# Patient Record
Sex: Male | Born: 1945 | Race: White | Hispanic: No | Marital: Married | Smoking: Never smoker
Health system: Southern US, Community
[De-identification: ages and names within clinical notes are randomized; demographics above are authoritative.]

## PROBLEM LIST (undated history)

## (undated) DIAGNOSIS — G4733 Obstructive sleep apnea (adult) (pediatric): Secondary | ICD-10-CM

## (undated) DIAGNOSIS — I2510 Atherosclerotic heart disease of native coronary artery without angina pectoris: Secondary | ICD-10-CM

## (undated) DIAGNOSIS — Z87442 Personal history of urinary calculi: Secondary | ICD-10-CM

## (undated) DIAGNOSIS — R350 Frequency of micturition: Secondary | ICD-10-CM

## (undated) DIAGNOSIS — R7303 Prediabetes: Secondary | ICD-10-CM

## (undated) DIAGNOSIS — H9190 Unspecified hearing loss, unspecified ear: Secondary | ICD-10-CM

## (undated) DIAGNOSIS — IMO0001 Reserved for inherently not codable concepts without codable children: Secondary | ICD-10-CM

## (undated) DIAGNOSIS — M1990 Unspecified osteoarthritis, unspecified site: Secondary | ICD-10-CM

## (undated) DIAGNOSIS — Z860100 Personal history of colon polyps, unspecified: Secondary | ICD-10-CM

## (undated) DIAGNOSIS — R2681 Unsteadiness on feet: Secondary | ICD-10-CM

## (undated) DIAGNOSIS — I499 Cardiac arrhythmia, unspecified: Secondary | ICD-10-CM

## (undated) DIAGNOSIS — M51369 Other intervertebral disc degeneration, lumbar region without mention of lumbar back pain or lower extremity pain: Secondary | ICD-10-CM

## (undated) DIAGNOSIS — I10 Essential (primary) hypertension: Secondary | ICD-10-CM

## (undated) DIAGNOSIS — H269 Unspecified cataract: Secondary | ICD-10-CM

## (undated) DIAGNOSIS — R351 Nocturia: Secondary | ICD-10-CM

## (undated) DIAGNOSIS — R011 Cardiac murmur, unspecified: Secondary | ICD-10-CM

## (undated) DIAGNOSIS — K8050 Calculus of bile duct without cholangitis or cholecystitis without obstruction: Secondary | ICD-10-CM

## (undated) DIAGNOSIS — K219 Gastro-esophageal reflux disease without esophagitis: Secondary | ICD-10-CM

## (undated) DIAGNOSIS — K8020 Calculus of gallbladder without cholecystitis without obstruction: Secondary | ICD-10-CM

## (undated) DIAGNOSIS — I519 Heart disease, unspecified: Secondary | ICD-10-CM

## (undated) DIAGNOSIS — C801 Malignant (primary) neoplasm, unspecified: Secondary | ICD-10-CM

## (undated) DIAGNOSIS — M5136 Other intervertebral disc degeneration, lumbar region: Secondary | ICD-10-CM

## (undated) DIAGNOSIS — K831 Obstruction of bile duct: Secondary | ICD-10-CM

## (undated) DIAGNOSIS — Z9989 Dependence on other enabling machines and devices: Secondary | ICD-10-CM

## (undated) HISTORY — DX: Obstruction of bile duct: K83.1

## (undated) HISTORY — PX: JOINT REPLACEMENT: SHX530

## (undated) HISTORY — DX: Calculus of bile duct without cholangitis or cholecystitis without obstruction: K80.50

## (undated) HISTORY — DX: Personal history of colon polyps, unspecified: Z86.0100

## (undated) HISTORY — DX: Unsteadiness on feet: R26.81

## (undated) HISTORY — PX: OTHER SURGICAL HISTORY: SHX169

## (undated) HISTORY — PX: CARDIOVASCULAR STRESS TEST: SHX262

## (undated) HISTORY — DX: Personal history of colonic polyps: Z86.010

---
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
## 1970-02-02 HISTORY — PX: APPENDECTOMY: SHX54

## 1990-02-02 HISTORY — PX: OTHER SURGICAL HISTORY: SHX169

## 2001-02-02 HISTORY — PX: INGUINAL HERNIA REPAIR: SUR1180

## 2002-05-19 ENCOUNTER — Ambulatory Visit (HOSPITAL_COMMUNITY): Admission: RE | Admit: 2002-05-19 | Discharge: 2002-05-19 | Payer: Self-pay | Admitting: Gastroenterology

## 2003-02-03 HISTORY — PX: CYSTOSCOPY: SUR368

## 2004-02-03 HISTORY — PX: CERVICAL FUSION: SHX112

## 2007-01-13 ENCOUNTER — Ambulatory Visit (HOSPITAL_BASED_OUTPATIENT_CLINIC_OR_DEPARTMENT_OTHER): Admission: RE | Admit: 2007-01-13 | Discharge: 2007-01-13 | Payer: Self-pay | Admitting: Urology

## 2007-01-13 HISTORY — PX: OTHER SURGICAL HISTORY: SHX169

## 2007-01-17 ENCOUNTER — Ambulatory Visit (HOSPITAL_COMMUNITY): Admission: RE | Admit: 2007-01-17 | Discharge: 2007-01-17 | Payer: Self-pay | Admitting: Urology

## 2007-01-17 HISTORY — PX: EXTRACORPOREAL SHOCK WAVE LITHOTRIPSY: SHX1557

## 2007-02-14 ENCOUNTER — Ambulatory Visit (HOSPITAL_COMMUNITY): Admission: RE | Admit: 2007-02-14 | Discharge: 2007-02-14 | Payer: Self-pay | Admitting: Urology

## 2010-02-02 HISTORY — PX: NASAL SINUS SURGERY: SHX719

## 2010-06-17 NOTE — Op Note (Signed)
Spencer Cochran, Spencer Cochran              ACCOUNT NO.:  [id number]   MEDICAL RECORD NO.:  [id number]          PATIENT TYPE:  AMB   LOCATION:  DAY                          FACILITY:  [facility]   PHYSICIAN:  Sigmund I. Patsi Sears, M.D.DATE OF BIRTH:  08/14/1945   DATE OF PROCEDURE:  01/13/2007  DATE OF DISCHARGE:                               OPERATIVE REPORT   PREOPERATIVE DIAGNOSES:  1. Gross hematuria.  2. Dense urethral stricture.  3. Bladder neck elevation.   SURGEON:  Sigmund I. Patsi Sears, M.D.   ANESTHESIA:  General LMA.   OPERATION:  Cystourethroscopy, urethral dilation, transurethral incision  of the prostate.   PREPARATION:  After appropriate pre-anesthesia, the patient was brought  to the operating room and placed on the operating table in dorsal supine  position where general LMA anesthesia was introduced.  He was then  replaced in dorsal lithotomy position where the pubis was prepped with  Betadine solution and draped in the usual fashion.   REVIEW OF HISTORY:  This 65 year old male has a history of gross,  painless hematuria with clot formation.  Cystourethroscopy was  impossible because of the patient's dense urethral stricture.  He is  status post hypospadias repair as a child and has split meatus, with  submeatal stricture as well.  He is now for urethral dilation,  cystoscopy and incision of stricture.   DESCRIPTION OF PROCEDURE:  Inspection of the penis reveals that the  patient has split meatus as noted above.  There is definite stricture,  and I am unable to get a cystoscope into the strictured area.  Therefore, Sissy Hoff sounds are used to dilate the submeatal stricture  to a size 24 [place].  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  I am unable to traverse the bladder  neck into the bladder.  Therefore, a guidewire is passed through the  scope and I am  able to manipulate the wire into the bladder.  The scope  is removed and the Haymann dilators were then used to dilate the urethra  to a size 26 [place].  The 24 resectoscope was then placed in the bladder  and cystoscopy reveals no evidence of bladder stone or tumor, however,  there is markedly elevated prostate bladder neck.  This is in  association with bilobar BPH.  It was felt that the patient will  probably need to have TURP, but I will try bladder neck incision today,  and this is accomplished from the trigone to the veru.  Bleeding is  electrocoagulated and bladder is irrigated.  A  guidewire is removed.  A 22 catheter with 33 mL balloon is then placed  in the bladder with mild traction.  The patient was awakened and taken  to the recovery room in good condition.  I will discuss possible  complete TURP with the patient.      Sigmund I. Patsi Sears, M.D.  Electronically Signed     SIT/MEDQ  D:  01/13/2007  T:  01/13/2007  Job:  [id number]   cc:   Raynelle Jan, M.D.  Fax: [phone]

## 2010-06-20 NOTE — Op Note (Signed)
NAME:  Spencer Cochran, Spencer Cochran                          ACCOUNT NO.:  [id number]   MEDICAL RECORD NO.:  [id number]                   PATIENT TYPE:  AMB   LOCATION:  ENDO                                 FACILITY:  [facility]   PHYSICIAN:  Anselmo Rod, M.D.               DATE OF BIRTH:  03-16-1945   DATE OF PROCEDURE:  05/19/2002  DATE OF DISCHARGE:                                 OPERATIVE REPORT   PROCEDURE:  Colonoscopy with snare polypectomy x1.   ENDOSCOPIST:  Anselmo Rod, M.D.   INSTRUMENT USED:  Olympus video colonoscope.   INDICATION FOR PROCEDURE:  A 65 year old white male with a family history of  colon cancer in a maternal aunt and a personal history of adenomatous polyps  removed in the past, undergoing screening colonoscopy to rule out recurrent  polyps.   PREPROCEDURE PREPARATION:  Informed consent was procured from the patient.  The patient was fasted for eight hours prior to the procedure and prepped  with a bottle of magnesium citrate and a gallon of GoLYTELY the night prior  to the procedure.   PREPROCEDURE PHYSICAL:  VITAL SIGNS:  The patient had stable vital signs.  NECK:  Supple.  CHEST:  Clear to auscultation.  S1, S2 regular.  ABDOMEN:  Soft with normal bowel sounds.   DESCRIPTION OF PROCEDURE:  The patient was placed in the left lateral  decubitus position and sedated with 100 mg of Demerol and 10 mg of Versed  intravenously.  Once the patient was adequately sedate and maintained on low-  flow oxygen and continuous cardiac monitoring, the Olympus video colonoscope  was advanced from the rectum to the cecum with slight difficulty.  There was  some residual stool in the colon.  Multiple washes were done.  There was  scattered diverticulosis throughout the colon.  A small sessile polyp was  snared from 40 cm.  Small internal hemorrhoids were seen on retroflexion,  and no large masses were appreciated.  The appendiceal orifice and the  ileocecal valve were  clearly visualized and photographed.   IMPRESSION:  1. Scattered diverticulosis.  2. A small sessile polyp snared from 40 cm (polyp lost in stool).  3. Small, nonbleeding internal hemorrhoids.   RECOMMENDATIONS:  1. A high-fiber diet with liberal fluid intake has been advocated, 20-25 g     of fiber have been recommended in the diet along with liberal fluid     intake.  2. Repeat colorectal cancer screening is recommended in the next three years     unless the patient develops any     abnormal symptoms in the interim.  3. Avoid all nonsteroidals, including aspirin, for the next three to four     weeks.  4. Outpatient follow-up on an outpatient basis.  Anselmo Rod, M.D.    JNM/MEDQ  D:  05/19/2002  T:  05/19/2002  Job:  [id number]   cc:   Charlott Rakes, MD, [place], [place]

## 2010-10-23 LAB — BASIC METABOLIC PANEL
BUN: 13
Calcium: 9.2
Chloride: 107
Creatinine, Ser: 0.85
Potassium: 3.7
Sodium: 142

## 2010-11-10 LAB — POCT I-STAT 4, (NA,K, GLUC, HGB,HCT)
HCT: 45
Hemoglobin: 15.3
Operator id: 268271
Potassium: 3.7

## 2011-02-04 DIAGNOSIS — J320 Chronic maxillary sinusitis: Secondary | ICD-10-CM | POA: Diagnosis not present

## 2011-02-11 DIAGNOSIS — L821 Other seborrheic keratosis: Secondary | ICD-10-CM | POA: Diagnosis not present

## 2011-02-18 DIAGNOSIS — J320 Chronic maxillary sinusitis: Secondary | ICD-10-CM | POA: Diagnosis not present

## 2011-03-09 DIAGNOSIS — I10 Essential (primary) hypertension: Secondary | ICD-10-CM | POA: Diagnosis not present

## 2011-03-09 DIAGNOSIS — R0602 Shortness of breath: Secondary | ICD-10-CM | POA: Diagnosis not present

## 2011-03-09 DIAGNOSIS — E785 Hyperlipidemia, unspecified: Secondary | ICD-10-CM | POA: Diagnosis not present

## 2011-03-09 DIAGNOSIS — I2510 Atherosclerotic heart disease of native coronary artery without angina pectoris: Secondary | ICD-10-CM | POA: Diagnosis not present

## 2011-03-23 DIAGNOSIS — R21 Rash and other nonspecific skin eruption: Secondary | ICD-10-CM | POA: Diagnosis not present

## 2011-03-31 DIAGNOSIS — I2510 Atherosclerotic heart disease of native coronary artery without angina pectoris: Secondary | ICD-10-CM | POA: Diagnosis not present

## 2011-03-31 DIAGNOSIS — R42 Dizziness and giddiness: Secondary | ICD-10-CM | POA: Diagnosis not present

## 2011-03-31 HISTORY — PX: TRANSTHORACIC ECHOCARDIOGRAM: SHX275

## 2011-04-01 DIAGNOSIS — G4730 Sleep apnea, unspecified: Secondary | ICD-10-CM | POA: Diagnosis not present

## 2011-04-03 DIAGNOSIS — N529 Male erectile dysfunction, unspecified: Secondary | ICD-10-CM | POA: Diagnosis not present

## 2011-04-03 DIAGNOSIS — R972 Elevated prostate specific antigen [PSA]: Secondary | ICD-10-CM | POA: Diagnosis not present

## 2011-04-03 DIAGNOSIS — E291 Testicular hypofunction: Secondary | ICD-10-CM | POA: Diagnosis not present

## 2011-04-03 DIAGNOSIS — R3129 Other microscopic hematuria: Secondary | ICD-10-CM | POA: Diagnosis not present

## 2011-04-07 DIAGNOSIS — K8020 Calculus of gallbladder without cholecystitis without obstruction: Secondary | ICD-10-CM | POA: Diagnosis not present

## 2011-04-07 DIAGNOSIS — R3129 Other microscopic hematuria: Secondary | ICD-10-CM | POA: Diagnosis not present

## 2011-04-07 DIAGNOSIS — N200 Calculus of kidney: Secondary | ICD-10-CM | POA: Diagnosis not present

## 2011-04-29 DIAGNOSIS — I2510 Atherosclerotic heart disease of native coronary artery without angina pectoris: Secondary | ICD-10-CM | POA: Diagnosis not present

## 2011-04-29 DIAGNOSIS — E785 Hyperlipidemia, unspecified: Secondary | ICD-10-CM | POA: Diagnosis not present

## 2011-04-29 DIAGNOSIS — R0602 Shortness of breath: Secondary | ICD-10-CM | POA: Diagnosis not present

## 2011-04-29 DIAGNOSIS — I10 Essential (primary) hypertension: Secondary | ICD-10-CM | POA: Diagnosis not present

## 2011-05-12 DIAGNOSIS — H35039 Hypertensive retinopathy, unspecified eye: Secondary | ICD-10-CM | POA: Diagnosis not present

## 2011-05-12 DIAGNOSIS — H43819 Vitreous degeneration, unspecified eye: Secondary | ICD-10-CM | POA: Diagnosis not present

## 2011-05-12 DIAGNOSIS — H2510 Age-related nuclear cataract, unspecified eye: Secondary | ICD-10-CM | POA: Diagnosis not present

## 2011-05-12 DIAGNOSIS — H4011X0 Primary open-angle glaucoma, stage unspecified: Secondary | ICD-10-CM | POA: Diagnosis not present

## 2011-05-18 DIAGNOSIS — Z0000 Encounter for general adult medical examination without abnormal findings: Secondary | ICD-10-CM | POA: Diagnosis not present

## 2011-05-29 DIAGNOSIS — R3129 Other microscopic hematuria: Secondary | ICD-10-CM | POA: Diagnosis not present

## 2011-05-29 DIAGNOSIS — N35919 Unspecified urethral stricture, male, unspecified site: Secondary | ICD-10-CM | POA: Diagnosis not present

## 2011-05-29 DIAGNOSIS — N200 Calculus of kidney: Secondary | ICD-10-CM | POA: Diagnosis not present

## 2011-05-29 DIAGNOSIS — M549 Dorsalgia, unspecified: Secondary | ICD-10-CM | POA: Diagnosis not present

## 2011-06-01 DIAGNOSIS — G4733 Obstructive sleep apnea (adult) (pediatric): Secondary | ICD-10-CM | POA: Diagnosis not present

## 2011-06-01 DIAGNOSIS — M159 Polyosteoarthritis, unspecified: Secondary | ICD-10-CM | POA: Diagnosis not present

## 2011-06-02 ENCOUNTER — Other Ambulatory Visit: Payer: Self-pay | Admitting: Urology

## 2011-06-02 DIAGNOSIS — E291 Testicular hypofunction: Secondary | ICD-10-CM | POA: Diagnosis not present

## 2011-06-09 ENCOUNTER — Encounter (HOSPITAL_BASED_OUTPATIENT_CLINIC_OR_DEPARTMENT_OTHER): Payer: Self-pay | Admitting: *Deleted

## 2011-06-10 ENCOUNTER — Encounter (HOSPITAL_BASED_OUTPATIENT_CLINIC_OR_DEPARTMENT_OTHER): Payer: Self-pay | Admitting: *Deleted

## 2011-06-10 NOTE — Progress Notes (Signed)
NPO AFTER MN. ARRIVES AT 0845. NEEDS ISTAT. CURRENT EKG, LAST NOTE , ECHO, AND STRESS TEST TO BE FAXED FROM DR Bing Matter [phone]). WILL TAKE LIPITOR, PRILOSEC, AND COREG AM OF SURG. W/ SIP OF WATER.

## 2011-06-12 ENCOUNTER — Encounter (HOSPITAL_BASED_OUTPATIENT_CLINIC_OR_DEPARTMENT_OTHER): Payer: Self-pay | Admitting: *Deleted

## 2011-06-12 ENCOUNTER — Encounter (HOSPITAL_BASED_OUTPATIENT_CLINIC_OR_DEPARTMENT_OTHER)
Admission: RE | Disposition: A | Discharge status: Home / Self Care | Payer: Self-pay | Source: Ambulatory Visit | Attending: Urology

## 2011-06-12 ENCOUNTER — Encounter (HOSPITAL_BASED_OUTPATIENT_CLINIC_OR_DEPARTMENT_OTHER): Payer: Self-pay | Admitting: Anesthesiology

## 2011-06-12 ENCOUNTER — Ambulatory Visit (HOSPITAL_BASED_OUTPATIENT_CLINIC_OR_DEPARTMENT_OTHER)
Admission: RE | Admit: 2011-06-12 | Discharge: 2011-06-12 | Disposition: A | Discharge status: Home / Self Care | Payer: Medicare Other | Source: Ambulatory Visit | Attending: Urology | Admitting: Urology

## 2011-06-12 ENCOUNTER — Ambulatory Visit (HOSPITAL_BASED_OUTPATIENT_CLINIC_OR_DEPARTMENT_OTHER): Payer: Medicare Other | Admitting: Anesthesiology

## 2011-06-12 DIAGNOSIS — Z7982 Long term (current) use of aspirin: Secondary | ICD-10-CM | POA: Insufficient documentation

## 2011-06-12 DIAGNOSIS — E7800 Pure hypercholesterolemia, unspecified: Secondary | ICD-10-CM | POA: Diagnosis not present

## 2011-06-12 DIAGNOSIS — I10 Essential (primary) hypertension: Secondary | ICD-10-CM | POA: Diagnosis not present

## 2011-06-12 DIAGNOSIS — N35919 Unspecified urethral stricture, male, unspecified site: Secondary | ICD-10-CM | POA: Diagnosis not present

## 2011-06-12 DIAGNOSIS — Z79899 Other long term (current) drug therapy: Secondary | ICD-10-CM | POA: Diagnosis not present

## 2011-06-12 DIAGNOSIS — G4730 Sleep apnea, unspecified: Secondary | ICD-10-CM | POA: Diagnosis not present

## 2011-06-12 DIAGNOSIS — R3129 Other microscopic hematuria: Secondary | ICD-10-CM | POA: Insufficient documentation

## 2011-06-12 DIAGNOSIS — E669 Obesity, unspecified: Secondary | ICD-10-CM | POA: Diagnosis not present

## 2011-06-12 DIAGNOSIS — Z87442 Personal history of urinary calculi: Secondary | ICD-10-CM | POA: Insufficient documentation

## 2011-06-12 HISTORY — DX: Frequency of micturition: R35.0

## 2011-06-12 HISTORY — DX: Unspecified cataract: H26.9

## 2011-06-12 HISTORY — DX: Nocturia: R35.1

## 2011-06-12 HISTORY — DX: Dependence on other enabling machines and devices: Z99.89

## 2011-06-12 HISTORY — DX: Reserved for inherently not codable concepts without codable children: IMO0001

## 2011-06-12 HISTORY — DX: Gastro-esophageal reflux disease without esophagitis: K21.9

## 2011-06-12 HISTORY — PX: CYSTOSCOPY W/ RETROGRADES: SHX1426

## 2011-06-12 HISTORY — DX: Essential (primary) hypertension: I10

## 2011-06-12 HISTORY — DX: Unspecified osteoarthritis, unspecified site: M19.90

## 2011-06-12 HISTORY — DX: Other intervertebral disc degeneration, lumbar region: M51.36

## 2011-06-12 HISTORY — DX: Atherosclerotic heart disease of native coronary artery without angina pectoris: I25.10

## 2011-06-12 HISTORY — DX: Calculus of gallbladder without cholecystitis without obstruction: K80.20

## 2011-06-12 HISTORY — DX: Obstructive sleep apnea (adult) (pediatric): G47.33

## 2011-06-12 HISTORY — DX: Heart disease, unspecified: I51.9

## 2011-06-12 HISTORY — DX: Other intervertebral disc degeneration, lumbar region without mention of lumbar back pain or lower extremity pain: M51.369

## 2011-06-12 HISTORY — DX: Cardiac murmur, unspecified: R01.1

## 2011-06-12 HISTORY — DX: Unspecified hearing loss, unspecified ear: H91.90

## 2011-06-12 LAB — POCT I-STAT 4, (NA,K, GLUC, HGB,HCT)
Hemoglobin: 13.6 g/dL (ref 13.0–17.0)
Potassium: 3.8 mEq/L (ref 3.5–5.1)
Sodium: 148 mEq/L — ABNORMAL HIGH (ref 135–145)

## 2011-06-12 SURGERY — CYSTOSCOPY, WITH RETROGRADE PYELOGRAM
Anesthesia: General | Site: Urethra | Wound class: Clean Contaminated

## 2011-06-12 MED ORDER — SODIUM CHLORIDE 0.9 % IR SOLN
Status: DC | PRN
  Administered 2011-06-12: 3000 mL

## 2011-06-12 MED ORDER — TRAMADOL-ACETAMINOPHEN 37.5-325 MG PO TABS: 1.0000 | ORAL_TABLET | Freq: Four times a day (QID) | ORAL | Status: AC | PRN

## 2011-06-12 MED ORDER — ACETAMINOPHEN 10 MG/ML IV SOLN
INTRAVENOUS | Status: DC | PRN
  Administered 2011-06-12: 1000 mg via INTRAVENOUS

## 2011-06-12 MED ORDER — ONDANSETRON HCL 4 MG/2ML IJ SOLN
INTRAMUSCULAR | Status: DC | PRN
  Administered 2011-06-12: 4 mg via INTRAVENOUS

## 2011-06-12 MED ORDER — MEPERIDINE HCL 25 MG/ML IJ SOLN: 6.2500 mg | INTRAMUSCULAR | Status: DC | PRN

## 2011-06-12 MED ORDER — LIDOCAINE HCL (CARDIAC) 20 MG/ML IV SOLN
INTRAVENOUS | Status: DC | PRN
  Administered 2011-06-12: 80 mg via INTRAVENOUS

## 2011-06-12 MED ORDER — PHENAZOPYRIDINE HCL 200 MG PO TABS: 200.0000 mg | ORAL_TABLET | Freq: Three times a day (TID) | ORAL | Status: AC | PRN

## 2011-06-12 MED ORDER — LACTATED RINGERS IV SOLN: INTRAVENOUS | Status: DC

## 2011-06-12 MED ORDER — FENTANYL CITRATE 0.05 MG/ML IJ SOLN: 25.0000 ug | INTRAMUSCULAR | Status: DC | PRN

## 2011-06-12 MED ORDER — PROMETHAZINE HCL 25 MG/ML IJ SOLN: 6.2500 mg | INTRAMUSCULAR | Status: DC | PRN

## 2011-06-12 MED ORDER — IOHEXOL 350 MG/ML SOLN
INTRAVENOUS | Status: DC | PRN
  Administered 2011-06-12: 50 mL

## 2011-06-12 MED ORDER — PHENAZOPYRIDINE HCL 200 MG PO TABS
200.0000 mg | ORAL_TABLET | Freq: Three times a day (TID) | ORAL | Status: DC
  Administered 2011-06-12: 200 mg via ORAL

## 2011-06-12 MED ORDER — CEFAZOLIN SODIUM-DEXTROSE 2-3 GM-% IV SOLR
2.0000 g | INTRAVENOUS | Status: AC
  Administered 2011-06-12: 2 g via INTRAVENOUS

## 2011-06-12 MED ORDER — FENTANYL CITRATE 0.05 MG/ML IJ SOLN
INTRAMUSCULAR | Status: DC | PRN
  Administered 2011-06-12 (×2): 25 ug via INTRAVENOUS
  Administered 2011-06-12: 50 ug via INTRAVENOUS

## 2011-06-12 MED ORDER — PROPOFOL 10 MG/ML IV EMUL
INTRAVENOUS | Status: DC | PRN
  Administered 2011-06-12: 300 mg via INTRAVENOUS

## 2011-06-12 MED ORDER — LACTATED RINGERS IV SOLN
INTRAVENOUS | Status: DC
  Administered 2011-06-12 (×3): via INTRAVENOUS

## 2011-06-12 MED ORDER — LIDOCAINE HCL 2 % EX GEL
CUTANEOUS | Status: DC | PRN
  Administered 2011-06-12: 1 via URETHRAL

## 2011-06-12 MED ORDER — CEFAZOLIN SODIUM 1-5 GM-% IV SOLN: 1.0000 g | INTRAVENOUS | Status: DC

## 2011-06-12 SURGICAL SUPPLY — 20 items
ADAPTER CATH URET PLST 4-6FR (CATHETERS) ×2 IMPLANT
BAG DRAIN URO-CYSTO SKYTR STRL (DRAIN) ×2 IMPLANT
BOOTIES KNEE HIGH SLOAN (MISCELLANEOUS) ×2 IMPLANT
CANISTER SUCT LVC 12 LTR MEDI- (MISCELLANEOUS) ×2 IMPLANT
CATH INTERMIT  6FR 70CM (CATHETERS) ×2 IMPLANT
CLOTH BEACON ORANGE TIMEOUT ST (SAFETY) ×2 IMPLANT
DRAPE CAMERA CLOSED 9X96 (DRAPES) ×2 IMPLANT
GLOVE BIO SURGEON STRL SZ7.5 (GLOVE) ×2 IMPLANT
GLOVE BIOGEL PI IND STRL 7.0 (GLOVE) ×1 IMPLANT
GLOVE BIOGEL PI INDICATOR 7.0 (GLOVE) ×1
GLOVE ECLIPSE 7.0 STRL STRAW (GLOVE) ×2 IMPLANT
GOWN STRL REIN XL XLG (GOWN DISPOSABLE) ×2 IMPLANT
GOWN SURGICAL LARGE (GOWNS) ×2 IMPLANT
GOWN XL W/COTTON TOWEL STD (GOWNS) ×2 IMPLANT
GUIDEWIRE 0.038 PTFE COATED (WIRE) IMPLANT
GUIDEWIRE ANG ZIPWIRE 038X150 (WIRE) IMPLANT
GUIDEWIRE STR DUAL SENSOR (WIRE) ×2 IMPLANT
NS IRRIG 500ML POUR BTL (IV SOLUTION) IMPLANT
PACK CYSTOSCOPY (CUSTOM PROCEDURE TRAY) ×2 IMPLANT
WATER STERILE IRR 3000ML UROMA (IV SOLUTION) ×2 IMPLANT

## 2011-06-12 NOTE — Discharge Instructions (Addendum)
  Post Anesthesia Home Care Instructions  Activity: Get plenty of rest for the remainder of the day. A responsible adult should stay with you for 24 hours following the procedure.  For the next 24 hours, DO NOT: -Drive a car -Advertising copywriter -Drink alcoholic beverages -Take any medication unless instructed by your physician -Make any legal decisions or sign important papers.  Meals: Start with liquid foods such as gelatin or soup. Progress to regular foods as tolerated. Avoid greasy, spicy, heavy foods. If nausea and/or vomiting occur, drink only clear liquids until the nausea and/or vomiting subsides. Call your physician if vomiting continues.  Special Instructions/Symptoms: Your throat may feel dry or sore from the anesthesia or the breathing tube placed in your throat during surgery. If this causes discomfort, gargle with warm salt water. The discomfort should disappear within 24 hours.  Meatal Stenosis Meatal stenosis is a condition where the opening (meatus) on the end of the penis is too small (stenosis). Even if your child has no present symptoms, it is necessary to correct this problem. Over a period of time, the kidneys can be injured from back pressure if this is not corrected. The surgery to correct this is a simple procedure. It is often done in the caregiver's office. The surgery consists of making a tiny incision (cut by the surgeon) on the bottom of the tip of the penis. This is associated with a very little momentary discomfort, but little post-operative pain.  HOME CARE INSTRUCTIONS  Following the procedure, put petroleum jelly on the incision four times per day. The incision should be gently pulled apart. This is absolutely necessary so the incision can not grow back together again. This may cause a very minimal discomfort, so do not be alarmed if your baby cries. Only give your child over-the-counter or prescription medicines for pain, discomfort, or fever as directed by their  caregiver.  SEEK IMMEDIATE MEDICAL CARE IF:   The penis becomes red, swollen, or has a purulent (pus like) drainage.   There is continued bleeding that cannot be stopped with gentle pressure.   Your baby runs an unexplained temperature.   Your baby does not wet his or her diapers the same as always.  Document Released: 01/17/2000 Document Revised: 01/08/2011 Document Reviewed: 12/02/2007 [facility] Patient Information 2012 [place], [place].

## 2011-06-12 NOTE — Progress Notes (Signed)
Dr Patsi Sears paged regarding continued bleeding from meatus-voids well (250-200 cc) each time.States expected due to dilation-OK to d/c home.

## 2011-06-12 NOTE — Anesthesia Procedure Notes (Signed)
Procedure Name: LMA Insertion Date/Time: 06/12/2011 10:53 AM Performed by: Fran Lowes Pre-anesthesia Checklist: Patient identified, Emergency Drugs available, Suction available and Patient being monitored Patient Re-evaluated:Patient Re-evaluated prior to inductionOxygen Delivery Method: Circle System Utilized Preoxygenation: Pre-oxygenation with 100% oxygen Intubation Type: IV induction Ventilation: Mask ventilation without difficulty LMA: LMA inserted LMA Size: 5.0 Number of attempts: 3 Airway Equipment and Method: bite block Placement Confirmation: positive ETCO2 Tube secured with: Tape Dental Injury: Teeth and Oropharynx as per pre-operative assessment  Comments: 1st LMA Supreme #4, 2nd reg #4 LMA, 3rd. #5 reg LMA

## 2011-06-12 NOTE — H&P (Signed)
Active Problems Problems  1. Microscopic Hematuria 599.72 2. Multiple Renal Cysts 593.2 3. Nephrolithiasis 592.0  History of Present Illness        Mr. Spencer Cochran, 66 WM returns today for cystoscopy for hx of microhematuria with a hx of kidney stones, ED, hypogonadism, and elevated PSA. Has spontanously passed numerous stones over past. Denies fever/chills or nausea/vomiting. Patient is post-op cystoscopy, uretheral dilatation and TUIP 01/12/07. He is having ED, with partial erections. He is on Testim 1% with better energy. He had side effect with Viagra ( blue vision and headaches, and Levitra-severe headaches).  04/03/11  CT hematuria protocol - bilateral kidney stones  04/03/11 labs:  PSA - 3.97, Testosterone - 368.19, Creatinine - 0.89 and GFR - 89.  12/18/09 labs:  PSA - 2.90 and Testosterone - 277.39     11/15/08 labs:  PSA 3.20 and Vit D  49.5   Past Medical History Problems  1. History of  Acute Bacterial Prostatitis 601.0 2. History of  Acute Cystitis 595.0 3. History of  Adult Sleep Apnea 780.57 4. History of  Arthritis V13.4 5. History of  Dysuria 788.1 6. 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 History of  Hypercholesterolemia 272.0 8. History of  Hypertension 401.9 9. History of  Murmurs 785.2  Surgical History Problems  1. History of  Appendectomy 2. History of  Cystoscopy (Diagnostic) 3. History of  Inguinal Hernia Repair 4. History of  Lithotripsy Left 5. History of  Primary Repair Of Ruptured Achilles Tendon Left 6. History of  Transurethral Incision Of Prostate 7. History of  Urethra Surgery  Current Meds 1. Aspirin 81 MG Oral Tablet; Therapy: (Recorded:06May2008) to 2. Carvedilol 25 MG Oral Tablet; Therapy: (Recorded:01Mar2013) to 3. Fish Oil CAPS; Therapy: (Recorded:14Oct2010) to 4. Furosemide 40 MG Oral Tablet; Therapy: (Recorded:16Nov2011) to 5. Levitra 20 MG Oral Tablet; Take as directed; Therapy: 26Oct2009 to (Evaluate:16Feb2009); Last  Rx:26Oct2009 6.  Lipitor 40 MG Oral Tablet; Therapy: (Recorded:01Mar2013) to 7. Lotrel 5-40 MG Oral Capsule; Therapy: (Recorded:01Mar2013) to 8. Multi-Vitamin Oral Tablet; Therapy: (Recorded:06May2008) to 9. Omeprazole 20 MG Oral Capsule Delayed Release; Therapy: (Recorded:18Dec2007) to 10. Vitamin C 500 MG Oral Tablet; Therapy: (Recorded:01Mar2013) to 11. Vitamin D 11914 UNIT CAPS; TAKE 1 CAPSULE WEEKLY; Therapy: 18Aug2009 to (Last   Rx:18Aug2009)  Allergies Medication  1. No Known Drug Allergies  Family History Problems  1. Paternal history of  Aneurysm Of The Abdominal Aorta 2. Paternal history of  Bladder Cancer V16.52 3. Family history of  Family Health Status Number Of Children 1 son, 1 daughter 4. Paternal history of  Renal Failure 5. Paternal history of  Urologic Disorder V18.7 Blood in urine  Social History Problems  1. Family history of  Death In The Family Father Age 56 2. Family history of  Death In The Family Mother Liver cancer 3. Marital History - Currently Married 4. Never A Smoker 5. Occupation: Public relations account executive Denied  6. Alcohol Use 7. Caffeine Use 8. Tobacco Use V15.82  Review of Systems Genitourinary, constitutional, skin, eye, otolaryngeal, hematologic/lymphatic, cardiovascular, pulmonary, endocrine, musculoskeletal, gastrointestinal, neurological and psychiatric system(s) were reviewed and pertinent findings if present are noted.  Genitourinary: urinary frequency, feelings of urinary urgency, nocturia, weak urinary stream, incomplete emptying of bladder and hematuria.  ENT: sinus problems.    Vitals Vital Signs [Data Includes: Last 1 Day]  26Apr2013 03:58PM  Blood Pressure: 124 / 73 Temperature: 98.1 F Heart Rate: 73  Results/Data Urine [Data Includes: Last 1 Day]   26Apr2013  COLOR YELLOW  APPEARANCE CLEAR   SPECIFIC GRAVITY 1.020   pH 6.0   GLUCOSE NEG mg/dL  BILIRUBIN NEG   KETONE NEG mg/dL  BLOOD TRACE   PROTEIN NEG mg/dL  UROBILINOGEN 0.2  mg/dL  NITRITE NEG   LEUKOCYTE ESTERASE NEG   SQUAMOUS EPITHELIAL/HPF RARE   WBC NONE SEEN WBC/hpf  RBC 0-3 RBC/hpf  BACTERIA NONE SEEN   CRYSTALS NONE SEEN   CASTS NONE SEEN   Selected Results  AU CT-HEMATURIA PROTOCOL 05Mar2013 12:00AM Spencer Cochran   Test Name Result Flag Reference  ** RADIOLOGY REPORT BY [facility] ** ORIGINAL APPROVED BY: Consuello Bossier, M.D. ON: 04/07/2011 14:14:04   *RADIOLOGY REPORT*  Clinical Data: Microscopic hematuria. No abdominal complaints. History renal stones, prior lithotripsy.  CT ABDOMEN AND PELVIS WITHOUT AND WITH CONTRAST  Technique: Multidetector CT imaging of the abdomen and pelvis was performed without contrast material in one or both body regions, followed by contrast material(s) and further sections in one or both body regions.  Contrast: 125 ml Isovue 300  Comparison: Stone study 12/25/2008.  Findings: Unenhanced images demonstrate bilateral renal collecting system calculi. Largest is in the lower pole left kidney and measures 4 mm. Similar stone burden to the 12/25/2008 study.  No hydroureter or ureteric calculi.  Post contrast images demonstrate clear lung bases. Borderline cardiomegaly, with multivessel coronary artery atherosclerosis. Tiny hiatal hernia.  Hepatic dome cyst. Scattered too small to characterize hepatic lesions which are most likely tiny cysts. Remote granulomatous disease in the spleen.  Normal stomach. Fatty replaced pancreas. Duodenal diverticulum. Cholelithiasis without cholecystitis or biliary ductal dilatation. Normal adrenal glands. Bilateral renal cysts. No suspicious renal lesion on portal venous phase imaging.  Delayed images demonstrate moderate bilateral renal collecting system opacification. The right ureter is incompletely opacified, despite 2 attempts. No filling defects are identified.  No retroperitoneal or retrocrural adenopathy.  Scattered colonic diverticula.  Normal terminal ileum. Stable ileocolic mesenteric node on image 42, likely reactive. Small bowel otherwise normal. No ascites. Fat containing left inguinal hernia. No pelvic adenopathy. Normal urinary bladder. Mild prostatomegaly. No significant free fluid. No bladder filling defect on well opacified delayed images. No acute osseous abnormality.  IMPRESSION:  1. Bilateral nonobstructive renal calculi. 2. Otherwise, no explanation for hematuria. 3. Mildly suboptimal opacification of the right ureter on delayed images.  4. Cholelithiasis. 5. Prostatomegaly.   CREATININE with eGFR 01Mar2013 02:07PM Spencer Cochran  SPECIMEN TYPE: BLOOD   Test Name Result Flag Reference  CREATININE 0.89 mg/dL  1.61-0.96  Est GFR, African American >89 mL/min    Est GFR, NonAfrican American 89 mL/min    The estimated GFR is a calculation valid for adults (87 to 67 years old) that uses the CKD-EPI algorithm to adjust for age and sex. It is not to be used for children, pregnant women, hospitalized patients, patients on dialysis, or with rapidly changing kidney function. According to the NKDEP, eGFR >89 is normal, 60-89 shows mild impairment, 30-59 shows moderate impairment, 15-29 shows severe impairment and <15 is ESRD.   PSA REFLEX TO FREE 01Mar2013 02:05PM Spencer Cochran  SPECIMEN TYPE: BLOOD   Test Name Result Flag Reference  PSA 3.97 ng/mL  <=4.00  Test Methodology: ECLIA PSA (Electrochemiluminescence Immunoassay)   Procedure  Procedure: Cystoscopy  Indication: Hematuria. Lower Urinary Tract Symptoms.  Informed Consent: Risks, benefits, and potential adverse events were discussed and informed consent was obtained from the patient.  Prep: The patient was prepped with betadine.  Anesthesia:. Local anesthesia was administered intraurethrally with 2%  lidocaine jelly.  Antibiotic prophylaxis: Ciprofloxacin.  Procedure Note:  Urethral meatus:. A pinpoint stricture was present at the  urethral meatus but was not manipulated.  Unable to complete bladder visulization  Bladder:    Assessment Assessed  1. Backache 724.5 2. Urethral Stricture 598.9 3. Microscopic Hematuria 599.72   Microhematuria, wit epispadius and hx of multiple failed attempts at urethral reconstruction at [facility] [phone]). Had foreskin for graft. Ct shows bilateral nephrolithiasis. Cannot see R ureter. Will need retrograde pyelograms.   Plan Health Maintenance (V70.0)  1. UA With REFLEX  Done: 26Apr2013 03:29PM   cysto and dilationof stricture and Right retrograde pyelogram.   Amendment  RTC post op for follow-up. T  Amended By: Spencer Cochran; 06/03/2011 6:02 PMEST   Signatures Electronically signed by : Spencer Cochran, M.D.; Jun 03 2011  6:04PM

## 2011-06-12 NOTE — Anesthesia Postprocedure Evaluation (Signed)
  Anesthesia Post-op Note  Patient: Spencer Cochran  Procedure(s) Performed: Procedure(s) (LRB): CYSTOSCOPY WITH RETROGRADE PYELOGRAM (N/A)  Patient Location: PACU  Anesthesia Type: General  Level of Consciousness: awake and alert   Airway and Oxygen Therapy: Patient Spontanous Breathing  Post-op Pain: mild  Post-op Assessment: Post-op Vital signs reviewed, Patient's Cardiovascular Status Stable, Respiratory Function Stable, Patent Airway and No signs of Nausea or vomiting  Post-op Vital Signs: stable  Complications: No apparent anesthesia complications

## 2011-06-12 NOTE — Anesthesia Preprocedure Evaluation (Addendum)
Anesthesia Evaluation  Patient identified by MRN, date of birth, ID band Patient awake    Reviewed: Allergy & Precautions, H&P , NPO status , Patient's Chart, lab work & pertinent test results  Airway Mallampati: III TM Distance: >3 FB Neck ROM: Full    Dental No notable dental hx.    Pulmonary neg pulmonary ROS, sleep apnea and Continuous Positive Airway Pressure Ventilation ,  breath sounds clear to auscultation  Pulmonary exam normal       Cardiovascular hypertension, Pt. on medications + CAD (stable) negative cardio ROS  Rhythm:Regular Rate:Normal     Neuro/Psych negative neurological ROS  negative psych ROS   GI/Hepatic negative GI ROS, Neg liver ROS, GERD-  Medicated and Controlled,  Endo/Other  negative endocrine ROS  Renal/GU negative Renal ROS  negative genitourinary   Musculoskeletal negative musculoskeletal ROS (+)   Abdominal   Peds negative pediatric ROS (+)  Hematology negative hematology ROS (+)   Anesthesia Other Findings   Reproductive/Obstetrics negative OB ROS                          Anesthesia Physical Anesthesia Plan  ASA: III  Anesthesia Plan: General   Post-op Pain Management:    Induction: Intravenous  Airway Management Planned: LMA  Additional Equipment:   Intra-op Plan:   Post-operative Plan: Extubation in OR  Informed Consent: I have reviewed the patients History and Physical, chart, labs and discussed the procedure including the risks, benefits and alternatives for the proposed anesthesia with the patient or authorized representative who has indicated his/her understanding and acceptance.   Dental advisory given  Plan Discussed with: CRNA  Anesthesia Plan Comments:         Anesthesia Quick Evaluation

## 2011-06-12 NOTE — Transfer of Care (Signed)
Immediate Anesthesia Transfer of Care Note  Patient: Spencer Cochran  Procedure(s) Performed: Procedure(s) (LRB): CYSTOSCOPY WITH RETROGRADE PYELOGRAM (N/A)  Patient Location: Patient transported to PACU with oxygen via face mask at 4 Liters / Min  Anesthesia Type: General  Level of Consciousness: awake and alert   Airway & Oxygen Therapy: Patient Spontanous Breathing and Patient connected to face mask oxygen  Post-op Assessment: Report given to PACU RN and Post -op Vital signs reviewed and stable  Post vital signs: Reviewed and stable  Dentition: Teeth and oropharynx remain in pre-op condition  Complications: No apparent anesthesia complications

## 2011-06-12 NOTE — Op Note (Signed)
Pre-operative diagnosis :Urethral stricture and microhematuria  Postoperative diagnosis: Same  Operation: Urethral dilation, cystourethroscopy, right retrograde pyelogram with interpretation.  Surgeon:  Kathie Rhodes. Patsi Sears, MD  First assistant: None  Anesthesia:  general  Preparation: After appropriate preanesthesia, the patient was brought to the operating room, and placed upon the operating table in the dorsal supine position where general LMA anesthesia was introduced. He was then replaced in the dorsal lithotomy position with pubis was prepped with Betadine solution, and draped in usual fashion. Armband was rechecked.  Review history: The patient is a 66 year old male with a history of microhematuria. CT was unable to visualize the right ureter. Cystoscopy could not be performed in the office because of meatal urethral stricture. The patient is now for urethral dilation, as well as right retrograde pyelogram.  Statement of  Likelihood of Success: Excellent. TIME-OUT observed.:  Procedure: Examination of the penis under anesthesia shows that the patient has extensive distal urethral disease, with both dorsal and ventral urethral meatal epispadias and hypospadias. However, I was able to place dilators in the urethral meatus, and systematically dilated the urethra from a size 16 to a size 22 [place] with the male sounds. A size 22 French cystoscope lobe was then passed into the bladder, and the patient had some additional scarring at the level of the prostate, which appeared to be by lobar, with some elevation of the bladder neck. The bladder base appeared normal, with clear reflux in the trigone. There was trabeculation noted but no cellules. There was no bladder diverticulum noted. There was no bladder stone or tumor noted. Right retrograde Pyelogram was performed, which showed a normal caliber ureter. There was no evidence of stone or tumor within the ureter. The renal pelvis filled normally. The  bladder was then drained of fluid, and Xylocaine gel was placed within the urethra. The patient had been given IV Tylenol preoperatively. He was awakened, and taken to recovery room in good condition. He will void spontaneously prior to discharge. He desired to have no Foley catheter upon discharge.

## 2011-06-12 NOTE — Interval H&P Note (Signed)
History and Physical Interval Note:  06/12/2011 10:35 AM  Spencer Cochran  has presented today for surgery, with the diagnosis of urethral stricture  The various methods of treatment have been discussed with the patient and family. After consideration of risks, benefits and other options for treatment, the patient has consented to  Procedure(s) (LRB): CYSTOSCOPY WITH RETROGRADE PYELOGRAM (N/A) as a surgical intervention .  The patients' history has been reviewed, patient examined, no change in status, stable for surgery.  I have reviewed the patients' chart and labs.  Questions were answered to the patient's satisfaction.     Jethro Bolus I

## 2011-06-15 ENCOUNTER — Encounter (HOSPITAL_BASED_OUTPATIENT_CLINIC_OR_DEPARTMENT_OTHER): Payer: Self-pay | Admitting: Urology

## 2011-06-18 DIAGNOSIS — N529 Male erectile dysfunction, unspecified: Secondary | ICD-10-CM | POA: Diagnosis not present

## 2011-06-18 DIAGNOSIS — R3129 Other microscopic hematuria: Secondary | ICD-10-CM | POA: Diagnosis not present

## 2011-06-18 DIAGNOSIS — N35919 Unspecified urethral stricture, male, unspecified site: Secondary | ICD-10-CM | POA: Diagnosis not present

## 2011-08-11 DIAGNOSIS — S336XXA Sprain of sacroiliac joint, initial encounter: Secondary | ICD-10-CM | POA: Diagnosis not present

## 2011-09-02 DIAGNOSIS — N529 Male erectile dysfunction, unspecified: Secondary | ICD-10-CM | POA: Diagnosis not present

## 2011-10-13 DIAGNOSIS — R0602 Shortness of breath: Secondary | ICD-10-CM | POA: Diagnosis not present

## 2011-10-13 DIAGNOSIS — I2510 Atherosclerotic heart disease of native coronary artery without angina pectoris: Secondary | ICD-10-CM | POA: Diagnosis not present

## 2011-10-13 DIAGNOSIS — E785 Hyperlipidemia, unspecified: Secondary | ICD-10-CM | POA: Diagnosis not present

## 2011-10-13 DIAGNOSIS — I10 Essential (primary) hypertension: Secondary | ICD-10-CM | POA: Diagnosis not present

## 2011-10-14 DIAGNOSIS — E291 Testicular hypofunction: Secondary | ICD-10-CM | POA: Diagnosis not present

## 2011-10-21 DIAGNOSIS — N529 Male erectile dysfunction, unspecified: Secondary | ICD-10-CM | POA: Diagnosis not present

## 2011-10-21 DIAGNOSIS — N35919 Unspecified urethral stricture, male, unspecified site: Secondary | ICD-10-CM | POA: Diagnosis not present

## 2011-10-21 DIAGNOSIS — E291 Testicular hypofunction: Secondary | ICD-10-CM | POA: Diagnosis not present

## 2011-10-27 DIAGNOSIS — I10 Essential (primary) hypertension: Secondary | ICD-10-CM | POA: Diagnosis not present

## 2011-10-27 DIAGNOSIS — Z79899 Other long term (current) drug therapy: Secondary | ICD-10-CM | POA: Diagnosis not present

## 2011-10-27 DIAGNOSIS — E785 Hyperlipidemia, unspecified: Secondary | ICD-10-CM | POA: Diagnosis not present

## 2011-10-27 DIAGNOSIS — R0602 Shortness of breath: Secondary | ICD-10-CM | POA: Diagnosis not present

## 2011-10-27 DIAGNOSIS — I2510 Atherosclerotic heart disease of native coronary artery without angina pectoris: Secondary | ICD-10-CM | POA: Diagnosis not present

## 2011-12-03 DIAGNOSIS — L732 Hidradenitis suppurativa: Secondary | ICD-10-CM | POA: Diagnosis not present

## 2011-12-03 DIAGNOSIS — H1030 Unspecified acute conjunctivitis, unspecified eye: Secondary | ICD-10-CM | POA: Diagnosis not present

## 2011-12-16 DIAGNOSIS — J3089 Other allergic rhinitis: Secondary | ICD-10-CM | POA: Diagnosis not present

## 2011-12-16 DIAGNOSIS — R05 Cough: Secondary | ICD-10-CM | POA: Diagnosis not present

## 2011-12-16 DIAGNOSIS — R059 Cough, unspecified: Secondary | ICD-10-CM | POA: Diagnosis not present

## 2011-12-28 DIAGNOSIS — L732 Hidradenitis suppurativa: Secondary | ICD-10-CM | POA: Diagnosis not present

## 2012-01-08 DIAGNOSIS — R062 Wheezing: Secondary | ICD-10-CM | POA: Diagnosis not present

## 2012-01-08 DIAGNOSIS — J209 Acute bronchitis, unspecified: Secondary | ICD-10-CM | POA: Diagnosis not present

## 2012-01-12 DIAGNOSIS — Z1211 Encounter for screening for malignant neoplasm of colon: Secondary | ICD-10-CM | POA: Diagnosis not present

## 2012-01-12 DIAGNOSIS — Z86010 Personal history of colonic polyps: Secondary | ICD-10-CM | POA: Diagnosis not present

## 2012-01-15 DIAGNOSIS — R059 Cough, unspecified: Secondary | ICD-10-CM | POA: Diagnosis not present

## 2012-01-15 DIAGNOSIS — R918 Other nonspecific abnormal finding of lung field: Secondary | ICD-10-CM | POA: Diagnosis not present

## 2012-01-15 DIAGNOSIS — R05 Cough: Secondary | ICD-10-CM | POA: Diagnosis not present

## 2012-01-20 DIAGNOSIS — N401 Enlarged prostate with lower urinary tract symptoms: Secondary | ICD-10-CM | POA: Diagnosis not present

## 2012-03-23 DIAGNOSIS — G4733 Obstructive sleep apnea (adult) (pediatric): Secondary | ICD-10-CM | POA: Diagnosis not present

## 2012-03-23 DIAGNOSIS — K644 Residual hemorrhoidal skin tags: Secondary | ICD-10-CM | POA: Diagnosis not present

## 2012-03-23 DIAGNOSIS — K5730 Diverticulosis of large intestine without perforation or abscess without bleeding: Secondary | ICD-10-CM | POA: Diagnosis not present

## 2012-03-23 DIAGNOSIS — Z7982 Long term (current) use of aspirin: Secondary | ICD-10-CM | POA: Diagnosis not present

## 2012-03-23 DIAGNOSIS — Z86010 Personal history of colonic polyps: Secondary | ICD-10-CM | POA: Diagnosis not present

## 2012-03-23 DIAGNOSIS — Z1211 Encounter for screening for malignant neoplasm of colon: Secondary | ICD-10-CM | POA: Diagnosis not present

## 2012-03-23 DIAGNOSIS — I10 Essential (primary) hypertension: Secondary | ICD-10-CM | POA: Diagnosis not present

## 2012-03-23 DIAGNOSIS — E669 Obesity, unspecified: Secondary | ICD-10-CM | POA: Diagnosis not present

## 2012-03-23 DIAGNOSIS — K648 Other hemorrhoids: Secondary | ICD-10-CM | POA: Diagnosis not present

## 2012-03-23 DIAGNOSIS — D126 Benign neoplasm of colon, unspecified: Secondary | ICD-10-CM | POA: Diagnosis not present

## 2012-03-23 DIAGNOSIS — E7800 Pure hypercholesterolemia, unspecified: Secondary | ICD-10-CM | POA: Diagnosis not present

## 2012-03-23 DIAGNOSIS — Z9981 Dependence on supplemental oxygen: Secondary | ICD-10-CM | POA: Diagnosis not present

## 2012-03-23 HISTORY — PX: COLONOSCOPY: SHX174

## 2012-06-06 DIAGNOSIS — Z79899 Other long term (current) drug therapy: Secondary | ICD-10-CM | POA: Diagnosis not present

## 2012-06-06 DIAGNOSIS — I2510 Atherosclerotic heart disease of native coronary artery without angina pectoris: Secondary | ICD-10-CM | POA: Diagnosis not present

## 2012-06-06 DIAGNOSIS — E785 Hyperlipidemia, unspecified: Secondary | ICD-10-CM | POA: Diagnosis not present

## 2012-06-06 DIAGNOSIS — I10 Essential (primary) hypertension: Secondary | ICD-10-CM | POA: Diagnosis not present

## 2012-06-06 DIAGNOSIS — R0602 Shortness of breath: Secondary | ICD-10-CM | POA: Diagnosis not present

## 2012-06-13 DIAGNOSIS — E785 Hyperlipidemia, unspecified: Secondary | ICD-10-CM | POA: Diagnosis not present

## 2012-06-13 DIAGNOSIS — R0602 Shortness of breath: Secondary | ICD-10-CM | POA: Diagnosis not present

## 2012-06-22 DIAGNOSIS — H4010X0 Unspecified open-angle glaucoma, stage unspecified: Secondary | ICD-10-CM | POA: Diagnosis not present

## 2012-06-22 DIAGNOSIS — H5210 Myopia, unspecified eye: Secondary | ICD-10-CM | POA: Diagnosis not present

## 2012-06-22 DIAGNOSIS — H353190 Nonexudative age-related macular degeneration, unspecified eye, stage unspecified: Secondary | ICD-10-CM | POA: Diagnosis not present

## 2012-06-22 DIAGNOSIS — H4011X0 Primary open-angle glaucoma, stage unspecified: Secondary | ICD-10-CM | POA: Diagnosis not present

## 2012-07-22 DIAGNOSIS — J339 Nasal polyp, unspecified: Secondary | ICD-10-CM | POA: Diagnosis not present

## 2012-08-01 DIAGNOSIS — I2510 Atherosclerotic heart disease of native coronary artery without angina pectoris: Secondary | ICD-10-CM | POA: Diagnosis not present

## 2012-08-01 DIAGNOSIS — R0602 Shortness of breath: Secondary | ICD-10-CM | POA: Diagnosis not present

## 2012-08-11 DIAGNOSIS — E785 Hyperlipidemia, unspecified: Secondary | ICD-10-CM | POA: Diagnosis not present

## 2012-08-11 DIAGNOSIS — R0602 Shortness of breath: Secondary | ICD-10-CM | POA: Diagnosis not present

## 2012-08-11 DIAGNOSIS — I2510 Atherosclerotic heart disease of native coronary artery without angina pectoris: Secondary | ICD-10-CM | POA: Diagnosis not present

## 2012-08-11 DIAGNOSIS — R0789 Other chest pain: Secondary | ICD-10-CM | POA: Diagnosis not present

## 2012-08-11 DIAGNOSIS — I10 Essential (primary) hypertension: Secondary | ICD-10-CM | POA: Diagnosis not present

## 2012-08-17 DIAGNOSIS — I209 Angina pectoris, unspecified: Secondary | ICD-10-CM | POA: Diagnosis not present

## 2012-08-17 DIAGNOSIS — I2510 Atherosclerotic heart disease of native coronary artery without angina pectoris: Secondary | ICD-10-CM | POA: Diagnosis not present

## 2012-08-17 DIAGNOSIS — Z7982 Long term (current) use of aspirin: Secondary | ICD-10-CM | POA: Diagnosis not present

## 2012-08-17 DIAGNOSIS — R9439 Abnormal result of other cardiovascular function study: Secondary | ICD-10-CM | POA: Diagnosis not present

## 2012-08-17 DIAGNOSIS — R0602 Shortness of breath: Secondary | ICD-10-CM | POA: Diagnosis not present

## 2012-08-17 DIAGNOSIS — I208 Other forms of angina pectoris: Secondary | ICD-10-CM | POA: Diagnosis not present

## 2012-08-17 DIAGNOSIS — E785 Hyperlipidemia, unspecified: Secondary | ICD-10-CM | POA: Diagnosis not present

## 2012-08-17 DIAGNOSIS — I10 Essential (primary) hypertension: Secondary | ICD-10-CM | POA: Diagnosis not present

## 2012-08-17 DIAGNOSIS — Z79899 Other long term (current) drug therapy: Secondary | ICD-10-CM | POA: Diagnosis not present

## 2012-08-24 DIAGNOSIS — I10 Essential (primary) hypertension: Secondary | ICD-10-CM | POA: Diagnosis not present

## 2012-08-24 DIAGNOSIS — I2510 Atherosclerotic heart disease of native coronary artery without angina pectoris: Secondary | ICD-10-CM | POA: Diagnosis not present

## 2012-08-24 DIAGNOSIS — R0602 Shortness of breath: Secondary | ICD-10-CM | POA: Diagnosis not present

## 2012-08-24 DIAGNOSIS — E785 Hyperlipidemia, unspecified: Secondary | ICD-10-CM | POA: Diagnosis not present

## 2012-08-29 DIAGNOSIS — J329 Chronic sinusitis, unspecified: Secondary | ICD-10-CM | POA: Diagnosis not present

## 2012-09-26 DIAGNOSIS — J329 Chronic sinusitis, unspecified: Secondary | ICD-10-CM | POA: Diagnosis not present

## 2012-09-26 DIAGNOSIS — S43499A Other sprain of unspecified shoulder joint, initial encounter: Secondary | ICD-10-CM | POA: Diagnosis not present

## 2012-10-21 DIAGNOSIS — M5380 Other specified dorsopathies, site unspecified: Secondary | ICD-10-CM | POA: Diagnosis not present

## 2012-12-22 DIAGNOSIS — B372 Candidiasis of skin and nail: Secondary | ICD-10-CM | POA: Diagnosis not present

## 2012-12-22 DIAGNOSIS — Z1389 Encounter for screening for other disorder: Secondary | ICD-10-CM | POA: Diagnosis not present

## 2012-12-22 DIAGNOSIS — Z6836 Body mass index (BMI) 36.0-36.9, adult: Secondary | ICD-10-CM | POA: Diagnosis not present

## 2013-02-10 DIAGNOSIS — N35919 Unspecified urethral stricture, male, unspecified site: Secondary | ICD-10-CM | POA: Diagnosis not present

## 2013-02-10 DIAGNOSIS — N401 Enlarged prostate with lower urinary tract symptoms: Secondary | ICD-10-CM | POA: Diagnosis not present

## 2013-02-10 DIAGNOSIS — E291 Testicular hypofunction: Secondary | ICD-10-CM | POA: Diagnosis not present

## 2013-02-10 DIAGNOSIS — N139 Obstructive and reflux uropathy, unspecified: Secondary | ICD-10-CM | POA: Diagnosis not present

## 2013-02-20 DIAGNOSIS — Z79899 Other long term (current) drug therapy: Secondary | ICD-10-CM | POA: Diagnosis not present

## 2013-02-20 DIAGNOSIS — E785 Hyperlipidemia, unspecified: Secondary | ICD-10-CM | POA: Diagnosis not present

## 2013-02-20 DIAGNOSIS — R0602 Shortness of breath: Secondary | ICD-10-CM | POA: Diagnosis not present

## 2013-02-20 DIAGNOSIS — I10 Essential (primary) hypertension: Secondary | ICD-10-CM | POA: Diagnosis not present

## 2013-02-20 DIAGNOSIS — I2510 Atherosclerotic heart disease of native coronary artery without angina pectoris: Secondary | ICD-10-CM | POA: Diagnosis not present

## 2013-03-01 ENCOUNTER — Other Ambulatory Visit: Payer: Self-pay | Admitting: Urology

## 2013-04-11 ENCOUNTER — Encounter (HOSPITAL_COMMUNITY): Payer: Self-pay | Admitting: Pharmacy Technician

## 2013-04-14 ENCOUNTER — Ambulatory Visit (HOSPITAL_COMMUNITY)
Admission: RE | Admit: 2013-04-14 | Discharge: 2013-04-14 | Disposition: A | Discharge status: Home / Self Care | Payer: Medicare Other | Source: Ambulatory Visit | Attending: Urology | Admitting: Urology

## 2013-04-14 ENCOUNTER — Encounter (HOSPITAL_COMMUNITY): Payer: Self-pay

## 2013-04-14 ENCOUNTER — Encounter (HOSPITAL_COMMUNITY)
Admission: RE | Admit: 2013-04-14 | Discharge: 2013-04-14 | Disposition: A | Discharge status: Home / Self Care | Payer: Medicare Other | Source: Ambulatory Visit | Attending: Urology | Admitting: Urology

## 2013-04-14 DIAGNOSIS — Z01818 Encounter for other preprocedural examination: Secondary | ICD-10-CM | POA: Insufficient documentation

## 2013-04-14 DIAGNOSIS — Z01812 Encounter for preprocedural laboratory examination: Secondary | ICD-10-CM | POA: Insufficient documentation

## 2013-04-14 DIAGNOSIS — J984 Other disorders of lung: Secondary | ICD-10-CM | POA: Diagnosis not present

## 2013-04-14 DIAGNOSIS — N35919 Unspecified urethral stricture, male, unspecified site: Secondary | ICD-10-CM | POA: Diagnosis not present

## 2013-04-14 LAB — BASIC METABOLIC PANEL
BUN: 25 mg/dL — ABNORMAL HIGH (ref 6–23)
CALCIUM: 9.6 mg/dL (ref 8.4–10.5)
CHLORIDE: 104 meq/L (ref 96–112)
CO2: 29 mEq/L (ref 19–32)
CREATININE: 0.85 mg/dL (ref 0.50–1.35)
GFR calc non Af Amer: 88 mL/min — ABNORMAL LOW (ref >90)
Glucose, Bld: 108 mg/dL — ABNORMAL HIGH (ref 70–99)
Potassium: 4 mEq/L (ref 3.7–5.3)
Sodium: 143 mEq/L (ref 137–147)

## 2013-04-14 LAB — CBC
HCT: 42.5 % (ref 39.0–52.0)
Hemoglobin: 14.5 g/dL (ref 13.0–17.0)
MCH: 32.4 pg (ref 26.0–34.0)
MCHC: 34.1 g/dL (ref 30.0–36.0)
MCV: 94.9 fL (ref 78.0–100.0)
PLATELETS: 216 10*3/uL (ref 150–400)
RBC: 4.48 MIL/uL (ref 4.22–5.81)
RDW: 13.2 % (ref 11.5–15.5)
WBC: 6.7 10*3/uL (ref 4.0–10.5)

## 2013-04-14 NOTE — Patient Instructions (Addendum)
Spencer Cochran  04/14/2013   Your procedure is scheduled on 3-19   -2015  Report to [facility] at     [facility]   AM .  Call this number if you have problems the morning of surgery: [phone]  Or Presurgical Testing [phone](Spencer Cochran) For Living Will and/or Health Care Power Attorney Forms: please provide copy for your medical record,may bring AM of surgery(Forms should be already notarized -we do not provide this service).(04-14-13 Bring copy of Living will ).  . For Cpap use: Bring mask and tubing only.   Do not eat food:After Midnight.    Take these medicines the morning of surgery with A SIP OF WATER: Carvedilol. Omeprazole. Reminder Short Stay will give Amlodipine 5 mg orally on arrival AM of.(Donot take Lotrel).   Do not wear jewelry, make-up or nail polish.  Do not wear lotions, powders, or perfumes. You may wear deodorant.  Do not shave 48 hours(2 days) prior to first CHG shower(legs and under arms).(Shaving face and neck okay.)  Do not bring valuables to the hospital.(Hospital is not responsible for lost valuables).  Contacts, dentures or removable bridgework, body piercing, hair pins may not be worn into surgery.  Leave suitcase in the car. After surgery it may be brought to your room.  For patients admitted to the hospital, checkout time is 11:00 AM the day of discharge.(Restricted visitors-Any Persons displaying flu-like symptoms or illness).    Patients discharged the day of surgery will not be allowed to drive home. Must have responsible person with you x 24 hours once discharged.  Name and phone number of your driver: [name], spouse [phone] cell  Special Instructions: CHG(Chlorhedine 4%-"Hibiclens","Betasept","Aplicare") Shower Use Special Wash: see special instructions.(avoid face and genitals)    Failure to follow these instructions may result in Cancellation of your surgery.   Patient  signature_______________________________________________________

## 2013-04-14 NOTE — Pre-Procedure Instructions (Addendum)
04-14-13 EKG 10'14 , Stress 6'14, Echo 5'05 -reports with chart. CXR done today.

## 2013-04-17 NOTE — Progress Notes (Signed)
04-17-13 1020 Labs viewable in Epic-please note.

## 2013-04-20 ENCOUNTER — Observation Stay (HOSPITAL_COMMUNITY)
Admission: RE | Admit: 2013-04-20 | Discharge: 2013-04-23 | Disposition: A | Discharge status: Home / Self Care | Payer: Medicare Other | Source: Ambulatory Visit | Attending: Urology | Admitting: Urology

## 2013-04-20 ENCOUNTER — Encounter (HOSPITAL_COMMUNITY)
Admission: RE | Disposition: A | Discharge status: Home / Self Care | Payer: Self-pay | Source: Ambulatory Visit | Attending: Urology

## 2013-04-20 ENCOUNTER — Encounter (HOSPITAL_COMMUNITY): Payer: Medicare Other | Admitting: Registered Nurse

## 2013-04-20 ENCOUNTER — Encounter (HOSPITAL_COMMUNITY): Payer: Self-pay | Admitting: *Deleted

## 2013-04-20 ENCOUNTER — Ambulatory Visit (HOSPITAL_COMMUNITY): Payer: Medicare Other | Admitting: Registered Nurse

## 2013-04-20 DIAGNOSIS — I10 Essential (primary) hypertension: Secondary | ICD-10-CM | POA: Diagnosis not present

## 2013-04-20 DIAGNOSIS — Z87442 Personal history of urinary calculi: Secondary | ICD-10-CM | POA: Insufficient documentation

## 2013-04-20 DIAGNOSIS — Z9089 Acquired absence of other organs: Secondary | ICD-10-CM | POA: Insufficient documentation

## 2013-04-20 DIAGNOSIS — G4730 Sleep apnea, unspecified: Secondary | ICD-10-CM | POA: Insufficient documentation

## 2013-04-20 DIAGNOSIS — R972 Elevated prostate specific antigen [PSA]: Secondary | ICD-10-CM | POA: Diagnosis not present

## 2013-04-20 DIAGNOSIS — R011 Cardiac murmur, unspecified: Secondary | ICD-10-CM | POA: Insufficient documentation

## 2013-04-20 DIAGNOSIS — N401 Enlarged prostate with lower urinary tract symptoms: Secondary | ICD-10-CM

## 2013-04-20 DIAGNOSIS — N138 Other obstructive and reflux uropathy: Secondary | ICD-10-CM | POA: Diagnosis present

## 2013-04-20 DIAGNOSIS — Q549 Hypospadias, unspecified: Secondary | ICD-10-CM | POA: Insufficient documentation

## 2013-04-20 DIAGNOSIS — N400 Enlarged prostate without lower urinary tract symptoms: Secondary | ICD-10-CM | POA: Diagnosis not present

## 2013-04-20 DIAGNOSIS — E7800 Pure hypercholesterolemia, unspecified: Secondary | ICD-10-CM | POA: Diagnosis not present

## 2013-04-20 DIAGNOSIS — E559 Vitamin D deficiency, unspecified: Secondary | ICD-10-CM | POA: Insufficient documentation

## 2013-04-20 DIAGNOSIS — R3914 Feeling of incomplete bladder emptying: Secondary | ICD-10-CM

## 2013-04-20 DIAGNOSIS — Z79899 Other long term (current) drug therapy: Secondary | ICD-10-CM | POA: Diagnosis not present

## 2013-04-20 DIAGNOSIS — Z7982 Long term (current) use of aspirin: Secondary | ICD-10-CM | POA: Diagnosis not present

## 2013-04-20 DIAGNOSIS — I2510 Atherosclerotic heart disease of native coronary artery without angina pectoris: Secondary | ICD-10-CM | POA: Diagnosis not present

## 2013-04-20 DIAGNOSIS — N529 Male erectile dysfunction, unspecified: Secondary | ICD-10-CM | POA: Diagnosis not present

## 2013-04-20 DIAGNOSIS — M129 Arthropathy, unspecified: Secondary | ICD-10-CM | POA: Diagnosis not present

## 2013-04-20 DIAGNOSIS — E669 Obesity, unspecified: Secondary | ICD-10-CM | POA: Diagnosis not present

## 2013-04-20 DIAGNOSIS — IMO0002 Reserved for concepts with insufficient information to code with codable children: Secondary | ICD-10-CM | POA: Diagnosis not present

## 2013-04-20 DIAGNOSIS — K219 Gastro-esophageal reflux disease without esophagitis: Secondary | ICD-10-CM | POA: Insufficient documentation

## 2013-04-20 DIAGNOSIS — D291 Benign neoplasm of prostate: Secondary | ICD-10-CM | POA: Diagnosis not present

## 2013-04-20 DIAGNOSIS — N35919 Unspecified urethral stricture, male, unspecified site: Secondary | ICD-10-CM | POA: Diagnosis not present

## 2013-04-20 DIAGNOSIS — E291 Testicular hypofunction: Secondary | ICD-10-CM | POA: Diagnosis not present

## 2013-04-20 DIAGNOSIS — I739 Peripheral vascular disease, unspecified: Secondary | ICD-10-CM | POA: Insufficient documentation

## 2013-04-20 DIAGNOSIS — N139 Obstructive and reflux uropathy, unspecified: Secondary | ICD-10-CM | POA: Insufficient documentation

## 2013-04-20 HISTORY — PX: TRANSURETHRAL RESECTION OF PROSTATE: SHX73

## 2013-04-20 HISTORY — PX: CYSTOSCOPY WITH URETHRAL DILATATION: SHX5125

## 2013-04-20 HISTORY — DX: Benign prostatic hyperplasia with lower urinary tract symptoms: N40.1

## 2013-04-20 SURGERY — CYSTOSCOPY, WITH URETHRAL DILATION
Anesthesia: General

## 2013-04-20 MED ORDER — ACETAMINOPHEN 10 MG/ML IV SOLN
1000.0000 mg | Freq: Once | INTRAVENOUS | Status: AC
  Administered 2013-04-20: 1000 mg via INTRAVENOUS
  Filled 2013-04-20: qty 100

## 2013-04-20 MED ORDER — FENTANYL CITRATE 0.05 MG/ML IJ SOLN
INTRAMUSCULAR | Status: AC
  Filled 2013-04-20: qty 2

## 2013-04-20 MED ORDER — VITAMIN C 500 MG PO TABS
500.0000 mg | ORAL_TABLET | Freq: Every day | ORAL | Status: DC
  Administered 2013-04-20 – 2013-04-23 (×4): 500 mg via ORAL
  Filled 2013-04-20 (×4): qty 1

## 2013-04-20 MED ORDER — PANTOPRAZOLE SODIUM 40 MG PO TBEC
40.0000 mg | DELAYED_RELEASE_TABLET | Freq: Every day | ORAL | Status: DC
  Administered 2013-04-21 – 2013-04-23 (×3): 40 mg via ORAL
  Filled 2013-04-20 (×4): qty 1

## 2013-04-20 MED ORDER — BELLADONNA ALKALOIDS-OPIUM 16.2-60 MG RE SUPP
RECTAL | Status: DC | PRN
  Administered 2013-04-20: 1 via RECTAL

## 2013-04-20 MED ORDER — FENTANYL CITRATE 0.05 MG/ML IJ SOLN
25.0000 ug | INTRAMUSCULAR | Status: DC | PRN
  Administered 2013-04-20 (×3): 50 ug via INTRAVENOUS

## 2013-04-20 MED ORDER — PROPOFOL 10 MG/ML IV BOLUS
INTRAVENOUS | Status: AC
  Filled 2013-04-20: qty 20

## 2013-04-20 MED ORDER — DEXAMETHASONE SODIUM PHOSPHATE 10 MG/ML IJ SOLN
INTRAMUSCULAR | Status: AC
  Filled 2013-04-20: qty 1

## 2013-04-20 MED ORDER — KETOROLAC TROMETHAMINE 30 MG/ML IJ SOLN
INTRAMUSCULAR | Status: AC
  Filled 2013-04-20: qty 1

## 2013-04-20 MED ORDER — STERILE WATER FOR IRRIGATION IR SOLN
Status: DC | PRN
  Administered 2013-04-20: 30 mL

## 2013-04-20 MED ORDER — FUROSEMIDE 40 MG PO TABS
40.0000 mg | ORAL_TABLET | ORAL | Status: DC
  Administered 2013-04-21 – 2013-04-23 (×2): 40 mg via ORAL
  Filled 2013-04-20 (×2): qty 1

## 2013-04-20 MED ORDER — CEFAZOLIN SODIUM-DEXTROSE 2-3 GM-% IV SOLR
2.0000 g | INTRAVENOUS | Status: AC
  Administered 2013-04-20: 2 g via INTRAVENOUS

## 2013-04-20 MED ORDER — MIDAZOLAM HCL 2 MG/2ML IJ SOLN
INTRAMUSCULAR | Status: AC
  Filled 2013-04-20: qty 2

## 2013-04-20 MED ORDER — PHENYLEPHRINE 40 MCG/ML (10ML) SYRINGE FOR IV PUSH (FOR BLOOD PRESSURE SUPPORT)
PREFILLED_SYRINGE | INTRAVENOUS | Status: AC
  Filled 2013-04-20: qty 10

## 2013-04-20 MED ORDER — AMLODIPINE BESYLATE 5 MG PO TABS
5.0000 mg | ORAL_TABLET | Freq: Every day | ORAL | Status: DC
  Administered 2013-04-20: 5 mg via ORAL
  Filled 2013-04-20: qty 1

## 2013-04-20 MED ORDER — BELLADONNA ALKALOIDS-OPIUM 16.2-60 MG RE SUPP
RECTAL | Status: AC
  Filled 2013-04-20: qty 1

## 2013-04-20 MED ORDER — TIMOLOL MALEATE (ONCE-DAILY) 0.5 % OP SOLN: 1.0000 [drp] | Freq: Two times a day (BID) | OPHTHALMIC | Status: DC

## 2013-04-20 MED ORDER — BISACODYL 10 MG RE SUPP
10.0000 mg | Freq: Every day | RECTAL | Status: DC | PRN
  Administered 2013-04-21: 10 mg via RECTAL
  Filled 2013-04-20: qty 1

## 2013-04-20 MED ORDER — LIDOCAINE HCL (CARDIAC) 20 MG/ML IV SOLN
INTRAVENOUS | Status: DC | PRN
  Administered 2013-04-20: 100 mg via INTRAVENOUS

## 2013-04-20 MED ORDER — KETOROLAC TROMETHAMINE 15 MG/ML IJ SOLN
INTRAMUSCULAR | Status: DC | PRN
  Administered 2013-04-20: 15 mg via INTRAVENOUS

## 2013-04-20 MED ORDER — FENTANYL CITRATE 0.05 MG/ML IJ SOLN
INTRAMUSCULAR | Status: DC | PRN
  Administered 2013-04-20 (×2): 50 ug via INTRAVENOUS

## 2013-04-20 MED ORDER — ACETAMINOPHEN 325 MG PO TABS
650.0000 mg | ORAL_TABLET | ORAL | Status: DC | PRN
  Administered 2013-04-22: 650 mg via ORAL
  Filled 2013-04-20: qty 2

## 2013-04-20 MED ORDER — SUCCINYLCHOLINE CHLORIDE 20 MG/ML IJ SOLN
INTRAMUSCULAR | Status: DC | PRN
  Administered 2013-04-20: 100 mg via INTRAVENOUS

## 2013-04-20 MED ORDER — PROPOFOL 10 MG/ML IV BOLUS
INTRAVENOUS | Status: DC | PRN
  Administered 2013-04-20: 50 mg via INTRAVENOUS
  Administered 2013-04-20: 250 mg via INTRAVENOUS

## 2013-04-20 MED ORDER — BENAZEPRIL HCL 40 MG PO TABS
40.0000 mg | ORAL_TABLET | Freq: Every day | ORAL | Status: DC
  Administered 2013-04-20 – 2013-04-23 (×4): 40 mg via ORAL
  Filled 2013-04-20 (×4): qty 1

## 2013-04-20 MED ORDER — ONDANSETRON HCL 4 MG/2ML IJ SOLN: 4.0000 mg | INTRAMUSCULAR | Status: DC | PRN

## 2013-04-20 MED ORDER — DEXAMETHASONE SODIUM PHOSPHATE 10 MG/ML IJ SOLN
INTRAMUSCULAR | Status: DC | PRN
  Administered 2013-04-20: 10 mg via INTRAVENOUS

## 2013-04-20 MED ORDER — ONDANSETRON HCL 4 MG/2ML IJ SOLN
INTRAMUSCULAR | Status: AC
  Filled 2013-04-20: qty 2

## 2013-04-20 MED ORDER — AMLODIPINE BESYLATE 5 MG PO TABS
5.0000 mg | ORAL_TABLET | Freq: Every day | ORAL | Status: DC
  Administered 2013-04-20 – 2013-04-23 (×4): 5 mg via ORAL
  Filled 2013-04-20 (×4): qty 1

## 2013-04-20 MED ORDER — PROMETHAZINE HCL 25 MG/ML IJ SOLN: 6.2500 mg | INTRAMUSCULAR | Status: DC | PRN

## 2013-04-20 MED ORDER — PHENYLEPHRINE HCL 10 MG/ML IJ SOLN
INTRAMUSCULAR | Status: DC | PRN
  Administered 2013-04-20 (×2): 80 ug via INTRAVENOUS
  Administered 2013-04-20: 40 ug via INTRAVENOUS

## 2013-04-20 MED ORDER — FENTANYL CITRATE 0.05 MG/ML IJ SOLN
INTRAMUSCULAR | Status: AC
Start: 2013-04-20 — End: 2013-04-20
  Filled 2013-04-20: qty 2

## 2013-04-20 MED ORDER — SENNOSIDES-DOCUSATE SODIUM 8.6-50 MG PO TABS
2.0000 | ORAL_TABLET | Freq: Every day | ORAL | Status: DC
  Administered 2013-04-20 – 2013-04-22 (×3): 2 via ORAL
  Filled 2013-04-20 (×4): qty 2

## 2013-04-20 MED ORDER — EPHEDRINE SULFATE 50 MG/ML IJ SOLN
INTRAMUSCULAR | Status: DC | PRN
  Administered 2013-04-20 (×5): 5 mg via INTRAVENOUS

## 2013-04-20 MED ORDER — LIDOCAINE HCL 2 % EX GEL
CUTANEOUS | Status: AC
  Filled 2013-04-20: qty 10

## 2013-04-20 MED ORDER — SODIUM CHLORIDE 0.9 % IR SOLN
Status: DC | PRN
  Administered 2013-04-20: 24000 mL

## 2013-04-20 MED ORDER — NAPROXEN SODIUM 220 MG PO TABS: 220.0000 mg | ORAL_TABLET | Freq: Two times a day (BID) | ORAL | Status: DC

## 2013-04-20 MED ORDER — CEFAZOLIN SODIUM-DEXTROSE 2-3 GM-% IV SOLR
INTRAVENOUS | Status: AC
  Filled 2013-04-20: qty 50

## 2013-04-20 MED ORDER — SODIUM CHLORIDE 0.45 % IV SOLN
INTRAVENOUS | Status: DC
  Administered 2013-04-20: 12:00:00 via INTRAVENOUS

## 2013-04-20 MED ORDER — OXYBUTYNIN CHLORIDE 5 MG PO TABS
5.0000 mg | ORAL_TABLET | Freq: Three times a day (TID) | ORAL | Status: DC | PRN
  Administered 2013-04-20 – 2013-04-21 (×2): 5 mg via ORAL
  Filled 2013-04-20 (×2): qty 1

## 2013-04-20 MED ORDER — BACITRACIN-NEOMYCIN-POLYMYXIN 400-5-5000 EX OINT: 1.0000 "application " | TOPICAL_OINTMENT | Freq: Three times a day (TID) | CUTANEOUS | Status: DC | PRN

## 2013-04-20 MED ORDER — ZOLPIDEM TARTRATE 5 MG PO TABS
5.0000 mg | ORAL_TABLET | Freq: Every evening | ORAL | Status: DC | PRN
  Administered 2013-04-20: 5 mg via ORAL
  Filled 2013-04-20: qty 1

## 2013-04-20 MED ORDER — LIDOCAINE HCL (CARDIAC) 20 MG/ML IV SOLN
INTRAVENOUS | Status: AC
  Filled 2013-04-20: qty 5

## 2013-04-20 MED ORDER — ATORVASTATIN CALCIUM 40 MG PO TABS
40.0000 mg | ORAL_TABLET | Freq: Every day | ORAL | Status: DC
  Administered 2013-04-20 – 2013-04-22 (×3): 40 mg via ORAL
  Filled 2013-04-20 (×4): qty 1

## 2013-04-20 MED ORDER — ONDANSETRON HCL 4 MG/2ML IJ SOLN
INTRAMUSCULAR | Status: DC | PRN
  Administered 2013-04-20: 4 mg via INTRAVENOUS

## 2013-04-20 MED ORDER — 0.9 % SODIUM CHLORIDE (POUR BTL) OPTIME
TOPICAL | Status: DC | PRN
  Administered 2013-04-20: 1000 mL

## 2013-04-20 MED ORDER — FLUTICASONE PROPIONATE 50 MCG/ACT NA SUSP
1.0000 | Freq: Every day | NASAL | Status: DC | PRN
  Filled 2013-04-20: qty 16

## 2013-04-20 MED ORDER — CIPROFLOXACIN HCL 500 MG PO TABS
500.0000 mg | ORAL_TABLET | Freq: Two times a day (BID) | ORAL | Status: DC
  Administered 2013-04-20 – 2013-04-23 (×6): 500 mg via ORAL
  Filled 2013-04-20 (×8): qty 1

## 2013-04-20 MED ORDER — LACTATED RINGERS IV SOLN
INTRAVENOUS | Status: DC | PRN
  Administered 2013-04-20: 09:00:00 via INTRAVENOUS

## 2013-04-20 MED ORDER — NAPROXEN 250 MG PO TABS
250.0000 mg | ORAL_TABLET | Freq: Two times a day (BID) | ORAL | Status: DC
  Administered 2013-04-21 – 2013-04-23 (×5): 250 mg via ORAL
  Filled 2013-04-20 (×7): qty 1

## 2013-04-20 MED ORDER — HYDROMORPHONE HCL PF 1 MG/ML IJ SOLN
0.5000 mg | INTRAMUSCULAR | Status: DC | PRN
  Administered 2013-04-20: 1 mg via INTRAVENOUS
  Filled 2013-04-20: qty 1

## 2013-04-20 MED ORDER — TIMOLOL MALEATE 0.5 % OP SOLN
1.0000 [drp] | Freq: Two times a day (BID) | OPHTHALMIC | Status: DC
  Administered 2013-04-20 – 2013-04-23 (×6): 1 [drp] via OPHTHALMIC
  Filled 2013-04-20 (×2): qty 5

## 2013-04-20 MED ORDER — CARVEDILOL 25 MG PO TABS
25.0000 mg | ORAL_TABLET | Freq: Two times a day (BID) | ORAL | Status: DC
  Administered 2013-04-20 – 2013-04-23 (×6): 25 mg via ORAL
  Filled 2013-04-20 (×8): qty 1

## 2013-04-20 MED ORDER — AMLODIPINE BESY-BENAZEPRIL HCL 5-40 MG PO CAPS: 1.0000 | ORAL_CAPSULE | Freq: Every morning | ORAL | Status: DC

## 2013-04-20 MED ORDER — MIDAZOLAM HCL 5 MG/5ML IJ SOLN
INTRAMUSCULAR | Status: DC | PRN
  Administered 2013-04-20: 2 mg via INTRAVENOUS

## 2013-04-20 SURGICAL SUPPLY — 21 items
BAG URINE DRAINAGE (UROLOGICAL SUPPLIES) ×2 IMPLANT
BAG URO CATCHER STRL LF (DRAPE) ×2 IMPLANT
CATH HEMA 3WAY 30CC 22FR COUDE (CATHETERS) ×2 IMPLANT
DRAPE CAMERA CLOSED 9X96 (DRAPES) ×2 IMPLANT
ELECT BUTTON HF 24-28F 2 30DE (ELECTRODE) IMPLANT
ELECT HF RESECT BIPO 24F 45 ND (CUTTING LOOP) ×2 IMPLANT
ELECT LOOP MED HF 24F 12D CBL (CLIP) IMPLANT
ELECT RESECT VAPORIZE 12D CBL (ELECTRODE) IMPLANT
GLOVE BIOGEL M STRL SZ7.5 (GLOVE) ×2 IMPLANT
GOWN STRL REUS W/ TWL LRG LVL3 (GOWN DISPOSABLE) ×1 IMPLANT
GOWN STRL REUS W/TWL LRG LVL3 (GOWN DISPOSABLE) ×1
GOWN STRL REUS W/TWL XL LVL3 (GOWN DISPOSABLE) ×2 IMPLANT
HOLDER FOLEY CATH W/STRAP (MISCELLANEOUS) IMPLANT
IV NS IRRIG 3000ML ARTHROMATIC (IV SOLUTION) IMPLANT
KIT ASPIRATION TUBING (SET/KITS/TRAYS/PACK) ×2 IMPLANT
MANIFOLD NEPTUNE II (INSTRUMENTS) ×2 IMPLANT
NS IRRIG 1000ML POUR BTL (IV SOLUTION) IMPLANT
PACK CYSTO (CUSTOM PROCEDURE TRAY) ×2 IMPLANT
SYR 30ML LL (SYRINGE) ×2 IMPLANT
SYRINGE IRR TOOMEY STRL 70CC (SYRINGE) ×2 IMPLANT
TUBING CONNECTING 10 (TUBING) ×2 IMPLANT

## 2013-04-20 NOTE — Op Note (Signed)
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 Gaynelle Arabian, MD  First assistant:  None  Anesthesia:  General endotracheal  Preparation:  After appropriate preanesthesia, the patient was brought to the operating room, placed on the operating table in the dorsal supine position where general endotracheal anesthesia was introduced. LMA anesthesia was attempted, but patient's trachea through to difficult for laryngeal airway, and therefore he was converted to endotracheal  Anesthesia. He was replaced in the dorsal lithotomy position with pubis was prepped with Betadine solution and draped in usual fashion. The arm band was double checked. The history was double checked.  Review history:  Problems  1. Benign localized hyperplasia of prostate with urinary obstruction (867.67,209.47)   Assessed By: Carolan Clines (Urology); Last Assessed: 10 Feb 2013  2. Bilateral kidney stones (592.0)  3. Elevated prostate specific antigen (PSA) (790.93)   Assessed By: Carolan Clines (Urology); Last Assessed: 03 Apr 2011  4. 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 Vitamin D deficiency (268.9)  History of Present Illness  68 YO male patient returns today for a yearly f/u with a hx of ED, hypogonadism, kidney stones, elevated PSA, urethral stricture & Vit D deficiency. Patient states that Dr. Linna Darner placed him on Cialis 2.5mg . Taking that along with using [place], he is doing well. He states that his flow is slow if he is sitting around. He is s/p cystourethroscopy, urethral dilatation, R RPG, for urethral stricture and microscopic hematuria 06/12/11. In December 2013, he  was recommended to have a Green light procedure.      Statement of  Likelihood of Success: Excellent. 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 The patient is status post hypospadias repair in the [employer]. He has known distal urethral and meatal stricture, and this is dilated from a 71 [place] to a size 30 [place] with the [employer]. No bleeding was noted.  Following this, the size 28 continuous flow gyrus resectoscope sheath is placed, and photodocumentation was accomplished of the patient's bilobar BPH, elevated median lobe, normal trigone, trabeculated bladder without cellule formation, stone formation, or tumor formation.  Resection was accomplished and the 7:00 to the 5:00 positions, and then from the 11:00 to the 7:00 position, and from the 1:00 to the 5:00 positions. A very large volume of prostatic chips were resected, and sent to laboratory for examination. The tissue timeout was accomplished for tissue transfer to the surgical tech.  Electrocoagulation of the prostatic fossa was accomplished, and a size 22 three-way hematuria catheter was passed, with irrigation to serosanguineous color. Traction was placed on the prostate, and continuous flow irrigation was accomplished.  The patient received IV Tylenol, and 15 mg of IV Toradol. He also received a B. and O. suppository at the beginning of the procedure. He was awakened, and taken to recovery room in good condition.

## 2013-04-20 NOTE — Preoperative (Signed)
Beta Blockers   Reason not to administer Beta Blockers:Coreg taken at 0530 04-20-13

## 2013-04-20 NOTE — Anesthesia Postprocedure Evaluation (Signed)
  Anesthesia Post-op Note  Patient: Spencer Cochran  Procedure(s) Performed: Procedure(s) (LRB): CYSTOSCOPY WITH URETHRAL DILATATION (N/A) TRANSURETHRAL RESECTION OF THE PROSTATE WITH GYRUS INSTRUMENTS (N/A)  Patient Location: PACU  Anesthesia Type: General  Level of Consciousness: awake and alert   Airway and Oxygen Therapy: Patient Spontanous Breathing  Post-op Pain: mild  Post-op Assessment: Post-op Vital signs reviewed, Patient's Cardiovascular Status Stable, Respiratory Function Stable, Patent Airway and No signs of Nausea or vomiting  Last Vitals:  Filed Vitals:   04/20/13 1145  BP: 119/60  Pulse: 57  Temp: 36.5 C  Resp: 13    Post-op Vital Signs: stable   Complications: No apparent anesthesia complications. No sign of OSA nor sedation

## 2013-04-20 NOTE — Anesthesia Preprocedure Evaluation (Addendum)
Anesthesia Evaluation  Patient identified by MRN, date of birth, ID band Patient awake    Reviewed: Allergy & Precautions, H&P , NPO status , Patient's Chart, lab work & pertinent test results  Airway Mallampati: II TM Distance: >3 FB Neck ROM: Full    Dental no notable dental hx.    Pulmonary sleep apnea and Continuous Positive Airway Pressure Ventilation ,  breath sounds clear to auscultation  Pulmonary exam normal       Cardiovascular hypertension, Pt. on medications and Pt. on home beta blockers + CAD Rhythm:Regular Rate:Normal     Neuro/Psych negative neurological ROS  negative psych ROS   GI/Hepatic Neg liver ROS, GERD-  Medicated,  Endo/Other  negative endocrine ROS  Renal/GU Renal disease  negative genitourinary   Musculoskeletal negative musculoskeletal ROS (+)   Abdominal (+) + obese,   Peds negative pediatric ROS (+)  Hematology negative hematology ROS (+)   Anesthesia Other Findings   Reproductive/Obstetrics negative OB ROS                          Anesthesia Physical Anesthesia Plan  ASA: III  Anesthesia Plan: General   Post-op Pain Management:    Induction: Intravenous  Airway Management Planned: LMA  Additional Equipment:   Intra-op Plan:   Post-operative Plan: Extubation in OR  Informed Consent: I have reviewed the patients History and Physical, chart, labs and discussed the procedure including the risks, benefits and alternatives for the proposed anesthesia with the patient or authorized representative who has indicated his/her understanding and acceptance.   Dental advisory given  Plan Discussed with: CRNA  Anesthesia Plan Comments:         Anesthesia Quick Evaluation

## 2013-04-20 NOTE — Progress Notes (Signed)
Pt arrived to unit from PACU, urine dark red and does not appear to be draining well. Hand irrigation performed with large amounts of clots and sediment returned.  CBI running open at this time. Will continue to monitor. Blimie Vaness A

## 2013-04-20 NOTE — Transfer of Care (Signed)
Immediate Anesthesia Transfer of Care Note  Patient: Spencer Cochran  Procedure(s) Performed: Procedure(s): CYSTOSCOPY WITH URETHRAL DILATATION (N/A) TRANSURETHRAL RESECTION OF THE PROSTATE WITH GYRUS INSTRUMENTS (N/A)  Patient Location: PACU  Anesthesia Type:General  Level of Consciousness: awake, alert , oriented and patient cooperative  Airway & Oxygen Therapy: Patient Spontanous Breathing and Patient connected to face mask oxygen  Post-op Assessment: Report given to PACU RN, Post -op Vital signs reviewed and stable and Patient moving all extremities  Post vital signs: Reviewed and stable  Complications: No apparent anesthesia complications

## 2013-04-20 NOTE — Interval H&P Note (Signed)
History and Physical Interval Note:  04/20/2013 9:12 AM  Spencer Cochran  has presented today for surgery, with the diagnosis of urethral stricture  BPH  The various methods of treatment have been discussed with the patient and family. After consideration of risks, benefits and other options for treatment, the patient has consented to  Procedure(s): CYSTOSCOPY WITH URETHRAL DILATATION (N/A) TRANSURETHRAL RESECTION OF THE PROSTATE WITH GYRUS INSTRUMENTS (N/A) as a surgical intervention .  The patient's history has been reviewed, patient examined, no change in status, stable for surgery.  I have reviewed the patient's chart and labs.  Questions were answered to the patient's satisfaction.     Carolan Clines I

## 2013-04-20 NOTE — H&P (Signed)
Reason For Visit Yearly f/u   Active Problems Problems  1. Benign localized hyperplasia of prostate with urinary obstruction (073.71,062.69)   Assessed By: Carolan Clines (Urology); Last Assessed: 10 Feb 2013 2. Bilateral kidney stones (592.0) 3. Elevated prostate specific antigen (PSA) (790.93)   Assessed By: Carolan Clines (Urology); Last Assessed: 03 Apr 2011 4. 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 Vitamin D deficiency (268.9)  History of Present Illness     68 YO male patient returns today for a yearly f/u with a hx of ED, hypogonadism, kidney stones, elevated PSA, urethral stricture & Vit D deficiency. Patient states that Dr. Linna Darner placed him on Cialis 2.5mg . Taking that along with using [place], he is doing well. He states that his flow is slow if he is sitting around. He is s/p cystourethroscopy, urethral dilatation, R RPG, for urethral stricture and microscopic hematuria 06/12/11. In December 2013, he was recommended to have a Green light procedure.    10/14/11 Testosterone - 474.93   Past Medical History Problems  1. History of Acute cystitis without hematuria (595.0) 2. History of Arthritis (V13.4) 3. History of Dysuria (788.1) 4. 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 History of acute prostatitis (V13.89) 6. History of hypercholesterolemia (V12.29) 7. History of hypertension (V12.59) 8. History of sleep apnea (V13.89) 9. History of Murmur (785.2)  Surgical History Problems  1. History of Appendectomy 2. History of Cystoscopy (Diagnostic) 3. History of Cystoscopy For Urethral Stricture 4. History of Inguinal Hernia Repair 5. History of Lithotripsy 6. History of Primary Repair Of Ruptured Achilles Tendon 7. History of Transurethral Incision Of Prostate 8. History of Urethra  Surgery  Current Meds 1. Aspirin 81 MG Oral Tablet;  Therapy: (Recorded:06May2008) to Recorded 2. Carvedilol 25 MG Oral Tablet;  Therapy: (Recorded:01Mar2013) to Recorded 3. Fish Oil CAPS;  Therapy: (Recorded:14Oct2010) to Recorded 4. Furosemide 40 MG Oral Tablet;  Therapy: (Recorded:16Nov2011) to Recorded 5. Lipitor 40 MG Oral Tablet;  Therapy: (Recorded:01Mar2013) to Recorded 6. Lotrel 5-40 MG Oral Capsule;  Therapy: (Recorded:01Mar2013) to Recorded 7. Multi-Vitamin Oral Tablet;  Therapy: (Recorded:06May2008) to Recorded 8. Omeprazole 20 MG Oral Capsule Delayed Release;  Therapy: (Recorded:18Dec2007) to Recorded 9. Vitamin C 500 MG Oral Tablet;  Therapy: (Recorded:01Mar2013) to Recorded 10. Vitamin D 50000 UNIT CAPS; TAKE 1 CAPSULE WEEKLY;   Therapy: 18Aug2009 to (Last Rx:18Aug2009) Ordered  Allergies Medication  1. Hydrocodone-Acetaminophen CAPS  Family History Problems  1. Family history of Aneurysm Of The Abdominal Aorta : Father 2. Family history of Bladder Cancer (S85.46) : Father 3. Family history of Family Health Status Number Of Children   1 son, 1 daughter 4. Family history of Renal Failure : Father 5. Family history of Urologic Disorder (V18.7) : Father   Blood in urine  Social History Problems  1. Denied: Alcohol Use 2. Denied: Caffeine Use 3. Family history of Death In The Family Father   Age 7 4. Family history of Death In The Family Mother   Liver cancer 5. Marital History - Currently Married 6. Never A Smoker 7. Occupation:   Designer, industrial/product 8. Denied: Tobacco Use (V15.82)  Review of Systems Genitourinary, constitutional, skin, eye, otolaryngeal, hematologic/lymphatic, cardiovascular, pulmonary, endocrine, musculoskeletal, gastrointestinal, neurological and psychiatric system(s) were reviewed and pertinent findings if present are noted.  Genitourinary: urinary frequency, nocturia, weak urinary stream and urinary stream starts and  stops.  Constitutional: feeling tired (fatigue).  Vitals Vital Signs [Data Includes: Last 1 Day]  Recorded: [id number] 01:09PM  Height: 5 ft 9 in Weight: 252 lb  BMI Calculated: 37.21 BSA Calculated: 2.28 Blood Pressure: 129 / 76 Temperature: 98.3 F Heart Rate: 71  Physical Exam Constitutional: Well nourished and well developed . No acute distress.  ENT:. The ears and nose are normal in appearance.  Neck: The appearance of the neck is normal and no neck mass is present.  Pulmonary: No respiratory distress and normal respiratory rhythm and effort.  Cardiovascular: Heart rate and rhythm are normal . No peripheral edema.  Abdomen: The abdomen is soft and nontender. No masses are palpated. No CVA tenderness. No hernias are palpable. No hepatosplenomegaly noted.  Rectal: Rectal exam demonstrates normal sphincter tone, no tenderness, no masses and no residual hemorrhoidal skin tags seen. Estimated prostate size is 3+. Normal rectal tone, no rectal masses, prostate is smooth, symmetric and non-tender. The prostate has no nodularity and is not tender. The left seminal vesicle is nonpalpable. The right seminal vesicle is nonpalpable. The perineum is normal on inspection.  Genitourinary: Examination of the penis demonstrates no discharge, no masses, no lesions and a normal meatus. The scrotum is without lesions. The right epididymis is palpably normal and non-tender. The left epididymis is palpably normal and non-tender. The right testis is non-tender and without masses. The left testis is non-tender and without masses.  Lymphatics: The femoral and inguinal nodes are not enlarged or tender.  Skin: Normal skin turgor, no visible rash and no visible skin lesions.  Neuro/Psych:. Mood and affect are appropriate.    Results/Data Urine [Data Includes: Last 1 Day]   [id number]  COLOR YELLOW   APPEARANCE CLEAR   SPECIFIC GRAVITY 1.020   pH 5.5   GLUCOSE NEG mg/dL  BILIRUBIN NEG   KETONE NEG mg/dL   BLOOD TRACE   PROTEIN NEG mg/dL  UROBILINOGEN 0.2 mg/dL  NITRITE NEG   LEUKOCYTE ESTERASE TRACE   SQUAMOUS EPITHELIAL/HPF NONE SEEN   WBC 0-2 WBC/hpf  RBC 3-6 RBC/hpf  BACTERIA NONE SEEN   CRYSTALS NONE SEEN   CASTS NONE SEEN    Assessment Assessed  1. 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 Benign localized hyperplasia of prostate with urinary obstruction (600.21,599.69) 3. 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 He may need dilation, and may need TURP co-incidentally.   Hypogonadism: He feels much better on [place]. Needs T level, however. currently low energy, and low libido.   Plan Benign localized hyperplasia of prostate with urinary obstruction  1. PSA REFLEX TO FREE; Status:Hold For - Specimen/Data Collection,Appointment;  Requested [id number];  Health Maintenance  2. UA With REFLEX; [Do Not Release]; Status:Resulted - Requires Verification;   Done:  [id number] 12:52PM Hypogonadism, testicular  3. Start: AndroGel Pump 20.25 MG/ACT (1.62%) Transdermal Gel; 3 pumps to the upper  arms and shoulders/day 4. HEMOGLOBIN & HEMATOCRIT; Status:Hold For - Specimen/Data Collection,Appointment;  Requested [id number];  5. TESTOSTERONE; Status:Hold For - Specimen/Data Collection,Appointment; Requested  [id number];   1. PSA, T today.   2. Schedule cysto, urethral dilation , and TURP.   Signatures Electronically signed by : Carolan Clines, M.D.; Feb 10 2013  1:40PM EST

## 2013-04-21 DIAGNOSIS — Q549 Hypospadias, unspecified: Secondary | ICD-10-CM | POA: Diagnosis not present

## 2013-04-21 DIAGNOSIS — R972 Elevated prostate specific antigen [PSA]: Secondary | ICD-10-CM | POA: Diagnosis not present

## 2013-04-21 DIAGNOSIS — N401 Enlarged prostate with lower urinary tract symptoms: Secondary | ICD-10-CM | POA: Diagnosis not present

## 2013-04-21 DIAGNOSIS — IMO0002 Reserved for concepts with insufficient information to code with codable children: Secondary | ICD-10-CM | POA: Diagnosis not present

## 2013-04-21 DIAGNOSIS — N138 Other obstructive and reflux uropathy: Secondary | ICD-10-CM | POA: Diagnosis not present

## 2013-04-21 DIAGNOSIS — N139 Obstructive and reflux uropathy, unspecified: Secondary | ICD-10-CM | POA: Diagnosis not present

## 2013-04-21 DIAGNOSIS — N529 Male erectile dysfunction, unspecified: Secondary | ICD-10-CM | POA: Diagnosis not present

## 2013-04-21 MED ORDER — TRAMADOL-ACETAMINOPHEN 37.5-325 MG PO TABS: 1.0000 | ORAL_TABLET | Freq: Four times a day (QID) | ORAL | Status: DC | PRN

## 2013-04-21 MED ORDER — UROGESIC-BLUE 81.6 MG PO TABS: 1.0000 | ORAL_TABLET | Freq: Four times a day (QID) | ORAL | Status: DC | PRN

## 2013-04-21 MED ORDER — CIPROFLOXACIN HCL 500 MG PO TABS: 500.0000 mg | ORAL_TABLET | Freq: Two times a day (BID) | ORAL | Status: DC

## 2013-04-21 MED ORDER — DIPHENHYDRAMINE HCL 25 MG PO CAPS
25.0000 mg | ORAL_CAPSULE | Freq: Once | ORAL | Status: AC
  Administered 2013-04-21: 25 mg via ORAL
  Filled 2013-04-21: qty 1

## 2013-04-21 NOTE — Progress Notes (Signed)
Patient ambulated around unit 2.5 times without complaint.  SCD administered upon placement in bed.   Sallye Ober SN [facility]

## 2013-04-21 NOTE — Progress Notes (Signed)
Patient is lying in bed with eyes open talking on the phone.  Assessment went as follows: Patient has normal cephalic with no lesions or cuts, appropriate for his ethnicity.  Ears were flexible with no lesions or use of assistive devices.  Eyes were free of any defects, were reactive to light and accommodated to distance.  Patient was wearing vision corrective lens.  Pupils were reactive and equal in size.  Sclera was white and conjunctiva was pink and moist.  Nose was midline, with no septal deviation.  Mucous membranes were pink and moist.  Teeth were intact and gums and tongue were pink with no lesions.  Skin was dry and intact and appropriate for race.  Heart rate and rhythm was within normal limits.  Respiratory exam revealed slightly diminished lower lungs sounds bilaterally.  Pulses were 2+ on all extremities, lower extremity was checked without SCD usage.  Musculoskeletal exam was performed with hand grip strength test that showed equal bilaterally.  During assessment patient was able to stand and walk around room with minimal assistance.  Patient also changed gowns without assistance.  Foley bag was emptied with 550 mL of fluid drained.  Urine was red in color and clear.  Patient did not complain of pain and did not need for anything at time.   Educated patient about two of his medications: carvedilol and amlodipine.  Gave patient drug reference information to take home upon discharge.  Patient was interested in when he could have his IV access removed.  Consulted with RN and RN would speak to patient about IV access.    Filed Vitals:   04/21/13 1500  BP: 101/42  Pulse: 65  Temp: 98.6 F (37 C)  Resp: 16    Bracken Moffa Hilliard-Ziemba SN [facility]

## 2013-04-21 NOTE — Progress Notes (Signed)
Pt received education about Foley placement and leakage issues related to his catheter. Pt verbalized understanding of education.

## 2013-04-21 NOTE — Progress Notes (Signed)
Student nurse and pt discovered small clot at the base of the Foley. Flushed with 20 ml and clot dispersed. Foley draining red, bloody urine similar prior to clot being noticed. Clot was present in slightly dependent position of catheter. Catheter was straightened to reduce dependent zones. Pt denies bladder spasms or feelings of bladder distention. Will continue to monitor.

## 2013-04-21 NOTE — Progress Notes (Signed)
Patient ambulated around unit three times without complaint.  SCD administered upon placement in bed.

## 2013-04-21 NOTE — Discharge Instructions (Signed)
Benign Prostatic Hyperplasia An enlarged prostate (benign prostatic hyperplasia) is common in older men. You may experience the following:  Weak urine stream.  Dribbling.  Feeling like the bladder has not emptied completely.  Difficulty starting urination.  Getting up frequently at night to urinate.  Urinating more frequently during the day. HOME CARE INSTRUCTIONS  Monitor your prostatic hyperplasia for any changes. The following actions may help to alleviate any discomfort you are experiencing:  Give yourself time when you urinate.  Stay away from alcohol.  Avoid beverages containing caffeine, such as coffee, tea, and colas, because they can make the problem worse.  Avoid decongestants, antihistamines, and some prescription medicines that can make the problem worse.  Follow up with your health care provider for further treatment as recommended. SEEK MEDICAL CARE IF:  You are experiencing progressive difficulty voiding.  Your urine stream is progressively getting narrower.  You are awaking from sleep with the urge to void more frequently.  You are constantly feeling the need to void.  You experience loss of urine, especially in small amounts. SEEK IMMEDIATE MEDICAL CARE IF:   You develop increased pain with urination or are unable to urinate.  You develop severe abdominal pain, vomiting, a high fever, or fainting.  You develop back pain or blood in your urine. MAKE SURE YOU:   Understand these instructions.  Will watch your condition.  Will get help right away if you are not doing well or get worse. Document Released: 01/19/2005 Document Revised: 09/21/2012 Document Reviewed: 06/21/2012 [facility] Patient Information 2014 [place]. Post transurethral resection of the prostate (TURP) instructions  Your recent prostate surgery requires very special post hospital care. Despite the fact that no skin incisions were used the area around the prostate incision is  quite raw and is covered with a scab to promote healing and prevent bleeding. Certain cautions are needed to assure that the scab is not disturbed of the next 2-3 weeks while the healing proceeds.  Because the raw surface in your prostate and the irritating effects of urine you may expect frequency of urination and/or urgency (a stronger desire to urinate) and perhaps even getting up at night more often. This will usually resolve or improve slowly over the healing period. You may see some blood in your urine over the first 6 weeks. Do not be alarmed, even if the urine was clear for a while. Get off your feet and drink lots of fluids until clearing occurs. If you start to pass clots or don't improve call us.  Diet:  You may return to your normal diet immediately. Because of the raw surface of your bladder, alcohol, spicy foods, foods high in acid and drinks with caffeine may cause irritation or frequency and should be used in moderation. To keep your urine flowing freely and avoid constipation, drink plenty of fluids during the day (8-10 glasses). Tip: Avoid cranberry juice because it is very acidic.  Activity:  Your physical activity doesn't need to be restricted. However, if you are very active, you may see some blood in the urine. We suggest that you reduce your activity under the circumstances until the bleeding has stopped.  Bowels:  It is important to keep your bowels regular during the postoperative period. Straining with bowel movements can cause bleeding. A bowel movement every other day is reasonable. Use a mild laxative if needed, such as milk of magnesia 2-3 tablespoons, or 2 Dulcolax tablets. Call if you continue to have problems. If you had been  taking narcotics for pain, before, during or after your surgery, you may be constipated. Take a laxative if necessary.  Medication:  You should resume your pre-surgery medications unless told not to. In addition you may be given an antibiotic  to prevent or treat infection. Antibiotics are not always necessary. All medication should be taken as prescribed until the bottles are finished unless you are having an unusual reaction to one of the drugs.     Problems you should report to [place]:  a. Fever greater than 101F. b. Heavy bleeding, or clots (see notes above about blood in urine). c. Inability to urinate. d. Drug reactions (hives, rash, nausea, vomiting, diarrhea). e. Severe burning or pain with urination that is not improving.

## 2013-04-21 NOTE — Progress Notes (Signed)
Urology Progress Note  1 Day Post-Op   Subjective: Excellent night. Urine with occasional clot. Somewhat bloody-clears with CBI pulse.     No acute urologic events overnight. Ambulation:   positive Flatus:    positive Bowel movement  negative  Pain: some relief. C/o spasms  Objective:  Blood pressure 100/63, pulse 73, temperature 97.2 F (36.2 C), temperature source Oral, resp. rate 18, height 5\' 9"  (1.753 m), weight 112.209 kg (247 lb 6 oz), SpO2 97.00%.  Physical Exam:  General:  No acute distress, awake Extremities: extremities normal, atraumatic, no cyanosis or edema Genitourinary:   Foley in place. Irrigated.  Foley: irrigated.     I/O last 3 completed shifts: In: 26230 [P.O.:480; I.V.:3400; VXYIA:16553; IV Piggyback:100] Out: [id number] [Urine:28125; Blood:75]  No results found for this basename: HGB, WBC, PLT,  in the last 72 hours  No results found for this basename: NA, K, CL, CO2, BUN, CREATININE, CALCIUM, MAGNESIUM, GFRNONAA, GFRAA,  in the last 72 hours   No results found for this basename: PT, INR, APTT,  in the last 72 hours   No components found with this basename: ABG,   Assessment/Plan:  Catheter not removed. Continue any current medications. D/c cbi, ambulate D/c plan for AM.

## 2013-04-21 NOTE — Progress Notes (Signed)
I have reviewed and read all documentation from student nurse.

## 2013-04-21 NOTE — Progress Notes (Signed)
Patient is in lying in bed with CPAP, with lights out.  Patient stated earlier he did not rest well last night.  Given time to rest, patient states that he is comfortable with no pain.  Bed in lowest position, call light and bed side table is within reach.  Patient has agreed to call if he needs assistance.   Sallye Ober SN [facility]

## 2013-04-22 DIAGNOSIS — N139 Obstructive and reflux uropathy, unspecified: Secondary | ICD-10-CM | POA: Diagnosis not present

## 2013-04-22 DIAGNOSIS — N401 Enlarged prostate with lower urinary tract symptoms: Secondary | ICD-10-CM | POA: Diagnosis not present

## 2013-04-22 DIAGNOSIS — R972 Elevated prostate specific antigen [PSA]: Secondary | ICD-10-CM | POA: Diagnosis not present

## 2013-04-22 DIAGNOSIS — Q549 Hypospadias, unspecified: Secondary | ICD-10-CM | POA: Diagnosis not present

## 2013-04-22 DIAGNOSIS — N138 Other obstructive and reflux uropathy: Secondary | ICD-10-CM | POA: Diagnosis not present

## 2013-04-22 DIAGNOSIS — N529 Male erectile dysfunction, unspecified: Secondary | ICD-10-CM | POA: Diagnosis not present

## 2013-04-22 DIAGNOSIS — IMO0002 Reserved for concepts with insufficient information to code with codable children: Secondary | ICD-10-CM | POA: Diagnosis not present

## 2013-04-22 MED ORDER — DIPHENHYDRAMINE HCL 25 MG PO CAPS
25.0000 mg | ORAL_CAPSULE | Freq: Four times a day (QID) | ORAL | Status: DC | PRN
  Administered 2013-04-22: 25 mg via ORAL
  Filled 2013-04-22: qty 1

## 2013-04-22 NOTE — Progress Notes (Signed)
Ready to go home Trial of voiding this am Dark blood small amounts Vitals OK PVR and call me and send home

## 2013-04-22 NOTE — Progress Notes (Signed)
79ml voided with 150ml residual on bladder scanner, still with large clots.  Pt is very concerned about going home like this, MD aware, see new orders.  Will keep and reassess in AM

## 2013-04-22 NOTE — Progress Notes (Signed)
Pt voided another 121ml but had large clots in it, 24ml PVR.  Will let him void again before calling for DC

## 2013-04-22 NOTE — Progress Notes (Signed)
Pt voided 100 ml bloody urine, 0 post void residual noted, Md aware. Will check again with next void.

## 2013-04-23 DIAGNOSIS — N139 Obstructive and reflux uropathy, unspecified: Secondary | ICD-10-CM | POA: Diagnosis not present

## 2013-04-23 DIAGNOSIS — N401 Enlarged prostate with lower urinary tract symptoms: Secondary | ICD-10-CM | POA: Diagnosis not present

## 2013-04-23 DIAGNOSIS — R972 Elevated prostate specific antigen [PSA]: Secondary | ICD-10-CM | POA: Diagnosis not present

## 2013-04-23 DIAGNOSIS — Q549 Hypospadias, unspecified: Secondary | ICD-10-CM | POA: Diagnosis not present

## 2013-04-23 DIAGNOSIS — N138 Other obstructive and reflux uropathy: Secondary | ICD-10-CM | POA: Diagnosis not present

## 2013-04-23 DIAGNOSIS — N529 Male erectile dysfunction, unspecified: Secondary | ICD-10-CM | POA: Diagnosis not present

## 2013-04-23 DIAGNOSIS — IMO0002 Reserved for concepts with insufficient information to code with codable children: Secondary | ICD-10-CM | POA: Diagnosis not present

## 2013-04-23 NOTE — Progress Notes (Signed)
Patient ID: Spencer Cochran, male   DOB: 1945/09/19, 68 y.o.   MRN: [id number]  Patient without complaints.  Urine clear on a slow CBI.Current CBI off and urine stays clear.  Discussed DC home with Foley or void trial.  Patient elects for a trial.  Physical exam: Abdomen is soft and nontender I increased his CBI and Deflated the balloon.  I washed out an old clot and there was no continued bleeding. I filled his bladder with CBI irrigation until he had the urge to void.  Foley was removed.  He voided with a good flow about 200 cc with a couple of old clots.  Impression/plan-status post TURP postop day 3: Void trial Hopefully home later without Foley Rack urine

## 2013-04-24 ENCOUNTER — Encounter (HOSPITAL_COMMUNITY): Payer: Self-pay | Admitting: Urology

## 2013-04-27 DIAGNOSIS — R82998 Other abnormal findings in urine: Secondary | ICD-10-CM | POA: Diagnosis not present

## 2013-04-27 NOTE — Discharge Summary (Signed)
Physician Discharge Summary  Patient ID: Spencer Cochran MRN: [id number] DOB/AGE: 09-27-1945 68 y.o.  Admit date: 04/20/2013 Discharge date: 04/27/2013  Admission Diagnoses: urethral stricture  BPH Review history: Problems  1. Benign localized hyperplasia of prostate with urinary obstruction (782.95,621.30)   Assessed By: Carolan Clines (Urology); Last Assessed: 10 Feb 2013  2. Bilateral kidney stones (592.0)  3. Elevated prostate specific antigen (PSA) (790.93)   Assessed By: Carolan Clines (Urology); Last Assessed: 03 Apr 2011  4. 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 Vitamin D deficiency (268.9)  History of Present Illness  68 YO male patient returns today for a yearly f/u with a hx of ED, hypogonadism, kidney stones, elevated PSA, urethral stricture & Vit D deficiency. Patient states that Dr. Linna Darner placed him on Cialis 2.5mg . Taking that along with using [place], he is doing well. He states that his flow is slow if he is sitting around. He is s/p cystourethroscopy, urethral dilatation, R RPG, for urethral stricture and microscopic hematuria 06/12/11. In December 2013, he was recommended to have a [facility] TURP.    Discharge Diagnoses:  Active Problems:   Benign prostatic hypertrophy with incomplete bladder emptying Post hypospadius urethral stricture.   Discharged Condition: good  Hospital Course:   Urethral dilation and TURP  Consults: None  Significant Diagnostic Studies: No results found.  Treatments: IV hydration  Discharge Exam: Blood pressure 123/57, pulse 68, temperature 97.7 F (36.5 C), temperature source Oral, resp. rate 20, height 5\' 9"  (1.753 m), weight 112.209 kg (247 lb 6 oz), SpO2 95.00%. General appearance: alert and cooperative  Disposition: 01-Home or Self Care  Discharge  Orders   Future Orders Complete By Expires   Discharge patient  As directed    Comments:     In AM per on call Urologist   Discontinue IV  As directed    Scheduling Instructions:     In AM       Medication List    STOP taking these medications       aspirin EC 81 MG tablet     EXCEDRIN EXTRA STRENGTH 250-250-65 MG per tablet  Generic drug:  aspirin-acetaminophen-caffeine      TAKE these medications       amLODipine-benazepril 5-40 MG per capsule  Commonly known as:  LOTREL  Take 1 capsule by mouth every morning.     atorvastatin 40 MG tablet  Commonly known as:  LIPITOR  Take 40 mg by mouth at bedtime.     carvedilol 25 MG tablet  Commonly known as:  COREG  Take 25 mg by mouth 2 (two) times daily with a meal.     ciprofloxacin 500 MG tablet  Commonly known as:  CIPRO  Take 1 tablet (500 mg total) by mouth 2 (two) times daily.     [employer] VIT D 5000 HIGH-POTENCY PO  Take 10,000 Units by mouth daily.     ferrous sulfate 325 (65 FE) MG EC tablet  Take 325 mg by mouth daily with breakfast.     fish oil-omega-3 fatty acids 1000 MG capsule  Take 1 g by mouth 2 (two) times daily.     fluticasone 50 MCG/ACT nasal spray  Commonly known as:  FLONASE  Place 1 spray into both nostrils daily as needed for allergies or rhinitis.     furosemide 40 MG tablet  Commonly known  as:  LASIX  Take 40 mg by mouth every other day.     multivitamin with minerals tablet  Take 1 tablet by mouth daily.     naproxen sodium 220 MG tablet  Commonly known as:  ANAPROX  Take 660 mg by mouth 2 (two) times daily as needed (pain).     omeprazole 20 MG capsule  Commonly known as:  PRILOSEC  Take 20 mg by mouth every morning.     testosterone 50 MG/5GM (1%) Gel  Commonly known as:  ANDROGEL  Place 5 g onto the skin daily.     Timolol Maleate 0.5 % (DAILY) Soln  Place 1 drop into both eyes 2 (two) times daily.     traMADol-acetaminophen 37.5-325 MG per tablet  Commonly known as:   ULTRACET  Take 1 tablet by mouth every 6 (six) hours as needed for moderate pain.     UROGESIC-BLUE 81.6 MG Tabs  Take 1 tablet (81.6 mg total) by mouth 4 (four) times daily as needed.     vitamin C 500 MG tablet  Commonly known as:  ASCORBIC ACID  Take 500 mg by mouth daily.           Follow-up Information   Follow up with Carolan Clines I, MD. (per appointment)    Specialty:  Urology   Contact information:   [facility]  [place] [address] [phone]       Follow up with Ailene Rud, MD.   Specialty:  Urology   Contact information:   [facility]  [place] [address] [phone]       Signed: Carolan Clines I 04/27/2013, 10:30 AM

## 2013-06-27 DIAGNOSIS — R059 Cough, unspecified: Secondary | ICD-10-CM | POA: Diagnosis not present

## 2013-06-27 DIAGNOSIS — Z139 Encounter for screening, unspecified: Secondary | ICD-10-CM | POA: Diagnosis not present

## 2013-06-27 DIAGNOSIS — R05 Cough: Secondary | ICD-10-CM | POA: Diagnosis not present

## 2013-06-27 DIAGNOSIS — Z1331 Encounter for screening for depression: Secondary | ICD-10-CM | POA: Diagnosis not present

## 2013-06-27 DIAGNOSIS — Z6837 Body mass index (BMI) 37.0-37.9, adult: Secondary | ICD-10-CM | POA: Diagnosis not present

## 2013-07-01 DIAGNOSIS — J0100 Acute maxillary sinusitis, unspecified: Secondary | ICD-10-CM | POA: Diagnosis not present

## 2013-07-19 DIAGNOSIS — N529 Male erectile dysfunction, unspecified: Secondary | ICD-10-CM | POA: Diagnosis not present

## 2013-07-19 DIAGNOSIS — N139 Obstructive and reflux uropathy, unspecified: Secondary | ICD-10-CM | POA: Diagnosis not present

## 2013-07-19 DIAGNOSIS — E291 Testicular hypofunction: Secondary | ICD-10-CM | POA: Diagnosis not present

## 2013-07-19 DIAGNOSIS — N401 Enlarged prostate with lower urinary tract symptoms: Secondary | ICD-10-CM | POA: Diagnosis not present

## 2013-08-29 DIAGNOSIS — H4011X0 Primary open-angle glaucoma, stage unspecified: Secondary | ICD-10-CM | POA: Diagnosis not present

## 2013-08-29 DIAGNOSIS — H353190 Nonexudative age-related macular degeneration, unspecified eye, stage unspecified: Secondary | ICD-10-CM | POA: Diagnosis not present

## 2013-09-18 DIAGNOSIS — I2510 Atherosclerotic heart disease of native coronary artery without angina pectoris: Secondary | ICD-10-CM | POA: Diagnosis not present

## 2013-09-18 DIAGNOSIS — I10 Essential (primary) hypertension: Secondary | ICD-10-CM | POA: Diagnosis not present

## 2013-09-18 DIAGNOSIS — E785 Hyperlipidemia, unspecified: Secondary | ICD-10-CM | POA: Diagnosis not present

## 2013-09-18 DIAGNOSIS — R0602 Shortness of breath: Secondary | ICD-10-CM | POA: Diagnosis not present

## 2013-09-22 DIAGNOSIS — M4120 Other idiopathic scoliosis, site unspecified: Secondary | ICD-10-CM | POA: Diagnosis not present

## 2013-09-22 DIAGNOSIS — S239XXA Sprain of unspecified parts of thorax, initial encounter: Secondary | ICD-10-CM | POA: Diagnosis not present

## 2013-09-22 DIAGNOSIS — M47814 Spondylosis without myelopathy or radiculopathy, thoracic region: Secondary | ICD-10-CM | POA: Diagnosis not present

## 2013-11-14 DIAGNOSIS — I2510 Atherosclerotic heart disease of native coronary artery without angina pectoris: Secondary | ICD-10-CM | POA: Diagnosis not present

## 2013-11-14 DIAGNOSIS — R0602 Shortness of breath: Secondary | ICD-10-CM | POA: Diagnosis not present

## 2013-11-26 DIAGNOSIS — J209 Acute bronchitis, unspecified: Secondary | ICD-10-CM | POA: Diagnosis not present

## 2014-01-15 DIAGNOSIS — I10 Essential (primary) hypertension: Secondary | ICD-10-CM | POA: Diagnosis not present

## 2014-01-15 DIAGNOSIS — E785 Hyperlipidemia, unspecified: Secondary | ICD-10-CM | POA: Diagnosis not present

## 2014-01-15 DIAGNOSIS — I480 Paroxysmal atrial fibrillation: Secondary | ICD-10-CM | POA: Diagnosis not present

## 2014-01-15 DIAGNOSIS — I2510 Atherosclerotic heart disease of native coronary artery without angina pectoris: Secondary | ICD-10-CM | POA: Diagnosis not present

## 2014-01-15 DIAGNOSIS — Z79899 Other long term (current) drug therapy: Secondary | ICD-10-CM | POA: Diagnosis not present

## 2014-01-15 DIAGNOSIS — R0602 Shortness of breath: Secondary | ICD-10-CM | POA: Diagnosis not present

## 2014-01-22 DIAGNOSIS — Z0000 Encounter for general adult medical examination without abnormal findings: Secondary | ICD-10-CM | POA: Diagnosis not present

## 2014-02-12 DIAGNOSIS — I480 Paroxysmal atrial fibrillation: Secondary | ICD-10-CM | POA: Diagnosis not present

## 2014-02-12 DIAGNOSIS — K868 Other specified diseases of pancreas: Secondary | ICD-10-CM | POA: Diagnosis not present

## 2014-02-12 DIAGNOSIS — I2510 Atherosclerotic heart disease of native coronary artery without angina pectoris: Secondary | ICD-10-CM | POA: Diagnosis not present

## 2014-02-12 DIAGNOSIS — I10 Essential (primary) hypertension: Secondary | ICD-10-CM | POA: Diagnosis not present

## 2014-02-12 DIAGNOSIS — R310 Gross hematuria: Secondary | ICD-10-CM | POA: Diagnosis not present

## 2014-02-12 DIAGNOSIS — R0602 Shortness of breath: Secondary | ICD-10-CM | POA: Diagnosis not present

## 2014-02-12 DIAGNOSIS — K8020 Calculus of gallbladder without cholecystitis without obstruction: Secondary | ICD-10-CM | POA: Diagnosis not present

## 2014-02-12 DIAGNOSIS — Z79899 Other long term (current) drug therapy: Secondary | ICD-10-CM | POA: Diagnosis not present

## 2014-02-12 DIAGNOSIS — N200 Calculus of kidney: Secondary | ICD-10-CM | POA: Diagnosis not present

## 2014-02-12 DIAGNOSIS — N2889 Other specified disorders of kidney and ureter: Secondary | ICD-10-CM | POA: Diagnosis not present

## 2014-02-12 DIAGNOSIS — E785 Hyperlipidemia, unspecified: Secondary | ICD-10-CM | POA: Diagnosis not present

## 2014-02-20 DIAGNOSIS — N200 Calculus of kidney: Secondary | ICD-10-CM | POA: Diagnosis not present

## 2014-02-20 DIAGNOSIS — R310 Gross hematuria: Secondary | ICD-10-CM | POA: Diagnosis not present

## 2014-02-20 DIAGNOSIS — N3020 Other chronic cystitis without hematuria: Secondary | ICD-10-CM | POA: Diagnosis not present

## 2014-02-20 DIAGNOSIS — N390 Urinary tract infection, site not specified: Secondary | ICD-10-CM | POA: Diagnosis not present

## 2014-02-26 ENCOUNTER — Other Ambulatory Visit: Payer: Self-pay | Admitting: Urology

## 2014-02-27 NOTE — Progress Notes (Signed)
Called Dr. Arlyn Leak office requested orders be released to sign and held surgery 03-08-14 pre op 03-02-14 Thanks

## 2014-03-02 ENCOUNTER — Encounter (HOSPITAL_COMMUNITY): Payer: Self-pay

## 2014-03-02 ENCOUNTER — Encounter (HOSPITAL_COMMUNITY)
Admission: RE | Admit: 2014-03-02 | Discharge: 2014-03-02 | Disposition: A | Discharge status: Home / Self Care | Payer: Medicare Other | Source: Ambulatory Visit | Attending: Urology | Admitting: Urology

## 2014-03-02 DIAGNOSIS — Z01818 Encounter for other preprocedural examination: Secondary | ICD-10-CM | POA: Diagnosis not present

## 2014-03-02 DIAGNOSIS — N200 Calculus of kidney: Secondary | ICD-10-CM | POA: Insufficient documentation

## 2014-03-02 HISTORY — DX: Cardiac arrhythmia, unspecified: I49.9

## 2014-03-02 LAB — BASIC METABOLIC PANEL
ANION GAP: 6 (ref 5–15)
BUN: 19 mg/dL (ref 6–23)
CALCIUM: 9.3 mg/dL (ref 8.4–10.5)
CHLORIDE: 106 mmol/L (ref 96–112)
CO2: 32 mmol/L (ref 19–32)
Creatinine, Ser: 0.93 mg/dL (ref 0.50–1.35)
GFR calc Af Amer: >90 mL/min (ref >90)
GFR calc non Af Amer: 84 mL/min — ABNORMAL LOW (ref >90)
GLUCOSE: 105 mg/dL — AB (ref 70–99)
Potassium: 4.2 mmol/L (ref 3.5–5.1)
SODIUM: 144 mmol/L (ref 135–145)

## 2014-03-02 LAB — CBC
HCT: 47.2 % (ref 39.0–52.0)
Hemoglobin: 15.4 g/dL (ref 13.0–17.0)
MCH: 31.9 pg (ref 26.0–34.0)
MCHC: 32.6 g/dL (ref 30.0–36.0)
MCV: 97.7 fL (ref 78.0–100.0)
Platelets: 212 10*3/uL (ref 150–400)
RBC: 4.83 MIL/uL (ref 4.22–5.81)
RDW: 12.4 % (ref 11.5–15.5)
WBC: 7.5 10*3/uL (ref 4.0–10.5)

## 2014-03-02 NOTE — Patient Instructions (Addendum)
[place]  03/02/2014   Your procedure is scheduled on:      Thursday, March 08, 2014   Report to [facility] Main Entrance and follow signs to  [place] arrive at 0700 AM.  Call this number if you have problems the morning of surgery [phone] or Presurgical Testing [phone].   Remember:  Do not eat food or drink liquids :After Midnight.  For Living Will and/or Health Care Power Attorney Forms: please provide copy for your medical record, may bring AM of surgery (forms should be already notarized-we do not provide this service).  For Cpap use: bring mask and tubing only.     Take these medicines the morning of surgery with A SIP OF WATER: Carvedilol (Coreg);Omeprazole (Prilosec);Flonase if needed bring with you day of surgery;Timolol eye drops - bring with you day of surgery                               You may not have any metal on your body including hair pins and piercings  Do not wear jewelry,lotions, powders, colognes or deodorant.  Do not shave body hair  48 hours(2 days) of CHG soap use. Men may shave face and neck.               Do not bring valuables to the hospital. [place].  Contacts, dentures or bridgework may not be worn into surgery.  Leave suitcase in the car. After surgery it may be brought to your room.  For patients admitted to the hospital, checkout time is 11:00 AM the day of discharge.    ________________________________________________________________________  [facility] - Preparing for Surgery Before surgery, you can play an important role.  Because skin is not sterile, your skin needs to be as free of germs as possible.  You can reduce the number of germs on your skin by washing with CHG (chlorahexidine gluconate) soap before surgery.  CHG is an antiseptic cleaner which kills germs and bonds with the skin to continue killing germs even after washing. Please DO NOT use if you have an  allergy to CHG or antibacterial soaps.  If your skin becomes reddened/irritated stop using the CHG and inform your nurse when you arrive at Short Stay. Do not shave (including legs and underarms) for at least 48 hours prior to the first CHG shower.  You may shave your face/neck. Please follow these instructions carefully:  1.  Shower with CHG Soap the night before surgery and the  morning of Surgery.  2.  If you choose to wash your hair, wash your hair first as usual with your  normal  shampoo.  3.  After you shampoo, rinse your hair and body thoroughly to remove the  shampoo.                           4.  Use CHG as you would any other liquid soap.  You can apply chg directly  to the skin and wash                       Gently with a scrungie or clean washcloth.  5.  Apply the CHG Soap to your body ONLY FROM THE NECK DOWN.   Do not use on face/ open  Wound or open sores. Avoid contact with eyes, ears mouth and genitals (private parts).                       Wash face,  Genitals (private parts) with your normal soap.             6.  Wash thoroughly, paying special attention to the area where your surgery  will be performed.  7.  Thoroughly rinse your body with warm water from the neck down.  8.  DO NOT shower/wash with your normal soap after using and rinsing off  the CHG Soap.                9.  Pat yourself dry with a clean towel.            10.  Wear clean pajamas.            11.  Place clean sheets on your bed the night of your first shower and do not  sleep with pets. Day of Surgery : Do not apply any lotions/deodorants the morning of surgery.  Please wear clean clothes to the hospital/surgery center.  FAILURE TO FOLLOW THESE INSTRUCTIONS MAY RESULT IN THE CANCELLATION OF YOUR SURGERY PATIENT SIGNATURE_________________________________  NURSE  SIGNATURE__________________________________  ________________________________________________________________________   Adam Phenix  An incentive spirometer is a tool that can help keep your lungs clear and active. This tool measures how well you are filling your lungs with each breath. Taking long deep breaths may help reverse or decrease the chance of developing breathing (pulmonary) problems (especially infection) following:  A long period of time when you are unable to move or be active. BEFORE THE PROCEDURE   If the spirometer includes an indicator to show your best effort, your nurse or respiratory therapist will set it to a desired goal.  If possible, sit up straight or lean slightly forward. Try not to slouch.  Hold the incentive spirometer in an upright position. INSTRUCTIONS FOR USE  1. Sit on the edge of your bed if possible, or sit up as far as you can in bed or on a chair. 2. Hold the incentive spirometer in an upright position. 3. Breathe out normally. 4. Place the mouthpiece in your mouth and seal your lips tightly around it. 5. Breathe in slowly and as deeply as possible, raising the piston or the ball toward the top of the column. 6. Hold your breath for 3-5 seconds or for as long as possible. Allow the piston or ball to fall to the bottom of the column. 7. Remove the mouthpiece from your mouth and breathe out normally. 8. Rest for a few seconds and repeat Steps 1 through 7 at least 10 times every 1-2 hours when you are awake. Take your time and take a few normal breaths between deep breaths. 9. The spirometer may include an indicator to show your best effort. Use the indicator as a goal to work toward during each repetition. 10. After each set of 10 deep breaths, practice coughing to be sure your lungs are clear. If you have an incision (the cut made at the time of surgery), support your incision when coughing by placing a pillow or rolled up towels firmly  against it. Once you are able to get out of bed, walk around indoors and cough well. You may stop using the incentive spirometer when instructed by your caregiver.  RISKS AND COMPLICATIONS  Take your time so you do not  get dizzy or light-headed.  If you are in pain, you may need to take or ask for pain medication before doing incentive spirometry. It is harder to take a deep breath if you are having pain. AFTER USE  Rest and breathe slowly and easily.  It can be helpful to keep track of a log of your progress. Your caregiver can provide you with a simple table to help with this. If you are using the spirometer at home, follow these instructions: [place] IF:   You are having difficultly using the spirometer.  You have trouble using the spirometer as often as instructed.  Your pain medication is not giving enough relief while using the spirometer.  You develop fever of 100.5 F (38.1 C) or higher. SEEK IMMEDIATE MEDICAL CARE IF:   You cough up bloody sputum that had not been present before.  You develop fever of 102 F (38.9 C) or greater.  You develop worsening pain at or near the incision site. MAKE SURE YOU:   Understand these instructions.  Will watch your condition.  Will get help right away if you are not doing well or get worse. Document Released: 06/01/2006 Document Revised: 04/13/2011 Document Reviewed: 08/02/2006 [facility] Patient Information 2014 [place], [place].   ________________________________________________________________________

## 2014-03-02 NOTE — Progress Notes (Addendum)
EKG per chart 01/15/2014  CXR per epic 04/14/2013  LOV Dr Agustin Cree 02/18/2014 per chart  Spoke with Dr Landry Dyke with anesthesia in regards to pts hx of left ventricular dysfunction reported EKG results and CXR results and current medications no further orders noted

## 2014-03-08 ENCOUNTER — Ambulatory Visit (HOSPITAL_COMMUNITY): Payer: Medicare Other | Admitting: Certified Registered Nurse Anesthetist

## 2014-03-08 ENCOUNTER — Encounter (HOSPITAL_COMMUNITY)
Admission: RE | Disposition: A | Discharge status: Home / Self Care | Payer: Self-pay | Source: Ambulatory Visit | Attending: Urology

## 2014-03-08 ENCOUNTER — Encounter (HOSPITAL_COMMUNITY): Payer: Self-pay | Admitting: *Deleted

## 2014-03-08 ENCOUNTER — Observation Stay (HOSPITAL_COMMUNITY)
Admission: RE | Admit: 2014-03-08 | Discharge: 2014-03-09 | Disposition: A | Discharge status: Home / Self Care | Payer: Medicare Other | Source: Ambulatory Visit | Attending: Urology | Admitting: Urology

## 2014-03-08 DIAGNOSIS — E780 Pure hypercholesterolemia: Secondary | ICD-10-CM | POA: Insufficient documentation

## 2014-03-08 DIAGNOSIS — N139 Obstructive and reflux uropathy, unspecified: Secondary | ICD-10-CM | POA: Diagnosis not present

## 2014-03-08 DIAGNOSIS — E669 Obesity, unspecified: Secondary | ICD-10-CM | POA: Diagnosis not present

## 2014-03-08 DIAGNOSIS — E559 Vitamin D deficiency, unspecified: Secondary | ICD-10-CM | POA: Insufficient documentation

## 2014-03-08 DIAGNOSIS — Z79899 Other long term (current) drug therapy: Secondary | ICD-10-CM | POA: Diagnosis not present

## 2014-03-08 DIAGNOSIS — N401 Enlarged prostate with lower urinary tract symptoms: Secondary | ICD-10-CM | POA: Insufficient documentation

## 2014-03-08 DIAGNOSIS — E291 Testicular hypofunction: Secondary | ICD-10-CM | POA: Insufficient documentation

## 2014-03-08 DIAGNOSIS — I10 Essential (primary) hypertension: Secondary | ICD-10-CM | POA: Diagnosis not present

## 2014-03-08 DIAGNOSIS — R310 Gross hematuria: Secondary | ICD-10-CM | POA: Insufficient documentation

## 2014-03-08 DIAGNOSIS — I4891 Unspecified atrial fibrillation: Secondary | ICD-10-CM

## 2014-03-08 DIAGNOSIS — N529 Male erectile dysfunction, unspecified: Secondary | ICD-10-CM | POA: Diagnosis not present

## 2014-03-08 DIAGNOSIS — Z7982 Long term (current) use of aspirin: Secondary | ICD-10-CM | POA: Insufficient documentation

## 2014-03-08 DIAGNOSIS — I2510 Atherosclerotic heart disease of native coronary artery without angina pectoris: Secondary | ICD-10-CM | POA: Diagnosis not present

## 2014-03-08 DIAGNOSIS — I482 Chronic atrial fibrillation: Secondary | ICD-10-CM | POA: Diagnosis not present

## 2014-03-08 DIAGNOSIS — N200 Calculus of kidney: Secondary | ICD-10-CM | POA: Diagnosis not present

## 2014-03-08 DIAGNOSIS — G4730 Sleep apnea, unspecified: Secondary | ICD-10-CM | POA: Diagnosis not present

## 2014-03-08 DIAGNOSIS — K219 Gastro-esophageal reflux disease without esophagitis: Secondary | ICD-10-CM | POA: Diagnosis not present

## 2014-03-08 HISTORY — PX: CYSTOSCOPY WITH RETROGRADE PYELOGRAM, URETEROSCOPY AND STENT PLACEMENT: SHX5789

## 2014-03-08 HISTORY — DX: Unspecified atrial fibrillation: I48.91

## 2014-03-08 HISTORY — PX: HOLMIUM LASER APPLICATION: SHX5852

## 2014-03-08 LAB — CBC
HCT: 43.9 % (ref 39.0–52.0)
Hemoglobin: 14.4 g/dL (ref 13.0–17.0)
MCH: 32.1 pg (ref 26.0–34.0)
MCHC: 32.8 g/dL (ref 30.0–36.0)
MCV: 97.8 fL (ref 78.0–100.0)
PLATELETS: 213 10*3/uL (ref 150–400)
RBC: 4.49 MIL/uL (ref 4.22–5.81)
RDW: 12.4 % (ref 11.5–15.5)
WBC: 6.1 10*3/uL (ref 4.0–10.5)

## 2014-03-08 LAB — BASIC METABOLIC PANEL
Anion gap: 5 (ref 5–15)
BUN: 20 mg/dL (ref 6–23)
CO2: 31 mmol/L (ref 19–32)
Calcium: 8.7 mg/dL (ref 8.4–10.5)
Chloride: 110 mmol/L (ref 96–112)
Creatinine, Ser: 0.9 mg/dL (ref 0.50–1.35)
GFR calc Af Amer: >90 mL/min (ref >90)
GFR calc non Af Amer: 85 mL/min — ABNORMAL LOW (ref >90)
GLUCOSE: 108 mg/dL — AB (ref 70–99)
Potassium: 3.9 mmol/L (ref 3.5–5.1)
SODIUM: 146 mmol/L — AB (ref 135–145)

## 2014-03-08 SURGERY — CYSTOURETEROSCOPY, WITH RETROGRADE PYELOGRAM AND STENT INSERTION
Anesthesia: General | Laterality: Left

## 2014-03-08 MED ORDER — CEFAZOLIN SODIUM-DEXTROSE 2-3 GM-% IV SOLR
2.0000 g | INTRAVENOUS | Status: AC
  Administered 2014-03-08: 2 g via INTRAVENOUS

## 2014-03-08 MED ORDER — EPHEDRINE SULFATE 50 MG/ML IJ SOLN
INTRAMUSCULAR | Status: DC | PRN
  Administered 2014-03-08 (×2): 5 mg via INTRAVENOUS

## 2014-03-08 MED ORDER — IOHEXOL 300 MG/ML  SOLN
INTRAMUSCULAR | Status: DC | PRN
  Administered 2014-03-08: 5 mL

## 2014-03-08 MED ORDER — BENAZEPRIL HCL 40 MG PO TABS
40.0000 mg | ORAL_TABLET | Freq: Every day | ORAL | Status: DC
  Administered 2014-03-09: 40 mg via ORAL
  Filled 2014-03-08 (×2): qty 1

## 2014-03-08 MED ORDER — DIPHENHYDRAMINE HCL 12.5 MG/5ML PO ELIX
12.5000 mg | ORAL_SOLUTION | Freq: Four times a day (QID) | ORAL | Status: DC | PRN
Start: 2014-03-08 — End: 2014-03-09

## 2014-03-08 MED ORDER — PROMETHAZINE HCL 25 MG/ML IJ SOLN: 6.2500 mg | INTRAMUSCULAR | Status: DC | PRN

## 2014-03-08 MED ORDER — SENNA 8.6 MG PO TABS
1.0000 | ORAL_TABLET | Freq: Two times a day (BID) | ORAL | Status: DC
Start: 2014-03-08 — End: 2014-03-09
  Administered 2014-03-08 – 2014-03-09 (×2): 8.6 mg via ORAL
  Filled 2014-03-08 (×2): qty 1

## 2014-03-08 MED ORDER — AMLODIPINE BESYLATE 5 MG PO TABS
5.0000 mg | ORAL_TABLET | Freq: Every day | ORAL | Status: DC
  Administered 2014-03-09: 5 mg via ORAL
  Filled 2014-03-08 (×2): qty 1

## 2014-03-08 MED ORDER — SODIUM CHLORIDE 0.9 % IJ SOLN
INTRAMUSCULAR | Status: AC
  Filled 2014-03-08: qty 10

## 2014-03-08 MED ORDER — SODIUM CHLORIDE 0.9 % IV SOLN
10.0000 mg | INTRAVENOUS | Status: DC | PRN
  Administered 2014-03-08: 80 ug/min via INTRAVENOUS

## 2014-03-08 MED ORDER — CIPROFLOXACIN HCL 500 MG PO TABS
500.0000 mg | ORAL_TABLET | Freq: Two times a day (BID) | ORAL | Status: DC
  Administered 2014-03-08 – 2014-03-09 (×2): 500 mg via ORAL
  Filled 2014-03-08 (×2): qty 1

## 2014-03-08 MED ORDER — TIMOLOL MALEATE 0.5 % OP SOLN
1.0000 [drp] | Freq: Two times a day (BID) | OPHTHALMIC | Status: DC
  Administered 2014-03-08 – 2014-03-09 (×2): 1 [drp] via OPHTHALMIC
  Filled 2014-03-08: qty 5

## 2014-03-08 MED ORDER — ONDANSETRON HCL 4 MG/2ML IJ SOLN
INTRAMUSCULAR | Status: AC
  Filled 2014-03-08: qty 2

## 2014-03-08 MED ORDER — SODIUM CHLORIDE 0.9 % IR SOLN
Status: DC | PRN
  Administered 2014-03-08: 3000 mL via INTRAVESICAL
  Administered 2014-03-08: 2000 mL via INTRAVESICAL

## 2014-03-08 MED ORDER — FLUTICASONE PROPIONATE 50 MCG/ACT NA SUSP
1.0000 | Freq: Every day | NASAL | Status: DC | PRN
  Filled 2014-03-08: qty 16

## 2014-03-08 MED ORDER — ONDANSETRON HCL 4 MG/2ML IJ SOLN
INTRAMUSCULAR | Status: DC | PRN
  Administered 2014-03-08 (×2): 2 mg via INTRAVENOUS

## 2014-03-08 MED ORDER — HYDROMORPHONE HCL 1 MG/ML IJ SOLN: 0.5000 mg | INTRAMUSCULAR | Status: DC | PRN

## 2014-03-08 MED ORDER — BELLADONNA ALKALOIDS-OPIUM 16.2-60 MG RE SUPP
RECTAL | Status: AC
  Filled 2014-03-08: qty 1

## 2014-03-08 MED ORDER — KETOROLAC TROMETHAMINE 30 MG/ML IJ SOLN
INTRAMUSCULAR | Status: DC | PRN
  Administered 2014-03-08: 30 mg via INTRAVENOUS

## 2014-03-08 MED ORDER — LACTATED RINGERS IV SOLN
INTRAVENOUS | Status: DC
  Administered 2014-03-08: 10:00:00 via INTRAVENOUS
  Administered 2014-03-08: 1000 mL via INTRAVENOUS

## 2014-03-08 MED ORDER — ACETAMINOPHEN 10 MG/ML IV SOLN
1000.0000 mg | Freq: Once | INTRAVENOUS | Status: AC
Start: 2014-03-08 — End: 2014-03-08
  Administered 2014-03-08: 1000 mg via INTRAVENOUS
  Filled 2014-03-08: qty 100

## 2014-03-08 MED ORDER — MIDAZOLAM HCL 5 MG/5ML IJ SOLN
INTRAMUSCULAR | Status: DC | PRN
  Administered 2014-03-08: 1 mg via INTRAVENOUS

## 2014-03-08 MED ORDER — ACETAMINOPHEN 325 MG PO TABS: 650.0000 mg | ORAL_TABLET | ORAL | Status: DC | PRN

## 2014-03-08 MED ORDER — FENTANYL CITRATE 0.05 MG/ML IJ SOLN
INTRAMUSCULAR | Status: AC
  Filled 2014-03-08: qty 5

## 2014-03-08 MED ORDER — NABUMETONE 500 MG PO TABS
500.0000 mg | ORAL_TABLET | Freq: Two times a day (BID) | ORAL | Status: DC | PRN
  Filled 2014-03-08: qty 1

## 2014-03-08 MED ORDER — LIDOCAINE HCL (CARDIAC) 20 MG/ML IV SOLN
INTRAVENOUS | Status: AC
  Filled 2014-03-08: qty 5

## 2014-03-08 MED ORDER — FENTANYL CITRATE 0.05 MG/ML IJ SOLN: 25.0000 ug | INTRAMUSCULAR | Status: DC | PRN

## 2014-03-08 MED ORDER — OXYCODONE-ACETAMINOPHEN 5-325 MG PO TABS
1.0000 | ORAL_TABLET | ORAL | Status: DC | PRN
  Administered 2014-03-08: 1 via ORAL
  Administered 2014-03-09 (×2): 2 via ORAL
  Filled 2014-03-08 (×3): qty 2

## 2014-03-08 MED ORDER — HYOSCYAMINE SULFATE 0.125 MG SL SUBL
0.1250 mg | SUBLINGUAL_TABLET | SUBLINGUAL | Status: DC | PRN
  Filled 2014-03-08: qty 1

## 2014-03-08 MED ORDER — ENOXAPARIN SODIUM 40 MG/0.4ML ~~LOC~~ SOLN
40.0000 mg | SUBCUTANEOUS | Status: DC
  Administered 2014-03-08: 40 mg via SUBCUTANEOUS
  Filled 2014-03-08 (×2): qty 0.4

## 2014-03-08 MED ORDER — CEFAZOLIN SODIUM-DEXTROSE 2-3 GM-% IV SOLR
INTRAVENOUS | Status: AC
  Filled 2014-03-08: qty 50

## 2014-03-08 MED ORDER — OMEGA-3-ACID ETHYL ESTERS 1 G PO CAPS
2.0000 g | ORAL_CAPSULE | Freq: Two times a day (BID) | ORAL | Status: DC
  Administered 2014-03-08 – 2014-03-09 (×2): 2 g via ORAL
  Filled 2014-03-08 (×2): qty 2

## 2014-03-08 MED ORDER — LIDOCAINE HCL (CARDIAC) 20 MG/ML IV SOLN
INTRAVENOUS | Status: DC | PRN
  Administered 2014-03-08: 75 mg via INTRAVENOUS

## 2014-03-08 MED ORDER — DIPHENHYDRAMINE HCL 50 MG/ML IJ SOLN: 12.5000 mg | Freq: Four times a day (QID) | INTRAMUSCULAR | Status: DC | PRN

## 2014-03-08 MED ORDER — PANTOPRAZOLE SODIUM 40 MG PO TBEC
40.0000 mg | DELAYED_RELEASE_TABLET | Freq: Every day | ORAL | Status: DC
  Administered 2014-03-09: 40 mg via ORAL
  Filled 2014-03-08: qty 1

## 2014-03-08 MED ORDER — MIDAZOLAM HCL 2 MG/2ML IJ SOLN
INTRAMUSCULAR | Status: AC
  Filled 2014-03-08: qty 2

## 2014-03-08 MED ORDER — ATORVASTATIN CALCIUM 40 MG PO TABS
40.0000 mg | ORAL_TABLET | Freq: Every day | ORAL | Status: DC
  Administered 2014-03-08: 40 mg via ORAL
  Filled 2014-03-08: qty 1

## 2014-03-08 MED ORDER — CARVEDILOL 25 MG PO TABS
25.0000 mg | ORAL_TABLET | Freq: Two times a day (BID) | ORAL | Status: DC
  Administered 2014-03-09: 25 mg via ORAL
  Filled 2014-03-08: qty 1

## 2014-03-08 MED ORDER — METOCLOPRAMIDE HCL 5 MG/ML IJ SOLN
INTRAMUSCULAR | Status: DC | PRN
  Administered 2014-03-08: 5 mg via INTRAVENOUS

## 2014-03-08 MED ORDER — PROPOFOL 10 MG/ML IV BOLUS
INTRAVENOUS | Status: AC
  Filled 2014-03-08: qty 20

## 2014-03-08 MED ORDER — SODIUM CHLORIDE 0.45 % IV SOLN
INTRAVENOUS | Status: DC
  Administered 2014-03-08 – 2014-03-09 (×2): via INTRAVENOUS

## 2014-03-08 MED ORDER — BELLADONNA ALKALOIDS-OPIUM 16.2-60 MG RE SUPP: RECTAL | Status: DC | PRN

## 2014-03-08 MED ORDER — NAPROXEN SODIUM 275 MG PO TABS
275.0000 mg | ORAL_TABLET | Freq: Two times a day (BID) | ORAL | Status: DC
Start: 2014-03-08 — End: 2014-03-09
  Administered 2014-03-08 – 2014-03-09 (×2): 275 mg via ORAL
  Filled 2014-03-08 (×3): qty 1

## 2014-03-08 MED ORDER — AMLODIPINE BESY-BENAZEPRIL HCL 5-40 MG PO CAPS: 1.0000 | ORAL_CAPSULE | Freq: Every morning | ORAL | Status: DC

## 2014-03-08 MED ORDER — PHENYLEPHRINE HCL 10 MG/ML IJ SOLN
INTRAMUSCULAR | Status: DC | PRN
  Administered 2014-03-08 (×3): 40 ug via INTRAVENOUS
  Administered 2014-03-08: 80 ug via INTRAVENOUS

## 2014-03-08 MED ORDER — PROPOFOL 10 MG/ML IV BOLUS
INTRAVENOUS | Status: DC | PRN
  Administered 2014-03-08: 190 mg via INTRAVENOUS

## 2014-03-08 MED ORDER — FENTANYL CITRATE 0.05 MG/ML IJ SOLN
INTRAMUSCULAR | Status: DC | PRN
  Administered 2014-03-08: 50 ug via INTRAVENOUS

## 2014-03-08 MED ORDER — FUROSEMIDE 40 MG PO TABS
40.0000 mg | ORAL_TABLET | ORAL | Status: DC
  Administered 2014-03-08: 40 mg via ORAL
  Filled 2014-03-08: qty 1

## 2014-03-08 MED ORDER — AMLODIPINE BESYLATE 5 MG PO TABS
5.0000 mg | ORAL_TABLET | Freq: Every day | ORAL | Status: DC
  Administered 2014-03-08: 5 mg via ORAL
  Filled 2014-03-08: qty 1

## 2014-03-08 MED ORDER — DEXAMETHASONE SODIUM PHOSPHATE 10 MG/ML IJ SOLN
INTRAMUSCULAR | Status: AC
  Filled 2014-03-08: qty 1

## 2014-03-08 SURGICAL SUPPLY — 22 items
BAG URO CATCHER STRL LF (DRAPE) ×2 IMPLANT
BASKET ZERO TIP NITINOL 2.4FR (BASKET) ×4 IMPLANT
CATH INTERMIT  6FR 70CM (CATHETERS) ×2 IMPLANT
CLOTH BEACON ORANGE TIMEOUT ST (SAFETY) ×2 IMPLANT
FIBER LASER FLEXIVA 1000 (UROLOGICAL SUPPLIES) IMPLANT
FIBER LASER FLEXIVA 200 (UROLOGICAL SUPPLIES) ×2 IMPLANT
FIBER LASER FLEXIVA 365 (UROLOGICAL SUPPLIES) IMPLANT
FIBER LASER FLEXIVA 550 (UROLOGICAL SUPPLIES) IMPLANT
FIBER LASER TRAC TIP (UROLOGICAL SUPPLIES) ×2 IMPLANT
GLOVE BIOGEL M STRL SZ7.5 (GLOVE) ×2 IMPLANT
GOWN STRL REUS W/TWL XL LVL3 (GOWN DISPOSABLE) ×2 IMPLANT
GUIDEWIRE ANG ZIPWIRE 038X150 (WIRE) ×2 IMPLANT
GUIDEWIRE STR DUAL SENSOR (WIRE) ×2 IMPLANT
MANIFOLD NEPTUNE II (INSTRUMENTS) ×2 IMPLANT
NS IRRIG 1000ML POUR BTL (IV SOLUTION) ×2 IMPLANT
PACK CYSTO (CUSTOM PROCEDURE TRAY) ×2 IMPLANT
SCRUB PCMX 4 OZ (MISCELLANEOUS) ×2 IMPLANT
SHEATH ACCESS URETERAL 38CM (SHEATH) ×2 IMPLANT
SHIELD EYE BINOCULAR (MISCELLANEOUS) IMPLANT
STENT URET 6FRX24 CONTOUR (STENTS) ×2 IMPLANT
SYRINGE IRR TOOMEY STRL 70CC (SYRINGE) ×2 IMPLANT
TUBING CONNECTING 10 (TUBING) ×2 IMPLANT

## 2014-03-08 NOTE — H&P (Signed)
Reason For Visit F/u after CT   Active Problems Problems  1. Benign localized hyperplasia of prostate with urinary obstruction  (N40.1,N13.8)   Assessed By: Carolan Clines (Urology); Last Assessed: 19 Jul 2013 2. Bilateral kidney stones (N20.0) 3. Chronic cystitis (N30.20)   Assessed By: Carolan Clines (Urology); Last Assessed: 20 Feb 2014 4. 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 Gross hematuria (R31.0)   Assessed By: Carolan Clines (Urology); Last Assessed: 20 Feb 2014 6. Hypogonadism, testicular (E29.1)   Assessed By: Carolan Clines (Urology); Last Assessed: 19 Jul 2013 7. Microscopic hematuria (R31.2) 8. Nephrolithiasis (N20.0) 9. Prostate cancer screening (Z12.5) 10. Prostatitis (N41.9) 11. Pyuria (N39.0)   Assessed By: Carolan Clines (Urology); Last Assessed: 20 Feb 2014  History of Present Illness    [name] returns today to review CT results & discuss ESWL. He was started on Xarelto on 01/23/14 for Atrial Fib. and the next day started having gross hematuria. He was also taking Aleve with Xarelto. Cardiologist advised to stop the Xarelto.     Hx of ED & hypogonadism. He is s/p cysto, urethral dilatation, and TURP 04/20/13. He states that he is able to urinate standing up now & procedure has been a great success.  Pathology: bengin prostatic tissue.      Hx of ED, hypogonadism, kidney stones, elevated PSA, urethral stricture & Vit D deficiency. Patient states that Dr. Linna Darner placed him on Cialis 2.5mg .    He was taking that along with [place], and was doing well. He states that his flow was slow until his TURP. He is s/p cystourethroscopy, urethral dilatation, R RPG, for urethral stricture and microscopic hematuria 06/12/11. In December 2013. .    10/14/11 Testosterone - 474.93   Past Medical  History Problems  1. History of Acute cystitis without hematuria (N30.00) 2. History of Arthritis 3. History of Dysuria (R30.0) 4. History of Exogenous Obesity 5. History of acute prostatitis (Z87.438) 6. History of hypercholesterolemia (Z86.39) 7. History of hypertension (Z86.79) 8. History of sleep apnea (Z87.09) 9. History of Murmur (R01.1)  Surgical History Problems  1. History of Appendectomy 2. History of Cystoscopy (Diagnostic) 3. History of Cystoscopy For Urethral Stricture 4. History of Dilat Male Caryn Section Strict By Arlyce Dice Dilat Initial 5. History of Inguinal Hernia Repair 6. History of Lithotripsy 7. History of Primary Repair Of Ruptured Achilles Tendon 8. History of Transurethral Incision Of Prostate 9. History of Transurethral Resection Of Prostate (TURP) 10. History of Urethra Surgery  Current Meds 1. Aspirin 81 MG Oral Tablet;  Therapy: (Recorded:06May2008) to Recorded 2. Carvedilol 25 MG Oral Tablet;  Therapy: (Recorded:01Mar2013) to Recorded 3. Fish Oil CAPS;  Therapy: (Recorded:14Oct2010) to Recorded 4. Fortesta 10 MG/ACT (2%) Transdermal Gel; Apply 4 pumps daily (2 pumps  to each thigh, front and inner part);  Therapy: [id number] to (Last Rx:18Jun2015) Ordered 5. Furosemide 40 MG Oral Tablet;  Therapy: (Recorded:16Nov2011) to Recorded 6. Lipitor 40 MG Oral Tablet;  Therapy: (Recorded:01Mar2013) to Recorded 7. Lotrel 5-40 MG CAPS;  Therapy: (Recorded:01Mar2013) to Recorded 8. Multi-Vitamin Oral Tablet;  Therapy: (Recorded:06May2008) to Recorded 9. Omeprazole 20 MG Oral Capsule Delayed Release;  Therapy: (Recorded:18Dec2007) to Recorded 10. Viagra 100 MG Oral Tablet; TAKE 1 TABLET As Directed;   Therapy: [id number] to (Last Rx:18Jun2015)  Requested for: [phone]  Ordered 11. Vitamin C 500 MG Oral Tablet;   Therapy: (Recorded:01Mar2013) to Recorded 12. Vitamin D 50000 UNIT CAPS; TAKE 1 CAPSULE WEEKLY;   Therapy: 18Aug2009 to (Last  Rx:18Aug2009) Ordered 13. Xarelto TABS;   Therapy: (Recorded:19Jan2016) to Recorded  Allergies Medication  1. Hydrocodone-Acetaminophen CAPS  Family History Problems  1. Family history of Aneurysm Of The Abdominal Aorta : Father 2. Family history of Bladder Cancer : Father 3. Family history of Family Health Status Number Of Children   1 son, 1 daughter 4. Family history of Renal Failure : Father 5. Family history of Urologic Disorder : Father   Blood in urine  Social History Problems  1. Denied: Alcohol Use 2. Denied: Caffeine Use 3. Family history of Death In The Family Father   Age 26 4. Family history of Death In The Family Mother   Liver cancer 5. Marital History - Currently Married 6. Never A Smoker 7. Occupation:   Designer, industrial/product 8. Denied: Tobacco Use  Review of Systems Genitourinary, constitutional, skin, eye, otolaryngeal, hematologic/lymphatic, cardiovascular, pulmonary, endocrine, musculoskeletal, gastrointestinal, neurological and psychiatric system(s) were reviewed and pertinent findings if present are noted and are otherwise negative.  Genitourinary: urinary frequency, feelings of urinary urgency, nocturia and urinary stream starts and stops.  Constitutional: feeling tired (fatigue).    Vitals Vital Signs [Data Includes: Last 1 Day]  Recorded: [id number] 02:53PM  Blood Pressure: 121 / 83 Temperature: 98.4 F Heart Rate: 97  Physical Exam Constitutional: Well nourished and well developed . No acute distress.  ENT:. The ears and nose are normal in appearance.  Pulmonary: No respiratory distress and normal respiratory rhythm and effort.  Cardiovascular: Heart rate and rhythm are normal . No peripheral edema.  Abdomen: The abdomen is soft and nontender. No masses are palpated. No CVA tenderness. No hernias are palpable. No hepatosplenomegaly noted.  Genitourinary: Examination of the penis demonstrates no discharge, no masses, no lesions and a  normal meatus. The scrotum is without lesions. The right epididymis is palpably normal and non-tender. The left epididymis is palpably normal and non-tender. The right testis is non-tender and without masses. The left testis is non-tender and without masses.  Lymphatics: The femoral and inguinal nodes are not enlarged or tender.  Skin: Normal skin turgor, no visible rash and no visible skin lesions.  Neuro/Psych:. Mood and affect are appropriate.    Results/Data Urine [Data Includes: Last 1 Day]   [id number]  COLOR YELLOW   APPEARANCE CLOUDY   SPECIFIC GRAVITY 1.020   pH 6.5   GLUCOSE NEG mg/dL  BILIRUBIN NEG   KETONE NEG mg/dL  BLOOD LARGE   PROTEIN TRACE mg/dL  UROBILINOGEN 0.2 mg/dL  NITRITE NEG   LEUKOCYTE ESTERASE TRACE   SQUAMOUS EPITHELIAL/HPF RARE   WBC 21-50 WBC/hpf  RBC TNTC RBC/hpf  BACTERIA FEW   CRYSTALS NONE SEEN   CASTS NONE SEEN   Selected Results  AU CT-STONE PROTOCOL [id number] 12:00AM Carolan Clines   Test Name Result Flag Reference  CT-STONE PROTOCOL (Report)    ** RADIOLOGY REPORT BY [facility] **   CLINICAL DATA: Eighth Initial encounter for gross hematuria for 2 days duration about 3 weeks ago.  EXAM: CT ABDOMEN AND PELVIS WITHOUT CONTRAST (URINARY CALCULUS PROTOCOL)  TECHNIQUE: Multidetector CT imaging was performed through the abdomen and pelvis without intravenous contrast to include the urinary tract.  COMPARISON: 04/07/2011  FINDINGS: Lower chest: Scattered areas of subsegmental atelectasis seen in the lung bases bilaterally.  Hepatobiliary: No focal abnormality in the  liver on this study without intravenous contrast. No evidence for hepatomegaly. Small calcified stones noted in the lumen of the gallbladder without gallbladder wall thickening or pericholecystic fluid. No intrahepatic or extrahepatic biliary dilation.  Pancreas: Diffuse fatty change noted in the pancreas. No dilatation of the main pancreatic duct. No  focal pancreatic mass lesion is evident.  Spleen: No splenomegaly. No focal mass lesion.  Adrenals/Urinary Tract: No adrenal nodule or mass.  Six stones are seen in the right kidney ranging in size from 1-2 mm up to the largest stone which is in the interpolar region measuring 3 x 5 mm. This represents an interval increase in right renal stone burden. 6.4 cm cystic lesion in the medial right kidney has increased in size from 6.0 cm previously. 5.5 cm exophytic lesion from the lower pole the right kidney measures water density and has increased from 4.7 cm previously.  Two stones are seen in the left kidney. A 3 mm nonobstructing stone is seen in the upper pole and a 6 x 12 mm nonobstructing stone is seen in the lower pole. Both stones were present previously although the lower pole stone has increased in size in the interval. 6.4 cm water density lesion in the interpolar left kidney has increased in size from 5.7 cm previously. At  No evidence for ureteral stones on either side. No hydroureter. Bladder is decompressed without evidence for intraluminal stones.  Stomach/Bowel: Stomach is nondistended. No gastric wall thickening. No evidence of outlet obstruction. Duodenal diverticuli are evident. No small bowel wall thickening. No small bowel dilatation. Terminal ileum is normal. The appendix is not visualized, but there is no edema or inflammation in the region of the cecum. Diverticular changes are noted in the left colon without evidence of diverticulitis.  Vascular/Lymphatic: Atherosclerotic calcification is noted in the wall of the abdominal aorta without aneurysm. No lymphadenopathy in the abdomen. No pelvic sidewall lymphadenopathy.  Reproductive: Dystrophic calcification is seen in the central prostate gland. No substantial prostatomegaly by CT.  Other: No intraperitoneal free fluid.  Musculoskeletal: Small left inguinal hernia contains only fat. Bone windows reveal  no worrisome lytic or sclerotic osseous lesions.  IMPRESSION: Bilateral nonobstructing renal stones. No evidence for ureteral or bladder stones. No secondary changes in either kidney or ureter.  Bilateral low-density exophytic renal lesions compatible with cyst. These have increased slightly in size in the interval since the prior study.  Cholelithiasis.   Electronically Signed  By: Misty Stanley M.D.  On: 02/12/2014 16:35   Assessment Assessed  1. Gross hematuria (R31.0) 2. Chronic cystitis (N30.20) 3. Pyuria (N39.0) 4. Bilateral kidney stones (N20.0)  69 yo male with gross hematuria following induction of Xaralto for A. Fib, with associated L flank discomfort. U/a is abnormal, and will culture t see if there is a UTI.    We have reviewed his CT and note that he has bilateral renal calculi, with gross hematuria, and A. Fib on Xaralto. He has a large L renal cortical mid-pole cyst. I am concerned that lithotripsy would endanger rupturing the renal cyst. In addition, note that the BMI is > 30 ( 37), as pt weight 250 lbs. His stone is 58mm ( limit is 61mm); even though he has low HU of 353 ( limit is 900). I cannot measure his infundibulum width).    We have discussed the CT and I believe he would be best served with ureteroscopy and laser of renal pelvic stone. He will have c/s today and then placed on antibiotics.  Plan Gross hematuria  1. URINE CULTURE; Status:In Progress - Specimen/Data Collected;   Done:  [id number] Health Maintenance  2. UA With REFLEX; [Do Not Release]; Status:Resulted - Requires  Verification;   Done: [id number] 02:27PM  1. Urine culture : hold antibiotics until c/s returns  2. Schedule ureteroscopy and laser of L renal pelvic stone. Will probably need JJ stent post op. If all goes well, will plan for repeat on the Right side 4 months later. Will hold overnight because of his A. Fib on Xaralto. Pt to bring his C-Pap machine.   Discussion/Summary cc: Dr.  Maryella Shivers     Signatures Electronically signed by : Carolan Clines, M.D.; Feb 20 2014  3:46PM EST

## 2014-03-08 NOTE — Progress Notes (Signed)
Pt arrived from PACU on stretcher, slid self to bed. Oriented to callbell and environment. POC discussed. Pain 3/10 and tolerable. Voided red, bloody urine w/ small clot w/o difficulty. VSS. No c/o at present. Callbell in reach.

## 2014-03-08 NOTE — Progress Notes (Signed)
CBC and B Met drawn by lab.

## 2014-03-08 NOTE — Anesthesia Procedure Notes (Signed)
Procedure Name: LMA Insertion Date/Time: 03/08/2014 9:32 AM Performed by: Ofilia Neas Pre-anesthesia Checklist: Patient identified, Emergency Drugs available, Suction available, Patient being monitored and Timeout performed Patient Re-evaluated:Patient Re-evaluated prior to inductionOxygen Delivery Method: Circle system utilized Preoxygenation: Pre-oxygenation with 100% oxygen Intubation Type: IV induction LMA: LMA inserted LMA Size: 4.0 Number of attempts: 2 Placement Confirmation: positive ETCO2 Tube secured with: Tape Dental Injury: Teeth and Oropharynx as per pre-operative assessment  Comments: Read last airway note.  Tried no. 4 regular LMA this time.  Two attempts to seat.  Required shallow placement and more air to seal, seated better the more LMA was pulled back.

## 2014-03-08 NOTE — Progress Notes (Signed)
Warden Fillers, R.N.- 4 EAST- to look for CBC and B Met results

## 2014-03-08 NOTE — Op Note (Signed)
Pre-operative diagnosis :   Left lower pole stone with left flank pain  Postoperative diagnosis:  Same  Operation:  Cystourethroscopy, left retrograde pyelogram with interpretation, left ureteral dilation, left flexible digital ureteroscopy, with identification of left lower pole calyceal stone, with laser fragmentation of stone and basket extraction of stone; left double-J stent (6 [place] by 24 cm in (.  Surgeon:  S. Gaynelle Arabian, MD  First assistant:  None  Anesthesia:  General LMA  Preparation:  After appropriate preanesthesia, the patient was brought the operative room, placed on the operating table in the dorsal supine position where general LMA anesthesia was introduced. He was then replaced in the dorsal lithotomy position with the pubis was prepped with Betadine solution and draped in usual fashion. The arm and was double checked. The history was double checked. The left hand was previously marked with a marking pen.  Review history:    [name] returns today to review CT results & discuss ESWL. He was started on Xarelto on 01/23/14 for Atrial Fib. and the next day started having gross hematuria. He was also taking Aleve with Xarelto. Cardiologist advised to stop the Xarelto.    Hx of ED & hypogonadism. He is s/p cysto, urethral dilatation, and TURP 04/20/13. He states that he is able to urinate standing up now & procedure has been a great success.  Pathology: bengin prostatic tissue.     Hx of ED, hypogonadism, kidney stones, elevated PSA, urethral stricture & Vit D deficiency. Patient states that Dr. Linna Darner placed him on Cialis 2.5mg .   He was taking that along with [place], and was doing well. He states that his flow was slow until his TURP. He is s/p cystourethroscopy, urethral dilatation, R RPG, for urethral stricture and microscopic hematuria 06/12/11. In December 2013. .    10/14/11 Testosterone - 474.93    Statement of  Likelihood of Success: Excellent. TIME-OUT  observed.:  Procedure:  Cystourethroscopy was accomplished, showing that the patient had a split urethral meatus from previous meatotomy. The cystoscope was passed under direct vision, showing no evidence of urethral stricture disease. Prostate was previously resected. The bladder showed trabeculation but no cellule formation. There was no diverticular formation and no stone formation and no tumor from admission. Left retrograde pyelogram showed normal-appearing ureter, but somewhat tortuous around the prostate area so was identified a left lower pole, as well as left upper pole. 2 guidewires were passed into the kidney, both into the left upper pole.  One wire was secured as a safety wire, and one wire as a working wire. Over the working wire, a medium length ureteral dilating sheath was passed. Following ureteral dilation, and sheath placement, the [facility] digital flexible ureteroscope was passed into the kidney. The entire renal pelvis was scope, the patient was noted to have an intrarenal pelvis. All available calyces were identified and scoped. No other stones were identified. The lower pole was identified with some hyper angulation of the scope. The stone was identified in the right lower pole calyx, and using the 200  laser fiber, the stone was fragmented. The stone itself appeared to be 6.5 mm in diameter, and multifaceted. Following fibrillation stone, basket was passed, and fragments were removed. Repeat pyeloscopy was accomplished, but I could not get back into the lower pole calyx to ensure that all fragments were gone. Therefore, a double-J stent was elected to be passed, and a 6 [place] by 24 cm left double-J stent was passed without difficulty, coiled in the  renal pelvis, and coiled in the bladder. The bladder is drained of fluid. The patient tolerated the procedure well. He was awakened, and taken to recovery room in good condition. Covered with IV antibiotics, as well as IV Tylenol. His noted that  the patient has chronic atrial fibrillation, as noted by cardiology.

## 2014-03-08 NOTE — Anesthesia Preprocedure Evaluation (Signed)
Anesthesia Evaluation  Patient identified by MRN, date of birth, ID band Patient awake    Reviewed: Allergy & Precautions, H&P , NPO status , Patient's Chart, lab work & pertinent test results  Airway Mallampati: II  TM Distance: >3 FB Neck ROM: Full    Dental no notable dental hx.    Pulmonary sleep apnea and Continuous Positive Airway Pressure Ventilation ,  breath sounds clear to auscultation  Pulmonary exam normal       Cardiovascular hypertension, Pt. on medications and Pt. on home beta blockers + CAD Rhythm:Regular Rate:Normal     Neuro/Psych negative neurological ROS  negative psych ROS   GI/Hepatic Neg liver ROS, GERD-  Medicated,  Endo/Other  Morbid obesity  Renal/GU Renal disease  negative genitourinary   Musculoskeletal negative musculoskeletal ROS (+)   Abdominal (+) + obese,   Peds negative pediatric ROS (+)  Hematology negative hematology ROS (+)   Anesthesia Other Findings   Reproductive/Obstetrics negative OB ROS                             Anesthesia Physical  Anesthesia Plan  ASA: III  Anesthesia Plan: General   Post-op Pain Management:    Induction: Intravenous  Airway Management Planned: LMA  Additional Equipment:   Intra-op Plan:   Post-operative Plan: Extubation in OR  Informed Consent: I have reviewed the patients History and Physical, chart, labs and discussed the procedure including the risks, benefits and alternatives for the proposed anesthesia with the patient or authorized representative who has indicated his/her understanding and acceptance.   Dental advisory given  Plan Discussed with: CRNA  Anesthesia Plan Comments:         Anesthesia Quick Evaluation

## 2014-03-08 NOTE — Anesthesia Postprocedure Evaluation (Signed)
  Anesthesia Post-op Note  Patient: Spencer Cochran  Procedure(s) Performed: Procedure(s): CYSTOSCOPY WITH LEFT RETROGRADE PYELOGRAM/LEFT  URETEROSCOPY (Left) LASER OF LEFT RENAL PELVIC STONE (Left)  Patient Location: PACU  Anesthesia Type:General  Level of Consciousness: awake and alert   Airway and Oxygen Therapy: Patient Spontanous Breathing  Post-op Pain: none  Post-op Assessment: Post-op Vital signs reviewed  Post-op Vital Signs: Reviewed  Last Vitals:  Filed Vitals:   03/08/14 1215  BP: 113/71  Pulse: 89  Temp: 36.6 C  Resp: 17    Complications: No apparent anesthesia complications

## 2014-03-08 NOTE — Transfer of Care (Signed)
Immediate Anesthesia Transfer of Care Note  Patient: Calum Cormier  Procedure(s) Performed: Procedure(s): CYSTOSCOPY WITH LEFT RETROGRADE PYELOGRAM/LEFT  URETEROSCOPY (Left) LASER OF LEFT RENAL PELVIC STONE (Left)  Patient Location: PACU  Anesthesia Type:General  Level of Consciousness: awake, oriented, patient cooperative, lethargic and responds to stimulation  Airway & Oxygen Therapy: Patient Spontanous Breathing and Patient connected to face mask oxygen  Post-op Assessment: Report given to RN, Post -op Vital signs reviewed and stable and Patient moving all extremities  Post vital signs: Reviewed and stable  Last Vitals:  Filed Vitals:   03/08/14 0659  BP: 116/73  Pulse: 94  Temp: 36.7 C  Resp: 18    Complications: No apparent anesthesia complications

## 2014-03-08 NOTE — Interval H&P Note (Signed)
History and Physical Interval Note:  03/08/2014 9:04 AM  Spencer Cochran  has presented today for surgery, with the diagnosis of LEFT RENAL PELVIC STONE  The various methods of treatment have been discussed with the patient and family. After consideration of risks, benefits and other options for treatment, the patient has consented to  Procedure(s): CYSTOSCOPY WITH LEFT RETROGRADE PYELOGRAM/LEFT  URETEROSCOPY (Left) LASER OF LEFT RENAL PELVIC STONE (Left) as a surgical intervention .  The patient's history has been reviewed, patient examined, no change in status, stable for surgery.  I have reviewed the patient's chart and labs.  Questions were answered to the patient's satisfaction.     Carolan Clines I

## 2014-03-08 NOTE — Progress Notes (Signed)
Patient can go to Telemetry Bed now.

## 2014-03-09 ENCOUNTER — Encounter (HOSPITAL_COMMUNITY): Payer: Self-pay | Admitting: Urology

## 2014-03-09 DIAGNOSIS — N401 Enlarged prostate with lower urinary tract symptoms: Secondary | ICD-10-CM | POA: Diagnosis not present

## 2014-03-09 DIAGNOSIS — E291 Testicular hypofunction: Secondary | ICD-10-CM | POA: Diagnosis not present

## 2014-03-09 DIAGNOSIS — I482 Chronic atrial fibrillation: Secondary | ICD-10-CM | POA: Diagnosis not present

## 2014-03-09 DIAGNOSIS — N200 Calculus of kidney: Secondary | ICD-10-CM | POA: Diagnosis not present

## 2014-03-09 DIAGNOSIS — E559 Vitamin D deficiency, unspecified: Secondary | ICD-10-CM | POA: Diagnosis not present

## 2014-03-09 DIAGNOSIS — N529 Male erectile dysfunction, unspecified: Secondary | ICD-10-CM | POA: Diagnosis not present

## 2014-03-09 MED ORDER — PHENAZOPYRIDINE HCL 200 MG PO TABS: 200.0000 mg | ORAL_TABLET | Freq: Three times a day (TID) | ORAL | Status: DC | PRN

## 2014-03-09 MED ORDER — TRAMADOL-ACETAMINOPHEN 37.5-325 MG PO TABS
1.0000 | ORAL_TABLET | Freq: Four times a day (QID) | ORAL | Status: DC | PRN
Start: 2014-03-09 — End: 2016-10-01

## 2014-03-09 MED ORDER — TRIMETHOPRIM 100 MG PO TABS: 100.0000 mg | ORAL_TABLET | ORAL | Status: DC

## 2014-03-09 NOTE — Progress Notes (Signed)
1 Day Post-Op Subjective: Patient reports mild-mod. L abd and L flank discomfort. Voiding well. Urine still reddish color.   Objective: Vital signs in last 24 hours: Temp:  [97.6 F (36.4 C)-99.2 F (37.3 C)] 98.4 F (36.9 C) (02/05 0526) Pulse Rate:  [76-109] 87 (02/05 0526) Resp:  [11-25] 16 (02/05 0526) BP: (100-123)/(67-79) 117/78 mmHg (02/05 0904) SpO2:  [93 %-100 %] 95 % (02/05 0526)  Intake/Output from previous day: 02/04 0701 - 02/05 0700 In: 2935 [P.O.:960; I.V.:1875; IV Piggyback:100] Out: 2075 [Urine:2075] Intake/Output this shift: Total I/O In: 120 [P.O.:120] Out: 100 [Urine:100]  Physical Exam:  General:alert and cooperative GI: sift + BS. No organomegaly or masses.  GU: normal bladder/s-p area. Normal penis.   Lab Results:  Recent Labs  03/08/14 1152  HGB 14.4  HCT 43.9   BMET  Recent Labs  03/08/14 1152  NA 146*  K 3.9  CL 110  CO2 31  GLUCOSE 108*  BUN 20  CREATININE 0.90  CALCIUM 8.7   No results for input(s): LABPT, INR in the last 72 hours. No results for input(s): LABURIN in the last 72 hours. No results found for this or any previous visit.  Studies/Results:  Assessment/Plan:  POD #1 post L flex ureteroscopy and laser of lower pole stone. JJ stent in place. Minimal bleeding. Pt is opff Xaralto/ASA.  PLAN: D/c today            Restart Xaralto Sunday. Restart ASA Monday. Will d/c JJ at office visit.   LOS: 1 day   Ashantee Deupree I 03/09/2014, 10:39 AM

## 2014-03-09 NOTE — Progress Notes (Signed)
UR completed 

## 2014-03-09 NOTE — Discharge Summary (Signed)
Physician Discharge Summary  Patient ID: Spencer Cochran MRN: [id number] DOB/AGE: 08/17/1945 69 y.o.  Admit date: 03/08/2014 Discharge date: 03/09/2014  Admission Diagnoses: LEFT RENAL PELVIC STONE  Discharge Diagnoses:  Active Problems:   Atrial fibrillation   Discharged Condition: good  Hospital Course:  Surgery: L ureteroscopy and laser of lower pole stone  Significant Diagnostic Studies: No results found.  Discharge Exam: Blood pressure 117/78, pulse 87, temperature 98.4 F (36.9 C), temperature source Oral, resp. rate 16, height 5\' 9"  (1.753 m), weight 113.853 kg (251 lb), SpO2 95 %.   Disposition: 01-Home or Self Care  Discharge Instructions    Discharge patient    Complete by:  As directed      Discontinue IV    Complete by:  As directed             Medication List    TAKE these medications        amLODipine-benazepril 5-40 MG per capsule  Commonly known as:  LOTREL  Take 1 capsule by mouth every morning.     atorvastatin 40 MG tablet  Commonly known as:  LIPITOR  Take 40 mg by mouth at bedtime.     carvedilol 25 MG tablet  Commonly known as:  COREG  Take 25 mg by mouth 2 (two) times daily with a meal.     cholecalciferol 1000 UNITS tablet  Commonly known as:  VITAMIN D  Take 2,000 Units by mouth daily.     ferrous sulfate 325 (65 FE) MG EC tablet  Take 325 mg by mouth daily with breakfast.     fish oil-omega-3 fatty acids 1000 MG capsule  Take 1 g by mouth 2 (two) times daily.     fluticasone 50 MCG/ACT nasal spray  Commonly known as:  FLONASE  Place 1 spray into both nostrils daily as needed for allergies or rhinitis.     furosemide 40 MG tablet  Commonly known as:  LASIX  Take 40 mg by mouth every other day.     multivitamin with minerals tablet  Take 1 tablet by mouth daily.     nabumetone 500 MG tablet  Commonly known as:  RELAFEN  Take 500 mg by mouth 2 (two) times daily as needed for mild pain or moderate pain.     naproxen  sodium 220 MG tablet  Commonly known as:  ANAPROX  Take 660 mg by mouth 2 (two) times daily as needed (pain).     omeprazole 20 MG capsule  Commonly known as:  PRILOSEC  Take 20 mg by mouth every morning.     phenazopyridine 200 MG tablet  Commonly known as:  PYRIDIUM  Take 1 tablet (200 mg total) by mouth 3 (three) times daily as needed for pain.     rivaroxaban 20 MG Tabs tablet  Commonly known as:  XARELTO  Take 20 mg by mouth daily with supper.     testosterone 50 MG/5GM (1%) Gel  Commonly known as:  ANDROGEL  Place 5 g onto the skin daily.     Timolol Maleate 0.5 % (DAILY) Soln  Place 1 drop into both eyes 2 (two) times daily.     traMADol-acetaminophen 37.5-325 MG per tablet  Commonly known as:  ULTRACET  Take 1 tablet by mouth every 6 (six) hours as needed.     trimethoprim 100 MG tablet  Commonly known as:  TRIMPEX  Take 1 tablet (100 mg total) by mouth 1 day or 1 dose.  Follow-up Information    Follow up with Spencer Clines I, MD.   Specialty:  Urology   Why:  for Kidney x-ray and JJ removal   Contact information:   [place] [address] [phone]     1. OK to restart Spencer Cochran Sunday  Signed: Carolan Clines Cochran 03/09/2014, 10:47 AM

## 2014-03-09 NOTE — Progress Notes (Signed)
Patient discharged home with wife, discharge instructions given and explained to patient/wife and they verbalized understanding, patient denies any pain/distress, no wound noted, skin intact, no wound. Accompanied home by wife, transported to the car by staff via wheelchair.

## 2014-03-09 NOTE — Care Management Note (Signed)
    Page 1 of 1   03/09/2014     11:51:00 AM CARE MANAGEMENT NOTE 03/09/2014  Patient:  Spencer Cochran, Spencer Cochran   Account Number:  [id number]  Date Initiated:  03/09/2014  Documentation initiated by:  Dessa Phi  Subjective/Objective Assessment:   69 y/o m admitted w/l renal stone.     Action/Plan:   From home.   Anticipated DC Date:  03/09/2014   Anticipated DC Plan:  [place]  CM consult      Choice offered to / List presented to:             Status of service:  In process, will continue to follow Medicare Important Message given?   (If response is "NO", the following Medicare IM given date fields will be blank) Date Medicare IM given:   Medicare IM given by:   Date Additional Medicare IM given:   Additional Medicare IM given by:    Discharge Disposition:  HOME/SELF CARE  Per UR Regulation:  Reviewed for med. necessity/level of care/duration of stay  If discussed at [place] of Stay Meetings, dates discussed:    Comments:  03/09/14 Dessa Phi RN BSN NCM [phone] POD#1 cysto/stent.D/C home no needs or orders.

## 2014-03-09 NOTE — Discharge Instructions (Signed)
Kidney Stones Kidney stones (urolithiasis) are solid masses that form inside your kidneys. The intense pain is caused by the stone moving through the kidney, ureter, bladder, and urethra (urinary tract). When the stone moves, the ureter starts to spasm around the stone. The stone is usually passed in your pee (urine).  HOME CARE  Drink enough fluids to keep your pee clear or pale yellow. This helps to get the stone out.  Strain all pee through the provided strainer. Do not pee without peeing through the strainer, not even once. If you pee the stone out, catch it in the strainer. The stone may be as small as a grain of salt. Take this to your doctor. This will help your doctor figure out what you can do to try to prevent more kidney stones.  Only take medicine as told by your doctor.  Follow up with your doctor as told.  Get follow-up X-rays as told by your doctor. GET HELP IF: You have pain that gets worse even if you have been taking pain medicine. GET HELP RIGHT AWAY IF:   Your pain does not get better with medicine.  You have a fever or shaking chills.  Your pain increases and gets worse over 18 hours.  You have new belly (abdominal) pain.  You feel faint or pass out.  You are unable to pee. MAKE SURE YOU:   Understand these instructions.  Will watch your condition.  Will get help right away if you are not doing well or get worse. Document Released: 07/08/2007 Document Revised: 09/21/2012 Document Reviewed: 06/22/2012 [facility] Patient Information 2015 [place], [place]. This information is not intended to replace advice given to you by your health care provider. Make sure you discuss any questions you have with your health care provider.  Dietary Guidelines to Help Prevent Kidney Stones Your risk of kidney stones can be decreased by adjusting the foods you eat. The most important thing you can do is drink enough fluid. You should drink enough fluid to keep your urine clear or  pale yellow. The following guidelines provide specific information for the type of kidney stone you have had. GUIDELINES ACCORDING TO TYPE OF KIDNEY STONE Calcium Oxalate Kidney Stones  Reduce the amount of salt you eat. Foods that have a lot of salt cause your body to release excess calcium into your urine. The excess calcium can combine with a substance called oxalate to form kidney stones.  Reduce the amount of animal protein you eat if the amount you eat is excessive. Animal protein causes your body to release excess calcium into your urine. Ask your dietitian how much protein from animal sources you should be eating.  Avoid foods that are high in oxalates. If you take vitamins, they should have less than 500 mg of vitamin C. Your body turns vitamin C into oxalates. You do not need to avoid fruits and vegetables high in vitamin C. Calcium Phosphate Kidney Stones  Reduce the amount of salt you eat to help prevent the release of excess calcium into your urine.  Reduce the amount of animal protein you eat if the amount you eat is excessive. Animal protein causes your body to release excess calcium into your urine. Ask your dietitian how much protein from animal sources you should be eating.  Get enough calcium from food or take a calcium supplement (ask your dietitian for recommendations). Food sources of calcium that do not increase your risk of kidney stones include:  Broccoli.  Dairy products,  such as cheese and yogurt.  Pudding. Uric Acid Kidney Stones  Do not have more than 6 oz of animal protein per day. FOOD SOURCES Animal Protein Sources  Meat (all types).  Poultry.  Eggs.  Fish, seafood. Foods High in [employer] seasonings.  Soy sauce.  Teriyaki sauce.  Cured and processed meats.  Salted crackers and snack foods.  Fast food.  Canned soups and most canned foods. Foods High in Oxalates  Grains:  Amaranth.  Barley.  Grits.  Wheat  germ.  Bran.  Buckwheat flour.  All bran cereals.  Pretzels.  Whole wheat bread.  Vegetables:  Beans (wax).  Beets and beet greens.  Collard greens.  Eggplant.  Escarole.  Leeks.  Okra.  Parsley.  Rutabagas.  Spinach.  Swiss chard.  Tomato paste.  Fried potatoes.  Sweet potatoes.  Fruits:  Red currants.  Figs.  Kiwi.  Rhubarb.  Meat and Other Protein Sources:  Beans (dried).  Soy burgers and other soybean products.  Miso.  Nuts (peanuts, almonds, pecans, cashews, hazelnuts).  Nut butters.  Sesame seeds and tahini (paste made of sesame seeds).  Poppy seeds.  Beverages:  Chocolate drink mixes.  Soy milk.  Instant iced tea.  Juices made from high-oxalate fruits or vegetables.  Other:  Carob.  Chocolate.  Fruitcake.  Marmalades. Document Released: 05/16/2010 Document Revised: 01/24/2013 Document Reviewed: 12/16/2012 [facility] Patient Information 2015 [place], [place]. This information is not intended to replace advice given to you by your health care provider. Make sure you discuss any questions you have with your health care provider.

## 2014-03-12 DIAGNOSIS — I10 Essential (primary) hypertension: Secondary | ICD-10-CM | POA: Diagnosis not present

## 2014-03-12 DIAGNOSIS — H4011X2 Primary open-angle glaucoma, moderate stage: Secondary | ICD-10-CM | POA: Diagnosis not present

## 2014-03-13 DIAGNOSIS — I480 Paroxysmal atrial fibrillation: Secondary | ICD-10-CM | POA: Diagnosis not present

## 2014-03-13 DIAGNOSIS — I2510 Atherosclerotic heart disease of native coronary artery without angina pectoris: Secondary | ICD-10-CM | POA: Diagnosis not present

## 2014-03-13 DIAGNOSIS — R0602 Shortness of breath: Secondary | ICD-10-CM | POA: Diagnosis not present

## 2014-03-13 DIAGNOSIS — E785 Hyperlipidemia, unspecified: Secondary | ICD-10-CM | POA: Diagnosis not present

## 2014-03-13 DIAGNOSIS — I10 Essential (primary) hypertension: Secondary | ICD-10-CM | POA: Diagnosis not present

## 2014-03-21 DIAGNOSIS — N200 Calculus of kidney: Secondary | ICD-10-CM | POA: Diagnosis not present

## 2014-07-11 DIAGNOSIS — M79644 Pain in right finger(s): Secondary | ICD-10-CM | POA: Diagnosis not present

## 2014-07-11 DIAGNOSIS — M180 Bilateral primary osteoarthritis of first carpometacarpal joints: Secondary | ICD-10-CM | POA: Diagnosis not present

## 2014-07-11 DIAGNOSIS — M1812 Unilateral primary osteoarthritis of first carpometacarpal joint, left hand: Secondary | ICD-10-CM | POA: Diagnosis not present

## 2014-07-11 DIAGNOSIS — M79645 Pain in left finger(s): Secondary | ICD-10-CM | POA: Diagnosis not present

## 2014-07-11 DIAGNOSIS — M1811 Unilateral primary osteoarthritis of first carpometacarpal joint, right hand: Secondary | ICD-10-CM | POA: Diagnosis not present

## 2014-08-21 DIAGNOSIS — Z0000 Encounter for general adult medical examination without abnormal findings: Secondary | ICD-10-CM | POA: Diagnosis not present

## 2014-08-27 DIAGNOSIS — R972 Elevated prostate specific antigen [PSA]: Secondary | ICD-10-CM | POA: Diagnosis not present

## 2014-08-27 DIAGNOSIS — E291 Testicular hypofunction: Secondary | ICD-10-CM | POA: Diagnosis not present

## 2014-09-05 DIAGNOSIS — M25552 Pain in left hip: Secondary | ICD-10-CM | POA: Diagnosis not present

## 2014-09-05 DIAGNOSIS — M799 Soft tissue disorder, unspecified: Secondary | ICD-10-CM | POA: Diagnosis not present

## 2014-09-05 DIAGNOSIS — M545 Low back pain: Secondary | ICD-10-CM | POA: Diagnosis not present

## 2014-09-05 DIAGNOSIS — M47816 Spondylosis without myelopathy or radiculopathy, lumbar region: Secondary | ICD-10-CM | POA: Diagnosis not present

## 2014-09-05 DIAGNOSIS — M6281 Muscle weakness (generalized): Secondary | ICD-10-CM | POA: Diagnosis not present

## 2014-09-05 DIAGNOSIS — M4186 Other forms of scoliosis, lumbar region: Secondary | ICD-10-CM | POA: Diagnosis not present

## 2014-09-05 DIAGNOSIS — M47896 Other spondylosis, lumbar region: Secondary | ICD-10-CM | POA: Diagnosis not present

## 2014-09-05 DIAGNOSIS — Z6837 Body mass index (BMI) 37.0-37.9, adult: Secondary | ICD-10-CM | POA: Diagnosis not present

## 2014-09-05 DIAGNOSIS — M5134 Other intervertebral disc degeneration, thoracic region: Secondary | ICD-10-CM | POA: Diagnosis not present

## 2014-09-05 DIAGNOSIS — M4184 Other forms of scoliosis, thoracic region: Secondary | ICD-10-CM | POA: Diagnosis not present

## 2014-09-11 DIAGNOSIS — H47233 Glaucomatous optic atrophy, bilateral: Secondary | ICD-10-CM | POA: Diagnosis not present

## 2014-09-11 DIAGNOSIS — M25552 Pain in left hip: Secondary | ICD-10-CM | POA: Diagnosis not present

## 2014-09-11 DIAGNOSIS — H4011X2 Primary open-angle glaucoma, moderate stage: Secondary | ICD-10-CM | POA: Diagnosis not present

## 2014-09-11 DIAGNOSIS — M545 Low back pain: Secondary | ICD-10-CM | POA: Diagnosis not present

## 2014-09-11 DIAGNOSIS — H5201 Hypermetropia, right eye: Secondary | ICD-10-CM | POA: Diagnosis not present

## 2014-09-11 DIAGNOSIS — I10 Essential (primary) hypertension: Secondary | ICD-10-CM | POA: Diagnosis not present

## 2014-09-11 DIAGNOSIS — H25813 Combined forms of age-related cataract, bilateral: Secondary | ICD-10-CM | POA: Diagnosis not present

## 2014-09-11 DIAGNOSIS — M799 Soft tissue disorder, unspecified: Secondary | ICD-10-CM | POA: Diagnosis not present

## 2014-09-11 DIAGNOSIS — M6281 Muscle weakness (generalized): Secondary | ICD-10-CM | POA: Diagnosis not present

## 2014-09-11 DIAGNOSIS — H5213 Myopia, bilateral: Secondary | ICD-10-CM | POA: Diagnosis not present

## 2014-09-13 DIAGNOSIS — M25552 Pain in left hip: Secondary | ICD-10-CM | POA: Diagnosis not present

## 2014-09-13 DIAGNOSIS — M6281 Muscle weakness (generalized): Secondary | ICD-10-CM | POA: Diagnosis not present

## 2014-09-13 DIAGNOSIS — M799 Soft tissue disorder, unspecified: Secondary | ICD-10-CM | POA: Diagnosis not present

## 2014-09-13 DIAGNOSIS — M545 Low back pain: Secondary | ICD-10-CM | POA: Diagnosis not present

## 2014-09-17 DIAGNOSIS — M545 Low back pain: Secondary | ICD-10-CM | POA: Diagnosis not present

## 2014-09-17 DIAGNOSIS — M799 Soft tissue disorder, unspecified: Secondary | ICD-10-CM | POA: Diagnosis not present

## 2014-09-17 DIAGNOSIS — M6281 Muscle weakness (generalized): Secondary | ICD-10-CM | POA: Diagnosis not present

## 2014-09-17 DIAGNOSIS — M25552 Pain in left hip: Secondary | ICD-10-CM | POA: Diagnosis not present

## 2014-09-18 DIAGNOSIS — M6281 Muscle weakness (generalized): Secondary | ICD-10-CM | POA: Diagnosis not present

## 2014-09-18 DIAGNOSIS — M545 Low back pain: Secondary | ICD-10-CM | POA: Diagnosis not present

## 2014-09-18 DIAGNOSIS — M799 Soft tissue disorder, unspecified: Secondary | ICD-10-CM | POA: Diagnosis not present

## 2014-09-18 DIAGNOSIS — M25552 Pain in left hip: Secondary | ICD-10-CM | POA: Diagnosis not present

## 2014-09-25 DIAGNOSIS — M799 Soft tissue disorder, unspecified: Secondary | ICD-10-CM | POA: Diagnosis not present

## 2014-09-25 DIAGNOSIS — M545 Low back pain: Secondary | ICD-10-CM | POA: Diagnosis not present

## 2014-09-25 DIAGNOSIS — Z6837 Body mass index (BMI) 37.0-37.9, adult: Secondary | ICD-10-CM | POA: Diagnosis not present

## 2014-09-25 DIAGNOSIS — M6281 Muscle weakness (generalized): Secondary | ICD-10-CM | POA: Diagnosis not present

## 2014-09-25 DIAGNOSIS — M25552 Pain in left hip: Secondary | ICD-10-CM | POA: Diagnosis not present

## 2014-09-25 DIAGNOSIS — M25511 Pain in right shoulder: Secondary | ICD-10-CM | POA: Diagnosis not present

## 2014-09-27 DIAGNOSIS — M799 Soft tissue disorder, unspecified: Secondary | ICD-10-CM | POA: Diagnosis not present

## 2014-09-27 DIAGNOSIS — M25552 Pain in left hip: Secondary | ICD-10-CM | POA: Diagnosis not present

## 2014-09-27 DIAGNOSIS — M545 Low back pain: Secondary | ICD-10-CM | POA: Diagnosis not present

## 2014-09-27 DIAGNOSIS — M6281 Muscle weakness (generalized): Secondary | ICD-10-CM | POA: Diagnosis not present

## 2014-10-01 DIAGNOSIS — M799 Soft tissue disorder, unspecified: Secondary | ICD-10-CM | POA: Diagnosis not present

## 2014-10-01 DIAGNOSIS — M25552 Pain in left hip: Secondary | ICD-10-CM | POA: Diagnosis not present

## 2014-10-01 DIAGNOSIS — M6281 Muscle weakness (generalized): Secondary | ICD-10-CM | POA: Diagnosis not present

## 2014-10-01 DIAGNOSIS — M545 Low back pain: Secondary | ICD-10-CM | POA: Diagnosis not present

## 2014-10-02 DIAGNOSIS — E291 Testicular hypofunction: Secondary | ICD-10-CM | POA: Diagnosis not present

## 2014-10-02 DIAGNOSIS — N5201 Erectile dysfunction due to arterial insufficiency: Secondary | ICD-10-CM | POA: Diagnosis not present

## 2014-10-02 DIAGNOSIS — N200 Calculus of kidney: Secondary | ICD-10-CM | POA: Diagnosis not present

## 2014-10-02 DIAGNOSIS — N3000 Acute cystitis without hematuria: Secondary | ICD-10-CM | POA: Diagnosis not present

## 2014-10-03 DIAGNOSIS — M25552 Pain in left hip: Secondary | ICD-10-CM | POA: Diagnosis not present

## 2014-10-03 DIAGNOSIS — M545 Low back pain: Secondary | ICD-10-CM | POA: Diagnosis not present

## 2014-10-03 DIAGNOSIS — M6281 Muscle weakness (generalized): Secondary | ICD-10-CM | POA: Diagnosis not present

## 2014-10-03 DIAGNOSIS — M799 Soft tissue disorder, unspecified: Secondary | ICD-10-CM | POA: Diagnosis not present

## 2014-10-04 DIAGNOSIS — M25552 Pain in left hip: Secondary | ICD-10-CM | POA: Diagnosis not present

## 2014-10-04 DIAGNOSIS — M545 Low back pain: Secondary | ICD-10-CM | POA: Diagnosis not present

## 2014-10-04 DIAGNOSIS — M6281 Muscle weakness (generalized): Secondary | ICD-10-CM | POA: Diagnosis not present

## 2014-10-04 DIAGNOSIS — M799 Soft tissue disorder, unspecified: Secondary | ICD-10-CM | POA: Diagnosis not present

## 2014-10-09 DIAGNOSIS — M25552 Pain in left hip: Secondary | ICD-10-CM | POA: Diagnosis not present

## 2014-10-09 DIAGNOSIS — I4891 Unspecified atrial fibrillation: Secondary | ICD-10-CM | POA: Diagnosis not present

## 2014-10-09 DIAGNOSIS — M6281 Muscle weakness (generalized): Secondary | ICD-10-CM | POA: Diagnosis not present

## 2014-10-09 DIAGNOSIS — I2510 Atherosclerotic heart disease of native coronary artery without angina pectoris: Secondary | ICD-10-CM | POA: Insufficient documentation

## 2014-10-09 DIAGNOSIS — M799 Soft tissue disorder, unspecified: Secondary | ICD-10-CM | POA: Diagnosis not present

## 2014-10-09 DIAGNOSIS — M545 Low back pain: Secondary | ICD-10-CM | POA: Diagnosis not present

## 2014-10-09 DIAGNOSIS — E785 Hyperlipidemia, unspecified: Secondary | ICD-10-CM | POA: Diagnosis not present

## 2014-10-09 DIAGNOSIS — I480 Paroxysmal atrial fibrillation: Secondary | ICD-10-CM | POA: Diagnosis not present

## 2014-10-09 DIAGNOSIS — I10 Essential (primary) hypertension: Secondary | ICD-10-CM | POA: Diagnosis not present

## 2014-10-09 HISTORY — DX: Atherosclerotic heart disease of native coronary artery without angina pectoris: I25.10

## 2014-10-10 DIAGNOSIS — M545 Low back pain: Secondary | ICD-10-CM | POA: Diagnosis not present

## 2014-10-10 DIAGNOSIS — M25552 Pain in left hip: Secondary | ICD-10-CM | POA: Diagnosis not present

## 2014-10-10 DIAGNOSIS — M799 Soft tissue disorder, unspecified: Secondary | ICD-10-CM | POA: Diagnosis not present

## 2014-10-10 DIAGNOSIS — M6281 Muscle weakness (generalized): Secondary | ICD-10-CM | POA: Diagnosis not present

## 2014-10-11 DIAGNOSIS — M799 Soft tissue disorder, unspecified: Secondary | ICD-10-CM | POA: Diagnosis not present

## 2014-10-11 DIAGNOSIS — M545 Low back pain: Secondary | ICD-10-CM | POA: Diagnosis not present

## 2014-10-11 DIAGNOSIS — M25552 Pain in left hip: Secondary | ICD-10-CM | POA: Diagnosis not present

## 2014-10-11 DIAGNOSIS — M6281 Muscle weakness (generalized): Secondary | ICD-10-CM | POA: Diagnosis not present

## 2014-10-23 DIAGNOSIS — M799 Soft tissue disorder, unspecified: Secondary | ICD-10-CM | POA: Diagnosis not present

## 2014-10-23 DIAGNOSIS — M25552 Pain in left hip: Secondary | ICD-10-CM | POA: Diagnosis not present

## 2014-10-23 DIAGNOSIS — M6281 Muscle weakness (generalized): Secondary | ICD-10-CM | POA: Diagnosis not present

## 2014-10-23 DIAGNOSIS — M545 Low back pain: Secondary | ICD-10-CM | POA: Diagnosis not present

## 2014-10-24 DIAGNOSIS — N281 Cyst of kidney, acquired: Secondary | ICD-10-CM | POA: Diagnosis not present

## 2014-10-24 DIAGNOSIS — R310 Gross hematuria: Secondary | ICD-10-CM | POA: Diagnosis not present

## 2014-10-24 DIAGNOSIS — N200 Calculus of kidney: Secondary | ICD-10-CM | POA: Diagnosis not present

## 2014-10-25 DIAGNOSIS — M6281 Muscle weakness (generalized): Secondary | ICD-10-CM | POA: Diagnosis not present

## 2014-10-25 DIAGNOSIS — M545 Low back pain: Secondary | ICD-10-CM | POA: Diagnosis not present

## 2014-10-25 DIAGNOSIS — M799 Soft tissue disorder, unspecified: Secondary | ICD-10-CM | POA: Diagnosis not present

## 2014-10-25 DIAGNOSIS — M25552 Pain in left hip: Secondary | ICD-10-CM | POA: Diagnosis not present

## 2014-10-30 DIAGNOSIS — M6281 Muscle weakness (generalized): Secondary | ICD-10-CM | POA: Diagnosis not present

## 2014-10-30 DIAGNOSIS — M799 Soft tissue disorder, unspecified: Secondary | ICD-10-CM | POA: Diagnosis not present

## 2014-10-30 DIAGNOSIS — M25552 Pain in left hip: Secondary | ICD-10-CM | POA: Diagnosis not present

## 2014-10-30 DIAGNOSIS — M545 Low back pain: Secondary | ICD-10-CM | POA: Diagnosis not present

## 2014-11-01 DIAGNOSIS — M25552 Pain in left hip: Secondary | ICD-10-CM | POA: Diagnosis not present

## 2014-11-01 DIAGNOSIS — M545 Low back pain: Secondary | ICD-10-CM | POA: Diagnosis not present

## 2014-11-01 DIAGNOSIS — M6281 Muscle weakness (generalized): Secondary | ICD-10-CM | POA: Diagnosis not present

## 2014-11-01 DIAGNOSIS — M799 Soft tissue disorder, unspecified: Secondary | ICD-10-CM | POA: Diagnosis not present

## 2014-11-06 DIAGNOSIS — M545 Low back pain: Secondary | ICD-10-CM | POA: Diagnosis not present

## 2014-11-06 DIAGNOSIS — M25552 Pain in left hip: Secondary | ICD-10-CM | POA: Diagnosis not present

## 2014-11-06 DIAGNOSIS — M799 Soft tissue disorder, unspecified: Secondary | ICD-10-CM | POA: Diagnosis not present

## 2014-11-06 DIAGNOSIS — Z23 Encounter for immunization: Secondary | ICD-10-CM | POA: Diagnosis not present

## 2014-11-06 DIAGNOSIS — Z0000 Encounter for general adult medical examination without abnormal findings: Secondary | ICD-10-CM | POA: Diagnosis not present

## 2014-11-06 DIAGNOSIS — M6281 Muscle weakness (generalized): Secondary | ICD-10-CM | POA: Diagnosis not present

## 2014-11-08 DIAGNOSIS — M545 Low back pain: Secondary | ICD-10-CM | POA: Diagnosis not present

## 2014-11-08 DIAGNOSIS — M25552 Pain in left hip: Secondary | ICD-10-CM | POA: Diagnosis not present

## 2014-11-08 DIAGNOSIS — M799 Soft tissue disorder, unspecified: Secondary | ICD-10-CM | POA: Diagnosis not present

## 2014-11-08 DIAGNOSIS — M6281 Muscle weakness (generalized): Secondary | ICD-10-CM | POA: Diagnosis not present

## 2014-11-12 DIAGNOSIS — I480 Paroxysmal atrial fibrillation: Secondary | ICD-10-CM | POA: Diagnosis not present

## 2014-11-12 DIAGNOSIS — I10 Essential (primary) hypertension: Secondary | ICD-10-CM | POA: Diagnosis not present

## 2014-11-12 DIAGNOSIS — E785 Hyperlipidemia, unspecified: Secondary | ICD-10-CM | POA: Diagnosis not present

## 2014-11-12 DIAGNOSIS — I2510 Atherosclerotic heart disease of native coronary artery without angina pectoris: Secondary | ICD-10-CM | POA: Diagnosis not present

## 2014-11-13 DIAGNOSIS — M25552 Pain in left hip: Secondary | ICD-10-CM | POA: Diagnosis not present

## 2014-11-13 DIAGNOSIS — M799 Soft tissue disorder, unspecified: Secondary | ICD-10-CM | POA: Diagnosis not present

## 2014-11-13 DIAGNOSIS — M6281 Muscle weakness (generalized): Secondary | ICD-10-CM | POA: Diagnosis not present

## 2014-11-13 DIAGNOSIS — M545 Low back pain: Secondary | ICD-10-CM | POA: Diagnosis not present

## 2014-11-15 DIAGNOSIS — M545 Low back pain: Secondary | ICD-10-CM | POA: Diagnosis not present

## 2014-11-15 DIAGNOSIS — M6281 Muscle weakness (generalized): Secondary | ICD-10-CM | POA: Diagnosis not present

## 2014-11-15 DIAGNOSIS — M25552 Pain in left hip: Secondary | ICD-10-CM | POA: Diagnosis not present

## 2014-11-15 DIAGNOSIS — M799 Soft tissue disorder, unspecified: Secondary | ICD-10-CM | POA: Diagnosis not present

## 2014-11-20 DIAGNOSIS — M799 Soft tissue disorder, unspecified: Secondary | ICD-10-CM | POA: Diagnosis not present

## 2014-11-20 DIAGNOSIS — M545 Low back pain: Secondary | ICD-10-CM | POA: Diagnosis not present

## 2014-11-20 DIAGNOSIS — M6281 Muscle weakness (generalized): Secondary | ICD-10-CM | POA: Diagnosis not present

## 2014-11-20 DIAGNOSIS — M25552 Pain in left hip: Secondary | ICD-10-CM | POA: Diagnosis not present

## 2014-11-22 DIAGNOSIS — M799 Soft tissue disorder, unspecified: Secondary | ICD-10-CM | POA: Diagnosis not present

## 2014-11-22 DIAGNOSIS — M545 Low back pain: Secondary | ICD-10-CM | POA: Diagnosis not present

## 2014-11-22 DIAGNOSIS — M6281 Muscle weakness (generalized): Secondary | ICD-10-CM | POA: Diagnosis not present

## 2014-11-22 DIAGNOSIS — M25552 Pain in left hip: Secondary | ICD-10-CM | POA: Diagnosis not present

## 2014-11-27 DIAGNOSIS — M25552 Pain in left hip: Secondary | ICD-10-CM | POA: Diagnosis not present

## 2014-11-27 DIAGNOSIS — M799 Soft tissue disorder, unspecified: Secondary | ICD-10-CM | POA: Diagnosis not present

## 2014-11-27 DIAGNOSIS — M545 Low back pain: Secondary | ICD-10-CM | POA: Diagnosis not present

## 2014-11-27 DIAGNOSIS — M6281 Muscle weakness (generalized): Secondary | ICD-10-CM | POA: Diagnosis not present

## 2014-11-29 DIAGNOSIS — M799 Soft tissue disorder, unspecified: Secondary | ICD-10-CM | POA: Diagnosis not present

## 2014-11-29 DIAGNOSIS — M545 Low back pain: Secondary | ICD-10-CM | POA: Diagnosis not present

## 2014-11-29 DIAGNOSIS — M6281 Muscle weakness (generalized): Secondary | ICD-10-CM | POA: Diagnosis not present

## 2014-11-29 DIAGNOSIS — M25552 Pain in left hip: Secondary | ICD-10-CM | POA: Diagnosis not present

## 2014-12-03 DIAGNOSIS — M545 Low back pain: Secondary | ICD-10-CM | POA: Diagnosis not present

## 2014-12-03 DIAGNOSIS — M25552 Pain in left hip: Secondary | ICD-10-CM | POA: Diagnosis not present

## 2014-12-03 DIAGNOSIS — M799 Soft tissue disorder, unspecified: Secondary | ICD-10-CM | POA: Diagnosis not present

## 2014-12-03 DIAGNOSIS — M6281 Muscle weakness (generalized): Secondary | ICD-10-CM | POA: Diagnosis not present

## 2014-12-05 DIAGNOSIS — M799 Soft tissue disorder, unspecified: Secondary | ICD-10-CM | POA: Diagnosis not present

## 2014-12-05 DIAGNOSIS — M25552 Pain in left hip: Secondary | ICD-10-CM | POA: Diagnosis not present

## 2014-12-05 DIAGNOSIS — M545 Low back pain: Secondary | ICD-10-CM | POA: Diagnosis not present

## 2014-12-05 DIAGNOSIS — M6281 Muscle weakness (generalized): Secondary | ICD-10-CM | POA: Diagnosis not present

## 2015-01-04 DIAGNOSIS — R310 Gross hematuria: Secondary | ICD-10-CM | POA: Diagnosis not present

## 2015-01-13 DIAGNOSIS — N200 Calculus of kidney: Secondary | ICD-10-CM | POA: Diagnosis not present

## 2015-01-15 DIAGNOSIS — I480 Paroxysmal atrial fibrillation: Secondary | ICD-10-CM | POA: Diagnosis not present

## 2015-01-15 DIAGNOSIS — I10 Essential (primary) hypertension: Secondary | ICD-10-CM | POA: Diagnosis not present

## 2015-01-15 DIAGNOSIS — R319 Hematuria, unspecified: Secondary | ICD-10-CM | POA: Diagnosis not present

## 2015-02-20 DIAGNOSIS — Z0000 Encounter for general adult medical examination without abnormal findings: Secondary | ICD-10-CM | POA: Diagnosis not present

## 2015-02-20 DIAGNOSIS — R310 Gross hematuria: Secondary | ICD-10-CM | POA: Diagnosis not present

## 2015-02-25 DIAGNOSIS — N200 Calculus of kidney: Secondary | ICD-10-CM | POA: Diagnosis not present

## 2015-02-25 DIAGNOSIS — R310 Gross hematuria: Secondary | ICD-10-CM | POA: Diagnosis not present

## 2015-03-25 DIAGNOSIS — Z6837 Body mass index (BMI) 37.0-37.9, adult: Secondary | ICD-10-CM | POA: Diagnosis not present

## 2015-03-25 DIAGNOSIS — K649 Unspecified hemorrhoids: Secondary | ICD-10-CM | POA: Diagnosis not present

## 2015-03-25 DIAGNOSIS — G4733 Obstructive sleep apnea (adult) (pediatric): Secondary | ICD-10-CM | POA: Diagnosis not present

## 2015-03-25 DIAGNOSIS — I2510 Atherosclerotic heart disease of native coronary artery without angina pectoris: Secondary | ICD-10-CM | POA: Diagnosis not present

## 2015-03-27 DIAGNOSIS — N281 Cyst of kidney, acquired: Secondary | ICD-10-CM | POA: Diagnosis not present

## 2015-03-27 DIAGNOSIS — Z0000 Encounter for general adult medical examination without abnormal findings: Secondary | ICD-10-CM | POA: Diagnosis not present

## 2015-03-27 DIAGNOSIS — R310 Gross hematuria: Secondary | ICD-10-CM | POA: Diagnosis not present

## 2015-04-16 DIAGNOSIS — I2510 Atherosclerotic heart disease of native coronary artery without angina pectoris: Secondary | ICD-10-CM | POA: Diagnosis not present

## 2015-04-16 DIAGNOSIS — Z7189 Other specified counseling: Secondary | ICD-10-CM | POA: Diagnosis not present

## 2015-04-16 DIAGNOSIS — Z6837 Body mass index (BMI) 37.0-37.9, adult: Secondary | ICD-10-CM | POA: Diagnosis not present

## 2015-05-23 DIAGNOSIS — N200 Calculus of kidney: Secondary | ICD-10-CM | POA: Diagnosis not present

## 2015-05-23 DIAGNOSIS — I10 Essential (primary) hypertension: Secondary | ICD-10-CM | POA: Diagnosis not present

## 2015-05-23 DIAGNOSIS — E785 Hyperlipidemia, unspecified: Secondary | ICD-10-CM | POA: Diagnosis not present

## 2015-05-23 DIAGNOSIS — I480 Paroxysmal atrial fibrillation: Secondary | ICD-10-CM | POA: Diagnosis not present

## 2015-05-23 DIAGNOSIS — I2510 Atherosclerotic heart disease of native coronary artery without angina pectoris: Secondary | ICD-10-CM | POA: Diagnosis not present

## 2015-05-24 DIAGNOSIS — Z6837 Body mass index (BMI) 37.0-37.9, adult: Secondary | ICD-10-CM | POA: Diagnosis not present

## 2015-05-24 DIAGNOSIS — G4733 Obstructive sleep apnea (adult) (pediatric): Secondary | ICD-10-CM | POA: Diagnosis not present

## 2015-05-24 DIAGNOSIS — I2510 Atherosclerotic heart disease of native coronary artery without angina pectoris: Secondary | ICD-10-CM | POA: Diagnosis not present

## 2015-06-10 DIAGNOSIS — Z6838 Body mass index (BMI) 38.0-38.9, adult: Secondary | ICD-10-CM | POA: Diagnosis not present

## 2015-06-10 DIAGNOSIS — L259 Unspecified contact dermatitis, unspecified cause: Secondary | ICD-10-CM | POA: Diagnosis not present

## 2015-07-04 DIAGNOSIS — Z6838 Body mass index (BMI) 38.0-38.9, adult: Secondary | ICD-10-CM | POA: Diagnosis not present

## 2015-07-04 DIAGNOSIS — I2510 Atherosclerotic heart disease of native coronary artery without angina pectoris: Secondary | ICD-10-CM | POA: Diagnosis not present

## 2015-07-04 DIAGNOSIS — G4733 Obstructive sleep apnea (adult) (pediatric): Secondary | ICD-10-CM | POA: Diagnosis not present

## 2015-08-08 DIAGNOSIS — G4733 Obstructive sleep apnea (adult) (pediatric): Secondary | ICD-10-CM | POA: Diagnosis not present

## 2015-08-08 DIAGNOSIS — I2510 Atherosclerotic heart disease of native coronary artery without angina pectoris: Secondary | ICD-10-CM | POA: Diagnosis not present

## 2015-08-08 DIAGNOSIS — Z6837 Body mass index (BMI) 37.0-37.9, adult: Secondary | ICD-10-CM | POA: Diagnosis not present

## 2015-09-11 DIAGNOSIS — Z6836 Body mass index (BMI) 36.0-36.9, adult: Secondary | ICD-10-CM | POA: Diagnosis not present

## 2015-09-11 DIAGNOSIS — L819 Disorder of pigmentation, unspecified: Secondary | ICD-10-CM | POA: Diagnosis not present

## 2015-09-12 DIAGNOSIS — H43813 Vitreous degeneration, bilateral: Secondary | ICD-10-CM | POA: Diagnosis not present

## 2015-09-12 DIAGNOSIS — H43393 Other vitreous opacities, bilateral: Secondary | ICD-10-CM | POA: Diagnosis not present

## 2015-09-12 DIAGNOSIS — H401132 Primary open-angle glaucoma, bilateral, moderate stage: Secondary | ICD-10-CM | POA: Diagnosis not present

## 2015-09-12 DIAGNOSIS — H524 Presbyopia: Secondary | ICD-10-CM | POA: Diagnosis not present

## 2015-09-12 DIAGNOSIS — I10 Essential (primary) hypertension: Secondary | ICD-10-CM | POA: Diagnosis not present

## 2015-09-12 DIAGNOSIS — H25813 Combined forms of age-related cataract, bilateral: Secondary | ICD-10-CM | POA: Diagnosis not present

## 2015-09-12 DIAGNOSIS — H52223 Regular astigmatism, bilateral: Secondary | ICD-10-CM | POA: Diagnosis not present

## 2015-09-12 DIAGNOSIS — H5213 Myopia, bilateral: Secondary | ICD-10-CM | POA: Diagnosis not present

## 2015-09-12 DIAGNOSIS — H47233 Glaucomatous optic atrophy, bilateral: Secondary | ICD-10-CM | POA: Diagnosis not present

## 2015-09-16 DIAGNOSIS — L820 Inflamed seborrheic keratosis: Secondary | ICD-10-CM | POA: Diagnosis not present

## 2015-09-16 DIAGNOSIS — L280 Lichen simplex chronicus: Secondary | ICD-10-CM | POA: Diagnosis not present

## 2015-09-25 DIAGNOSIS — N401 Enlarged prostate with lower urinary tract symptoms: Secondary | ICD-10-CM | POA: Diagnosis not present

## 2015-09-25 DIAGNOSIS — R310 Gross hematuria: Secondary | ICD-10-CM | POA: Diagnosis not present

## 2015-09-30 DIAGNOSIS — Z6837 Body mass index (BMI) 37.0-37.9, adult: Secondary | ICD-10-CM | POA: Diagnosis not present

## 2015-10-21 DIAGNOSIS — Z6837 Body mass index (BMI) 37.0-37.9, adult: Secondary | ICD-10-CM | POA: Diagnosis not present

## 2015-10-21 DIAGNOSIS — R319 Hematuria, unspecified: Secondary | ICD-10-CM | POA: Diagnosis not present

## 2015-10-21 DIAGNOSIS — R8271 Bacteriuria: Secondary | ICD-10-CM | POA: Diagnosis not present

## 2015-10-21 DIAGNOSIS — Z23 Encounter for immunization: Secondary | ICD-10-CM | POA: Diagnosis not present

## 2015-10-21 DIAGNOSIS — M545 Low back pain: Secondary | ICD-10-CM | POA: Diagnosis not present

## 2015-10-29 DIAGNOSIS — M545 Low back pain: Secondary | ICD-10-CM | POA: Diagnosis not present

## 2015-10-29 DIAGNOSIS — M4806 Spinal stenosis, lumbar region: Secondary | ICD-10-CM | POA: Diagnosis not present

## 2015-11-06 DIAGNOSIS — E782 Mixed hyperlipidemia: Secondary | ICD-10-CM | POA: Diagnosis not present

## 2015-11-06 DIAGNOSIS — I482 Chronic atrial fibrillation: Secondary | ICD-10-CM | POA: Diagnosis not present

## 2015-11-06 DIAGNOSIS — G4730 Sleep apnea, unspecified: Secondary | ICD-10-CM | POA: Diagnosis not present

## 2015-11-06 DIAGNOSIS — I10 Essential (primary) hypertension: Secondary | ICD-10-CM | POA: Diagnosis not present

## 2015-11-06 DIAGNOSIS — E669 Obesity, unspecified: Secondary | ICD-10-CM | POA: Diagnosis not present

## 2015-11-18 DIAGNOSIS — D0472 Carcinoma in situ of skin of left lower limb, including hip: Secondary | ICD-10-CM | POA: Diagnosis not present

## 2015-11-25 DIAGNOSIS — I2510 Atherosclerotic heart disease of native coronary artery without angina pectoris: Secondary | ICD-10-CM | POA: Diagnosis not present

## 2015-11-25 DIAGNOSIS — I480 Paroxysmal atrial fibrillation: Secondary | ICD-10-CM | POA: Diagnosis not present

## 2015-11-25 DIAGNOSIS — I10 Essential (primary) hypertension: Secondary | ICD-10-CM | POA: Diagnosis not present

## 2015-11-25 DIAGNOSIS — E785 Hyperlipidemia, unspecified: Secondary | ICD-10-CM | POA: Diagnosis not present

## 2015-11-27 DIAGNOSIS — I10 Essential (primary) hypertension: Secondary | ICD-10-CM | POA: Diagnosis not present

## 2015-11-27 DIAGNOSIS — I480 Paroxysmal atrial fibrillation: Secondary | ICD-10-CM | POA: Diagnosis not present

## 2015-11-27 DIAGNOSIS — I2510 Atherosclerotic heart disease of native coronary artery without angina pectoris: Secondary | ICD-10-CM | POA: Diagnosis not present

## 2015-11-27 DIAGNOSIS — E785 Hyperlipidemia, unspecified: Secondary | ICD-10-CM | POA: Diagnosis not present

## 2015-11-28 DIAGNOSIS — M79645 Pain in left finger(s): Secondary | ICD-10-CM | POA: Diagnosis not present

## 2015-11-28 DIAGNOSIS — S6992XA Unspecified injury of left wrist, hand and finger(s), initial encounter: Secondary | ICD-10-CM | POA: Diagnosis not present

## 2015-11-28 DIAGNOSIS — R079 Chest pain, unspecified: Secondary | ICD-10-CM | POA: Diagnosis not present

## 2015-11-28 DIAGNOSIS — S20219A Contusion of unspecified front wall of thorax, initial encounter: Secondary | ICD-10-CM | POA: Diagnosis not present

## 2015-12-04 DIAGNOSIS — C44319 Basal cell carcinoma of skin of other parts of face: Secondary | ICD-10-CM | POA: Diagnosis not present

## 2015-12-06 DIAGNOSIS — E785 Hyperlipidemia, unspecified: Secondary | ICD-10-CM | POA: Diagnosis not present

## 2015-12-06 DIAGNOSIS — Z7901 Long term (current) use of anticoagulants: Secondary | ICD-10-CM | POA: Diagnosis not present

## 2015-12-06 DIAGNOSIS — I480 Paroxysmal atrial fibrillation: Secondary | ICD-10-CM | POA: Diagnosis not present

## 2015-12-06 DIAGNOSIS — I10 Essential (primary) hypertension: Secondary | ICD-10-CM | POA: Diagnosis not present

## 2015-12-06 DIAGNOSIS — I2510 Atherosclerotic heart disease of native coronary artery without angina pectoris: Secondary | ICD-10-CM | POA: Diagnosis not present

## 2015-12-18 DIAGNOSIS — H401132 Primary open-angle glaucoma, bilateral, moderate stage: Secondary | ICD-10-CM | POA: Diagnosis not present

## 2015-12-18 DIAGNOSIS — I10 Essential (primary) hypertension: Secondary | ICD-10-CM | POA: Diagnosis not present

## 2015-12-18 DIAGNOSIS — H47233 Glaucomatous optic atrophy, bilateral: Secondary | ICD-10-CM | POA: Diagnosis not present

## 2016-01-15 DIAGNOSIS — C44319 Basal cell carcinoma of skin of other parts of face: Secondary | ICD-10-CM | POA: Diagnosis not present

## 2016-02-07 DIAGNOSIS — M4696 Unspecified inflammatory spondylopathy, lumbar region: Secondary | ICD-10-CM | POA: Diagnosis not present

## 2016-02-07 DIAGNOSIS — M48061 Spinal stenosis, lumbar region without neurogenic claudication: Secondary | ICD-10-CM | POA: Diagnosis not present

## 2016-02-07 DIAGNOSIS — Z6835 Body mass index (BMI) 35.0-35.9, adult: Secondary | ICD-10-CM | POA: Diagnosis not present

## 2016-02-07 DIAGNOSIS — M5126 Other intervertebral disc displacement, lumbar region: Secondary | ICD-10-CM | POA: Diagnosis not present

## 2016-02-18 DIAGNOSIS — M5126 Other intervertebral disc displacement, lumbar region: Secondary | ICD-10-CM | POA: Diagnosis not present

## 2016-02-18 DIAGNOSIS — Z0000 Encounter for general adult medical examination without abnormal findings: Secondary | ICD-10-CM | POA: Diagnosis not present

## 2016-02-18 DIAGNOSIS — N390 Urinary tract infection, site not specified: Secondary | ICD-10-CM | POA: Diagnosis not present

## 2016-02-18 DIAGNOSIS — M4696 Unspecified inflammatory spondylopathy, lumbar region: Secondary | ICD-10-CM | POA: Diagnosis not present

## 2016-02-18 DIAGNOSIS — R109 Unspecified abdominal pain: Secondary | ICD-10-CM | POA: Diagnosis not present

## 2016-03-23 DIAGNOSIS — M4696 Unspecified inflammatory spondylopathy, lumbar region: Secondary | ICD-10-CM | POA: Diagnosis not present

## 2016-03-23 DIAGNOSIS — Z6836 Body mass index (BMI) 36.0-36.9, adult: Secondary | ICD-10-CM | POA: Diagnosis not present

## 2016-05-03 DIAGNOSIS — S8991XA Unspecified injury of right lower leg, initial encounter: Secondary | ICD-10-CM | POA: Diagnosis not present

## 2016-05-03 DIAGNOSIS — M25561 Pain in right knee: Secondary | ICD-10-CM | POA: Diagnosis not present

## 2016-05-04 DIAGNOSIS — M25561 Pain in right knee: Secondary | ICD-10-CM | POA: Diagnosis not present

## 2016-05-18 DIAGNOSIS — M25561 Pain in right knee: Secondary | ICD-10-CM | POA: Diagnosis not present

## 2016-05-21 DIAGNOSIS — N281 Cyst of kidney, acquired: Secondary | ICD-10-CM | POA: Diagnosis not present

## 2016-05-21 DIAGNOSIS — G4733 Obstructive sleep apnea (adult) (pediatric): Secondary | ICD-10-CM | POA: Diagnosis not present

## 2016-05-21 DIAGNOSIS — R7301 Impaired fasting glucose: Secondary | ICD-10-CM | POA: Diagnosis not present

## 2016-05-21 DIAGNOSIS — I2510 Atherosclerotic heart disease of native coronary artery without angina pectoris: Secondary | ICD-10-CM | POA: Diagnosis not present

## 2016-06-16 DIAGNOSIS — H47233 Glaucomatous optic atrophy, bilateral: Secondary | ICD-10-CM | POA: Diagnosis not present

## 2016-06-16 DIAGNOSIS — H401132 Primary open-angle glaucoma, bilateral, moderate stage: Secondary | ICD-10-CM | POA: Diagnosis not present

## 2016-06-16 DIAGNOSIS — I10 Essential (primary) hypertension: Secondary | ICD-10-CM | POA: Diagnosis not present

## 2016-06-17 DIAGNOSIS — M25561 Pain in right knee: Secondary | ICD-10-CM | POA: Diagnosis not present

## 2016-06-18 DIAGNOSIS — Z789 Other specified health status: Secondary | ICD-10-CM | POA: Diagnosis not present

## 2016-06-19 DIAGNOSIS — I480 Paroxysmal atrial fibrillation: Secondary | ICD-10-CM | POA: Diagnosis not present

## 2016-06-19 DIAGNOSIS — I10 Essential (primary) hypertension: Secondary | ICD-10-CM | POA: Diagnosis not present

## 2016-06-19 DIAGNOSIS — I2510 Atherosclerotic heart disease of native coronary artery without angina pectoris: Secondary | ICD-10-CM | POA: Diagnosis not present

## 2016-06-19 DIAGNOSIS — E785 Hyperlipidemia, unspecified: Secondary | ICD-10-CM | POA: Diagnosis not present

## 2016-06-22 DIAGNOSIS — S83241A Other tear of medial meniscus, current injury, right knee, initial encounter: Secondary | ICD-10-CM | POA: Diagnosis not present

## 2016-06-22 DIAGNOSIS — X509XXA Other and unspecified overexertion or strenuous movements or postures, initial encounter: Secondary | ICD-10-CM | POA: Diagnosis not present

## 2016-06-22 DIAGNOSIS — M25561 Pain in right knee: Secondary | ICD-10-CM | POA: Diagnosis not present

## 2016-06-22 DIAGNOSIS — M1711 Unilateral primary osteoarthritis, right knee: Secondary | ICD-10-CM | POA: Diagnosis not present

## 2016-06-24 DIAGNOSIS — S83241A Other tear of medial meniscus, current injury, right knee, initial encounter: Secondary | ICD-10-CM | POA: Diagnosis not present

## 2016-06-24 DIAGNOSIS — M84369D Stress fracture, unspecified tibia and fibula, subsequent encounter for fracture with routine healing: Secondary | ICD-10-CM | POA: Diagnosis not present

## 2016-06-24 DIAGNOSIS — M810 Age-related osteoporosis without current pathological fracture: Secondary | ICD-10-CM | POA: Diagnosis not present

## 2016-06-24 DIAGNOSIS — M25561 Pain in right knee: Secondary | ICD-10-CM | POA: Diagnosis not present

## 2016-07-02 DIAGNOSIS — M47817 Spondylosis without myelopathy or radiculopathy, lumbosacral region: Secondary | ICD-10-CM | POA: Diagnosis not present

## 2016-07-02 DIAGNOSIS — M4693 Unspecified inflammatory spondylopathy, cervicothoracic region: Secondary | ICD-10-CM | POA: Diagnosis not present

## 2016-07-06 DIAGNOSIS — M85852 Other specified disorders of bone density and structure, left thigh: Secondary | ICD-10-CM | POA: Diagnosis not present

## 2016-07-06 DIAGNOSIS — M810 Age-related osteoporosis without current pathological fracture: Secondary | ICD-10-CM | POA: Diagnosis not present

## 2016-07-08 DIAGNOSIS — E785 Hyperlipidemia, unspecified: Secondary | ICD-10-CM | POA: Diagnosis not present

## 2016-07-08 DIAGNOSIS — I10 Essential (primary) hypertension: Secondary | ICD-10-CM | POA: Diagnosis not present

## 2016-07-08 DIAGNOSIS — I25118 Atherosclerotic heart disease of native coronary artery with other forms of angina pectoris: Secondary | ICD-10-CM | POA: Diagnosis not present

## 2016-07-08 DIAGNOSIS — I480 Paroxysmal atrial fibrillation: Secondary | ICD-10-CM | POA: Diagnosis not present

## 2016-07-16 DIAGNOSIS — M84369D Stress fracture, unspecified tibia and fibula, subsequent encounter for fracture with routine healing: Secondary | ICD-10-CM | POA: Diagnosis not present

## 2016-07-16 DIAGNOSIS — S83241A Other tear of medial meniscus, current injury, right knee, initial encounter: Secondary | ICD-10-CM | POA: Diagnosis not present

## 2016-07-16 DIAGNOSIS — M25561 Pain in right knee: Secondary | ICD-10-CM | POA: Diagnosis not present

## 2016-07-21 DIAGNOSIS — Z79899 Other long term (current) drug therapy: Secondary | ICD-10-CM | POA: Diagnosis not present

## 2016-07-21 DIAGNOSIS — R079 Chest pain, unspecified: Secondary | ICD-10-CM | POA: Diagnosis not present

## 2016-07-21 DIAGNOSIS — Z7901 Long term (current) use of anticoagulants: Secondary | ICD-10-CM | POA: Diagnosis not present

## 2016-07-21 DIAGNOSIS — I482 Chronic atrial fibrillation: Secondary | ICD-10-CM | POA: Diagnosis not present

## 2016-07-21 DIAGNOSIS — R0789 Other chest pain: Secondary | ICD-10-CM | POA: Diagnosis not present

## 2016-07-21 DIAGNOSIS — I472 Ventricular tachycardia: Secondary | ICD-10-CM | POA: Diagnosis not present

## 2016-07-21 DIAGNOSIS — Z79891 Long term (current) use of opiate analgesic: Secondary | ICD-10-CM | POA: Diagnosis not present

## 2016-07-21 DIAGNOSIS — K219 Gastro-esophageal reflux disease without esophagitis: Secondary | ICD-10-CM | POA: Diagnosis not present

## 2016-07-21 DIAGNOSIS — I10 Essential (primary) hypertension: Secondary | ICD-10-CM | POA: Diagnosis not present

## 2016-07-21 LAB — PROTIME-INR

## 2016-07-22 DIAGNOSIS — Z7901 Long term (current) use of anticoagulants: Secondary | ICD-10-CM | POA: Diagnosis not present

## 2016-07-22 DIAGNOSIS — R079 Chest pain, unspecified: Secondary | ICD-10-CM | POA: Diagnosis not present

## 2016-07-22 DIAGNOSIS — R0789 Other chest pain: Secondary | ICD-10-CM | POA: Diagnosis not present

## 2016-07-22 DIAGNOSIS — K219 Gastro-esophageal reflux disease without esophagitis: Secondary | ICD-10-CM | POA: Diagnosis not present

## 2016-07-22 DIAGNOSIS — I472 Ventricular tachycardia: Secondary | ICD-10-CM | POA: Diagnosis not present

## 2016-07-22 DIAGNOSIS — I482 Chronic atrial fibrillation: Secondary | ICD-10-CM | POA: Diagnosis not present

## 2016-07-24 DIAGNOSIS — R0789 Other chest pain: Secondary | ICD-10-CM | POA: Diagnosis not present

## 2016-07-24 DIAGNOSIS — K210 Gastro-esophageal reflux disease with esophagitis: Secondary | ICD-10-CM | POA: Diagnosis not present

## 2016-07-24 DIAGNOSIS — Z139 Encounter for screening, unspecified: Secondary | ICD-10-CM | POA: Diagnosis not present

## 2016-07-24 DIAGNOSIS — Z1389 Encounter for screening for other disorder: Secondary | ICD-10-CM | POA: Diagnosis not present

## 2016-07-24 DIAGNOSIS — E663 Overweight: Secondary | ICD-10-CM | POA: Diagnosis not present

## 2016-07-24 DIAGNOSIS — I4891 Unspecified atrial fibrillation: Secondary | ICD-10-CM | POA: Diagnosis not present

## 2016-07-28 DIAGNOSIS — I480 Paroxysmal atrial fibrillation: Secondary | ICD-10-CM | POA: Diagnosis not present

## 2016-08-11 DIAGNOSIS — K5900 Constipation, unspecified: Secondary | ICD-10-CM | POA: Diagnosis not present

## 2016-08-24 ENCOUNTER — Other Ambulatory Visit: Payer: Self-pay

## 2016-08-26 DIAGNOSIS — G4733 Obstructive sleep apnea (adult) (pediatric): Secondary | ICD-10-CM | POA: Diagnosis not present

## 2016-08-26 DIAGNOSIS — I4891 Unspecified atrial fibrillation: Secondary | ICD-10-CM | POA: Diagnosis not present

## 2016-08-26 DIAGNOSIS — R002 Palpitations: Secondary | ICD-10-CM | POA: Diagnosis not present

## 2016-08-26 DIAGNOSIS — Z6836 Body mass index (BMI) 36.0-36.9, adult: Secondary | ICD-10-CM | POA: Diagnosis not present

## 2016-09-10 DIAGNOSIS — L821 Other seborrheic keratosis: Secondary | ICD-10-CM | POA: Diagnosis not present

## 2016-09-10 DIAGNOSIS — L570 Actinic keratosis: Secondary | ICD-10-CM | POA: Diagnosis not present

## 2016-09-10 DIAGNOSIS — L578 Other skin changes due to chronic exposure to nonionizing radiation: Secondary | ICD-10-CM | POA: Diagnosis not present

## 2016-09-14 DIAGNOSIS — H52221 Regular astigmatism, right eye: Secondary | ICD-10-CM | POA: Diagnosis not present

## 2016-09-14 DIAGNOSIS — H25813 Combined forms of age-related cataract, bilateral: Secondary | ICD-10-CM | POA: Diagnosis not present

## 2016-09-14 DIAGNOSIS — I10 Essential (primary) hypertension: Secondary | ICD-10-CM | POA: Diagnosis not present

## 2016-09-14 DIAGNOSIS — H47233 Glaucomatous optic atrophy, bilateral: Secondary | ICD-10-CM | POA: Diagnosis not present

## 2016-09-14 DIAGNOSIS — H5213 Myopia, bilateral: Secondary | ICD-10-CM | POA: Diagnosis not present

## 2016-09-14 DIAGNOSIS — H524 Presbyopia: Secondary | ICD-10-CM | POA: Diagnosis not present

## 2016-09-14 DIAGNOSIS — H401132 Primary open-angle glaucoma, bilateral, moderate stage: Secondary | ICD-10-CM | POA: Diagnosis not present

## 2016-09-28 DIAGNOSIS — S83241A Other tear of medial meniscus, current injury, right knee, initial encounter: Secondary | ICD-10-CM | POA: Diagnosis not present

## 2016-09-28 DIAGNOSIS — M25561 Pain in right knee: Secondary | ICD-10-CM | POA: Diagnosis not present

## 2016-09-28 DIAGNOSIS — M1711 Unilateral primary osteoarthritis, right knee: Secondary | ICD-10-CM | POA: Diagnosis not present

## 2016-10-01 ENCOUNTER — Encounter: Payer: Self-pay | Admitting: Cardiology

## 2016-10-01 ENCOUNTER — Ambulatory Visit (INDEPENDENT_AMBULATORY_CARE_PROVIDER_SITE_OTHER): Payer: Medicare Other | Admitting: Cardiology

## 2016-10-01 VITALS — BP 126/74 | HR 88 | Resp 12 | Ht 69.0 in | Wt 248.0 lb

## 2016-10-01 DIAGNOSIS — I2510 Atherosclerotic heart disease of native coronary artery without angina pectoris: Secondary | ICD-10-CM

## 2016-10-01 DIAGNOSIS — I482 Chronic atrial fibrillation, unspecified: Secondary | ICD-10-CM

## 2016-10-01 DIAGNOSIS — E785 Hyperlipidemia, unspecified: Secondary | ICD-10-CM | POA: Diagnosis not present

## 2016-10-01 HISTORY — DX: Hyperlipidemia, unspecified: E78.5

## 2016-10-01 NOTE — Patient Instructions (Signed)
Medication Instructions:  Your physician recommends that you continue on your current medications as directed. Please refer to the Current Medication list given to you today.  Labwork: None   Testing/Procedures: EKG in office today.   Follow-Up: Your physician recommends that you schedule a follow-up appointment in: 3 months  Any Other Special Instructions Will Be Listed Below (If Applicable).  Please note that any paperwork needing to be filled out by the provider will need to be addressed at the front desk prior to seeing the provider. Please note that any paperwork FMLA, Disability or other documents regarding health condition is subject to a $25.00 charge that must be received prior to completion of paperwork in the form of a money order or check.    If you need a refill on your cardiac medications before your next appointment, please call your pharmacy.

## 2016-10-01 NOTE — Progress Notes (Signed)
Cardiology Office Note:    Date:  10/01/2016   ID:  Spencer Cochran, DOB 10-Jan-1946, MRN [id number]  PCP:  Maryella Shivers, MD  Cardiologist:  Jenne Campus, MD    Referring MD: Maryella Shivers, MD   Chief Complaint  Patient presents with  . Hospitalization Follow-up  . Follow up on Holter Monitor  I'm feeling good  History of Present Illness:    Spencer Cochran is a 71 y.o. male  with atrial fibrillation which appears to be chronic now. He was recently in the hospital because of some atypical chest pain/epigastric, discomfort he rule out for microinfarction, stress test was done which showed no evidence of ischemia. I will try to retrieve echocardiogram from that hospitalization. Event recorder showed multiple episodes of atrial fibrillation and now he appears to be chronic atrial fibrillation. Because some discussion about what to do with this situation he is feeling fine overall therefore we'll continue anticoagulation on present medications. He reports to me that he had some hematuria he stopped Xarelto for 2 days immature subside at that he started taking Eliquis 5 mg twice a day no hematuria reported now. But I still told him that he to see his urologist.  Past Medical History:  Diagnosis Date  . Arthritis knees and ankle  . Asymptomatic gallstones   . Cataract immature BILATERAL EYES  . Coronary artery disease CARDIOLOGIST-  DR Agustin Cree  Tia Alert)---  LAST VISIT 04-29-2011   DENIES CARDIAC SYMPTOMS  . DDD (degenerative disc disease), lumbar   . Dysrhythmia    A Fib   . Frequency of urination   . GERD (gastroesophageal reflux disease)   . Glaucoma   . Heart murmur   . Hypertension   . 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 Left ventricular diastolic dysfunction PER ECHO 03-31-2011  W/ CHART  . Nocturia   . OSA on CPAP   . Renal stones bilateral   no symptoms at this time  . Urethral stricture     Past Surgical History:    Procedure Laterality Date  . APPENDECTOMY  1972  . CARDIOVASCULAR STRESS TEST  04-29-2010   DR KRASAWSKI   NO EVIDENCE ISCHEMIA/ NORMAL LVSF AND WALL MOTION/ EF 63%  . CERVICAL FUSION  2006   C3 - 6  . colonscopy     . CYSTO/ URETHRAL DILATION/ TRANSURETHRAL INCISIONOF PROSTATE  01-13-2007  . CYSTOSCOPY  2005  . CYSTOSCOPY W/ RETROGRADES  06/12/2011   Procedure: CYSTOSCOPY WITH RETROGRADE PYELOGRAM;  Surgeon: Ailene Rud, MD;  Location: [facility];  Service: Urology;  Laterality: N/A;  cysto, urethral dilation, right retrograde pyelogram    . CYSTOSCOPY WITH RETROGRADE PYELOGRAM, URETEROSCOPY AND STENT PLACEMENT Left 03/08/2014   Procedure: CYSTOSCOPY WITH LEFT RETROGRADE PYELOGRAM/LEFT  URETEROSCOPY;  Surgeon: Ailene Rud, MD;  Location: [facility];  Service: Urology;  Laterality: Left;  . CYSTOSCOPY WITH URETHRAL DILATATION N/A 04/20/2013   Procedure: CYSTOSCOPY WITH URETHRAL DILATATION;  Surgeon: Ailene Rud, MD;  Location: [facility];  Service: Urology;  Laterality: N/A;  . EXTRACORPOREAL SHOCK WAVE LITHOTRIPSY  01-17-2007   LEFT  . HOLMIUM LASER APPLICATION Left 8/0/9983   Procedure: LASER OF LEFT RENAL PELVIC STONE;  Surgeon: Ailene Rud, MD;  Location: [facility];  Service: Urology;  Laterality: Left;  . INGUINAL HERNIA REPAIR  2003  . LEFT ACHILLES TENDON REPAIR  1992  . NASAL SINUS SURGERY  2012  . PENILE SURGERY  OF  MEATUS  1955  . TRANSTHORACIC ECHOCARDIOGRAM  03-31-2011   NORMAL LVEF  59%/ TRIVIAL MR/ DIASTOLIC DYSFUNCTION/ MODERATE LVH  . TRANSURETHRAL RESECTION OF PROSTATE N/A 04/20/2013   Procedure: TRANSURETHRAL RESECTION OF THE PROSTATE WITH GYRUS INSTRUMENTS;  Surgeon: Ailene Rud, MD;  Location: [facility];  Service: Urology;  Laterality: N/A;    Current Medications: Current Meds  Medication Sig  . amLODipine-benazepril (LOTREL) 5-40 MG per capsule Take 1 capsule by mouth every morning.  Marland Kitchen atorvastatin (LIPITOR) 40 MG  tablet Take 40 mg by mouth at bedtime.   . carvedilol (COREG) 25 MG tablet Take 25 mg by mouth 2 (two) times daily with a meal.  . cholecalciferol (VITAMIN D) 1000 UNITS tablet Take 2,000 Units by mouth daily.  . ferrous sulfate 325 (65 FE) MG EC tablet Take 325 mg by mouth daily with breakfast.  . fish oil-omega-3 fatty acids 1000 MG capsule Take 1 g by mouth daily.   . fluticasone (FLONASE) 50 MCG/ACT nasal spray Place 1 spray into both nostrils daily as needed for allergies or rhinitis.  . furosemide (LASIX) 40 MG tablet Take 40 mg by mouth every other day.  . nabumetone (RELAFEN) 500 MG tablet Take 500 mg by mouth 2 (two) times daily as needed for mild pain or moderate pain.  Marland Kitchen omeprazole (PRILOSEC) 20 MG capsule Take 20 mg by mouth every morning.     Allergies:   Hydrocodone   Social History   Social History  . Marital status: Married    Spouse name: N/A  . Number of children: N/A  . Years of education: N/A   Social History Main Topics  . Smoking status: Never Smoker  . Smokeless tobacco: Never Used  . Alcohol use No  . Drug use: No  . Sexual activity: Yes   Other Topics Concern  . None   Social History Narrative  . None     Family History: The patient's family history includes Breast cancer in his mother; Cirrhosis in his father. ROS:   Please see the history of present illness.    All 14 point review of systems negative except as described per history of present illness  EKGs/Labs/Other Studies Reviewed:      Recent Labs: No results found for requested labs within last 8760 hours.  Recent Lipid Panel No results found for: CHOL, TRIG, HDL, CHOLHDL, VLDL, LDLCALC, LDLDIRECT  Physical Exam:    VS:  BP 126/74   Pulse 88   Resp 12   Ht 5\' 9"  (1.753 m)   Wt 248 lb (112.5 kg)   BMI 36.62 kg/m     Wt Readings from Last 3 Encounters:  10/01/16 248 lb (112.5 kg)  03/08/14 251 lb (113.9 kg)  03/02/14 251 lb 8 oz (114.1 kg)     GEN:  Well nourished, well  developed in no acute distress HEENT: Normal NECK: No JVD; No carotid bruits LYMPHATICS: No lymphadenopathy CARDIAC: RRR, no murmurs, no rubs, no gallops RESPIRATORY:  Clear to auscultation without rales, wheezing or rhonchi  ABDOMEN: Soft, non-tender, non-distended MUSCULOSKELETAL:  No edema; No deformity  SKIN: Warm and dry LOWER EXTREMITIES: no swelling NEUROLOGIC:  Alert and oriented x 3 PSYCHIATRIC:  Normal affect   ASSESSMENT:    1. Chronic atrial fibrillation ([place])   2. Coronary artery disease involving native coronary artery of native heart without angina pectoris   3. Dyslipidemia    PLAN:    In order of problems listed above:  1. Chronic atrial  fibrillation: Anticoagulated which I will continue, will get EKG today and based on that'll decide if we need to augment his AV blocking agent. 2. Disease asymptomatic recent stress test negative. 3. Dyslipidemia: We'll call primary care physician to get fasting lipid profile.   Medication Adjustments/Labs and Tests Ordered: Current medicines are reviewed at length with the patient today.  Concerns regarding medicines are outlined above.  No orders of the defined types were placed in this encounter.  Medication changes: No orders of the defined types were placed in this encounter.   Signed, Park Liter, MD, [facility] 10/01/2016 11:17 AM    [place]

## 2016-10-02 DIAGNOSIS — R310 Gross hematuria: Secondary | ICD-10-CM | POA: Diagnosis not present

## 2016-10-12 DIAGNOSIS — R310 Gross hematuria: Secondary | ICD-10-CM | POA: Diagnosis not present

## 2016-10-12 DIAGNOSIS — N200 Calculus of kidney: Secondary | ICD-10-CM | POA: Diagnosis not present

## 2016-10-13 DIAGNOSIS — E559 Vitamin D deficiency, unspecified: Secondary | ICD-10-CM | POA: Diagnosis not present

## 2016-10-13 DIAGNOSIS — E782 Mixed hyperlipidemia: Secondary | ICD-10-CM | POA: Diagnosis not present

## 2016-10-13 DIAGNOSIS — Z13220 Encounter for screening for lipoid disorders: Secondary | ICD-10-CM | POA: Diagnosis not present

## 2016-10-13 DIAGNOSIS — R972 Elevated prostate specific antigen [PSA]: Secondary | ICD-10-CM | POA: Diagnosis not present

## 2016-10-15 DIAGNOSIS — M1711 Unilateral primary osteoarthritis, right knee: Secondary | ICD-10-CM | POA: Diagnosis not present

## 2016-10-19 DIAGNOSIS — S8002XA Contusion of left knee, initial encounter: Secondary | ICD-10-CM | POA: Diagnosis not present

## 2016-10-19 DIAGNOSIS — S8992XA Unspecified injury of left lower leg, initial encounter: Secondary | ICD-10-CM | POA: Diagnosis not present

## 2016-10-21 DIAGNOSIS — I10 Essential (primary) hypertension: Secondary | ICD-10-CM | POA: Diagnosis not present

## 2016-10-21 DIAGNOSIS — Z139 Encounter for screening, unspecified: Secondary | ICD-10-CM | POA: Diagnosis not present

## 2016-10-21 DIAGNOSIS — Z23 Encounter for immunization: Secondary | ICD-10-CM | POA: Diagnosis not present

## 2016-10-21 DIAGNOSIS — I4891 Unspecified atrial fibrillation: Secondary | ICD-10-CM | POA: Diagnosis not present

## 2016-10-21 DIAGNOSIS — S83512A Sprain of anterior cruciate ligament of left knee, initial encounter: Secondary | ICD-10-CM | POA: Diagnosis not present

## 2016-10-21 DIAGNOSIS — M25562 Pain in left knee: Secondary | ICD-10-CM | POA: Diagnosis not present

## 2016-10-21 DIAGNOSIS — Z01818 Encounter for other preprocedural examination: Secondary | ICD-10-CM | POA: Diagnosis not present

## 2016-10-21 DIAGNOSIS — M1712 Unilateral primary osteoarthritis, left knee: Secondary | ICD-10-CM | POA: Diagnosis not present

## 2016-10-21 DIAGNOSIS — I2510 Atherosclerotic heart disease of native coronary artery without angina pectoris: Secondary | ICD-10-CM | POA: Diagnosis not present

## 2016-10-25 ENCOUNTER — Other Ambulatory Visit: Payer: Self-pay | Admitting: Cardiology

## 2016-10-26 DIAGNOSIS — M25562 Pain in left knee: Secondary | ICD-10-CM | POA: Diagnosis not present

## 2016-10-27 ENCOUNTER — Ambulatory Visit (INDEPENDENT_AMBULATORY_CARE_PROVIDER_SITE_OTHER): Payer: Medicare Other | Admitting: Cardiology

## 2016-10-27 ENCOUNTER — Encounter: Payer: Self-pay | Admitting: Cardiology

## 2016-10-27 VITALS — BP 116/60 | HR 80 | Resp 12 | Ht 69.0 in | Wt 243.8 lb

## 2016-10-27 DIAGNOSIS — I482 Chronic atrial fibrillation, unspecified: Secondary | ICD-10-CM

## 2016-10-27 DIAGNOSIS — E785 Hyperlipidemia, unspecified: Secondary | ICD-10-CM | POA: Diagnosis not present

## 2016-10-27 DIAGNOSIS — I2510 Atherosclerotic heart disease of native coronary artery without angina pectoris: Secondary | ICD-10-CM | POA: Diagnosis not present

## 2016-10-27 NOTE — Patient Instructions (Signed)
Medication Instructions:  Your physician recommends that you continue on your current medications as directed. Please refer to the Current Medication list given to you today.  Labwork: None   Testing/Procedures: None  Follow-Up: Your physician recommends that you schedule a follow-up appointment in: 2 months   Any Other Special Instructions Will Be Listed Below (If Applicable).  Please note that any paperwork needing to be filled out by the provider will need to be addressed at the front desk prior to seeing the provider. Please note that any paperwork FMLA, Disability or other documents regarding health condition is subject to a $25.00 charge that must be received prior to completion of paperwork in the form of a money order or check.    If you need a refill on your cardiac medications before your next appointment, please call your pharmacy.

## 2016-10-27 NOTE — Progress Notes (Signed)
Cardiology Office Note:    Date:  10/27/2016   ID:  Spencer Cochran, DOB August 22, 1945, MRN [id number]  PCP:  Maryella Shivers, MD  Cardiologist:  Jenne Campus, MD    Referring MD: Maryella Shivers, MD   Chief Complaint  Patient presents with  . Pre-op Exam  re-op evaluation for knee surgery  History of Present Illness:    Spencer Cochran is a 71 y.o. male  He is doing well cares having any chest pain tightness squeezing pr burning chest. No palpitations no dizziness.He sustained a knee injury and he required orthopedic intervention. My understanding is that it will be done under general anesesia or spinal anesthesia. Overall knee surgery is considered relatively low risk procedure from cardiac standpoint of view. On top of that just recently he got quite extensive cardiac evaluation which included stress testing all were normal. Therefore from cardiac standpoint of view is related to proceed with surgery as scheduled. He is anticoagulated and his anticoagulation need to be stopped is 24 hours before surgery. He did be restarted as quickly as visible from surgical point of view. All his medications need to be continued around surgical time  Past Medical History:  Diagnosis Date  . Arthritis knees and ankle  . Asymptomatic gallstones   . Cataract immature BILATERAL EYES  . Coronary artery disease CARDIOLOGIST-  DR Agustin Cree  [facility])---  LAST VISIT 04-29-2011   DENIES CARDIAC SYMPTOMS  . DDD (degenerative disc disease), lumbar   . Dysrhythmia    A Fib   . Frequency of urination   . GERD (gastroesophageal reflux disease)   . Glaucoma   . Heart murmur   . Hypertension   . 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 Left ventricular diastolic dysfunction PER ECHO 03-31-2011  W/ CHART  . Nocturia   . OSA on CPAP   . Renal stones bilateral   no symptoms at this time  . Urethral stricture     Past Surgical History:  Procedure Laterality Date  .  APPENDECTOMY  1972  . CARDIOVASCULAR STRESS TEST  04-29-2010   DR KRASAWSKI   NO EVIDENCE ISCHEMIA/ NORMAL LVSF AND WALL MOTION/ EF 63%  . CERVICAL FUSION  2006   C3 - 6  . colonscopy     . CYSTO/ URETHRAL DILATION/ TRANSURETHRAL INCISIONOF PROSTATE  01-13-2007  . CYSTOSCOPY  2005  . CYSTOSCOPY W/ RETROGRADES  06/12/2011   Procedure: CYSTOSCOPY WITH RETROGRADE PYELOGRAM;  Surgeon: Ailene Rud, MD;  Location: [facility];  Service: Urology;  Laterality: N/A;  cysto, urethral dilation, right retrograde pyelogram    . CYSTOSCOPY WITH RETROGRADE PYELOGRAM, URETEROSCOPY AND STENT PLACEMENT Left 03/08/2014   Procedure: CYSTOSCOPY WITH LEFT RETROGRADE PYELOGRAM/LEFT  URETEROSCOPY;  Surgeon: Ailene Rud, MD;  Location: [facility];  Service: Urology;  Laterality: Left;  . CYSTOSCOPY WITH URETHRAL DILATATION N/A 04/20/2013   Procedure: CYSTOSCOPY WITH URETHRAL DILATATION;  Surgeon: Ailene Rud, MD;  Location: [facility];  Service: Urology;  Laterality: N/A;  . EXTRACORPOREAL SHOCK WAVE LITHOTRIPSY  01-17-2007   LEFT  . HOLMIUM LASER APPLICATION Left 03/09/2351   Procedure: LASER OF LEFT RENAL PELVIC STONE;  Surgeon: Ailene Rud, MD;  Location: [facility];  Service: Urology;  Laterality: Left;  . INGUINAL HERNIA REPAIR  2003  . LEFT ACHILLES TENDON REPAIR  1992  . NASAL SINUS SURGERY  2012  . PENILE SURGERY  OF MEATUS  1955  . TRANSTHORACIC ECHOCARDIOGRAM  03-31-2011   NORMAL LVEF  59%/ TRIVIAL MR/ DIASTOLIC DYSFUNCTION/ MODERATE LVH  . TRANSURETHRAL RESECTION OF PROSTATE N/A 04/20/2013   Procedure: TRANSURETHRAL RESECTION OF THE PROSTATE WITH GYRUS INSTRUMENTS;  Surgeon: Ailene Rud, MD;  Location: [facility];  Service: Urology;  Laterality: N/A;    Current Medications: Current Meds  Medication Sig  . amLODipine-benazepril (LOTREL) 5-40 MG per capsule Take 1 capsule by mouth every morning.  Marland Kitchen apixaban (ELIQUIS) 5 MG TABS tablet Take 5 mg by mouth 2 (two)  times daily.  Marland Kitchen atorvastatin (LIPITOR) 40 MG tablet Take 40 mg by mouth at bedtime.   . carvedilol (COREG) 25 MG tablet Take 25 mg by mouth 2 (two) times daily with a meal.  . cholecalciferol (VITAMIN D) 1000 UNITS tablet Take 2,000 Units by mouth daily.  . ferrous sulfate 325 (65 FE) MG EC tablet Take 325 mg by mouth daily with breakfast.  . fish oil-omega-3 fatty acids 1000 MG capsule Take 1 g by mouth daily.   . fluticasone (FLONASE) 50 MCG/ACT nasal spray Place 1 spray into both nostrils daily as needed for allergies or rhinitis.  . furosemide (LASIX) 40 MG tablet TAKE 1 TABLET EVERY OTHER DAY  . nabumetone (RELAFEN) 500 MG tablet Take 500 mg by mouth 2 (two) times daily as needed for mild pain or moderate pain.  Marland Kitchen omeprazole (PRILOSEC) 20 MG capsule Take 20 mg by mouth every morning.     Allergies:   Hydrocodone   Social History   Social History  . Marital status: Married    Spouse name: N/A  . Number of children: N/A  . Years of education: N/A   Social History Main Topics  . Smoking status: Never Smoker  . Smokeless tobacco: Never Used  . Alcohol use No  . Drug use: No  . Sexual activity: Yes   Other Topics Concern  . None   Social History Narrative  . None     Family History: The patient's family history includes Breast cancer in his mother; Cirrhosis in his father. ROS:   Please see the history of present illness.    All 14 point review of systems negative except as described per history of present illness  EKGs/Labs/Other Studies Reviewed:      Recent Labs: No results found for requested labs within last 8760 hours.  Recent Lipid Panel No results found for: CHOL, TRIG, HDL, CHOLHDL, VLDL, LDLCALC, LDLDIRECT  Physical Exam:    VS:  BP 116/60   Pulse 80   Resp 12   Ht 5\' 9"  (1.753 m)   Wt 243 lb 12.8 oz (110.6 kg)   BMI 36.00 kg/m     Wt Readings from Last 3 Encounters:  10/27/16 243 lb 12.8 oz (110.6 kg)  10/01/16 248 lb (112.5 kg)  03/08/14 251  lb (113.9 kg)     GEN:  Well nourished, well developed in no acute distress HEENT: Normal NECK: No JVD; No carotid bruits LYMPHATICS: No lymphadenopathy CARDIAC: irregularly irregular, no murmurs, no rubs, no gallops RESPIRATORY:  Clear to auscultation without rales, wheezing or rhonchi  ABDOMEN: Soft, non-tender, non-distended MUSCULOSKELETAL:  No edema; No deformity  SKIN: Warm and dry LOWER EXTREMITIES: no swelling NEUROLOGIC:  Alert and oriented x 3 PSYCHIATRIC:  Normal affect   ASSESSMENT:    1. Chronic atrial fibrillation ([place])   2. Coronary artery disease involving native coronary artery of native heart without angina pectoris   3. Dyslipidemia    PLAN:  In order of problems listed above:  1. Chronic atrial fibrillation: Rate appears to be controlled we'll continue present medications including beta blocker which need to be given around surgical time. 2. Coronary artery disease: Stable, recent stress test negative 3. Dyslipidemia: We'll continue with statin. 4. Pre-op evaluation: Stable from cardiac standpoint to be to proceed with surgery, his anticoagulation will need to be withdrawn at least 24 hours before procedure. Beta blocker must be given around surgical time.   Medication Adjustments/Labs and Tests Ordered: Current medicines are reviewed at length with the patient today.  Concerns regarding medicines are outlined above.  No orders of the defined types were placed in this encounter.  Medication changes: No orders of the defined types were placed in this encounter.   Signed, Park Liter, MD, [facility] 10/27/2016 10:00 AM    [place]

## 2016-10-28 DIAGNOSIS — S8002XA Contusion of left knee, initial encounter: Secondary | ICD-10-CM | POA: Diagnosis not present

## 2016-10-28 DIAGNOSIS — M1712 Unilateral primary osteoarthritis, left knee: Secondary | ICD-10-CM | POA: Diagnosis not present

## 2016-11-10 DIAGNOSIS — M1711 Unilateral primary osteoarthritis, right knee: Secondary | ICD-10-CM | POA: Diagnosis not present

## 2016-11-10 DIAGNOSIS — K219 Gastro-esophageal reflux disease without esophagitis: Secondary | ICD-10-CM | POA: Diagnosis not present

## 2016-11-10 DIAGNOSIS — Z7901 Long term (current) use of anticoagulants: Secondary | ICD-10-CM | POA: Diagnosis not present

## 2016-11-10 DIAGNOSIS — I4891 Unspecified atrial fibrillation: Secondary | ICD-10-CM | POA: Diagnosis not present

## 2016-11-10 DIAGNOSIS — Z6835 Body mass index (BMI) 35.0-35.9, adult: Secondary | ICD-10-CM | POA: Diagnosis not present

## 2016-11-10 DIAGNOSIS — Z79899 Other long term (current) drug therapy: Secondary | ICD-10-CM | POA: Diagnosis not present

## 2016-11-10 DIAGNOSIS — E669 Obesity, unspecified: Secondary | ICD-10-CM | POA: Diagnosis not present

## 2016-11-10 DIAGNOSIS — J329 Chronic sinusitis, unspecified: Secondary | ICD-10-CM | POA: Diagnosis not present

## 2016-11-10 DIAGNOSIS — M23221 Derangement of posterior horn of medial meniscus due to old tear or injury, right knee: Secondary | ICD-10-CM | POA: Diagnosis not present

## 2016-11-10 DIAGNOSIS — E785 Hyperlipidemia, unspecified: Secondary | ICD-10-CM | POA: Diagnosis not present

## 2016-11-10 DIAGNOSIS — M659 Synovitis and tenosynovitis, unspecified: Secondary | ICD-10-CM | POA: Diagnosis not present

## 2016-11-10 DIAGNOSIS — J309 Allergic rhinitis, unspecified: Secondary | ICD-10-CM | POA: Diagnosis not present

## 2016-11-10 DIAGNOSIS — I10 Essential (primary) hypertension: Secondary | ICD-10-CM | POA: Diagnosis not present

## 2016-11-10 DIAGNOSIS — G4733 Obstructive sleep apnea (adult) (pediatric): Secondary | ICD-10-CM | POA: Diagnosis not present

## 2016-11-10 DIAGNOSIS — S83241A Other tear of medial meniscus, current injury, right knee, initial encounter: Secondary | ICD-10-CM | POA: Diagnosis not present

## 2016-11-16 DIAGNOSIS — M25561 Pain in right knee: Secondary | ICD-10-CM | POA: Diagnosis not present

## 2016-11-16 DIAGNOSIS — R262 Difficulty in walking, not elsewhere classified: Secondary | ICD-10-CM | POA: Diagnosis not present

## 2016-11-20 DIAGNOSIS — M25561 Pain in right knee: Secondary | ICD-10-CM | POA: Diagnosis not present

## 2016-11-20 DIAGNOSIS — R262 Difficulty in walking, not elsewhere classified: Secondary | ICD-10-CM | POA: Diagnosis not present

## 2016-11-23 DIAGNOSIS — M25561 Pain in right knee: Secondary | ICD-10-CM | POA: Diagnosis not present

## 2016-11-23 DIAGNOSIS — R262 Difficulty in walking, not elsewhere classified: Secondary | ICD-10-CM | POA: Diagnosis not present

## 2016-11-25 DIAGNOSIS — M25561 Pain in right knee: Secondary | ICD-10-CM | POA: Diagnosis not present

## 2016-11-25 DIAGNOSIS — R262 Difficulty in walking, not elsewhere classified: Secondary | ICD-10-CM | POA: Diagnosis not present

## 2016-11-30 DIAGNOSIS — M25561 Pain in right knee: Secondary | ICD-10-CM | POA: Diagnosis not present

## 2016-11-30 DIAGNOSIS — R262 Difficulty in walking, not elsewhere classified: Secondary | ICD-10-CM | POA: Diagnosis not present

## 2016-12-03 DIAGNOSIS — R262 Difficulty in walking, not elsewhere classified: Secondary | ICD-10-CM | POA: Diagnosis not present

## 2016-12-03 DIAGNOSIS — M25561 Pain in right knee: Secondary | ICD-10-CM | POA: Diagnosis not present

## 2016-12-08 DIAGNOSIS — R262 Difficulty in walking, not elsewhere classified: Secondary | ICD-10-CM | POA: Diagnosis not present

## 2016-12-08 DIAGNOSIS — M25561 Pain in right knee: Secondary | ICD-10-CM | POA: Diagnosis not present

## 2016-12-10 DIAGNOSIS — L578 Other skin changes due to chronic exposure to nonionizing radiation: Secondary | ICD-10-CM | POA: Diagnosis not present

## 2016-12-10 DIAGNOSIS — L570 Actinic keratosis: Secondary | ICD-10-CM | POA: Diagnosis not present

## 2016-12-11 DIAGNOSIS — M25561 Pain in right knee: Secondary | ICD-10-CM | POA: Diagnosis not present

## 2016-12-11 DIAGNOSIS — R262 Difficulty in walking, not elsewhere classified: Secondary | ICD-10-CM | POA: Diagnosis not present

## 2016-12-15 DIAGNOSIS — R262 Difficulty in walking, not elsewhere classified: Secondary | ICD-10-CM | POA: Diagnosis not present

## 2016-12-15 DIAGNOSIS — M25561 Pain in right knee: Secondary | ICD-10-CM | POA: Diagnosis not present

## 2016-12-16 DIAGNOSIS — R3912 Poor urinary stream: Secondary | ICD-10-CM | POA: Diagnosis not present

## 2016-12-16 DIAGNOSIS — R310 Gross hematuria: Secondary | ICD-10-CM | POA: Diagnosis not present

## 2016-12-16 DIAGNOSIS — N200 Calculus of kidney: Secondary | ICD-10-CM | POA: Diagnosis not present

## 2016-12-16 DIAGNOSIS — N401 Enlarged prostate with lower urinary tract symptoms: Secondary | ICD-10-CM | POA: Diagnosis not present

## 2016-12-18 DIAGNOSIS — M25561 Pain in right knee: Secondary | ICD-10-CM | POA: Diagnosis not present

## 2016-12-18 DIAGNOSIS — R262 Difficulty in walking, not elsewhere classified: Secondary | ICD-10-CM | POA: Diagnosis not present

## 2016-12-21 DIAGNOSIS — M25561 Pain in right knee: Secondary | ICD-10-CM | POA: Diagnosis not present

## 2016-12-21 DIAGNOSIS — R262 Difficulty in walking, not elsewhere classified: Secondary | ICD-10-CM | POA: Diagnosis not present

## 2016-12-30 ENCOUNTER — Ambulatory Visit: Payer: Medicare Other | Admitting: Cardiology

## 2016-12-31 DIAGNOSIS — M25511 Pain in right shoulder: Secondary | ICD-10-CM | POA: Diagnosis not present

## 2016-12-31 DIAGNOSIS — R262 Difficulty in walking, not elsewhere classified: Secondary | ICD-10-CM | POA: Diagnosis not present

## 2016-12-31 DIAGNOSIS — M25561 Pain in right knee: Secondary | ICD-10-CM | POA: Diagnosis not present

## 2017-01-01 ENCOUNTER — Encounter: Payer: Self-pay | Admitting: Cardiology

## 2017-01-01 ENCOUNTER — Ambulatory Visit (INDEPENDENT_AMBULATORY_CARE_PROVIDER_SITE_OTHER): Payer: Medicare Other | Admitting: Cardiology

## 2017-01-01 ENCOUNTER — Ambulatory Visit: Payer: Medicare Other | Admitting: Cardiology

## 2017-01-01 VITALS — BP 120/72 | HR 92 | Ht 69.0 in | Wt 250.0 lb

## 2017-01-01 DIAGNOSIS — I2510 Atherosclerotic heart disease of native coronary artery without angina pectoris: Secondary | ICD-10-CM | POA: Diagnosis not present

## 2017-01-01 DIAGNOSIS — I482 Chronic atrial fibrillation, unspecified: Secondary | ICD-10-CM

## 2017-01-01 DIAGNOSIS — E785 Hyperlipidemia, unspecified: Secondary | ICD-10-CM

## 2017-01-01 NOTE — Addendum Note (Signed)
Addended by: Jossie Ng on: 01/01/2017 09:45 AM   Modules accepted: Orders

## 2017-01-01 NOTE — Progress Notes (Signed)
Cardiology Office Note:    Date:  01/01/2017   ID:  Spencer Cochran, DOB 1945/10/10, MRN [id number]  PCP:  Maryella Shivers, MD  Cardiologist:  Jenne Campus, MD    Referring MD: Maryella Shivers, MD   Chief Complaint  Patient presents with  . Follow-up  Doing well from cardiac standpoint of view  History of Present Illness:    Spencer Cochran is a 71 y.o. male denies have any chest pain tightness squeezing pressure burning chest.  About 2 months ago he underwent right knee surgery there was some trial to repair his meniscus as well as clean arthritis.  In spite of the surgery he still complained of having a lot of pain in his right knee and he cannot walk without crachers.  Denies having any cardiac complaint sadly he started having some hematuria again and he cut down his Eliquis to only once daily I told him it is an appropriate he need to go back on twice daily Eliquis and if hematuria happened he needs to see his urologist he understands he will try to comply with that.  Past Medical History:  Diagnosis Date  . Arthritis knees and ankle  . Asymptomatic gallstones   . Cataract immature BILATERAL EYES  . Coronary artery disease CARDIOLOGIST-  DR Agustin Cree  Tia Alert)---  LAST VISIT 04-29-2011   DENIES CARDIAC SYMPTOMS  . DDD (degenerative disc disease), lumbar   . Dysrhythmia    A Fib   . Frequency of urination   . GERD (gastroesophageal reflux disease)   . Glaucoma   . Heart murmur   . Hypertension   . 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 Left ventricular diastolic dysfunction PER ECHO 03-31-2011  W/ CHART  . Nocturia   . OSA on CPAP   . Renal stones bilateral   no symptoms at this time  . Urethral stricture     Past Surgical History:  Procedure Laterality Date  . APPENDECTOMY  1972  . CARDIOVASCULAR STRESS TEST  04-29-2010   DR KRASAWSKI   NO EVIDENCE ISCHEMIA/ NORMAL LVSF AND WALL MOTION/ EF 63%  . CERVICAL FUSION  2006   C3 - 6  . colonscopy     . CYSTO/ URETHRAL DILATION/ TRANSURETHRAL INCISIONOF PROSTATE  01-13-2007  . CYSTOSCOPY  2005  . CYSTOSCOPY W/ RETROGRADES  06/12/2011   Procedure: CYSTOSCOPY WITH RETROGRADE PYELOGRAM;  Surgeon: Ailene Rud, MD;  [facility];  Service: Urology;  Laterality: N/A;  cysto, urethral dilation, right retrograde pyelogram    . CYSTOSCOPY WITH RETROGRADE PYELOGRAM, URETEROSCOPY AND STENT PLACEMENT Left 03/08/2014   Procedure: CYSTOSCOPY WITH LEFT RETROGRADE PYELOGRAM/LEFT  URETEROSCOPY;  Surgeon: Ailene Rud, MD;  Location[facility];  Service: Urology;  Laterality: Left;  . CYSTOSCOPY WITH URETHRAL DILATATION N/A 04/20/2013   Procedure: CYSTOSCOPY WITH URETHRAL DILATATION;  Surgeon: Ailene Rud, MD;  Location: [facility];  Service: Urology;  Laterality: N/A;  . EXTRACORPOREAL SHOCK WAVE LITHOTRIPSY  01-17-2007   LEFT  . HOLMIUM LASER APPLICATION Left 07/03/4429   Procedure: LASER OF LEFT RENAL PELVIC STONE;  Surgeon: Ailene Rud, MD;  Location: [facility];  Service: Urology;  Laterality: Left;  . INGUINAL HERNIA REPAIR  2003  . LEFT ACHILLES TENDON REPAIR  1992  . NASAL SINUS SURGERY  2012  . PENILE SURGERY  OF MEATUS  1955  . TRANSTHORACIC ECHOCARDIOGRAM  03-31-2011   NORMAL LVEF  59%/ TRIVIAL MR/ DIASTOLIC DYSFUNCTION/  MODERATE LVH  . TRANSURETHRAL RESECTION OF PROSTATE N/A 04/20/2013   Procedure: TRANSURETHRAL RESECTION OF THE PROSTATE WITH GYRUS INSTRUMENTS;  Surgeon: Ailene Rud, MD;  Location: [facility];  Service: Urology;  Laterality: N/A;    Current Medications: Current Meds  Medication Sig  . amLODipine-benazepril (LOTREL) 5-40 MG per capsule Take 1 capsule by mouth every morning.  Marland Kitchen apixaban (ELIQUIS) 5 MG TABS tablet Take 5 mg by mouth 2 (two) times daily.  Marland Kitchen atorvastatin (LIPITOR) 40 MG tablet Take 40 mg by mouth at bedtime.   . carvedilol (COREG) 25 MG tablet Take 25 mg by mouth 2 (two) times daily with a  meal.  . cholecalciferol (VITAMIN D) 1000 UNITS tablet Take 2,000 Units by mouth daily.  . ferrous sulfate 325 (65 FE) MG EC tablet Take 325 mg by mouth daily with breakfast.  . fish oil-omega-3 fatty acids 1000 MG capsule Take 1 g by mouth daily.   . fluticasone (FLONASE) 50 MCG/ACT nasal spray Place 1 spray into both nostrils daily as needed for allergies or rhinitis.  . furosemide (LASIX) 40 MG tablet TAKE 1 TABLET EVERY OTHER DAY  . nabumetone (RELAFEN) 500 MG tablet Take 500 mg by mouth 2 (two) times daily as needed for mild pain or moderate pain.  Marland Kitchen omeprazole (PRILOSEC) 20 MG capsule Take 20 mg by mouth every morning.     Allergies:   Hydrocodone   Social History   Socioeconomic History  . Marital status: Married    Spouse name: None  . Number of children: None  . Years of education: None  . Highest education level: None  Social Needs  . Financial resource strain: None  . Food insecurity - worry: None  . Food insecurity - inability: None  . Transportation needs - medical: None  . Transportation needs - non-medical: None  Occupational History  . None  Tobacco Use  . Smoking status: Never Smoker  . Smokeless tobacco: Never Used  Substance and Sexual Activity  . Alcohol use: No  . Drug use: No  . Sexual activity: Yes  Other Topics Concern  . None  Social History Narrative  . None     Family History: The patient's family history includes Breast cancer in his mother; Cirrhosis in his father. ROS:   Please see the history of present illness.    All 14 point review of systems negative except as described per history of present illness  EKGs/Labs/Other Studies Reviewed:      Recent Labs: No results found for requested labs within last 8760 hours.  Recent Lipid Panel No results found for: CHOL, TRIG, HDL, CHOLHDL, VLDL, LDLCALC, LDLDIRECT  Physical Exam:    VS:  BP 120/72 (BP Location: Right Arm, Patient Position: Sitting, Cuff Size: Normal)   Pulse 92   Ht  5\' 9"  (1.753 m)   Wt 250 lb (113.4 kg)   SpO2 98%   BMI 36.92 kg/m     Wt Readings from Last 3 Encounters:  01/01/17 250 lb (113.4 kg)  10/27/16 243 lb 12.8 oz (110.6 kg)  10/01/16 248 lb (112.5 kg)     GEN:  Well nourished, well developed in no acute distress HEENT: Normal NECK: No JVD; No carotid bruits LYMPHATICS: No lymphadenopathy CARDIAC: RRR, no murmurs, no rubs, no gallops RESPIRATORY:  Clear to auscultation without rales, wheezing or rhonchi  ABDOMEN: Soft, non-tender, non-distended MUSCULOSKELETAL:  No edema; No deformity  SKIN: Warm and dry LOWER EXTREMITIES: no swelling NEUROLOGIC:  Alert and  oriented x 3 PSYCHIATRIC:  Normal affect   ASSESSMENT:    1. Chronic atrial fibrillation ([place])   2. Coronary artery disease involving native coronary artery of native heart without angina pectoris   3. Dyslipidemia    PLAN:    In order of problems listed above:  1. Chronic atrial fibrillation: Rate controlled will resume proper dose of anticoagulation. 2. Artery disease: Stable went to surgery with no difficulties we will continue present management. 3. Dyslipidemia: Will schedule him to have fasting lipid profile done.   Medication Adjustments/Labs and Tests Ordered: Current medicines are reviewed at length with the patient today.  Concerns regarding medicines are outlined above.  No orders of the defined types were placed in this encounter.  Medication changes: No orders of the defined types were placed in this encounter.   Signed, Park Liter, MD, [facility] 01/01/2017 9:41 AM    [place]

## 2017-01-01 NOTE — Patient Instructions (Signed)
Medication Instructions:  Your physician recommends that you continue on your current medications as directed. Please refer to the Current Medication list given to you today.  Labwork: Your physician recommends that you return for lab work today. Lipid panel  Testing/Procedures: None  Follow-Up: Your physician wants you to follow-up in: 4 months. You will receive a reminder letter in the mail two months in advance. If you don't receive a letter, please call our office to schedule the follow-up appointment.  Any Other Special Instructions Will Be Listed Below (If Applicable).     If you need a refill on your cardiac medications before your next appointment, please call your pharmacy.

## 2017-01-04 DIAGNOSIS — M25561 Pain in right knee: Secondary | ICD-10-CM | POA: Diagnosis not present

## 2017-01-04 DIAGNOSIS — R262 Difficulty in walking, not elsewhere classified: Secondary | ICD-10-CM | POA: Diagnosis not present

## 2017-01-05 ENCOUNTER — Telehealth: Payer: Self-pay | Admitting: Cardiology

## 2017-01-05 DIAGNOSIS — R310 Gross hematuria: Secondary | ICD-10-CM | POA: Diagnosis not present

## 2017-01-05 NOTE — Telephone Encounter (Signed)
Called and left voicemail for the form to be resent as we have yet to receive it.

## 2017-01-05 NOTE — Telephone Encounter (Signed)
Ivin Booty from Dr. Samule Dry office calling regarding clearance and labs on patient, they are faxing [place] a pre-op surgiccal form for [place] to fill out with labs that they normally do, please call Ivin Booty at [email or web address] Kitchen

## 2017-01-05 NOTE — Telephone Encounter (Signed)
Wants lab order faxed to [facility]

## 2017-01-06 NOTE — Telephone Encounter (Signed)
Please follow up

## 2017-01-06 NOTE — Telephone Encounter (Signed)
Left voicemail for patient that he should be able to go to [facility] to have labs and that they should be able to pull it up.

## 2017-01-07 DIAGNOSIS — Z79899 Other long term (current) drug therapy: Secondary | ICD-10-CM | POA: Diagnosis not present

## 2017-01-07 DIAGNOSIS — R52 Pain, unspecified: Secondary | ICD-10-CM | POA: Diagnosis not present

## 2017-01-07 DIAGNOSIS — E559 Vitamin D deficiency, unspecified: Secondary | ICD-10-CM | POA: Diagnosis not present

## 2017-01-07 DIAGNOSIS — Z01818 Encounter for other preprocedural examination: Secondary | ICD-10-CM | POA: Diagnosis not present

## 2017-01-07 DIAGNOSIS — R0989 Other specified symptoms and signs involving the circulatory and respiratory systems: Secondary | ICD-10-CM | POA: Diagnosis not present

## 2017-01-07 DIAGNOSIS — M79609 Pain in unspecified limb: Secondary | ICD-10-CM | POA: Diagnosis not present

## 2017-01-07 LAB — PROTIME-INR

## 2017-01-07 NOTE — Telephone Encounter (Signed)
Pre-op has been faxed

## 2017-01-13 DIAGNOSIS — I2510 Atherosclerotic heart disease of native coronary artery without angina pectoris: Secondary | ICD-10-CM | POA: Diagnosis not present

## 2017-01-13 DIAGNOSIS — M1711 Unilateral primary osteoarthritis, right knee: Secondary | ICD-10-CM | POA: Diagnosis not present

## 2017-01-13 DIAGNOSIS — I4891 Unspecified atrial fibrillation: Secondary | ICD-10-CM | POA: Diagnosis not present

## 2017-01-13 DIAGNOSIS — R7303 Prediabetes: Secondary | ICD-10-CM | POA: Diagnosis not present

## 2017-01-14 DIAGNOSIS — H401132 Primary open-angle glaucoma, bilateral, moderate stage: Secondary | ICD-10-CM | POA: Diagnosis not present

## 2017-01-14 DIAGNOSIS — H47093 Other disorders of optic nerve, not elsewhere classified, bilateral: Secondary | ICD-10-CM | POA: Diagnosis not present

## 2017-01-14 DIAGNOSIS — H47233 Glaucomatous optic atrophy, bilateral: Secondary | ICD-10-CM | POA: Diagnosis not present

## 2017-01-20 ENCOUNTER — Telehealth: Payer: Self-pay | Admitting: Cardiology

## 2017-01-20 ENCOUNTER — Other Ambulatory Visit: Payer: Self-pay

## 2017-01-20 NOTE — Telephone Encounter (Signed)
Patient would like to switch to the Generic Eliquis, please call to the [facility] with [employer]. If any problem let patient know.

## 2017-01-20 NOTE — Telephone Encounter (Signed)
Informed patient that eliquis has not went generic yet; he was instructed to call his pharmacy and inquire when to expect the medication to lower cost.

## 2017-01-21 ENCOUNTER — Other Ambulatory Visit: Payer: Self-pay

## 2017-01-21 MED ORDER — NITROGLYCERIN 0.4 MG SL SUBL: 0.4000 mg | SUBLINGUAL_TABLET | SUBLINGUAL | 6 refills | Status: DC | PRN

## 2017-02-01 DIAGNOSIS — Z789 Other specified health status: Secondary | ICD-10-CM | POA: Diagnosis not present

## 2017-02-01 DIAGNOSIS — M1711 Unilateral primary osteoarthritis, right knee: Secondary | ICD-10-CM | POA: Diagnosis not present

## 2017-02-02 HISTORY — PX: COLONOSCOPY: SHX5424

## 2017-02-05 DIAGNOSIS — Z6835 Body mass index (BMI) 35.0-35.9, adult: Secondary | ICD-10-CM | POA: Diagnosis not present

## 2017-02-05 DIAGNOSIS — R7303 Prediabetes: Secondary | ICD-10-CM | POA: Diagnosis not present

## 2017-02-06 ENCOUNTER — Other Ambulatory Visit: Payer: Self-pay | Admitting: Cardiology

## 2017-02-09 DIAGNOSIS — L578 Other skin changes due to chronic exposure to nonionizing radiation: Secondary | ICD-10-CM | POA: Diagnosis not present

## 2017-02-09 DIAGNOSIS — L821 Other seborrheic keratosis: Secondary | ICD-10-CM | POA: Diagnosis not present

## 2017-02-09 DIAGNOSIS — I8310 Varicose veins of unspecified lower extremity with inflammation: Secondary | ICD-10-CM | POA: Diagnosis not present

## 2017-02-09 DIAGNOSIS — L570 Actinic keratosis: Secondary | ICD-10-CM | POA: Diagnosis not present

## 2017-02-11 DIAGNOSIS — M25511 Pain in right shoulder: Secondary | ICD-10-CM | POA: Diagnosis not present

## 2017-02-16 DIAGNOSIS — Z01818 Encounter for other preprocedural examination: Secondary | ICD-10-CM | POA: Diagnosis not present

## 2017-02-16 DIAGNOSIS — M19011 Primary osteoarthritis, right shoulder: Secondary | ICD-10-CM | POA: Diagnosis not present

## 2017-02-16 DIAGNOSIS — M1711 Unilateral primary osteoarthritis, right knee: Secondary | ICD-10-CM | POA: Diagnosis not present

## 2017-02-16 DIAGNOSIS — M25511 Pain in right shoulder: Secondary | ICD-10-CM | POA: Diagnosis not present

## 2017-02-17 DIAGNOSIS — M25511 Pain in right shoulder: Secondary | ICD-10-CM | POA: Diagnosis not present

## 2017-02-17 DIAGNOSIS — M75101 Unspecified rotator cuff tear or rupture of right shoulder, not specified as traumatic: Secondary | ICD-10-CM | POA: Diagnosis not present

## 2017-02-23 DIAGNOSIS — I2510 Atherosclerotic heart disease of native coronary artery without angina pectoris: Secondary | ICD-10-CM | POA: Diagnosis present

## 2017-02-23 DIAGNOSIS — I119 Hypertensive heart disease without heart failure: Secondary | ICD-10-CM | POA: Diagnosis present

## 2017-02-23 DIAGNOSIS — R011 Cardiac murmur, unspecified: Secondary | ICD-10-CM | POA: Diagnosis present

## 2017-02-23 DIAGNOSIS — H409 Unspecified glaucoma: Secondary | ICD-10-CM | POA: Diagnosis present

## 2017-02-23 DIAGNOSIS — N400 Enlarged prostate without lower urinary tract symptoms: Secondary | ICD-10-CM | POA: Diagnosis present

## 2017-02-23 DIAGNOSIS — Z7902 Long term (current) use of antithrombotics/antiplatelets: Secondary | ICD-10-CM | POA: Diagnosis not present

## 2017-02-23 DIAGNOSIS — G4733 Obstructive sleep apnea (adult) (pediatric): Secondary | ICD-10-CM | POA: Diagnosis present

## 2017-02-23 DIAGNOSIS — E785 Hyperlipidemia, unspecified: Secondary | ICD-10-CM | POA: Diagnosis not present

## 2017-02-23 DIAGNOSIS — H9190 Unspecified hearing loss, unspecified ear: Secondary | ICD-10-CM | POA: Diagnosis present

## 2017-02-23 DIAGNOSIS — Z471 Aftercare following joint replacement surgery: Secondary | ICD-10-CM | POA: Diagnosis not present

## 2017-02-23 DIAGNOSIS — Z96651 Presence of right artificial knee joint: Secondary | ICD-10-CM | POA: Diagnosis not present

## 2017-02-23 DIAGNOSIS — M1711 Unilateral primary osteoarthritis, right knee: Secondary | ICD-10-CM | POA: Diagnosis not present

## 2017-02-23 DIAGNOSIS — Z885 Allergy status to narcotic agent status: Secondary | ICD-10-CM | POA: Diagnosis not present

## 2017-02-23 DIAGNOSIS — I10 Essential (primary) hypertension: Secondary | ICD-10-CM | POA: Diagnosis not present

## 2017-02-23 DIAGNOSIS — Z22321 Carrier or suspected carrier of Methicillin susceptible Staphylococcus aureus: Secondary | ICD-10-CM | POA: Diagnosis not present

## 2017-02-23 DIAGNOSIS — K219 Gastro-esophageal reflux disease without esophagitis: Secondary | ICD-10-CM | POA: Diagnosis not present

## 2017-02-23 DIAGNOSIS — M8580 Other specified disorders of bone density and structure, unspecified site: Secondary | ICD-10-CM | POA: Diagnosis present

## 2017-02-23 DIAGNOSIS — I4891 Unspecified atrial fibrillation: Secondary | ICD-10-CM | POA: Diagnosis not present

## 2017-02-23 DIAGNOSIS — Z79899 Other long term (current) drug therapy: Secondary | ICD-10-CM | POA: Diagnosis not present

## 2017-02-23 DIAGNOSIS — I482 Chronic atrial fibrillation: Secondary | ICD-10-CM | POA: Diagnosis not present

## 2017-02-27 DIAGNOSIS — M1991 Primary osteoarthritis, unspecified site: Secondary | ICD-10-CM | POA: Diagnosis not present

## 2017-02-27 DIAGNOSIS — Z96651 Presence of right artificial knee joint: Secondary | ICD-10-CM | POA: Diagnosis not present

## 2017-02-27 DIAGNOSIS — Z7901 Long term (current) use of anticoagulants: Secondary | ICD-10-CM | POA: Diagnosis not present

## 2017-02-27 DIAGNOSIS — I10 Essential (primary) hypertension: Secondary | ICD-10-CM | POA: Diagnosis not present

## 2017-02-27 DIAGNOSIS — E119 Type 2 diabetes mellitus without complications: Secondary | ICD-10-CM | POA: Diagnosis not present

## 2017-02-27 DIAGNOSIS — Z471 Aftercare following joint replacement surgery: Secondary | ICD-10-CM | POA: Diagnosis not present

## 2017-03-01 DIAGNOSIS — Z96651 Presence of right artificial knee joint: Secondary | ICD-10-CM | POA: Diagnosis not present

## 2017-03-01 DIAGNOSIS — I10 Essential (primary) hypertension: Secondary | ICD-10-CM | POA: Diagnosis not present

## 2017-03-01 DIAGNOSIS — Z471 Aftercare following joint replacement surgery: Secondary | ICD-10-CM | POA: Diagnosis not present

## 2017-03-01 DIAGNOSIS — Z7901 Long term (current) use of anticoagulants: Secondary | ICD-10-CM | POA: Diagnosis not present

## 2017-03-01 DIAGNOSIS — E119 Type 2 diabetes mellitus without complications: Secondary | ICD-10-CM | POA: Diagnosis not present

## 2017-03-01 DIAGNOSIS — M1991 Primary osteoarthritis, unspecified site: Secondary | ICD-10-CM | POA: Diagnosis not present

## 2017-03-03 DIAGNOSIS — Z7901 Long term (current) use of anticoagulants: Secondary | ICD-10-CM | POA: Diagnosis not present

## 2017-03-03 DIAGNOSIS — Z471 Aftercare following joint replacement surgery: Secondary | ICD-10-CM | POA: Diagnosis not present

## 2017-03-03 DIAGNOSIS — I10 Essential (primary) hypertension: Secondary | ICD-10-CM | POA: Diagnosis not present

## 2017-03-03 DIAGNOSIS — M1991 Primary osteoarthritis, unspecified site: Secondary | ICD-10-CM | POA: Diagnosis not present

## 2017-03-03 DIAGNOSIS — Z96651 Presence of right artificial knee joint: Secondary | ICD-10-CM | POA: Diagnosis not present

## 2017-03-03 DIAGNOSIS — E119 Type 2 diabetes mellitus without complications: Secondary | ICD-10-CM | POA: Diagnosis not present

## 2017-03-04 DIAGNOSIS — M1991 Primary osteoarthritis, unspecified site: Secondary | ICD-10-CM | POA: Diagnosis not present

## 2017-03-04 DIAGNOSIS — Z7901 Long term (current) use of anticoagulants: Secondary | ICD-10-CM | POA: Diagnosis not present

## 2017-03-04 DIAGNOSIS — I10 Essential (primary) hypertension: Secondary | ICD-10-CM | POA: Diagnosis not present

## 2017-03-04 DIAGNOSIS — Z471 Aftercare following joint replacement surgery: Secondary | ICD-10-CM | POA: Diagnosis not present

## 2017-03-04 DIAGNOSIS — E119 Type 2 diabetes mellitus without complications: Secondary | ICD-10-CM | POA: Diagnosis not present

## 2017-03-04 DIAGNOSIS — Z96651 Presence of right artificial knee joint: Secondary | ICD-10-CM | POA: Diagnosis not present

## 2017-03-05 DIAGNOSIS — I10 Essential (primary) hypertension: Secondary | ICD-10-CM | POA: Diagnosis not present

## 2017-03-05 DIAGNOSIS — M1991 Primary osteoarthritis, unspecified site: Secondary | ICD-10-CM | POA: Diagnosis not present

## 2017-03-05 DIAGNOSIS — Z7901 Long term (current) use of anticoagulants: Secondary | ICD-10-CM | POA: Diagnosis not present

## 2017-03-05 DIAGNOSIS — E119 Type 2 diabetes mellitus without complications: Secondary | ICD-10-CM | POA: Diagnosis not present

## 2017-03-05 DIAGNOSIS — Z96651 Presence of right artificial knee joint: Secondary | ICD-10-CM | POA: Diagnosis not present

## 2017-03-05 DIAGNOSIS — Z471 Aftercare following joint replacement surgery: Secondary | ICD-10-CM | POA: Diagnosis not present

## 2017-03-09 DIAGNOSIS — Z4789 Encounter for other orthopedic aftercare: Secondary | ICD-10-CM | POA: Diagnosis not present

## 2017-03-09 DIAGNOSIS — R262 Difficulty in walking, not elsewhere classified: Secondary | ICD-10-CM | POA: Diagnosis not present

## 2017-03-09 DIAGNOSIS — M25561 Pain in right knee: Secondary | ICD-10-CM | POA: Diagnosis not present

## 2017-03-11 DIAGNOSIS — Z4789 Encounter for other orthopedic aftercare: Secondary | ICD-10-CM | POA: Diagnosis not present

## 2017-03-11 DIAGNOSIS — M25561 Pain in right knee: Secondary | ICD-10-CM | POA: Diagnosis not present

## 2017-03-11 DIAGNOSIS — R262 Difficulty in walking, not elsewhere classified: Secondary | ICD-10-CM | POA: Diagnosis not present

## 2017-03-15 DIAGNOSIS — M25561 Pain in right knee: Secondary | ICD-10-CM | POA: Diagnosis not present

## 2017-03-15 DIAGNOSIS — Z4789 Encounter for other orthopedic aftercare: Secondary | ICD-10-CM | POA: Diagnosis not present

## 2017-03-15 DIAGNOSIS — R262 Difficulty in walking, not elsewhere classified: Secondary | ICD-10-CM | POA: Diagnosis not present

## 2017-03-18 DIAGNOSIS — Z4789 Encounter for other orthopedic aftercare: Secondary | ICD-10-CM | POA: Diagnosis not present

## 2017-03-18 DIAGNOSIS — M25561 Pain in right knee: Secondary | ICD-10-CM | POA: Diagnosis not present

## 2017-03-18 DIAGNOSIS — R262 Difficulty in walking, not elsewhere classified: Secondary | ICD-10-CM | POA: Diagnosis not present

## 2017-03-19 ENCOUNTER — Encounter: Payer: Self-pay | Admitting: Sports Medicine

## 2017-03-19 ENCOUNTER — Ambulatory Visit (INDEPENDENT_AMBULATORY_CARE_PROVIDER_SITE_OTHER): Payer: Medicare Other | Admitting: Sports Medicine

## 2017-03-19 DIAGNOSIS — M79675 Pain in left toe(s): Secondary | ICD-10-CM | POA: Diagnosis not present

## 2017-03-19 DIAGNOSIS — M79674 Pain in right toe(s): Secondary | ICD-10-CM

## 2017-03-19 DIAGNOSIS — Z7901 Long term (current) use of anticoagulants: Secondary | ICD-10-CM

## 2017-03-19 DIAGNOSIS — B351 Tinea unguium: Secondary | ICD-10-CM

## 2017-03-19 DIAGNOSIS — Z872 Personal history of diseases of the skin and subcutaneous tissue: Secondary | ICD-10-CM | POA: Diagnosis not present

## 2017-03-21 ENCOUNTER — Encounter: Payer: Self-pay | Admitting: Sports Medicine

## 2017-03-21 NOTE — Progress Notes (Addendum)
Subjective: Spencer Cochran is a 72 y.o. male patient seen today in office with complaint of mildly painful thickened and elongated toenails; unable to trim. Patient denies history of Diabetes or Neuropathy. On blood thinner for history of Cardiac/ A Fib. Patient has no other pedal complaints at this time.   Patient Active Problem List   Diagnosis Date Noted  . Coronary artery disease 10/01/2016  . Dyslipidemia 10/01/2016  . Atrial fibrillation ([place]) 03/08/2014  . Benign prostatic hypertrophy with incomplete bladder emptying 04/20/2013    Current Outpatient Medications on File Prior to Visit  Medication Sig Dispense Refill  . amLODipine-benazepril (LOTREL) 5-40 MG capsule TAKE 1 CAPSULE DAILY 90 capsule 1  . apixaban (ELIQUIS) 5 MG TABS tablet Take 5 mg by mouth 2 (two) times daily.    Marland Kitchen atorvastatin (LIPITOR) 40 MG tablet Take 40 mg by mouth at bedtime.     . carvedilol (COREG) 25 MG tablet Take 25 mg by mouth 2 (two) times daily with a meal.    . cholecalciferol (VITAMIN D) 1000 UNITS tablet Take 2,000 Units by mouth daily.    . ferrous sulfate 325 (65 FE) MG EC tablet Take 325 mg by mouth daily with breakfast.    . fish oil-omega-3 fatty acids 1000 MG capsule Take 1 g by mouth daily.     . fluticasone (FLONASE) 50 MCG/ACT nasal spray Place 1 spray into both nostrils daily as needed for allergies or rhinitis.    . furosemide (LASIX) 40 MG tablet TAKE 1 TABLET EVERY OTHER DAY 45 tablet 1  . nabumetone (RELAFEN) 500 MG tablet Take 500 mg by mouth 2 (two) times daily as needed for mild pain or moderate pain.    . nitroGLYCERIN (NITROSTAT) 0.4 MG SL tablet Place 1 tablet (0.4 mg total) under the tongue every 5 (five) minutes as needed for chest pain. 11 tablet 6  . omeprazole (PRILOSEC) 20 MG capsule Take 20 mg by mouth every morning.     No current facility-administered medications on file prior to visit.     Allergies  Allergen Reactions  . Hydrocodone Anxiety    Objective: Physical  Exam  General: Well developed, nourished, no acute distress, awake, alert and oriented x 3  Vascular: Dorsalis pedis artery 1/4 bilateral, Posterior tibial artery 1/4 bilateral, skin temperature warm to warm proximal to distal bilateral lower extremities, mild varicosities, scant pedal hair present bilateral.  Neurological: Gross sensation present via light touch bilateral.   Dermatological: Skin is warm, dry, and supple bilateral, Nails 1-10 are tender, long, thick, and discolored with mild subungal debris, no acute ingrowing, no webspace macerations present bilateral, no open lesions present bilateral, no callus/corns/hyperkeratotic tissue present bilateral. No signs of infection bilateral.  Musculoskeletal: No current symptomatic boney deformities noted bilateral. Muscular strength within normal limits without painon range of motion. No pain with calf compression bilateral.  Assessment and Plan:  Problem List Items Addressed This Visit    None    Visit Diagnoses    Pain due to onychomycosis of toenails of both feet    -  Primary   History of ingrowing nail       Long term current use of anticoagulant therapy         -Examined patient.  -Discussed treatment options for painful mycotic nails. -Mechanically debrided and reduced mycotic nails with sterile nail nipper and dremel nail file without incident. -ABN Signed  -Patient to return in 3 months for follow up evaluation or sooner if symptoms worsen.  Landis Martins, DPM

## 2017-03-23 DIAGNOSIS — M25561 Pain in right knee: Secondary | ICD-10-CM | POA: Diagnosis not present

## 2017-03-23 DIAGNOSIS — R262 Difficulty in walking, not elsewhere classified: Secondary | ICD-10-CM | POA: Diagnosis not present

## 2017-03-23 DIAGNOSIS — Z4789 Encounter for other orthopedic aftercare: Secondary | ICD-10-CM | POA: Diagnosis not present

## 2017-03-25 DIAGNOSIS — M25561 Pain in right knee: Secondary | ICD-10-CM | POA: Diagnosis not present

## 2017-03-25 DIAGNOSIS — R262 Difficulty in walking, not elsewhere classified: Secondary | ICD-10-CM | POA: Diagnosis not present

## 2017-03-25 DIAGNOSIS — Z4789 Encounter for other orthopedic aftercare: Secondary | ICD-10-CM | POA: Diagnosis not present

## 2017-03-29 DIAGNOSIS — R262 Difficulty in walking, not elsewhere classified: Secondary | ICD-10-CM | POA: Diagnosis not present

## 2017-03-29 DIAGNOSIS — Z4789 Encounter for other orthopedic aftercare: Secondary | ICD-10-CM | POA: Diagnosis not present

## 2017-03-29 DIAGNOSIS — M25561 Pain in right knee: Secondary | ICD-10-CM | POA: Diagnosis not present

## 2017-04-01 DIAGNOSIS — R262 Difficulty in walking, not elsewhere classified: Secondary | ICD-10-CM | POA: Diagnosis not present

## 2017-04-01 DIAGNOSIS — Z4789 Encounter for other orthopedic aftercare: Secondary | ICD-10-CM | POA: Diagnosis not present

## 2017-04-01 DIAGNOSIS — M25561 Pain in right knee: Secondary | ICD-10-CM | POA: Diagnosis not present

## 2017-04-05 DIAGNOSIS — Z4789 Encounter for other orthopedic aftercare: Secondary | ICD-10-CM | POA: Diagnosis not present

## 2017-04-05 DIAGNOSIS — M25561 Pain in right knee: Secondary | ICD-10-CM | POA: Diagnosis not present

## 2017-04-05 DIAGNOSIS — R262 Difficulty in walking, not elsewhere classified: Secondary | ICD-10-CM | POA: Diagnosis not present

## 2017-04-05 DIAGNOSIS — Z96651 Presence of right artificial knee joint: Secondary | ICD-10-CM | POA: Diagnosis not present

## 2017-04-05 DIAGNOSIS — M1711 Unilateral primary osteoarthritis, right knee: Secondary | ICD-10-CM | POA: Diagnosis not present

## 2017-04-08 DIAGNOSIS — Z4789 Encounter for other orthopedic aftercare: Secondary | ICD-10-CM | POA: Diagnosis not present

## 2017-04-08 DIAGNOSIS — R262 Difficulty in walking, not elsewhere classified: Secondary | ICD-10-CM | POA: Diagnosis not present

## 2017-04-08 DIAGNOSIS — M25561 Pain in right knee: Secondary | ICD-10-CM | POA: Diagnosis not present

## 2017-04-12 ENCOUNTER — Ambulatory Visit: Payer: Medicare Other | Admitting: Podiatry

## 2017-04-12 DIAGNOSIS — M25561 Pain in right knee: Secondary | ICD-10-CM | POA: Diagnosis not present

## 2017-04-12 DIAGNOSIS — Z4789 Encounter for other orthopedic aftercare: Secondary | ICD-10-CM | POA: Diagnosis not present

## 2017-04-12 DIAGNOSIS — R262 Difficulty in walking, not elsewhere classified: Secondary | ICD-10-CM | POA: Diagnosis not present

## 2017-04-14 DIAGNOSIS — H401132 Primary open-angle glaucoma, bilateral, moderate stage: Secondary | ICD-10-CM | POA: Diagnosis not present

## 2017-04-14 DIAGNOSIS — H47233 Glaucomatous optic atrophy, bilateral: Secondary | ICD-10-CM | POA: Diagnosis not present

## 2017-04-14 DIAGNOSIS — I10 Essential (primary) hypertension: Secondary | ICD-10-CM | POA: Diagnosis not present

## 2017-04-15 DIAGNOSIS — R262 Difficulty in walking, not elsewhere classified: Secondary | ICD-10-CM | POA: Diagnosis not present

## 2017-04-15 DIAGNOSIS — M25561 Pain in right knee: Secondary | ICD-10-CM | POA: Diagnosis not present

## 2017-04-15 DIAGNOSIS — Z4789 Encounter for other orthopedic aftercare: Secondary | ICD-10-CM | POA: Diagnosis not present

## 2017-04-19 DIAGNOSIS — M25561 Pain in right knee: Secondary | ICD-10-CM | POA: Diagnosis not present

## 2017-04-19 DIAGNOSIS — Z4789 Encounter for other orthopedic aftercare: Secondary | ICD-10-CM | POA: Diagnosis not present

## 2017-04-19 DIAGNOSIS — R262 Difficulty in walking, not elsewhere classified: Secondary | ICD-10-CM | POA: Diagnosis not present

## 2017-04-21 ENCOUNTER — Encounter: Payer: Self-pay | Admitting: Gastroenterology

## 2017-04-27 DIAGNOSIS — R262 Difficulty in walking, not elsewhere classified: Secondary | ICD-10-CM | POA: Diagnosis not present

## 2017-04-27 DIAGNOSIS — M25561 Pain in right knee: Secondary | ICD-10-CM | POA: Diagnosis not present

## 2017-04-27 DIAGNOSIS — Z4789 Encounter for other orthopedic aftercare: Secondary | ICD-10-CM | POA: Diagnosis not present

## 2017-04-28 DIAGNOSIS — N401 Enlarged prostate with lower urinary tract symptoms: Secondary | ICD-10-CM | POA: Diagnosis not present

## 2017-04-28 DIAGNOSIS — R310 Gross hematuria: Secondary | ICD-10-CM | POA: Diagnosis not present

## 2017-04-28 DIAGNOSIS — R351 Nocturia: Secondary | ICD-10-CM | POA: Diagnosis not present

## 2017-04-30 ENCOUNTER — Ambulatory Visit (INDEPENDENT_AMBULATORY_CARE_PROVIDER_SITE_OTHER): Payer: Medicare Other | Admitting: Cardiology

## 2017-04-30 ENCOUNTER — Encounter: Payer: Self-pay | Admitting: *Deleted

## 2017-04-30 ENCOUNTER — Encounter: Payer: Self-pay | Admitting: Cardiology

## 2017-04-30 VITALS — BP 120/70 | HR 96 | Ht 69.0 in | Wt 240.0 lb

## 2017-04-30 DIAGNOSIS — I482 Chronic atrial fibrillation, unspecified: Secondary | ICD-10-CM

## 2017-04-30 DIAGNOSIS — I2510 Atherosclerotic heart disease of native coronary artery without angina pectoris: Secondary | ICD-10-CM | POA: Diagnosis not present

## 2017-04-30 DIAGNOSIS — R262 Difficulty in walking, not elsewhere classified: Secondary | ICD-10-CM | POA: Diagnosis not present

## 2017-04-30 DIAGNOSIS — Z4789 Encounter for other orthopedic aftercare: Secondary | ICD-10-CM | POA: Diagnosis not present

## 2017-04-30 DIAGNOSIS — E785 Hyperlipidemia, unspecified: Secondary | ICD-10-CM | POA: Diagnosis not present

## 2017-04-30 DIAGNOSIS — M25561 Pain in right knee: Secondary | ICD-10-CM | POA: Diagnosis not present

## 2017-04-30 NOTE — Progress Notes (Signed)
Cardiology Office Note:    Date:  04/30/2017   ID:  Spencer Cochran, DOB [birth year], MRN [id number]  PCP:  Maryella Shivers, MD  Cardiologist:  Jenne Campus, MD    Referring MD: Maryella Shivers, MD   Chief Complaint  Patient presents with  . Follow-up  Doing well  History of Present Illness:    Spencer Cochran is a 72 y.o. male atrial fibrillation, coronary artery disease.  Comes today to the office for follow-up last time I saw him it was before his right knee replacement surgery.  Evaluation was done before that he was fine to proceed.  Surgery went uneventful he is doing well recovering back to exercise is back to [facility] back to swimming doing well denies have any cardiac complaints no shortness of breath chest pain tightness squeezing pressure burning chest.  Past Medical History:  Diagnosis Date  . Arthritis knees and ankle  . Asymptomatic gallstones   . Cataract immature BILATERAL EYES  . Coronary artery disease CARDIOLOGIST-  DR Agustin Cree  Tia Alert)---  LAST VISIT 04-29-2011   DENIES CARDIAC SYMPTOMS  . DDD (degenerative disc disease), lumbar   . Dysrhythmia    A Fib   . Frequency of urination   . GERD (gastroesophageal reflux disease)   . Glaucoma   . Heart murmur   . Hypertension   . 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 Left ventricular diastolic dysfunction PER ECHO 03-31-2011  W/ CHART  . Nocturia   . OSA on CPAP   . Renal stones bilateral   no symptoms at this time  . Urethral stricture     Past Surgical History:  Procedure Laterality Date  . APPENDECTOMY  1972  . CARDIOVASCULAR STRESS TEST  04-29-2010   DR KRASAWSKI   NO EVIDENCE ISCHEMIA/ NORMAL LVSF AND WALL MOTION/ EF 63%  . CERVICAL FUSION  2006   C3 - 6  . colonscopy     . CYSTO/ URETHRAL DILATION/ TRANSURETHRAL INCISIONOF PROSTATE  01-13-2007  . CYSTOSCOPY  2005  . CYSTOSCOPY W/ RETROGRADES  06/12/2011   Procedure: CYSTOSCOPY WITH RETROGRADE PYELOGRAM;   Surgeon: Ailene Rud, MD;  Location: [facility];  Service: Urology;  Laterality: N/A;  cysto, urethral dilation, right retrograde pyelogram    . CYSTOSCOPY WITH RETROGRADE PYELOGRAM, URETEROSCOPY AND STENT PLACEMENT Left 03/08/2014   Procedure: CYSTOSCOPY WITH LEFT RETROGRADE PYELOGRAM/LEFT  URETEROSCOPY;  Surgeon: Ailene Rud, MD;  Location: [facility];  Service: Urology;  Laterality: Left;  . CYSTOSCOPY WITH URETHRAL DILATATION N/A 04/20/2013   Procedure: CYSTOSCOPY WITH URETHRAL DILATATION;  Surgeon: Ailene Rud, MD;  Location: [facility];  Service: Urology;  Laterality: N/A;  . EXTRACORPOREAL SHOCK WAVE LITHOTRIPSY  01-17-2007   LEFT  . HOLMIUM LASER APPLICATION Left 07/06/8935   Procedure: LASER OF LEFT RENAL PELVIC STONE;  Surgeon: Ailene Rud, MD;  Location: [facility];  Service: Urology;  Laterality: Left;  . INGUINAL HERNIA REPAIR  2003  . LEFT ACHILLES TENDON REPAIR  1992  . NASAL SINUS SURGERY  2012  . PENILE SURGERY  OF MEATUS  1955  . TRANSTHORACIC ECHOCARDIOGRAM  03-31-2011   NORMAL LVEF  59%/ TRIVIAL MR/ DIASTOLIC DYSFUNCTION/ MODERATE LVH  . TRANSURETHRAL RESECTION OF PROSTATE N/A 04/20/2013   Procedure: TRANSURETHRAL RESECTION OF THE PROSTATE WITH GYRUS INSTRUMENTS;  Surgeon: Ailene Rud, MD;  Location: [facility];  Service: Urology;  Laterality: N/A;    Current Medications: Current Meds  Medication  Sig  . amLODipine-benazepril (LOTREL) 5-40 MG capsule TAKE 1 CAPSULE DAILY  . apixaban (ELIQUIS) 5 MG TABS tablet Take 5 mg by mouth 2 (two) times daily.  Marland Kitchen atorvastatin (LIPITOR) 40 MG tablet Take 40 mg by mouth at bedtime.   . carvedilol (COREG) 25 MG tablet Take 25 mg by mouth 2 (two) times daily with a meal.  . cholecalciferol (VITAMIN D) 1000 UNITS tablet Take 2,000 Units by mouth daily.  . ferrous sulfate 325 (65 FE) MG EC tablet Take 325 mg by mouth daily with breakfast.  . fish oil-omega-3 fatty acids 1000 MG capsule Take 1 g by  mouth daily.   . fluticasone (FLONASE) 50 MCG/ACT nasal spray Place 1 spray into both nostrils daily as needed for allergies or rhinitis.  . furosemide (LASIX) 40 MG tablet TAKE 1 TABLET EVERY OTHER DAY  . nabumetone (RELAFEN) 500 MG tablet Take 500 mg by mouth 2 (two) times daily as needed for mild pain or moderate pain.  . nitroGLYCERIN (NITROSTAT) 0.4 MG SL tablet Place 1 tablet (0.4 mg total) under the tongue every 5 (five) minutes as needed for chest pain.  Marland Kitchen omeprazole (PRILOSEC) 20 MG capsule Take 20 mg by mouth every morning.     Allergies:   Hydrocodone   Social History   Socioeconomic History  . Marital status: Married    Spouse name: Not on file  . Number of children: Not on file  . Years of education: Not on file  . Highest education level: Not on file  Occupational History  . Not on file  Social Needs  . Financial resource strain: Not on file  . Food insecurity:    Worry: Not on file    Inability: Not on file  . Transportation needs:    Medical: Not on file    Non-medical: Not on file  Tobacco Use  . Smoking status: Never Smoker  . Smokeless tobacco: Never Used  Substance and Sexual Activity  . Alcohol use: No  . Drug use: No  . Sexual activity: Yes  Lifestyle  . Physical activity:    Days per week: Not on file    Minutes per session: Not on file  . Stress: Not on file  Relationships  . Social connections:    Talks on phone: Not on file    Gets together: Not on file    Attends religious service: Not on file    Active member of club or organization: Not on file    Attends meetings of clubs or organizations: Not on file    Relationship status: Not on file  Other Topics Concern  . Not on file  Social History Narrative  . Not on file     Family History: The patient's family history includes Breast cancer in his mother; Cirrhosis in his father. ROS:   Please see the history of present illness.    All 14 point review of systems negative except as  described per history of present illness  EKGs/Labs/Other Studies Reviewed:      Recent Labs: No results found for requested labs within last 8760 hours.  Recent Lipid Panel No results found for: CHOL, TRIG, HDL, CHOLHDL, VLDL, LDLCALC, LDLDIRECT  Physical Exam:    VS:  BP 120/70   Pulse 96   Ht 5\' 9"  (1.753 m)   Wt 240 lb (108.9 kg)   SpO2 94%   BMI 35.44 kg/m     Wt Readings from Last 3 Encounters:  04/30/17 240  lb (108.9 kg)  01/01/17 250 lb (113.4 kg)  10/27/16 243 lb 12.8 oz (110.6 kg)     GEN:  Well nourished, well developed in no acute distress HEENT: Normal NECK: No JVD; No carotid bruits LYMPHATICS: No lymphadenopathy CARDIAC: RRR, no murmurs, no rubs, no gallops RESPIRATORY:  Clear to auscultation without rales, wheezing or rhonchi  ABDOMEN: Soft, non-tender, non-distended MUSCULOSKELETAL:  No edema; No deformity  SKIN: Warm and dry LOWER EXTREMITIES: no swelling NEUROLOGIC:  Alert and oriented x 3 PSYCHIATRIC:  Normal affect   ASSESSMENT:    1. Chronic atrial fibrillation ([place])   2. Coronary artery disease involving native coronary artery of native heart without angina pectoris   3. Dyslipidemia    PLAN:    In order of problems listed above:  1. Chronic atrial fibrillation: Rate controlled, he is anticoagulated which I will continue. 2. Coronary artery disease: Stable and appropriate medications which I will continue. 3. Dyslipidemia on high intensity statin which I will continue.  Will call primary care physician to get his fasting lipid profile.     Medication Adjustments/Labs and Tests Ordered: Current medicines are reviewed at length with the patient today.  Concerns regarding medicines are outlined above.  No orders of the defined types were placed in this encounter.  Medication changes: No orders of the defined types were placed in this encounter.   Signed, Park Liter, MD, [facility], [place] 04/30/2017 10:36 AM    [place]

## 2017-04-30 NOTE — Patient Instructions (Signed)
Medication Instructions:  Your physician recommends that you continue on your current medications as directed. Please refer to the Current Medication list given to you today.  Labwork: None  Testing/Procedures: None  Follow-Up: Your physician wants you to follow-up in: 5 months. You will receive a reminder letter in the mail two months in advance. If you don't receive a letter, please call our office to schedule the follow-up appointment.  Any Other Special Instructions Will Be Listed Below (If Applicable).     If you need a refill on your cardiac medications before your next appointment, please call your pharmacy.   

## 2017-05-03 DIAGNOSIS — M25561 Pain in right knee: Secondary | ICD-10-CM | POA: Diagnosis not present

## 2017-05-03 DIAGNOSIS — Z4789 Encounter for other orthopedic aftercare: Secondary | ICD-10-CM | POA: Diagnosis not present

## 2017-05-03 DIAGNOSIS — R262 Difficulty in walking, not elsewhere classified: Secondary | ICD-10-CM | POA: Diagnosis not present

## 2017-05-05 DIAGNOSIS — N200 Calculus of kidney: Secondary | ICD-10-CM | POA: Diagnosis not present

## 2017-05-05 DIAGNOSIS — R310 Gross hematuria: Secondary | ICD-10-CM | POA: Diagnosis not present

## 2017-05-10 DIAGNOSIS — Z4789 Encounter for other orthopedic aftercare: Secondary | ICD-10-CM | POA: Diagnosis not present

## 2017-05-10 DIAGNOSIS — R262 Difficulty in walking, not elsewhere classified: Secondary | ICD-10-CM | POA: Diagnosis not present

## 2017-05-10 DIAGNOSIS — M25561 Pain in right knee: Secondary | ICD-10-CM | POA: Diagnosis not present

## 2017-05-11 DIAGNOSIS — N200 Calculus of kidney: Secondary | ICD-10-CM | POA: Diagnosis not present

## 2017-05-11 DIAGNOSIS — R8271 Bacteriuria: Secondary | ICD-10-CM | POA: Diagnosis not present

## 2017-05-11 DIAGNOSIS — R310 Gross hematuria: Secondary | ICD-10-CM | POA: Diagnosis not present

## 2017-05-15 ENCOUNTER — Other Ambulatory Visit: Payer: Self-pay | Admitting: Cardiology

## 2017-05-17 DIAGNOSIS — Z4789 Encounter for other orthopedic aftercare: Secondary | ICD-10-CM | POA: Diagnosis not present

## 2017-05-17 DIAGNOSIS — M25561 Pain in right knee: Secondary | ICD-10-CM | POA: Diagnosis not present

## 2017-05-17 DIAGNOSIS — R262 Difficulty in walking, not elsewhere classified: Secondary | ICD-10-CM | POA: Diagnosis not present

## 2017-05-18 ENCOUNTER — Other Ambulatory Visit: Payer: Self-pay | Admitting: *Deleted

## 2017-05-18 ENCOUNTER — Other Ambulatory Visit: Payer: Self-pay | Admitting: Urology

## 2017-05-18 MED ORDER — APIXABAN 5 MG PO TABS: 5.0000 mg | ORAL_TABLET | Freq: Two times a day (BID) | ORAL | 3 refills | Status: DC

## 2017-05-18 NOTE — Telephone Encounter (Signed)
[facility]

## 2017-05-19 DIAGNOSIS — M25561 Pain in right knee: Secondary | ICD-10-CM | POA: Diagnosis not present

## 2017-05-24 ENCOUNTER — Other Ambulatory Visit: Payer: Self-pay

## 2017-05-24 DIAGNOSIS — M25561 Pain in right knee: Secondary | ICD-10-CM | POA: Diagnosis not present

## 2017-05-24 DIAGNOSIS — Z4789 Encounter for other orthopedic aftercare: Secondary | ICD-10-CM | POA: Diagnosis not present

## 2017-05-24 DIAGNOSIS — R262 Difficulty in walking, not elsewhere classified: Secondary | ICD-10-CM | POA: Diagnosis not present

## 2017-05-24 MED ORDER — NITROGLYCERIN 0.4 MG SL SUBL: 0.4000 mg | SUBLINGUAL_TABLET | SUBLINGUAL | 1 refills | Status: DC | PRN

## 2017-05-31 DIAGNOSIS — R262 Difficulty in walking, not elsewhere classified: Secondary | ICD-10-CM | POA: Diagnosis not present

## 2017-05-31 DIAGNOSIS — Z4789 Encounter for other orthopedic aftercare: Secondary | ICD-10-CM | POA: Diagnosis not present

## 2017-05-31 DIAGNOSIS — M25561 Pain in right knee: Secondary | ICD-10-CM | POA: Diagnosis not present

## 2017-06-01 ENCOUNTER — Ambulatory Visit: Payer: Medicare Other | Admitting: Gastroenterology

## 2017-06-02 ENCOUNTER — Encounter (HOSPITAL_COMMUNITY): Payer: Self-pay

## 2017-06-02 NOTE — Patient Instructions (Addendum)
Spencer Cochran  06/02/2017   Your procedure is scheduled on: 06-10-17   Report to [facility] Main  Entrance              Report to admitting at       215 PM    Call this number if you have problems the morning of surgery [phone]   Remember: Do not eat food  :After Midnight. YOU MAY HAVE CLEAR LIQUIDS UNTIL  1015 am  THEN NOTHING BY MOUTH     Take these medicines the morning of surgery with A SIP OF WATER: eye drops as usual, omeprazole, finasteride, carvedilol                                You may not have any metal on your body including hair pins and              piercings  Do not wear jewelry,  lotions, powders or perfumes, deodorant              Men may shave face and neck.   Do not bring valuables to the hospital. [place].  Contacts, dentures or bridgework may not be worn into surgery.     Patients discharged the day of surgery will not be allowed to drive home.  Name and phone number of your driver:  Special Instructions: N/A              Please read over the following fact sheets you were given: _____________________________________________________________________          [facility] - Preparing for Surgery Before surgery, you can play an important role.  Because skin is not sterile, your skin needs to be as free of germs as possible.  You can reduce the number of germs on your skin by washing with CHG (chlorahexidine gluconate) soap before surgery.  CHG is an antiseptic cleaner which kills germs and bonds with the skin to continue killing germs even after washing. Please DO NOT use if you have an allergy to CHG or antibacterial soaps.  If your skin becomes reddened/irritated stop using the CHG and inform your nurse when you arrive at Short Stay. Do not shave (including legs and underarms) for at least 48 hours prior to the first CHG shower.  You may shave your face/neck. Please follow these  instructions carefully:  1.  Shower with CHG Soap the night before surgery and the  morning of Surgery.  2.  If you choose to wash your hair, wash your hair first as usual with your  normal  shampoo.  3.  After you shampoo, rinse your hair and body thoroughly to remove the  shampoo.                           4.  Use CHG as you would any other liquid soap.  You can apply chg directly  to the skin and wash                       Gently with a scrungie or clean washcloth.  5.  Apply the CHG Soap to your body ONLY FROM THE NECK DOWN.  Do not use on face/ open                           Wound or open sores. Avoid contact with eyes, ears mouth and genitals (private parts).                       Wash face,  Genitals (private parts) with your normal soap.             6.  Wash thoroughly, paying special attention to the area where your surgery  will be performed.  7.  Thoroughly rinse your body with warm water from the neck down.  8.  DO NOT shower/wash with your normal soap after using and rinsing off  the CHG Soap.                9.  Pat yourself dry with a clean towel.            10.  Wear clean pajamas.            11.  Place clean sheets on your bed the night of your first shower and do not  sleep with pets. Day of Surgery : Do not apply any lotions/deodorants the morning of surgery.  Please wear clean clothes to the hospital/surgery center.  FAILURE TO FOLLOW THESE INSTRUCTIONS MAY RESULT IN THE CANCELLATION OF YOUR SURGERY PATIENT SIGNATURE_________________________________  NURSE SIGNATURE__________________________________  ________________________________________________________________________    CLEAR LIQUID DIET   Foods Allowed                                                                     Foods Excluded  Coffee and tea, regular and decaf                             liquids that you cannot  Plain Jell-O in any flavor                                             see through such  as: Fruit ices (not with fruit pulp)                                     milk, soups, orange juice  Iced Popsicles                                    All solid food Carbonated beverages, regular and diet                                    Cranberry, grape and apple juices Sports drinks like Gatorade Lightly seasoned clear broth or consume(fat free) Sugar, honey syrup  Sample Menu Breakfast  Lunch                                     Supper Cranberry juice                    Beef broth                            Chicken broth Jell-O                                     Grape juice                           Apple juice Coffee or tea                        Jell-O                                      Popsicle                                                Coffee or tea                        Coffee or tea  _____________________________________________________________________

## 2017-06-04 ENCOUNTER — Encounter (HOSPITAL_COMMUNITY)
Admission: RE | Admit: 2017-06-04 | Discharge: 2017-06-04 | Disposition: A | Discharge status: Home / Self Care | Payer: Medicare Other | Source: Ambulatory Visit | Attending: Urology | Admitting: Urology

## 2017-06-04 ENCOUNTER — Encounter (HOSPITAL_COMMUNITY): Payer: Self-pay

## 2017-06-04 ENCOUNTER — Other Ambulatory Visit: Payer: Self-pay

## 2017-06-04 DIAGNOSIS — Z01812 Encounter for preprocedural laboratory examination: Secondary | ICD-10-CM | POA: Insufficient documentation

## 2017-06-04 DIAGNOSIS — N200 Calculus of kidney: Secondary | ICD-10-CM | POA: Insufficient documentation

## 2017-06-04 HISTORY — DX: Prediabetes: R73.03

## 2017-06-04 HISTORY — DX: Malignant (primary) neoplasm, unspecified: C80.1

## 2017-06-04 HISTORY — DX: Personal history of urinary calculi: Z87.442

## 2017-06-04 LAB — BASIC METABOLIC PANEL
ANION GAP: 6 (ref 5–15)
BUN: 19 mg/dL (ref 6–20)
CO2: 28 mmol/L (ref 22–32)
Calcium: 8.9 mg/dL (ref 8.9–10.3)
Chloride: 107 mmol/L (ref 101–111)
Creatinine, Ser: 0.77 mg/dL (ref 0.61–1.24)
GFR calc Af Amer: >60 mL/min (ref >60)
GFR calc non Af Amer: >60 mL/min (ref >60)
GLUCOSE: 102 mg/dL — AB (ref 65–99)
Potassium: 3.9 mmol/L (ref 3.5–5.1)
Sodium: 141 mmol/L (ref 135–145)

## 2017-06-04 LAB — CBC
HEMATOCRIT: 42.7 % (ref 39.0–52.0)
HEMOGLOBIN: 14.3 g/dL (ref 13.0–17.0)
MCH: 32.9 pg (ref 26.0–34.0)
MCHC: 33.5 g/dL (ref 30.0–36.0)
MCV: 98.4 fL (ref 78.0–100.0)
Platelets: 189 10*3/uL (ref 150–400)
RBC: 4.34 MIL/uL (ref 4.22–5.81)
RDW: 12.3 % (ref 11.5–15.5)
WBC: 7.2 10*3/uL (ref 4.0–10.5)

## 2017-06-04 LAB — HEMOGLOBIN A1C
Hgb A1c MFr Bld: 5.6 % (ref 4.8–5.6)
MEAN PLASMA GLUCOSE: 114.02 mg/dL

## 2017-06-04 NOTE — Progress Notes (Signed)
lov cards 04-30-17 epic  ekg 10-01-16 epic  Stress 07-2016 epic cxr 01-07-17 epic

## 2017-06-07 DIAGNOSIS — I10 Essential (primary) hypertension: Secondary | ICD-10-CM | POA: Diagnosis not present

## 2017-06-07 DIAGNOSIS — Z13220 Encounter for screening for lipoid disorders: Secondary | ICD-10-CM | POA: Diagnosis not present

## 2017-06-07 DIAGNOSIS — R7301 Impaired fasting glucose: Secondary | ICD-10-CM | POA: Diagnosis not present

## 2017-06-09 NOTE — H&P (Signed)
Office Visit Report     05/11/2017   --------------------------------------------------------------------------------   Spencer Cochran  MRN: [id number]  PRIMARY CARE:  Inez Pilgrim. Nyra Capes, MD  DOB: [birth year], 72 year old Male  REFERRING:  Ammie Dalton, NP  SSN: -[id number]  PROVIDER:  Carolan Clines, M.D.    TREATING:  Raynelle Bring, M.D.    LOCATION:  [facility] [phone]   --------------------------------------------------------------------------------   CC/HPI: 1. Gross hematuria  2. Left UPJ calculus and right renal calculus  3. Chronic bacteruria   Spencer Cochran (pronounced strite) returns today after having had intermittent left-sided flank pain along with gross hematuria. He does take Aleve for arthritis and also takes Eliquis for atrial fibrillation. He has noticed that he will have gross hematuria intermittently and this typically does coincide with his flank pain symptoms. He has denied any fever, nausea/vomiting, or other complaints. He has not had any difficulties emptying his bladder. He recently underwent an evaluation including a CT scan of the abdomen and pelvis. This did confirm 3 nonobstructing left renal calculi with the largest measuring 9 mm in the vicinity of the renal pelvis. There are also multiple small right renal calculi with 1 7 mm right lower pole calculus. No hydronephrosis was noted. Imaging of the bladder did reveal some slight bladder thickening without other concerning features. He presents today for further evaluation of his hematuria.     ALLERGIES: Hydrocodone-Acetaminophen CAPS    MEDICATIONS: Finasteride 5 mg tablet 1 tablet PO Daily  Tamsulosin Hcl 0.4 mg capsule 1 capsule PO Q HS  Amlodipine Besylate  Atorvastatin Calcium  Carvedilol 25 mg tablet 0 Oral Twice daily  Eliquis  Fish Oil CAPS Oral  Furosemide 40 MG Oral Tablet Oral  Iron 236 mg (27 mg iron) tablet Oral  Nabumetone  Omeprazole 20 mg capsule,delayed  release Oral  Viagra 100 mg tablet 1 Oral  Vitamin D 50,000 unit capsule 0 Oral     GU PSH: Cysto Dilate Stricture (M or F) - 2013 Cysto Uretero Lithotripsy - 2016 Cystoscopy - 2008 Cystoscopy Insert Stent - 2016 Cystoscopy TUIP - 2008 Cystoscopy TURP - 2015 ESWL - 2009 Initial Male VB Sounds - 2015 Locm 300-399Mg /Ml Iodine,1Ml - 05/05/2017, 10/12/2016      PSH Notes: Cystoscopy With Ureteroscopy With Lithotripsy, Cystoscopy With Insertion Of Ureteral Stent Left, Dilat Male Ureth Strict By Arlyce Dice Dilat Initial, Transurethral Resection Of Prostate (TURP), Cystoscopy For Urethral Stricture, Lithotripsy, Cystoscopy (Diagnostic), Urethra Surgery, Transurethral Incision Of Prostate, Primary Repair Of Ruptured Achilles Tendon, Inguinal Hernia Repair, Appendectomy   NON-GU PSH: Appendectomy - 2007 Repair Achilles Tendon - 2008    GU PMH: Gross hematuria - 04/28/2017, Gross hematuria, - 2017 Renal calculus (Stable) - 12/16/2016, Bilateral kidney stones, - 01/04/2015 Weak Urinary Stream - 12/16/2016 BPH w/LUTS, Benign localized hyperplasia of prostate with urinary obstruction - 2016 ED due to arterial insufficiency, Erectile dysfunction due to arterial insufficiency - 2016 Primary hypogonadism, Hypogonadism, testicular - 2016 Urethral Stricture, Unspec, Urethral stricture - 2015 Elevated PSA, Elevated prostate specific antigen (PSA) - 2014      PMH Notes:   1) BPH/LUTS: He is s/p a TURP by Dr. Gaynelle Arabian.   Mar 2015: TURP, pathology benign   2) Hematuria: He has had recurrent gross hematuria since his TURP. He has been anticoagulated for atrial fibrillation with Eliquis. It has been felt that his prostate has been to most likely source of his hematuria vs. renal stones. He does not smoke.  Jan 2016: CT - B renal stones  Feb 2016: Cystoscopy - Negative  Jan 2017: CT- B renal stones  Feb 2017: Cystoscopy - Negative  Sep 2018: CT - Bilateral non-obstructing renal calculi   Mar 2019: CT - Bilateral renal calculi with left renal pelvic stone (9 mm)   3) Urolithiasis: He has a history of urolithiasis.   2016: Ureteroscopy by Dr. Gaynelle Arabian   4) Testosterone deficiency: His symptoms include erectile dysfunction and decreased libido.   5) Bacteruria: He has had chronic bacteruria with Streptococcus viridans.     NON-GU PMH: Decreased libido, Decreased libido - 2014 Hypercholesterolemia Hypertension Sleep Apnea    FAMILY HISTORY: Aneurysm Of Abdominal Aorta - Father Bladder Cancer - Father Family Health Status Number - Runs In Family renal failure - Father Urologic Disorder - Father   SOCIAL HISTORY: Marital Status: Married Preferred Language: English; Ethnicity: Not Hispanic Or Latino; Race: White Has never drank.  Drinks 1 caffeinated drink per day.     Notes: Retired, Never A Smoker, Caffeine Use, Alcohol Use, Death In The Family Father, Death In The Family Mother, Tobacco Use, Marital History - Currently Married, Occupation:   REVIEW OF SYSTEMS:    GU Review Male:   Patient reports get up at night to urinate. Patient denies frequent urination, hard to postpone urination, burning/ pain with urination, leakage of urine, stream starts and stops, trouble starting your streams, and have to strain to urinate .  Gastrointestinal (Upper):   Patient denies nausea and vomiting.  Gastrointestinal (Lower):   Patient denies diarrhea and constipation.  Constitutional:   Patient denies fever, night sweats, weight loss, and fatigue.  Skin:   Patient denies skin rash/ lesion and itching.  Eyes:   Patient denies blurred vision and double vision.  Ears/ Nose/ Throat:   Patient denies sore throat and sinus problems.  Hematologic/Lymphatic:   Patient denies easy bruising and swollen glands.  Cardiovascular:   Patient denies leg swelling and chest pains.  Respiratory:   Patient denies cough and shortness of breath.  Endocrine:   Patient denies excessive thirst.   Musculoskeletal:   Patient reports joint pain. Patient denies back pain.  Neurological:   Patient denies headaches and dizziness.  Psychologic:   Patient denies depression and anxiety.   VITAL SIGNS:      05/11/2017 09:25 AM  Weight 240 lb / 108.86 kg  Height 69 in / 175.26 cm  BP 112/75 mmHg  Pulse 94 /min  BMI 35.4 kg/m   MULTI-SYSTEM PHYSICAL EXAMINATION:    Constitutional: Well-nourished. No physical deformities. Normally developed. Good grooming.  Respiratory: Clear bilaterally  Cardiovascular: Regular rate and rhythm. No murmur, no gallop. Normal temperature, normal extremity pulses, no swelling, no varicosities.  Gastrointestinal: Mild left CVA tenderness.     PAST DATA REVIEWED:  Source Of History:  Patient  Urine Test Review:   Urinalysis  X-Ray Review: C.T. Abdomen/Pelvis: Reviewed Films.     02/21/15 08/28/14 02/10/13 04/03/11 12/18/09 11/15/08 09/16/07 02/11/06  PSA  Total PSA 0.59  0.72  4.18  3.97  2.90  3.20  0.26  3.83   Free PSA  0.23  1.18        % Free PSA  32  28          08/27/14 02/11/13 10/14/11 06/02/11 04/03/11 12/18/09 11/15/08 04/04/08  Hormones  Testosterone, Total 466  425  474.93  298.91  368.19  277.39  451.0  266.0    Notes:  I independently reviewed his CT scan. Findings are as dictated above.   PROCEDURES:         Flexible Cystoscopy - 52000  Indication: Hematuria Risks, benefits, and potential complications of the procedure were discussed with the patient including infection, bleeding, voiding discomfort, urinary retention, fever, chills, sepsis, and others. All questions were answered. Informed consent was obtained. Sterile technique and intraurethral analgesia were used.  Meatus:  Normal size. Normal location. Normal condition.  Urethra:  No strictures.  External Sphincter:  Normal.  Verumontanum:  Normal.  Prostate:  Prior TURP defect noted. No active bleeding sites from the prostatic urethra. No significant  obstructing prostatic tissue.  Bladder Neck:  Non-obstructing.  Ureteral Orifices:  Normal location. Normal size. Normal shape. Effluxed clear urine.  Bladder:  Visualization was somewhat obscured due to blood in the urine. However, no definite bladder tumors, stones, or other mucosal abnormalities were identified.      Chaperone: AJ The procedure was well-tolerated and without complications. Instructions were given to call the office immediately if questions or problems.         Urinalysis Dipstick Dipstick Cont'd Micro  Color: Brown Bilirubin: Neg WBC/hpf: 40 - 60/hpf  Appearance: Turbid Ketones: Trace RBC/hpf: >60/hpf  Specific Gravity: 1.020 Blood: 3+ Bacteria: NS (Not Seen)  pH: 6.0 Protein: 3+ Cystals: NS (Not Seen)  Glucose: Neg Urobilinogen: 0.2 Casts: NS (Not Seen)    Nitrites: Neg Trichomonas: Not Present    Leukocyte Esterase: 3+ Mucous: Not Present      Epithelial Cells: NS (Not Seen)      Yeast: NS (Not Seen)      Sperm: Not Present    Notes: microscopic done on unconcentrated specimen    ASSESSMENT:      ICD-10 Details  1 GU:   Gross hematuria - R31.0   2   Renal calculus - N20.0   3 NON-GU:   Bacteriuria - R82.71    PLAN:            Medications New Meds: Oxycodone-Acetaminophen 5 mg-325 mg tablet 1-2 tablet PO Q 6 H prn   #20  0 Refill(s)            Orders Labs Urine Culture          Schedule Return Visit/Planned Activity: Other See Visit Notes             Note: Will call to schedule surgery          Document Letter(s):  Created for Patient: Clinical Summary         Notes:   1. Gross hematuria: He has multiple potential causes for his hematuria. Most likely would be active stone disease and the fact that he is also on chronic anticoagulation. He has also had chronic bacteruria and has a history of BPH. Currently, we have agreed to treat his left renal calculi as the most likely source to see if this would improve.   2. Left renal calculi: His 9  mm calculus in the renal pelvis is likely intermittently symptomatic based on his history. It also may be the source for his hematuria as his hematuria typically coincides with left flank pain. We therefore agreed to proceed with treatment. I did review the pros and cons of options for treatment including shockwave lithotripsy versus ureteroscopic laser lithotripsy. After reviewing the pros and cons of these approaches, he adamantly does wish to proceed with ureteroscopic laser lithotripsy due to the fact that it is likely more definitive  with higher success rates with initial treatment. We did review the potential risks, complications, and the expected recovery process as well as the need for a postoperative ureteral stent. This will be scheduled for the near future. He will stop his Eliquis 48 hours prior to his surgery. He also knows that he must stop his Aleve and fish oil. His urine will be cultured today.   3. Right renal calculi: These are currently asymptomatic.   4. BPH/LUTS: Continue finasteride 5 mg daily.   CC: Dr. Maryella Shivers         Next Appointment:      Next Appointment: 12/22/2017 01:15 PM    Appointment Type: KUB    Location: [facility] [phone]    Provider: KUB KUB    Reason for Visit: 1 yr ck/kub/Bart Ashford      * Signed by Raynelle Bring, M.D. on 05/11/17 at 3:11 PM (EDT)*

## 2017-06-10 ENCOUNTER — Encounter (HOSPITAL_COMMUNITY)
Admission: RE | Disposition: A | Discharge status: Home / Self Care | Payer: Self-pay | Source: Ambulatory Visit | Attending: Urology

## 2017-06-10 ENCOUNTER — Ambulatory Visit (HOSPITAL_COMMUNITY)
Admission: RE | Admit: 2017-06-10 | Discharge: 2017-06-10 | Disposition: A | Discharge status: Home / Self Care | Payer: Medicare Other | Source: Ambulatory Visit | Attending: Urology | Admitting: Urology

## 2017-06-10 ENCOUNTER — Ambulatory Visit (HOSPITAL_COMMUNITY): Payer: Medicare Other | Admitting: Certified Registered Nurse Anesthetist

## 2017-06-10 ENCOUNTER — Encounter (HOSPITAL_COMMUNITY): Payer: Self-pay | Admitting: Emergency Medicine

## 2017-06-10 ENCOUNTER — Ambulatory Visit (HOSPITAL_COMMUNITY): Payer: Medicare Other

## 2017-06-10 DIAGNOSIS — E785 Hyperlipidemia, unspecified: Secondary | ICD-10-CM | POA: Diagnosis not present

## 2017-06-10 DIAGNOSIS — I2510 Atherosclerotic heart disease of native coronary artery without angina pectoris: Secondary | ICD-10-CM | POA: Insufficient documentation

## 2017-06-10 DIAGNOSIS — Z79899 Other long term (current) drug therapy: Secondary | ICD-10-CM | POA: Diagnosis not present

## 2017-06-10 DIAGNOSIS — N201 Calculus of ureter: Secondary | ICD-10-CM | POA: Diagnosis not present

## 2017-06-10 DIAGNOSIS — I4891 Unspecified atrial fibrillation: Secondary | ICD-10-CM | POA: Insufficient documentation

## 2017-06-10 DIAGNOSIS — R6882 Decreased libido: Secondary | ICD-10-CM | POA: Diagnosis not present

## 2017-06-10 DIAGNOSIS — K219 Gastro-esophageal reflux disease without esophagitis: Secondary | ICD-10-CM | POA: Insufficient documentation

## 2017-06-10 DIAGNOSIS — G4730 Sleep apnea, unspecified: Secondary | ICD-10-CM | POA: Diagnosis not present

## 2017-06-10 DIAGNOSIS — E7800 Pure hypercholesterolemia, unspecified: Secondary | ICD-10-CM | POA: Insufficient documentation

## 2017-06-10 DIAGNOSIS — I10 Essential (primary) hypertension: Secondary | ICD-10-CM | POA: Diagnosis not present

## 2017-06-10 DIAGNOSIS — N200 Calculus of kidney: Secondary | ICD-10-CM | POA: Diagnosis not present

## 2017-06-10 DIAGNOSIS — Z7902 Long term (current) use of antithrombotics/antiplatelets: Secondary | ICD-10-CM | POA: Diagnosis not present

## 2017-06-10 HISTORY — PX: CYSTOSCOPY/URETEROSCOPY/HOLMIUM LASER/STENT PLACEMENT: SHX6546

## 2017-06-10 SURGERY — CYSTOSCOPY/URETEROSCOPY/HOLMIUM LASER/STENT PLACEMENT
Anesthesia: General | Laterality: Left

## 2017-06-10 MED ORDER — FENTANYL CITRATE (PF) 100 MCG/2ML IJ SOLN
INTRAMUSCULAR | Status: DC | PRN
  Administered 2017-06-10: 100 ug via INTRAVENOUS

## 2017-06-10 MED ORDER — LIDOCAINE 2% (20 MG/ML) 5 ML SYRINGE
INTRAMUSCULAR | Status: DC | PRN
  Administered 2017-06-10: 80 mg via INTRAVENOUS

## 2017-06-10 MED ORDER — PROPOFOL 10 MG/ML IV BOLUS
INTRAVENOUS | Status: AC
  Filled 2017-06-10: qty 20

## 2017-06-10 MED ORDER — SODIUM CHLORIDE 0.9 % IV SOLN
2.0000 g | Freq: Once | INTRAVENOUS | Status: AC
  Administered 2017-06-10: 2 g via INTRAVENOUS
  Filled 2017-06-10: qty 20

## 2017-06-10 MED ORDER — FENTANYL CITRATE (PF) 100 MCG/2ML IJ SOLN: 25.0000 ug | INTRAMUSCULAR | Status: DC | PRN

## 2017-06-10 MED ORDER — PROPOFOL 10 MG/ML IV BOLUS
INTRAVENOUS | Status: DC | PRN
  Administered 2017-06-10: 200 mg via INTRAVENOUS

## 2017-06-10 MED ORDER — LIDOCAINE 2% (20 MG/ML) 5 ML SYRINGE
INTRAMUSCULAR | Status: AC
  Filled 2017-06-10: qty 5

## 2017-06-10 MED ORDER — PHENYLEPHRINE 40 MCG/ML (10ML) SYRINGE FOR IV PUSH (FOR BLOOD PRESSURE SUPPORT)
PREFILLED_SYRINGE | INTRAVENOUS | Status: DC | PRN
  Administered 2017-06-10 (×10): 80 ug via INTRAVENOUS

## 2017-06-10 MED ORDER — EPHEDRINE SULFATE-NACL 50-0.9 MG/10ML-% IV SOSY
PREFILLED_SYRINGE | INTRAVENOUS | Status: DC | PRN
  Administered 2017-06-10: 5 mg via INTRAVENOUS

## 2017-06-10 MED ORDER — LACTATED RINGERS IV SOLN
INTRAVENOUS | Status: DC
  Administered 2017-06-10: 14:00:00 via INTRAVENOUS

## 2017-06-10 MED ORDER — ONDANSETRON HCL 4 MG/2ML IJ SOLN
INTRAMUSCULAR | Status: DC | PRN
  Administered 2017-06-10: 4 mg via INTRAVENOUS

## 2017-06-10 MED ORDER — PHENYLEPHRINE 40 MCG/ML (10ML) SYRINGE FOR IV PUSH (FOR BLOOD PRESSURE SUPPORT)
PREFILLED_SYRINGE | INTRAVENOUS | Status: AC
  Filled 2017-06-10: qty 40

## 2017-06-10 MED ORDER — OXYCODONE-ACETAMINOPHEN 5-325 MG PO TABS: 1.0000 | ORAL_TABLET | Freq: Four times a day (QID) | ORAL | 0 refills | Status: DC | PRN

## 2017-06-10 MED ORDER — DEXAMETHASONE SODIUM PHOSPHATE 10 MG/ML IJ SOLN
INTRAMUSCULAR | Status: AC
  Filled 2017-06-10: qty 1

## 2017-06-10 MED ORDER — DEXAMETHASONE SODIUM PHOSPHATE 10 MG/ML IJ SOLN
INTRAMUSCULAR | Status: DC | PRN
  Administered 2017-06-10: 10 mg via INTRAVENOUS

## 2017-06-10 MED ORDER — IOHEXOL 300 MG/ML  SOLN
INTRAMUSCULAR | Status: DC | PRN
  Administered 2017-06-10: 10 mL via URETHRAL

## 2017-06-10 MED ORDER — EPHEDRINE 5 MG/ML INJ
INTRAVENOUS | Status: AC
  Filled 2017-06-10: qty 10

## 2017-06-10 MED ORDER — SODIUM CHLORIDE 0.9 % IR SOLN
Status: DC | PRN
  Administered 2017-06-10: 3000 mL via INTRAVESICAL

## 2017-06-10 MED ORDER — ONDANSETRON HCL 4 MG/2ML IJ SOLN
INTRAMUSCULAR | Status: AC
  Filled 2017-06-10: qty 2

## 2017-06-10 MED ORDER — FENTANYL CITRATE (PF) 100 MCG/2ML IJ SOLN
INTRAMUSCULAR | Status: AC
  Filled 2017-06-10: qty 2

## 2017-06-10 MED ORDER — AMOXICILLIN-POT CLAVULANATE 875-125 MG PO TABS: 1.0000 | ORAL_TABLET | Freq: Two times a day (BID) | ORAL | 0 refills | Status: DC

## 2017-06-10 SURGICAL SUPPLY — 20 items
BAG URO CATCHER STRL LF (MISCELLANEOUS) ×2 IMPLANT
BASKET ZERO TIP NITINOL 2.4FR (BASKET) ×2 IMPLANT
CATH INTERMIT  6FR 70CM (CATHETERS) ×2 IMPLANT
CLOTH BEACON ORANGE TIMEOUT ST (SAFETY) ×2 IMPLANT
COVER FOOTSWITCH UNIV (MISCELLANEOUS) IMPLANT
COVER SURGICAL LIGHT HANDLE (MISCELLANEOUS) IMPLANT
FIBER LASER FLEXIVA 365 (UROLOGICAL SUPPLIES) IMPLANT
FIBER LASER TRAC TIP (UROLOGICAL SUPPLIES) ×2 IMPLANT
GLOVE BIOGEL M STRL SZ7.5 (GLOVE) ×6 IMPLANT
GOWN STRL REUS W/TWL LRG LVL3 (GOWN DISPOSABLE) ×4 IMPLANT
GUIDEWIRE ANG ZIPWIRE 038X150 (WIRE) IMPLANT
GUIDEWIRE STR DUAL SENSOR (WIRE) ×2 IMPLANT
IV NS 1000ML (IV SOLUTION) ×1
IV NS 1000ML BAXH (IV SOLUTION) ×1 IMPLANT
MANIFOLD NEPTUNE II (INSTRUMENTS) ×2 IMPLANT
PACK CYSTO (CUSTOM PROCEDURE TRAY) ×2 IMPLANT
SHEATH URETERAL 12FRX35CM (MISCELLANEOUS) ×2 IMPLANT
STENT CONTOUR 6FRX26X.038 (STENTS) ×2 IMPLANT
TUBING CONNECTING 10 (TUBING) ×2 IMPLANT
TUBING UROLOGY SET (TUBING) ×2 IMPLANT

## 2017-06-10 NOTE — Progress Notes (Addendum)
Notified Dr. Alinda Money that pt lost prescription (oxycodone) from last visit and that he also forgot to take augmentin 5 days prior to surgery. Okay to proceed today per Dr. Alinda Money.

## 2017-06-10 NOTE — Transfer of Care (Signed)
Immediate Anesthesia Transfer of Care Note  Patient: Spencer Cochran  Procedure(s) Performed: CYSTOSCOPY/RETROGRADE/URETEROSCOPY/HOLMIUM LASER/STENT PLACEMENT (Left )  Patient Location: PACU  Anesthesia Type:General  Level of Consciousness: awake, alert , oriented and patient cooperative  Airway & Oxygen Therapy: Patient Spontanous Breathing and Patient connected to face mask oxygen  Post-op Assessment: Report given to RN, Post -op Vital signs reviewed and stable and Patient moving all extremities X 4  Post vital signs: Reviewed and stable  Last Vitals:  Vitals Value Taken Time  BP 132/86 06/10/2017  4:30 PM  Temp    Pulse 99 06/10/2017  4:30 PM  Resp 14 06/10/2017  4:30 PM  SpO2 94 % 06/10/2017  4:30 PM  Vitals shown include unvalidated device data.  Last Pain:  Vitals:   06/10/17 1344  TempSrc:   PainSc: 0-No pain      Patients Stated Pain Goal: 4 (37/10/62 6948)  Complications: No apparent anesthesia complications

## 2017-06-10 NOTE — Anesthesia Procedure Notes (Signed)
Procedure Name: LMA Insertion Date/Time: 06/10/2017 3:35 PM Performed by: Mitzie Na, CRNA Pre-anesthesia Checklist: Patient identified, Emergency Drugs available, Suction available and Patient being monitored Patient Re-evaluated:Patient Re-evaluated prior to induction Oxygen Delivery Method: Circle system utilized Preoxygenation: Pre-oxygenation with 100% oxygen Induction Type: IV induction Ventilation: Oral airway inserted - appropriate to patient size and Two handed mask ventilation required LMA: LMA with gastric port inserted LMA Size: 5.0 Number of attempts: 3 Dental Injury: Teeth and Oropharynx as per pre-operative assessment

## 2017-06-10 NOTE — Discharge Instructions (Addendum)
1. You may see some blood in the urine and may have some burning with urination for 48-72 hours. You also may notice that you have to urinate more frequently or urgently after your procedure which is normal.  2. You should call should you develop an inability urinate, fever > 101, persistent nausea and vomiting that prevents you from eating or drinking to stay hydrated.  3. If you have a stent, you will likely urinate more frequently and urgently until the stent is removed and you may experience some discomfort/pain in the lower abdomen and flank especially when urinating. You may take pain medication prescribed to you if needed for pain. You may also intermittently have blood in the urine until the stent is removed. 4. You may remove your stent on WEDNESDAY morning.  Simply pull the string that is taped to your body and the stent will easily come out.  This may be best done in the shower as some urine may come out with the stent.  Usually you will feel relief once the stent is removed, but occasionally patients can develop pain due to residual swelling of the ureter that may temporarily obstruct the kidney.  This can be managed by taking pain medication and it will typically resolve with time.  Please do not hesitate to call if you have pain that is not controlled with your pain medication or does not improved within 24-48 hours.       5.   You may restart ELIQUIS on Monday.

## 2017-06-10 NOTE — Op Note (Signed)
Preoperative diagnosis: Left renal calculus  Postoperative diagnosis: Left renal calculus  Procedure:  1. Cystoscopy 2. Left ureteroscopy and stone removal 3. Ureteroscopic laser lithotripsy 4. Left ureteral stent placement (6 x 26 with string) 5. Left retrograde pyelography with interpretation  Surgeon: Pryor Curia. M.D.  Anesthesia: General  Complications: None  Intraoperative findings: Left retrograde pyelography demonstrated a filling defect within the renal pelvis consistent with the patient's known calculus without other abnormalities noted.  EBL: Minimal  Specimens: 1. Left renal calculus  Disposition of specimens: [facility] for stone analysis  Indication: Desmund Elman  is a 72 y.o. patient with urolithiasis. He has a symptomatic left renal calculus. After reviewing the management options for treatment, they elected to proceed with the above surgical procedure(s). We have discussed the potential benefits and risks of the procedure, side effects of the proposed treatment, the likelihood of the patient achieving the goals of the procedure, and any potential problems that might occur during the procedure or recuperation. Informed consent has been obtained.  Description of procedure:  The patient was taken to the operating room and general anesthesia was induced.  The patient was placed in the dorsal lithotomy position, prepped and draped in the usual sterile fashion, and preoperative antibiotics were administered. A preoperative time-out was performed.   Cystourethroscopy was performed.  The patient's urethra was examined and was normal. The bladder was then systematically examined in its entirety. There was no evidence for any bladder tumors, stones, or other mucosal pathology.    Attention then turned to the left ureteral orifice and a ureteral catheter was used to intubate the ureteral orifice.  Omnipaque contrast was injected through the  ureteral catheter and a retrograde pyelogram was performed with findings as dictated above.  A 0.38 sensor guidewire was then advanced up the left ureter into the renal pelvis under fluoroscopic guidance.  A 12/14 Fr ureteral access sheath was then advanced over the guide wire. The digital flexible ureteroscope was then advanced through the access sheath into the ureter next to the guidewire and the calculus was identified and was located in the renal pelvis.   The stone was then fragmented with the 200 micron holmium laser fiber on a setting of 0.5 J and frequency of 25 Hz.   All sizable stones were then removed with a zero tip nitinol basket.  Reinspection of the ureter/renal pelvis revealed no remaining visible stones or fragments of significant size.   The safety wire was then replaced and the access sheath removed.  The guidewire was backloaded through the cystoscope and a ureteral stent was advance over the wire using Seldinger technique.  The stent was positioned appropriately under fluoroscopic and cystoscopic guidance.  The wire was then removed with an adequate stent curl noted in the renal pelvis as well as in the bladder.  The bladder was then emptied and the procedure ended.  The patient appeared to tolerate the procedure well and without complications.  The patient was able to be awakened and transferred to the recovery unit in satisfactory condition.   Pryor Curia MD

## 2017-06-10 NOTE — Interval H&P Note (Signed)
History and Physical Interval Note:  06/10/2017 1:50 PM  Spencer Cochran  has presented today for surgery, with the diagnosis of LEFT RENAL CALCULUS  The various methods of treatment have been discussed with the patient and family. After consideration of risks, benefits and other options for treatment, the patient has consented to  Procedure(s): CYSTOSCOPY/RETROGRADE/URETEROSCOPY/HOLMIUM LASER/STENT PLACEMENT (Left) as a surgical intervention .  The patient's history has been reviewed, patient examined, no change in status, stable for surgery.  I have reviewed the patient's chart and labs.  Questions were answered to the patient's satisfaction.     Pepper Kerrick,LES

## 2017-06-10 NOTE — Anesthesia Preprocedure Evaluation (Signed)
Anesthesia Evaluation  Patient identified by MRN, date of birth, ID band Patient awake    Reviewed: Allergy & Precautions, NPO status   Airway Mallampati: II  TM Distance: >3 FB     Dental   Pulmonary    breath sounds clear to auscultation       Cardiovascular hypertension, + CAD  + dysrhythmias  Rhythm:Regular Rate:Normal     Neuro/Psych    GI/Hepatic Neg liver ROS, GERD  ,  Endo/Other  negative endocrine ROS  Renal/GU negative Renal ROS     Musculoskeletal  (+) Arthritis ,   Abdominal   Peds  Hematology   Anesthesia Other Findings   Reproductive/Obstetrics                             Anesthesia Physical Anesthesia Plan  ASA: III  Anesthesia Plan: General   Post-op Pain Management:    Induction: Intravenous  PONV Risk Score and Plan: Treatment may vary due to age or medical condition  Airway Management Planned: LMA  Additional Equipment:   Intra-op Plan:   Post-operative Plan: Extubation in OR  Informed Consent: I have reviewed the patients History and Physical, chart, labs and discussed the procedure including the risks, benefits and alternatives for the proposed anesthesia with the patient or authorized representative who has indicated his/her understanding and acceptance.   Dental advisory given  Plan Discussed with:   Anesthesia Plan Comments:         Anesthesia Quick Evaluation

## 2017-06-11 ENCOUNTER — Encounter (HOSPITAL_COMMUNITY): Payer: Self-pay | Admitting: Urology

## 2017-06-14 DIAGNOSIS — Z139 Encounter for screening, unspecified: Secondary | ICD-10-CM | POA: Diagnosis not present

## 2017-06-14 DIAGNOSIS — I2510 Atherosclerotic heart disease of native coronary artery without angina pectoris: Secondary | ICD-10-CM | POA: Diagnosis not present

## 2017-06-14 DIAGNOSIS — Z7189 Other specified counseling: Secondary | ICD-10-CM | POA: Diagnosis not present

## 2017-06-14 DIAGNOSIS — Z0000 Encounter for general adult medical examination without abnormal findings: Secondary | ICD-10-CM | POA: Diagnosis not present

## 2017-06-14 DIAGNOSIS — Z9181 History of falling: Secondary | ICD-10-CM | POA: Diagnosis not present

## 2017-06-14 DIAGNOSIS — R7303 Prediabetes: Secondary | ICD-10-CM | POA: Diagnosis not present

## 2017-06-14 DIAGNOSIS — Z1331 Encounter for screening for depression: Secondary | ICD-10-CM | POA: Diagnosis not present

## 2017-06-14 DIAGNOSIS — I10 Essential (primary) hypertension: Secondary | ICD-10-CM | POA: Diagnosis not present

## 2017-06-14 NOTE — Anesthesia Postprocedure Evaluation (Signed)
Anesthesia Post Note  Patient: Spencer Cochran  Procedure(s) Performed: CYSTOSCOPY/RETROGRADE/URETEROSCOPY/HOLMIUM LASER/STENT PLACEMENT (Left )     Patient location during evaluation: PACU Anesthesia Type: General Level of consciousness: awake and alert Pain management: pain level controlled Vital Signs Assessment: post-procedure vital signs reviewed and stable Respiratory status: spontaneous breathing, nonlabored ventilation, respiratory function stable and patient connected to nasal cannula oxygen Cardiovascular status: blood pressure returned to baseline and stable Postop Assessment: no apparent nausea or vomiting Anesthetic complications: no    Last Vitals:  Vitals:   06/10/17 1700 06/10/17 1714  BP: 131/80 134/85  Pulse: 89 90  Resp: 13   Temp: 37.1 C 37 C  SpO2: 94% 96%    Last Pain:  Vitals:   06/10/17 1714  TempSrc:   PainSc: 3    Pain Goal: Patients Stated Pain Goal: 4 (06/10/17 1344)               [place]

## 2017-06-18 ENCOUNTER — Ambulatory Visit: Payer: Medicare Other | Admitting: Sports Medicine

## 2017-06-25 ENCOUNTER — Other Ambulatory Visit: Payer: Self-pay | Admitting: Cardiology

## 2017-06-30 ENCOUNTER — Encounter: Payer: Self-pay | Admitting: Sports Medicine

## 2017-06-30 ENCOUNTER — Ambulatory Visit (INDEPENDENT_AMBULATORY_CARE_PROVIDER_SITE_OTHER): Payer: Medicare Other | Admitting: Sports Medicine

## 2017-06-30 DIAGNOSIS — M79675 Pain in left toe(s): Secondary | ICD-10-CM | POA: Diagnosis not present

## 2017-06-30 DIAGNOSIS — B351 Tinea unguium: Secondary | ICD-10-CM

## 2017-06-30 DIAGNOSIS — M79674 Pain in right toe(s): Secondary | ICD-10-CM | POA: Diagnosis not present

## 2017-06-30 DIAGNOSIS — Z7901 Long term (current) use of anticoagulants: Secondary | ICD-10-CM

## 2017-06-30 DIAGNOSIS — Z872 Personal history of diseases of the skin and subcutaneous tissue: Secondary | ICD-10-CM

## 2017-06-30 NOTE — Progress Notes (Signed)
Subjective: Spencer Cochran is a 72 y.o. male patient seen today in office with complaint of mildly painful thickened and elongated toenails; unable to trim. Patient is still on Eliquis blood thinner for history of Cardiac/ A Fib. Patient reports that his ingrown toenails on the big toe are starting to grow back out causing a little discomfort along the sides of the toes reports that since last visit he did have to trim his big toes otherwise patient has no other pedal complaints at this time.   Patient Active Problem List   Diagnosis Date Noted  . Coronary artery disease 10/01/2016  . Dyslipidemia 10/01/2016  . Atrial fibrillation ([place]) 03/08/2014  . Benign prostatic hypertrophy with incomplete bladder emptying 04/20/2013    Current Outpatient Medications on File Prior to Visit  Medication Sig Dispense Refill  . amLODipine-benazepril (LOTREL) 5-40 MG capsule TAKE 1 CAPSULE DAILY 90 capsule 1  . amoxicillin-clavulanate (AUGMENTIN) 875-125 MG tablet Take 1 tablet by mouth 2 (two) times daily. 14 tablet 0  . atorvastatin (LIPITOR) 40 MG tablet Take 40 mg by mouth at bedtime.     . carvedilol (COREG) 25 MG tablet Take 25 mg by mouth 2 (two) times daily with a meal.    . ferrous sulfate 325 (65 FE) MG EC tablet Take 325 mg by mouth daily with breakfast.    . finasteride (PROSCAR) 5 MG tablet Take 5 mg by mouth daily.    . fluticasone (FLONASE) 50 MCG/ACT nasal spray Place 1 spray into both nostrils daily as needed for allergies or rhinitis.    . furosemide (LASIX) 40 MG tablet TAKE 1 TABLET EVERY OTHER DAY 45 tablet 3  . nabumetone (RELAFEN) 500 MG tablet Take 500 mg by mouth 2 (two) times daily as needed for mild pain or moderate pain.    . nitroGLYCERIN (NITROSTAT) 0.4 MG SL tablet Place 1 tablet (0.4 mg total) under the tongue every 5 (five) minutes as needed for chest pain. 90 tablet 1  . omeprazole (PRILOSEC) 20 MG capsule Take 20 mg by mouth every morning.    Marland Kitchen oxyCODONE-acetaminophen  (PERCOCET) 5-325 MG tablet Take 1-2 tablets by mouth every 6 (six) hours as needed for severe pain. 20 tablet 0  . timolol (TIMOPTIC) 0.5 % ophthalmic solution 1 drop 2 (two) times daily.     No current facility-administered medications on file prior to visit.     Allergies  Allergen Reactions  . Hydrocodone Anxiety    Objective: Physical Exam  General: Well developed, nourished, no acute distress, awake, alert and oriented x 3  Vascular: Dorsalis pedis artery 1/4 bilateral, Posterior tibial artery 0/4 bilateral due to trace edema at ankles bilateral, skin temperature warm to warm proximal to distal bilateral lower extremities, mild varicosities, scant pedal hair present bilateral.  Neurological: Gross sensation present via light touch bilateral.   Dermatological: Skin is warm, dry, and supple bilateral, Nails 1-10 are tender, long, thick, and discolored with mild subungal debris,  there is mild incurvation at medial and lateral hallux nail margins without any acute signs of infection, no webspace macerations present bilateral, no open lesions present bilateral, no callus/corns/hyperkeratotic tissue present bilateral. No signs of infection bilateral.  Musculoskeletal: No current symptomatic boney deformities noted bilateral. Muscular strength within normal limits without painon range of motion. No pain with calf compression bilateral.  Assessment and Plan:  Problem List Items Addressed This Visit    None    Visit Diagnoses    Pain due to onychomycosis of toenails  of both feet    -  Primary   History of ingrowing nail       Long term current use of anticoagulant therapy         -Examined patient.  -Discussed treatment options for painful mycotic nails. -Mechanically debrided and reduced mycotic nails and removed all offending nail borders with sterile nail nipper and dremel nail file without incident. -Applied topical pain cream to bilateral hallux nail marginsto assist with any  post trim pain and recommend patient to use a nail file to keep nails filed to prevent nails from growing out long and into skin  -Patient to return in 3 months for follow up evaluation or sooner if symptoms worsen.  Landis Martins, DPM

## 2017-07-14 DIAGNOSIS — R8271 Bacteriuria: Secondary | ICD-10-CM | POA: Diagnosis not present

## 2017-07-14 DIAGNOSIS — R3912 Poor urinary stream: Secondary | ICD-10-CM | POA: Diagnosis not present

## 2017-07-14 DIAGNOSIS — N200 Calculus of kidney: Secondary | ICD-10-CM | POA: Diagnosis not present

## 2017-07-14 DIAGNOSIS — N401 Enlarged prostate with lower urinary tract symptoms: Secondary | ICD-10-CM | POA: Diagnosis not present

## 2017-07-14 DIAGNOSIS — N5201 Erectile dysfunction due to arterial insufficiency: Secondary | ICD-10-CM | POA: Diagnosis not present

## 2017-07-16 DIAGNOSIS — H47233 Glaucomatous optic atrophy, bilateral: Secondary | ICD-10-CM | POA: Diagnosis not present

## 2017-07-16 DIAGNOSIS — H401132 Primary open-angle glaucoma, bilateral, moderate stage: Secondary | ICD-10-CM | POA: Diagnosis not present

## 2017-08-10 DIAGNOSIS — L570 Actinic keratosis: Secondary | ICD-10-CM | POA: Diagnosis not present

## 2017-08-10 DIAGNOSIS — L820 Inflamed seborrheic keratosis: Secondary | ICD-10-CM | POA: Diagnosis not present

## 2017-08-10 DIAGNOSIS — L821 Other seborrheic keratosis: Secondary | ICD-10-CM | POA: Diagnosis not present

## 2017-08-10 DIAGNOSIS — L578 Other skin changes due to chronic exposure to nonionizing radiation: Secondary | ICD-10-CM | POA: Diagnosis not present

## 2017-08-17 DIAGNOSIS — M5136 Other intervertebral disc degeneration, lumbar region: Secondary | ICD-10-CM | POA: Diagnosis not present

## 2017-08-18 DIAGNOSIS — M1711 Unilateral primary osteoarthritis, right knee: Secondary | ICD-10-CM | POA: Diagnosis not present

## 2017-08-18 DIAGNOSIS — Z96651 Presence of right artificial knee joint: Secondary | ICD-10-CM | POA: Diagnosis not present

## 2017-08-23 DIAGNOSIS — M5136 Other intervertebral disc degeneration, lumbar region: Secondary | ICD-10-CM | POA: Diagnosis not present

## 2017-08-23 DIAGNOSIS — M5126 Other intervertebral disc displacement, lumbar region: Secondary | ICD-10-CM | POA: Diagnosis not present

## 2017-08-23 DIAGNOSIS — N1330 Unspecified hydronephrosis: Secondary | ICD-10-CM | POA: Diagnosis not present

## 2017-08-23 DIAGNOSIS — M545 Low back pain: Secondary | ICD-10-CM | POA: Diagnosis not present

## 2017-08-26 DIAGNOSIS — M5136 Other intervertebral disc degeneration, lumbar region: Secondary | ICD-10-CM | POA: Diagnosis not present

## 2017-09-01 DIAGNOSIS — I10 Essential (primary) hypertension: Secondary | ICD-10-CM | POA: Diagnosis not present

## 2017-09-01 DIAGNOSIS — Z9181 History of falling: Secondary | ICD-10-CM | POA: Diagnosis not present

## 2017-09-01 DIAGNOSIS — I2510 Atherosclerotic heart disease of native coronary artery without angina pectoris: Secondary | ICD-10-CM | POA: Diagnosis not present

## 2017-09-22 DIAGNOSIS — H52223 Regular astigmatism, bilateral: Secondary | ICD-10-CM | POA: Diagnosis not present

## 2017-09-22 DIAGNOSIS — H524 Presbyopia: Secondary | ICD-10-CM | POA: Diagnosis not present

## 2017-09-22 DIAGNOSIS — I10 Essential (primary) hypertension: Secondary | ICD-10-CM | POA: Diagnosis not present

## 2017-09-22 DIAGNOSIS — H401132 Primary open-angle glaucoma, bilateral, moderate stage: Secondary | ICD-10-CM | POA: Diagnosis not present

## 2017-09-22 DIAGNOSIS — H25043 Posterior subcapsular polar age-related cataract, bilateral: Secondary | ICD-10-CM | POA: Diagnosis not present

## 2017-09-22 DIAGNOSIS — H47233 Glaucomatous optic atrophy, bilateral: Secondary | ICD-10-CM | POA: Diagnosis not present

## 2017-09-22 DIAGNOSIS — H5213 Myopia, bilateral: Secondary | ICD-10-CM | POA: Diagnosis not present

## 2017-09-23 DIAGNOSIS — M25552 Pain in left hip: Secondary | ICD-10-CM | POA: Diagnosis not present

## 2017-09-27 ENCOUNTER — Ambulatory Visit: Payer: Medicare Other | Admitting: Cardiology

## 2017-09-27 DIAGNOSIS — M5126 Other intervertebral disc displacement, lumbar region: Secondary | ICD-10-CM | POA: Diagnosis not present

## 2017-09-27 DIAGNOSIS — M5136 Other intervertebral disc degeneration, lumbar region: Secondary | ICD-10-CM | POA: Diagnosis not present

## 2017-09-29 ENCOUNTER — Ambulatory Visit (INDEPENDENT_AMBULATORY_CARE_PROVIDER_SITE_OTHER): Payer: Medicare Other | Admitting: Sports Medicine

## 2017-09-29 ENCOUNTER — Encounter: Payer: Self-pay | Admitting: Sports Medicine

## 2017-09-29 VITALS — BP 119/69 | HR 74 | Resp 15

## 2017-09-29 DIAGNOSIS — Z7901 Long term (current) use of anticoagulants: Secondary | ICD-10-CM

## 2017-09-29 DIAGNOSIS — B351 Tinea unguium: Secondary | ICD-10-CM

## 2017-09-29 DIAGNOSIS — M722 Plantar fascial fibromatosis: Secondary | ICD-10-CM

## 2017-09-29 DIAGNOSIS — M79675 Pain in left toe(s): Secondary | ICD-10-CM

## 2017-09-29 DIAGNOSIS — M79674 Pain in right toe(s): Secondary | ICD-10-CM | POA: Diagnosis not present

## 2017-09-29 DIAGNOSIS — Z872 Personal history of diseases of the skin and subcutaneous tissue: Secondary | ICD-10-CM

## 2017-09-29 NOTE — Progress Notes (Signed)
Subjective: Spencer Cochran is a 72 y.o. male patient seen today in office with complaint of mildly painful thickened and elongated toenails; unable to trim. Patient is still on Eliquis blood thinner for history of Cardiac/ A Fib. Patient reports that his ingrown toenails on the big toe are starting to grow back out causing a little discomfort along the sides of the toes like before.  Patient also reports that he desires to have his insoles refurbished is brought them with him today.  No other pedal complaints at this time.   Patient Active Problem List   Diagnosis Date Noted  . Coronary artery disease 10/01/2016  . Dyslipidemia 10/01/2016  . Atrial fibrillation ([place]) 03/08/2014  . Benign prostatic hypertrophy with incomplete bladder emptying 04/20/2013    Current Outpatient Medications on File Prior to Visit  Medication Sig Dispense Refill  . amLODipine-benazepril (LOTREL) 5-40 MG capsule TAKE 1 CAPSULE DAILY 90 capsule 1  . apixaban (ELIQUIS) 5 MG TABS tablet Take 5 mg by mouth 2 (two) times daily.    Marland Kitchen atorvastatin (LIPITOR) 40 MG tablet Take 40 mg by mouth at bedtime.     . carvedilol (COREG) 25 MG tablet Take 25 mg by mouth 2 (two) times daily with a meal.    . furosemide (LASIX) 40 MG tablet TAKE 1 TABLET EVERY OTHER DAY 45 tablet 3  . omeprazole (PRILOSEC) 20 MG capsule Take 20 mg by mouth every morning.    . timolol (TIMOPTIC) 0.5 % ophthalmic solution 1 drop 2 (two) times daily.     No current facility-administered medications on file prior to visit.     Allergies  Allergen Reactions  . Hydrocodone Anxiety    Objective: Physical Exam  General: Well developed, nourished, no acute distress, awake, alert and oriented x 3  Vascular: Dorsalis pedis artery 1/4 bilateral, Posterior tibial artery 0/4 bilateral due to trace edema at ankles bilateral, skin temperature warm to warm proximal to distal bilateral lower extremities, mild varicosities, scant pedal hair present  bilateral.  Neurological: Gross sensation present via light touch bilateral.   Dermatological: Skin is warm, dry, and supple bilateral, Nails 1-10 are tender, long, thick, and discolored with mild subungal debris,  there is mild incurvation at medial and lateral hallux nail margins without any acute signs of infection, no webspace macerations present bilateral, no open lesions present bilateral, no callus/corns/hyperkeratotic tissue present bilateral. No signs of infection bilateral.  Musculoskeletal: Pes planus deformities noted bilateral. Muscular strength within normal limits without painon range of motion. No pain with calf compression bilateral.  Assessment and Plan:  Problem List Items Addressed This Visit    None    Visit Diagnoses    Pain due to onychomycosis of toenails of both feet    -  Primary   History of ingrowing nail       Long term current use of anticoagulant therapy         -Examined patient.  -Discussed treatment options for painful mycotic nails. -Mechanically debrided and reduced mycotic nails and removed all offending nail borders with sterile nail nipper and dremel nail file without incident. -Since his orthotics to [place] lab to have the top-cover refurbished -Patient to return in 3 months for follow up nail care and when called to pick up orthotics or sooner if symptoms worsen.  Landis Martins, DPM

## 2017-10-01 DIAGNOSIS — I10 Essential (primary) hypertension: Secondary | ICD-10-CM | POA: Diagnosis not present

## 2017-10-01 DIAGNOSIS — I2510 Atherosclerotic heart disease of native coronary artery without angina pectoris: Secondary | ICD-10-CM | POA: Diagnosis not present

## 2017-10-01 DIAGNOSIS — R7303 Prediabetes: Secondary | ICD-10-CM | POA: Diagnosis not present

## 2017-10-12 DIAGNOSIS — Z6833 Body mass index (BMI) 33.0-33.9, adult: Secondary | ICD-10-CM | POA: Diagnosis not present

## 2017-10-14 ENCOUNTER — Telehealth: Payer: Self-pay | Admitting: Sports Medicine

## 2017-10-14 NOTE — Telephone Encounter (Signed)
Called patient and informed him they have not returned to the office yet.  I will call when they are in.

## 2017-10-14 NOTE — Telephone Encounter (Signed)
I'm calling in regards to the pair of orthotics I dropped off to be refurbished. I was wondering if you know when they might be ready? Please give me a call and if no one answers you can leave a message at [phone]. Thank you.

## 2017-10-18 ENCOUNTER — Encounter: Payer: Self-pay | Admitting: Gastroenterology

## 2017-10-18 DIAGNOSIS — Z86010 Personal history of colonic polyps: Secondary | ICD-10-CM | POA: Diagnosis not present

## 2017-10-19 DIAGNOSIS — Z6833 Body mass index (BMI) 33.0-33.9, adult: Secondary | ICD-10-CM | POA: Diagnosis not present

## 2017-10-25 ENCOUNTER — Encounter: Payer: Self-pay | Admitting: *Deleted

## 2017-10-25 DIAGNOSIS — Z23 Encounter for immunization: Secondary | ICD-10-CM | POA: Diagnosis not present

## 2017-10-25 DIAGNOSIS — E782 Mixed hyperlipidemia: Secondary | ICD-10-CM | POA: Diagnosis not present

## 2017-10-25 DIAGNOSIS — Z139 Encounter for screening, unspecified: Secondary | ICD-10-CM | POA: Diagnosis not present

## 2017-10-25 DIAGNOSIS — M722 Plantar fascial fibromatosis: Secondary | ICD-10-CM

## 2017-10-25 DIAGNOSIS — I2510 Atherosclerotic heart disease of native coronary artery without angina pectoris: Secondary | ICD-10-CM | POA: Diagnosis not present

## 2017-10-25 DIAGNOSIS — R7303 Prediabetes: Secondary | ICD-10-CM | POA: Diagnosis not present

## 2017-10-25 NOTE — Progress Notes (Signed)
Patient picked up refurbished orthotics and dropped off another pair to be recovered.

## 2017-10-27 DIAGNOSIS — R197 Diarrhea, unspecified: Secondary | ICD-10-CM | POA: Diagnosis not present

## 2017-10-28 DIAGNOSIS — M47817 Spondylosis without myelopathy or radiculopathy, lumbosacral region: Secondary | ICD-10-CM | POA: Diagnosis not present

## 2017-10-28 DIAGNOSIS — M5136 Other intervertebral disc degeneration, lumbar region: Secondary | ICD-10-CM | POA: Diagnosis not present

## 2017-11-01 DIAGNOSIS — R7303 Prediabetes: Secondary | ICD-10-CM | POA: Diagnosis not present

## 2017-11-01 DIAGNOSIS — E782 Mixed hyperlipidemia: Secondary | ICD-10-CM | POA: Diagnosis not present

## 2017-11-01 DIAGNOSIS — I2510 Atherosclerotic heart disease of native coronary artery without angina pectoris: Secondary | ICD-10-CM | POA: Diagnosis not present

## 2017-11-01 DIAGNOSIS — I10 Essential (primary) hypertension: Secondary | ICD-10-CM | POA: Diagnosis not present

## 2017-11-09 DIAGNOSIS — Z6833 Body mass index (BMI) 33.0-33.9, adult: Secondary | ICD-10-CM | POA: Diagnosis not present

## 2017-11-15 ENCOUNTER — Encounter: Payer: Self-pay | Admitting: Cardiology

## 2017-11-15 ENCOUNTER — Ambulatory Visit (INDEPENDENT_AMBULATORY_CARE_PROVIDER_SITE_OTHER): Payer: Medicare Other | Admitting: Cardiology

## 2017-11-15 VITALS — BP 102/70 | HR 96 | Ht 68.0 in | Wt 222.4 lb

## 2017-11-15 DIAGNOSIS — E785 Hyperlipidemia, unspecified: Secondary | ICD-10-CM

## 2017-11-15 DIAGNOSIS — I2510 Atherosclerotic heart disease of native coronary artery without angina pectoris: Secondary | ICD-10-CM | POA: Diagnosis not present

## 2017-11-15 DIAGNOSIS — I4821 Permanent atrial fibrillation: Secondary | ICD-10-CM

## 2017-11-15 NOTE — Progress Notes (Signed)
Cardiology Office Note:    Date:  11/15/2017   ID:  Spencer Cochran, DOB 07-17-1945, MRN [id number]  PCP:  Spencer Shivers, MD  Cardiologist:  Spencer Campus, MD    Referring MD: Spencer Shivers, MD   Chief Complaint  Patient presents with  . Follow-up  Doing very well  History of Present Illness:    Spencer Cochran is a 72 y.o. male with chronic atrial fibrillation, coronary artery disease overall doing well goes to [facility] on the regular basis also participate in plan to lose weight with good success overall he feels well no chest pain tightness squeezing pressure burning chest no palpitations no dizziness no passing out.  Past Medical History:  Diagnosis Date  . Arthritis knees and ankle  . Asymptomatic gallstones   . Cancer ([place])    skin   . Cataract immature BILATERAL EYES  . Coronary artery disease CARDIOLOGIST-  DR Spencer Cochran  Tia Alert)---  LAST VISIT 04-29-2011   DENIES CARDIAC SYMPTOMS  . DDD (degenerative disc disease), lumbar   . Dysrhythmia    A Fib   . Frequency of urination   . GERD (gastroesophageal reflux disease)   . Glaucoma   . Heart murmur   . History of kidney stones   . Hypertension   . 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 Left ventricular diastolic dysfunction PER ECHO 03-31-2011  W/ CHART  . Nocturia   . OSA on CPAP    wears cpap  . Pre-diabetes   . Urethral stricture     Past Surgical History:  Procedure Laterality Date  . APPENDECTOMY  1972  . CARDIOVASCULAR STRESS TEST  04-29-2010   DR KRASAWSKI   NO EVIDENCE ISCHEMIA/ NORMAL LVSF AND WALL MOTION/ EF 63%  . CERVICAL FUSION  2006   C3 - 6  . colonscopy     . CYSTO/ URETHRAL DILATION/ TRANSURETHRAL INCISIONOF PROSTATE  01-13-2007  . CYSTOSCOPY  2005  . CYSTOSCOPY W/ RETROGRADES  06/12/2011   Procedure: CYSTOSCOPY WITH RETROGRADE PYELOGRAM;  Surgeon: Spencer Rud, MD;  Location: [facility];  Service: Urology;  Laterality: N/A;   cysto, urethral dilation, right retrograde pyelogram    . CYSTOSCOPY WITH RETROGRADE PYELOGRAM, URETEROSCOPY AND STENT PLACEMENT Left 03/08/2014   Procedure: CYSTOSCOPY WITH LEFT RETROGRADE PYELOGRAM/LEFT  URETEROSCOPY;  Surgeon: Spencer Rud, MD;  Location: [facility];  Service: Urology;  Laterality: Left;  . CYSTOSCOPY WITH URETHRAL DILATATION N/A 04/20/2013   Procedure: CYSTOSCOPY WITH URETHRAL DILATATION;  Surgeon: Spencer Rud, MD;  Location: [facility];  Service: Urology;  Laterality: N/A;  . CYSTOSCOPY/URETEROSCOPY/HOLMIUM LASER/STENT PLACEMENT Left 06/10/2017   Procedure: CYSTOSCOPY/RETROGRADE/URETEROSCOPY/HOLMIUM LASER/STENT PLACEMENT;  Surgeon: Spencer Bring, MD;  Location: [facility];  Service: Urology;  Laterality: Left;  . EXTRACORPOREAL SHOCK WAVE LITHOTRIPSY  01-17-2007   LEFT  . HOLMIUM LASER APPLICATION Left 07/10/1243   Procedure: LASER OF LEFT RENAL PELVIC STONE;  Surgeon: Spencer Rud, MD;  Location: [facility];  Service: Urology;  Laterality: Left;  . INGUINAL HERNIA REPAIR  2003  . JOINT REPLACEMENT     total knee right 03-01-17  . LEFT ACHILLES TENDON REPAIR  1992  . NASAL SINUS SURGERY  2012  . PENILE SURGERY  OF MEATUS  1955  . TRANSTHORACIC ECHOCARDIOGRAM  03-31-2011   NORMAL LVEF  59%/ TRIVIAL MR/ DIASTOLIC DYSFUNCTION/ MODERATE LVH  . TRANSURETHRAL RESECTION OF PROSTATE N/A 04/20/2013   Procedure: TRANSURETHRAL RESECTION OF THE PROSTATE WITH GYRUS INSTRUMENTS;  Surgeon: Spencer Rud, MD;  Location: [facility];  Service: Urology;  Laterality: N/A;    Current Medications: Current Meds  Medication Sig  . amLODipine-benazepril (LOTREL) 5-40 MG capsule TAKE 1 CAPSULE DAILY  . apixaban (ELIQUIS) 5 MG TABS tablet Take 5 mg by mouth 2 (two) times daily.  Marland Kitchen atorvastatin (LIPITOR) 40 MG tablet Take 40 mg by mouth at bedtime.   . carvedilol (COREG) 25 MG tablet Take 25 mg by mouth 2 (two) times daily with a meal.  . furosemide (LASIX) 40 MG tablet TAKE 1 TABLET  EVERY OTHER DAY  . omeprazole (PRILOSEC) 20 MG capsule Take 20 mg by mouth every morning.  . timolol (TIMOPTIC) 0.5 % ophthalmic solution 1 drop 2 (two) times daily.     Allergies:   Hydrocodone   Social History   Socioeconomic History  . Marital status: Married    Spouse name: Not on file  . Number of children: Not on file  . Years of education: Not on file  . Highest education level: Not on file  Occupational History  . Not on file  Social Needs  . Financial resource strain: Not on file  . Food insecurity:    Worry: Not on file    Inability: Not on file  . Transportation needs:    Medical: Not on file    Non-medical: Not on file  Tobacco Use  . Smoking status: Never Smoker  . Smokeless tobacco: Never Used  Substance and Sexual Activity  . Alcohol use: No  . Drug use: No  . Sexual activity: Yes  Lifestyle  . Physical activity:    Days per week: Not on file    Minutes per session: Not on file  . Stress: Not on file  Relationships  . Social connections:    Talks on phone: Not on file    Gets together: Not on file    Attends religious service: Not on file    Active member of club or organization: Not on file    Attends meetings of clubs or organizations: Not on file    Relationship status: Not on file  Other Topics Concern  . Not on file  Social History Narrative  . Not on file     Family History: The patient's family history includes Breast cancer in his mother; Cirrhosis in his father. ROS:   Please see the history of present illness.    All 14 point review of systems negative except as described per history of present illness  EKGs/Labs/Other Studies Reviewed:      Recent Labs: 06/04/2017: BUN 19; Creatinine, Ser 0.77; Hemoglobin 14.3; Platelets 189; Potassium 3.9; Sodium 141  Recent Lipid Panel No results found for: CHOL, TRIG, HDL, CHOLHDL, VLDL, LDLCALC, LDLDIRECT  Physical Exam:    VS:  BP 102/70   Pulse 96   Ht 5\' 8"  (1.727 m)   Wt 222 lb 6.4  oz (100.9 kg)   SpO2 97%   BMI 33.82 kg/m     Wt Readings from Last 3 Encounters:  11/15/17 222 lb 6.4 oz (100.9 kg)  06/10/17 240 lb (108.9 kg)  06/04/17 240 lb (108.9 kg)     GEN:  Well nourished, well developed in no acute distress HEENT: Normal NECK: No JVD; No carotid bruits LYMPHATICS: No lymphadenopathy CARDIAC: RRR, no murmurs, no rubs, no gallops RESPIRATORY:  Clear to auscultation without rales, wheezing or rhonchi  ABDOMEN: Soft, non-tender, non-distended MUSCULOSKELETAL:  No edema; No deformity  SKIN: Warm and  dry LOWER EXTREMITIES: no swelling NEUROLOGIC:  Alert and oriented x 3 PSYCHIATRIC:  Normal affect   ASSESSMENT:    1. Permanent atrial fibrillation   2. Dyslipidemia   3. Coronary artery disease involving native coronary artery of native heart without angina pectoris    PLAN:    In order of problems listed above:  1. Permanent atrial fibrillation anticoagulated with Eliquis which I will continue.  Overall doing well asymptomatic. 2. Dyslipidemia follow-up by his primary care physician that he recently had fasting lipid profile done apparently is good. 3. Coronary artery disease stable on appropriate medications which I will continue.   Medication Adjustments/Labs and Tests Ordered: Current medicines are reviewed at length with the patient today.  Concerns regarding medicines are outlined above.  No orders of the defined types were placed in this encounter.  Medication changes: No orders of the defined types were placed in this encounter.   Signed, Park Liter, MD, [facility] 11/15/2017 10:34 AM    [place]

## 2017-11-15 NOTE — Patient Instructions (Signed)
Medication Instructions:  Your physician recommends that you continue on your current medications as directed. Please refer to the Current Medication list given to you today.  If you need a refill on your cardiac medications before your next appointment, please call your pharmacy.   Lab work: None.  If you have labs (blood work) drawn today and your tests are completely normal, you will receive your results only by: . MyChart Message (if you have MyChart) OR . A paper copy in the mail If you have any lab test that is abnormal or we need to change your treatment, we will call you to review the results.  Testing/Procedures: None.   Follow-Up: At [facility], you and your health needs are our priority.  As part of our continuing mission to provide you with exceptional heart care, we have created designated Provider Care Teams.  These Care Teams include your primary Cardiologist (physician) and Advanced Practice Providers (APPs -  Physician Assistants and Nurse Practitioners) who all work together to provide you with the care you need, when you need it. You will need a follow up appointment in 6 months.  Please call our office 2 months in advance to schedule this appointment.  You may see Robert Krasowski, MD or another member of our [facility] Provider Team in [place]: Brian Munley, MD . Rajan Revankar, MD  Any Other Special Instructions Will Be Listed Below (If Applicable).     

## 2017-11-16 DIAGNOSIS — Z6833 Body mass index (BMI) 33.0-33.9, adult: Secondary | ICD-10-CM | POA: Diagnosis not present

## 2017-11-26 DIAGNOSIS — M25552 Pain in left hip: Secondary | ICD-10-CM | POA: Diagnosis not present

## 2017-11-30 DIAGNOSIS — Z6833 Body mass index (BMI) 33.0-33.9, adult: Secondary | ICD-10-CM | POA: Diagnosis not present

## 2017-12-02 DIAGNOSIS — I2510 Atherosclerotic heart disease of native coronary artery without angina pectoris: Secondary | ICD-10-CM | POA: Diagnosis not present

## 2017-12-02 DIAGNOSIS — E782 Mixed hyperlipidemia: Secondary | ICD-10-CM | POA: Diagnosis not present

## 2017-12-02 DIAGNOSIS — R7303 Prediabetes: Secondary | ICD-10-CM | POA: Diagnosis not present

## 2017-12-02 DIAGNOSIS — I10 Essential (primary) hypertension: Secondary | ICD-10-CM | POA: Diagnosis not present

## 2017-12-14 DIAGNOSIS — Z6833 Body mass index (BMI) 33.0-33.9, adult: Secondary | ICD-10-CM | POA: Diagnosis not present

## 2017-12-21 ENCOUNTER — Other Ambulatory Visit: Payer: Self-pay

## 2017-12-23 ENCOUNTER — Ambulatory Visit (INDEPENDENT_AMBULATORY_CARE_PROVIDER_SITE_OTHER): Payer: Medicare Other | Admitting: Sports Medicine

## 2017-12-23 ENCOUNTER — Other Ambulatory Visit: Payer: Self-pay | Admitting: Cardiology

## 2017-12-23 ENCOUNTER — Encounter: Payer: Self-pay | Admitting: Sports Medicine

## 2017-12-23 DIAGNOSIS — M79675 Pain in left toe(s): Secondary | ICD-10-CM

## 2017-12-23 DIAGNOSIS — B351 Tinea unguium: Secondary | ICD-10-CM | POA: Diagnosis not present

## 2017-12-23 DIAGNOSIS — Z7901 Long term (current) use of anticoagulants: Secondary | ICD-10-CM

## 2017-12-23 DIAGNOSIS — N200 Calculus of kidney: Secondary | ICD-10-CM | POA: Diagnosis not present

## 2017-12-23 DIAGNOSIS — M79674 Pain in right toe(s): Secondary | ICD-10-CM

## 2017-12-23 DIAGNOSIS — Z872 Personal history of diseases of the skin and subcutaneous tissue: Secondary | ICD-10-CM

## 2017-12-23 NOTE — Progress Notes (Signed)
Subjective: Spencer Cochran is a 72 y.o. male patient seen today in office with complaint of mildly painful thickened and elongated toenails; unable to trim. Patient is still on Eliquis blood thinner for history of Cardiac/ A Fib.  Patient denies any issues or changes with medical history since last visit.  No other pedal complaints at this time.   Patient Active Problem List   Diagnosis Date Noted  . Coronary artery disease 10/01/2016  . Dyslipidemia 10/01/2016  . Atrial fibrillation ([place]) 03/08/2014  . Benign prostatic hypertrophy with incomplete bladder emptying 04/20/2013    Current Outpatient Medications on File Prior to Visit  Medication Sig Dispense Refill  . amLODipine-benazepril (LOTREL) 5-40 MG capsule TAKE 1 CAPSULE DAILY 90 capsule 1  . apixaban (ELIQUIS) 5 MG TABS tablet Take 5 mg by mouth 2 (two) times daily.    Marland Kitchen atorvastatin (LIPITOR) 40 MG tablet Take 40 mg by mouth at bedtime.     . carvedilol (COREG) 25 MG tablet Take 25 mg by mouth 2 (two) times daily with a meal.    . furosemide (LASIX) 40 MG tablet TAKE 1 TABLET EVERY OTHER DAY 45 tablet 3  . omeprazole (PRILOSEC) 20 MG capsule Take 20 mg by mouth every morning.    . timolol (TIMOPTIC) 0.5 % ophthalmic solution 1 drop 2 (two) times daily.     No current facility-administered medications on file prior to visit.     Allergies  Allergen Reactions  . Hydrocodone Anxiety    Objective: Physical Exam  General: Well developed, nourished, no acute distress, awake, alert and oriented x 3  Vascular: Dorsalis pedis artery 1/4 bilateral, Posterior tibial artery 0/4 bilateral due to trace edema at ankles bilateral, skin temperature warm to warm proximal to distal bilateral lower extremities, mild varicosities, scant pedal hair present bilateral.  Neurological: Gross sensation present via light touch bilateral.   Dermatological: Skin is warm, dry, and supple bilateral, Nails 1-10 are tender, long, thick, and discolored  with mild subungal debris,  there is mild incurvation at medial and lateral hallux nail margins without any acute signs of infection, no webspace macerations present bilateral, no open lesions present bilateral, no callus/corns/hyperkeratotic tissue present bilateral. No signs of infection bilateral.  Musculoskeletal: Pes planus deformities noted bilateral. Muscular strength within normal limits without painon range of motion. No pain with calf compression bilateral.  Assessment and Plan:  Problem List Items Addressed This Visit    None    Visit Diagnoses    Pain due to onychomycosis of toenails of both feet    -  Primary   History of ingrowing nail       Long term current use of anticoagulant therapy         -Examined patient.  -Discussed treatment options for painful mycotic nails. -Mechanically debrided and reduced mycotic nails and removed all offending nail borders with sterile nail nipper and dremel nail file without incident. -Patient to return in 3 months for follow up nail care and when called to pick up orthotics or sooner if symptoms worsen.  Landis Martins, DPM

## 2017-12-24 DIAGNOSIS — N200 Calculus of kidney: Secondary | ICD-10-CM | POA: Diagnosis not present

## 2017-12-24 DIAGNOSIS — R3912 Poor urinary stream: Secondary | ICD-10-CM | POA: Diagnosis not present

## 2017-12-24 DIAGNOSIS — Z6833 Body mass index (BMI) 33.0-33.9, adult: Secondary | ICD-10-CM | POA: Diagnosis not present

## 2017-12-24 DIAGNOSIS — R8271 Bacteriuria: Secondary | ICD-10-CM | POA: Diagnosis not present

## 2017-12-24 DIAGNOSIS — N401 Enlarged prostate with lower urinary tract symptoms: Secondary | ICD-10-CM | POA: Diagnosis not present

## 2017-12-24 DIAGNOSIS — R310 Gross hematuria: Secondary | ICD-10-CM | POA: Diagnosis not present

## 2017-12-31 DIAGNOSIS — N200 Calculus of kidney: Secondary | ICD-10-CM | POA: Diagnosis not present

## 2018-01-01 DIAGNOSIS — I2510 Atherosclerotic heart disease of native coronary artery without angina pectoris: Secondary | ICD-10-CM | POA: Diagnosis not present

## 2018-01-01 DIAGNOSIS — I10 Essential (primary) hypertension: Secondary | ICD-10-CM | POA: Diagnosis not present

## 2018-01-01 DIAGNOSIS — R7303 Prediabetes: Secondary | ICD-10-CM | POA: Diagnosis not present

## 2018-01-01 DIAGNOSIS — E782 Mixed hyperlipidemia: Secondary | ICD-10-CM | POA: Diagnosis not present

## 2018-01-11 DIAGNOSIS — M533 Sacrococcygeal disorders, not elsewhere classified: Secondary | ICD-10-CM | POA: Diagnosis not present

## 2018-01-11 DIAGNOSIS — Z6833 Body mass index (BMI) 33.0-33.9, adult: Secondary | ICD-10-CM | POA: Diagnosis not present

## 2018-01-11 DIAGNOSIS — M25552 Pain in left hip: Secondary | ICD-10-CM | POA: Diagnosis not present

## 2018-01-11 DIAGNOSIS — M5136 Other intervertebral disc degeneration, lumbar region: Secondary | ICD-10-CM | POA: Diagnosis not present

## 2018-02-01 DIAGNOSIS — Z6833 Body mass index (BMI) 33.0-33.9, adult: Secondary | ICD-10-CM | POA: Diagnosis not present

## 2018-02-11 ENCOUNTER — Other Ambulatory Visit: Payer: Self-pay | Admitting: Cardiology

## 2018-02-15 DIAGNOSIS — I10 Essential (primary) hypertension: Secondary | ICD-10-CM | POA: Diagnosis not present

## 2018-02-15 DIAGNOSIS — Z6833 Body mass index (BMI) 33.0-33.9, adult: Secondary | ICD-10-CM | POA: Diagnosis not present

## 2018-02-15 DIAGNOSIS — I2510 Atherosclerotic heart disease of native coronary artery without angina pectoris: Secondary | ICD-10-CM | POA: Diagnosis not present

## 2018-02-15 DIAGNOSIS — R7303 Prediabetes: Secondary | ICD-10-CM | POA: Diagnosis not present

## 2018-02-16 DIAGNOSIS — L821 Other seborrheic keratosis: Secondary | ICD-10-CM | POA: Diagnosis not present

## 2018-02-16 DIAGNOSIS — L570 Actinic keratosis: Secondary | ICD-10-CM | POA: Diagnosis not present

## 2018-02-16 DIAGNOSIS — L300 Nummular dermatitis: Secondary | ICD-10-CM | POA: Diagnosis not present

## 2018-02-16 DIAGNOSIS — L578 Other skin changes due to chronic exposure to nonionizing radiation: Secondary | ICD-10-CM | POA: Diagnosis not present

## 2018-02-22 DIAGNOSIS — R7303 Prediabetes: Secondary | ICD-10-CM | POA: Diagnosis not present

## 2018-02-22 DIAGNOSIS — Z6831 Body mass index (BMI) 31.0-31.9, adult: Secondary | ICD-10-CM | POA: Diagnosis not present

## 2018-02-22 DIAGNOSIS — Z9181 History of falling: Secondary | ICD-10-CM | POA: Diagnosis not present

## 2018-02-22 DIAGNOSIS — I4891 Unspecified atrial fibrillation: Secondary | ICD-10-CM | POA: Diagnosis not present

## 2018-03-01 DIAGNOSIS — Z6831 Body mass index (BMI) 31.0-31.9, adult: Secondary | ICD-10-CM | POA: Diagnosis not present

## 2018-03-04 DIAGNOSIS — I4891 Unspecified atrial fibrillation: Secondary | ICD-10-CM | POA: Diagnosis not present

## 2018-03-04 DIAGNOSIS — R7303 Prediabetes: Secondary | ICD-10-CM | POA: Diagnosis not present

## 2018-03-04 DIAGNOSIS — Z6833 Body mass index (BMI) 33.0-33.9, adult: Secondary | ICD-10-CM | POA: Diagnosis not present

## 2018-03-16 DIAGNOSIS — A6001 Herpesviral infection of penis: Secondary | ICD-10-CM | POA: Diagnosis not present

## 2018-03-16 DIAGNOSIS — N200 Calculus of kidney: Secondary | ICD-10-CM | POA: Diagnosis not present

## 2018-03-16 DIAGNOSIS — R8271 Bacteriuria: Secondary | ICD-10-CM | POA: Diagnosis not present

## 2018-03-21 DIAGNOSIS — I10 Essential (primary) hypertension: Secondary | ICD-10-CM | POA: Diagnosis not present

## 2018-03-21 DIAGNOSIS — E782 Mixed hyperlipidemia: Secondary | ICD-10-CM | POA: Diagnosis not present

## 2018-03-21 DIAGNOSIS — R7303 Prediabetes: Secondary | ICD-10-CM | POA: Diagnosis not present

## 2018-03-24 ENCOUNTER — Encounter: Payer: Self-pay | Admitting: Sports Medicine

## 2018-03-24 ENCOUNTER — Ambulatory Visit (INDEPENDENT_AMBULATORY_CARE_PROVIDER_SITE_OTHER): Payer: Medicare Other | Admitting: Sports Medicine

## 2018-03-24 DIAGNOSIS — B351 Tinea unguium: Secondary | ICD-10-CM

## 2018-03-24 DIAGNOSIS — M79675 Pain in left toe(s): Secondary | ICD-10-CM | POA: Diagnosis not present

## 2018-03-24 DIAGNOSIS — Z872 Personal history of diseases of the skin and subcutaneous tissue: Secondary | ICD-10-CM

## 2018-03-24 DIAGNOSIS — Z7901 Long term (current) use of anticoagulants: Secondary | ICD-10-CM

## 2018-03-24 DIAGNOSIS — M79674 Pain in right toe(s): Secondary | ICD-10-CM | POA: Diagnosis not present

## 2018-03-24 NOTE — Progress Notes (Signed)
Subjective: Spencer Cochran is a 73 y.o. male patient seen today in office with complaint of mildly painful thickened and elongated toenails; unable to trim. Patient is still on Eliquis blood thinner for history of Cardiac/ A Fib.  Reports that he is lost over 27 pounds with change of diet.  Patient denies any issues or changes with medical history since last visit.  No other pedal complaints at this time.   Patient Active Problem List   Diagnosis Date Noted  . Coronary artery disease 10/01/2016  . Dyslipidemia 10/01/2016  . Atrial fibrillation ([place]) 03/08/2014  . Benign prostatic hypertrophy with incomplete bladder emptying 04/20/2013    Current Outpatient Medications on File Prior to Visit  Medication Sig Dispense Refill  . amLODipine-benazepril (LOTREL) 5-40 MG capsule TAKE 1 CAPSULE DAILY 90 capsule 2  . apixaban (ELIQUIS) 5 MG TABS tablet Take 5 mg by mouth 2 (two) times daily.    Marland Kitchen atorvastatin (LIPITOR) 40 MG tablet Take 40 mg by mouth at bedtime.     . carvedilol (COREG) 25 MG tablet Take 25 mg by mouth 2 (two) times daily with a meal.    . furosemide (LASIX) 40 MG tablet TAKE 1 TABLET EVERY OTHER DAY 45 tablet 4  . nabumetone (RELAFEN) 500 MG tablet     . nitroGLYCERIN (NITROSTAT) 0.4 MG SL tablet     . omeprazole (PRILOSEC) 20 MG capsule Take 20 mg by mouth every morning.    . timolol (TIMOPTIC) 0.5 % ophthalmic solution 1 drop 2 (two) times daily.    . valACYclovir (VALTREX) 1000 MG tablet Take 1,000 mg by mouth 2 (two) times daily.     No current facility-administered medications on file prior to visit.     Allergies  Allergen Reactions  . Hydrocodone Anxiety    Objective: Physical Exam  General: Well developed, nourished, no acute distress, awake, alert and oriented x 3  Vascular: Dorsalis pedis artery 1/4 bilateral, Posterior tibial artery 0/4 bilateral due to trace edema at ankles bilateral, skin temperature warm to warm proximal to distal bilateral lower  extremities, mild varicosities, scant pedal hair present bilateral.  Neurological: Gross sensation present via light touch bilateral.   Dermatological: Skin is warm, dry, and supple bilateral, Nails 1-10 are tender, long, thick, and discolored with mild subungal debris,  there is mild incurvation at medial and lateral hallux nail margins without any acute signs of infection, no webspace macerations present bilateral, no open lesions present bilateral, no callus/corns/hyperkeratotic tissue present bilateral. No signs of infection bilateral.  Musculoskeletal: Pes planus deformities noted bilateral. Muscular strength within normal limits without painon range of motion. No pain with calf compression bilateral.  Assessment and Plan:  Problem List Items Addressed This Visit    None    Visit Diagnoses    Pain due to onychomycosis of toenails of both feet    -  Primary   Relevant Medications   valACYclovir (VALTREX) 1000 MG tablet   History of ingrowing nail       Long term current use of anticoagulant therapy         -Examined patient.  -Discussed treatment options for painful mycotic nails. -Mechanically debrided and reduced mycotic nails and removed all offending nail borders with sterile nail nipper and dremel nail file without incident. -Patient to return in 3 months for follow up nail care or sooner if symptoms worsen.  Landis Martins, DPM

## 2018-03-28 DIAGNOSIS — H401132 Primary open-angle glaucoma, bilateral, moderate stage: Secondary | ICD-10-CM | POA: Diagnosis not present

## 2018-03-28 DIAGNOSIS — R7303 Prediabetes: Secondary | ICD-10-CM | POA: Diagnosis not present

## 2018-03-28 DIAGNOSIS — I2510 Atherosclerotic heart disease of native coronary artery without angina pectoris: Secondary | ICD-10-CM | POA: Diagnosis not present

## 2018-03-28 DIAGNOSIS — E870 Hyperosmolality and hypernatremia: Secondary | ICD-10-CM | POA: Diagnosis not present

## 2018-03-28 DIAGNOSIS — H47233 Glaucomatous optic atrophy, bilateral: Secondary | ICD-10-CM | POA: Diagnosis not present

## 2018-03-28 DIAGNOSIS — I10 Essential (primary) hypertension: Secondary | ICD-10-CM | POA: Diagnosis not present

## 2018-03-28 DIAGNOSIS — H25043 Posterior subcapsular polar age-related cataract, bilateral: Secondary | ICD-10-CM | POA: Diagnosis not present

## 2018-03-30 DIAGNOSIS — N485 Ulcer of penis: Secondary | ICD-10-CM | POA: Diagnosis not present

## 2018-04-01 DIAGNOSIS — E870 Hyperosmolality and hypernatremia: Secondary | ICD-10-CM | POA: Diagnosis not present

## 2018-04-01 DIAGNOSIS — R7303 Prediabetes: Secondary | ICD-10-CM | POA: Diagnosis not present

## 2018-04-01 DIAGNOSIS — I10 Essential (primary) hypertension: Secondary | ICD-10-CM | POA: Diagnosis not present

## 2018-04-01 DIAGNOSIS — I2510 Atherosclerotic heart disease of native coronary artery without angina pectoris: Secondary | ICD-10-CM | POA: Diagnosis not present

## 2018-04-08 DIAGNOSIS — R2681 Unsteadiness on feet: Secondary | ICD-10-CM | POA: Diagnosis not present

## 2018-04-08 DIAGNOSIS — M5136 Other intervertebral disc degeneration, lumbar region: Secondary | ICD-10-CM | POA: Diagnosis not present

## 2018-04-08 DIAGNOSIS — Z139 Encounter for screening, unspecified: Secondary | ICD-10-CM | POA: Diagnosis not present

## 2018-04-11 DIAGNOSIS — R2681 Unsteadiness on feet: Secondary | ICD-10-CM | POA: Diagnosis not present

## 2018-04-11 DIAGNOSIS — R42 Dizziness and giddiness: Secondary | ICD-10-CM | POA: Diagnosis not present

## 2018-04-15 DIAGNOSIS — R42 Dizziness and giddiness: Secondary | ICD-10-CM | POA: Diagnosis not present

## 2018-04-15 DIAGNOSIS — R2681 Unsteadiness on feet: Secondary | ICD-10-CM | POA: Diagnosis not present

## 2018-04-18 DIAGNOSIS — R2681 Unsteadiness on feet: Secondary | ICD-10-CM | POA: Diagnosis not present

## 2018-04-18 DIAGNOSIS — R42 Dizziness and giddiness: Secondary | ICD-10-CM | POA: Diagnosis not present

## 2018-04-25 DIAGNOSIS — R2681 Unsteadiness on feet: Secondary | ICD-10-CM | POA: Diagnosis not present

## 2018-04-25 DIAGNOSIS — R42 Dizziness and giddiness: Secondary | ICD-10-CM | POA: Diagnosis not present

## 2018-05-02 DIAGNOSIS — R42 Dizziness and giddiness: Secondary | ICD-10-CM | POA: Diagnosis not present

## 2018-05-02 DIAGNOSIS — R2681 Unsteadiness on feet: Secondary | ICD-10-CM | POA: Diagnosis not present

## 2018-05-03 DIAGNOSIS — E870 Hyperosmolality and hypernatremia: Secondary | ICD-10-CM | POA: Diagnosis not present

## 2018-05-03 DIAGNOSIS — R7303 Prediabetes: Secondary | ICD-10-CM | POA: Diagnosis not present

## 2018-05-03 DIAGNOSIS — I10 Essential (primary) hypertension: Secondary | ICD-10-CM | POA: Diagnosis not present

## 2018-05-03 DIAGNOSIS — I2510 Atherosclerotic heart disease of native coronary artery without angina pectoris: Secondary | ICD-10-CM | POA: Diagnosis not present

## 2018-05-04 ENCOUNTER — Other Ambulatory Visit: Payer: Self-pay | Admitting: Cardiology

## 2018-05-11 DIAGNOSIS — R2681 Unsteadiness on feet: Secondary | ICD-10-CM | POA: Diagnosis not present

## 2018-05-11 DIAGNOSIS — R42 Dizziness and giddiness: Secondary | ICD-10-CM | POA: Diagnosis not present

## 2018-05-18 DIAGNOSIS — R2681 Unsteadiness on feet: Secondary | ICD-10-CM | POA: Diagnosis not present

## 2018-05-18 DIAGNOSIS — R42 Dizziness and giddiness: Secondary | ICD-10-CM | POA: Diagnosis not present

## 2018-05-24 ENCOUNTER — Telehealth: Payer: Self-pay

## 2018-05-24 DIAGNOSIS — R2681 Unsteadiness on feet: Secondary | ICD-10-CM | POA: Diagnosis not present

## 2018-05-24 DIAGNOSIS — Z7189 Other specified counseling: Secondary | ICD-10-CM | POA: Diagnosis not present

## 2018-05-24 NOTE — Telephone Encounter (Signed)
Called and spoke with patient yesterday about appointment for 4/29 being changed to Televisit. I advised that I would send a consent through his MyChart and he just needed to respond yes or no. Patient understood and agreed. Hayley, RN reached out to the patient this morning but was unable to talk to him about responding to his email. He called back and I reminded him to make sure he responded.

## 2018-05-25 DIAGNOSIS — R2681 Unsteadiness on feet: Secondary | ICD-10-CM | POA: Diagnosis not present

## 2018-05-25 DIAGNOSIS — R42 Dizziness and giddiness: Secondary | ICD-10-CM | POA: Diagnosis not present

## 2018-06-01 ENCOUNTER — Other Ambulatory Visit: Payer: Self-pay

## 2018-06-01 ENCOUNTER — Encounter: Payer: Self-pay | Admitting: Emergency Medicine

## 2018-06-01 ENCOUNTER — Encounter: Payer: Self-pay | Admitting: *Deleted

## 2018-06-01 ENCOUNTER — Encounter: Payer: Self-pay | Admitting: Cardiology

## 2018-06-01 ENCOUNTER — Telehealth (INDEPENDENT_AMBULATORY_CARE_PROVIDER_SITE_OTHER): Payer: Medicare Other | Admitting: Cardiology

## 2018-06-01 VITALS — BP 127/92 | HR 88 | Wt 195.0 lb

## 2018-06-01 DIAGNOSIS — R42 Dizziness and giddiness: Secondary | ICD-10-CM | POA: Diagnosis not present

## 2018-06-01 DIAGNOSIS — I4821 Permanent atrial fibrillation: Secondary | ICD-10-CM

## 2018-06-01 DIAGNOSIS — R2681 Unsteadiness on feet: Secondary | ICD-10-CM | POA: Diagnosis not present

## 2018-06-01 DIAGNOSIS — I2510 Atherosclerotic heart disease of native coronary artery without angina pectoris: Secondary | ICD-10-CM

## 2018-06-01 DIAGNOSIS — E785 Hyperlipidemia, unspecified: Secondary | ICD-10-CM

## 2018-06-01 NOTE — Progress Notes (Signed)
Virtual Visit via Video Note   This visit type was conducted due to national recommendations for restrictions regarding the COVID-19 Pandemic (e.g. social distancing) in an effort to limit this patient's exposure and mitigate transmission in our community.  Due to his co-morbid illnesses, this patient is at least at moderate risk for complications without adequate follow up.  This format is felt to be most appropriate for this patient at this time.  All issues noted in this document were discussed and addressed.  A limited physical exam was performed with this format.  Please refer to the patient's chart for his consent to telehealth for [facility].  Evaluation Performed:  Follow-up visit  This visit type was conducted due to national recommendations for restrictions regarding the COVID-19 Pandemic (e.g. social distancing).  This format is felt to be most appropriate for this patient at this time.  All issues noted in this document were discussed and addressed.  No physical exam was performed (except for noted visual exam findings with Video Visits).  Please refer to the patient's chart (MyChart message for video visits and phone note for telephone visits) for the patient's consent to telehealth for [facility].  Date:  06/01/2018  ID: Spencer Cochran, DOB [birth year], MRN [id number]   Patient Location: [place] [address]   Provider location:   [place] Office  PCP:  Maryella Shivers, MD  Cardiologist:  Jenne Campus, MD     Chief Complaint: Doing well  History of Present Illness:    Spencer Cochran is a 73 y.o. male  who presents via audio/video conferencing for a telehealth visit today.  With permanent atrial fibrillation, remote coronary artery disease, hypertension, dyslipidemia overall all doing very well.  He started exercising on the regular basis used to go to [facility] but now because of coronavirus situation once he is closed and he does  have exercises by walking.  He walks on a regular basis about 1-1/2 miles every single day.  On top of that he changed significantly his diet and so far he lost 40 pounds his target is to lose another 10 pounds to reach weight of 185.  He feels significantly better he does have much more energy just recently purchased some car was able to walk with recording with no difficulty previously he was not able to do that denies having any chest pain no palpitations no swelling of lower extremities overall doing very well   The patient does not have symptoms concerning for COVID-19 infection (fever, chills, cough, or new SHORTNESS OF BREATH).    Prior CV studies:   The following studies were reviewed today:       Past Medical History:  Diagnosis Date  . Arthritis knees and ankle  . Asymptomatic gallstones   . Cancer ([place])    skin   . Cataract immature BILATERAL EYES  . Coronary artery disease CARDIOLOGIST-  DR Agustin Cree  [facility])---  LAST VISIT 04-29-2011   DENIES CARDIAC SYMPTOMS  . DDD (degenerative disc disease), lumbar   . Dysrhythmia    A Fib   . Frequency of urination   . GERD (gastroesophageal reflux disease)   . Glaucoma   . Heart murmur   . History of kidney stones   . Hypertension   . 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 Left ventricular diastolic dysfunction PER ECHO 03-31-2011  W/ CHART  . Nocturia   . OSA on  CPAP    wears cpap  . Pre-diabetes   . Urethral stricture     Past Surgical History:  Procedure Laterality Date  . APPENDECTOMY  1972  . CARDIOVASCULAR STRESS TEST  04-29-2010   DR KRASAWSKI   NO EVIDENCE ISCHEMIA/ NORMAL LVSF AND WALL MOTION/ EF 63%  . CERVICAL FUSION  2006   C3 - 6  . colonscopy     . CYSTO/ URETHRAL DILATION/ TRANSURETHRAL INCISIONOF PROSTATE  01-13-2007  . CYSTOSCOPY  2005  . CYSTOSCOPY W/ RETROGRADES  06/12/2011   Procedure: CYSTOSCOPY WITH RETROGRADE PYELOGRAM;  Surgeon: Ailene Rud, MD;   Location: [facility];  Service: Urology;  Laterality: N/A;  cysto, urethral dilation, right retrograde pyelogram    . CYSTOSCOPY WITH RETROGRADE PYELOGRAM, URETEROSCOPY AND STENT PLACEMENT Left 03/08/2014   Procedure: CYSTOSCOPY WITH LEFT RETROGRADE PYELOGRAM/LEFT  URETEROSCOPY;  Surgeon: Ailene Rud, MD;  Location[facility];  Service: Urology;  Laterality: Left;  . CYSTOSCOPY WITH URETHRAL DILATATION N/A 04/20/2013   Procedure: CYSTOSCOPY WITH URETHRAL DILATATION;  Surgeon: Ailene Rud, MD;  Location[facility];  Service: Urology;  Laterality: N/A;  . CYSTOSCOPY/URETEROSCOPY/HOLMIUM LASER/STENT PLACEMENT Left 06/10/2017   Procedure: CYSTOSCOPY/RETROGRADE/URETEROSCOPY/HOLMIUM LASER/STENT PLACEMENT;  Surgeon: Raynelle Bring, MD;  [facility];  Service: Urology;  Laterality: Left;  . EXTRACORPOREAL SHOCK WAVE LITHOTRIPSY  01-17-2007   LEFT  . HOLMIUM LASER APPLICATION Left 5/0/0938   Procedure: LASER OF LEFT RENAL PELVIC STONE;  Surgeon: Ailene Rud, MD;  [facility];  Service: Urology;  Laterality: Left;  . INGUINAL HERNIA REPAIR  2003  . JOINT REPLACEMENT     total knee right 03-01-17  . LEFT ACHILLES TENDON REPAIR  1992  . NASAL SINUS SURGERY  2012  . PENILE SURGERY  OF MEATUS  1955  . TRANSTHORACIC ECHOCARDIOGRAM  03-31-2011   NORMAL LVEF  59%/ TRIVIAL MR/ DIASTOLIC DYSFUNCTION/ MODERATE LVH  . TRANSURETHRAL RESECTION OF PROSTATE N/A 04/20/2013   Procedure: TRANSURETHRAL RESECTION OF THE PROSTATE WITH GYRUS INSTRUMENTS;  Surgeon: Ailene Rud, MD;  [facility];  Service: Urology;  Laterality: N/A;     Current Meds  Medication Sig  . amLODipine-benazepril (LOTREL) 5-40 MG capsule TAKE 1 CAPSULE DAILY  . atorvastatin (LIPITOR) 40 MG tablet Take 40 mg by mouth at bedtime.   . carvedilol (COREG) 25 MG tablet Take 25 mg by mouth 2 (two) times daily with a meal.  . ELIQUIS 5 MG TABS tablet TAKE 1 TABLET TWICE A DAY  . furosemide (LASIX)  40 MG tablet TAKE 1 TABLET EVERY OTHER DAY  . nabumetone (RELAFEN) 500 MG tablet   . nitroGLYCERIN (NITROSTAT) 0.4 MG SL tablet   . omeprazole (PRILOSEC) 20 MG capsule Take 20 mg by mouth every morning.  . timolol (TIMOPTIC) 0.5 % ophthalmic solution 1 drop 2 (two) times daily.  . valACYclovir (VALTREX) 1000 MG tablet Take 1,000 mg by mouth 2 (two) times daily.      Family History: The patient's family history includes Breast cancer in his mother; Cirrhosis in his father.   ROS:   Please see the history of present illness.     All other systems reviewed and are negative.   Labs/Other Tests and Data Reviewed:     Recent Labs: 06/04/2017: BUN 19; Creatinine, Ser 0.77; Hemoglobin 14.3; Platelets 189; Potassium 3.9; Sodium 141  Recent Lipid Panel No results found for: CHOL, TRIG, HDL, CHOLHDL, VLDL, LDLCALC, LDLDIRECT    Exam:    Vital Signs:  BP (!) 127/92   Pulse 88   Wt 195 lb (88.5 kg)   BMI 29.65 kg/m     Wt Readings from Last 3 Encounters:  06/01/18 195 lb (88.5 kg)  11/15/17 222 lb 6.4 oz (100.9 kg)  06/10/17 240 lb (108.9 kg)     Well nourished, well developed in no acute distress. Alert awake 3.  On talking to him over the video link is sitting in the living room he is seems to be very comfortable and satisfied.  Diagnosis for this visit:   1. Permanent atrial fibrillation   2. Coronary artery disease involving native coronary artery of native heart without angina pectoris   3. Dyslipidemia      ASSESSMENT & PLAN:    1.  Permanent atrial fibrillation.  Rate controlled.  Anticoagulated we discussed the importance of taking anticoagulation. 2.  Coronary artery disease stable without any symptoms exercising on a regular basis advised to continue. 3.  Dyslipidemia followed by primary care physician will call the office to get a copy of fasting lipid profile.  Overall Spencer Cochran doing amazingly well and congratulated him for all weight loss and I congratulated  him for strict exercise routine.  Encouraged him to do continue doing it.  COVID-19 Education: The signs and symptoms of COVID-19 were discussed with the patient and how to seek care for testing (follow up with PCP or arrange E-visit).  The importance of social distancing was discussed today.  Patient Risk:   After full review of this patients clinical status, I feel that they are at least moderate risk at this time.  Time:   Today, I have spent 22 minutes with the patient with telehealth technology discussing pt health issues.  I spent 5 minutes reviewing her chart before the visit.  Visit was finished at 9:24 AM.    Medication Adjustments/Labs and Tests Ordered: Current medicines are reviewed at length with the patient today.  Concerns regarding medicines are outlined above.  No orders of the defined types were placed in this encounter.  Medication changes: No orders of the defined types were placed in this encounter.    Disposition: Follow-up 5 months  Signed, Park Liter, MD, [facility] 06/01/2018 9:23 AM    [place]

## 2018-06-01 NOTE — Patient Instructions (Signed)
Medication Instructions:  Your physician recommends that you continue on your current medications as directed. Please refer to the Current Medication list given to you today.  If you need a refill on your cardiac medications before your next appointment, please call your pharmacy.   Lab work: None If you have labs (blood work) drawn today and your tests are completely normal, you will receive your results only by: Marland Kitchen MyChart Message (if you have MyChart) OR . A paper copy in the mail If you have any lab test that is abnormal or we need to change your treatment, we will call you to review the results.  Testing/Procedures: nOne  Follow-Up: At [facility], you and your health needs are our priority.  As part of our continuing mission to provide you with exceptional heart care, we have created designated Provider Care Teams.  These Care Teams include your primary Cardiologist (physician) and Advanced Practice Providers (APPs -  Physician Assistants and Nurse Practitioners) who all work together to provide you with the care you need, when you need it. You will need a follow up appointment in 5 months.  Any Other Special Instructions Will Be Listed Below (If Applicable).

## 2018-06-02 DIAGNOSIS — R7303 Prediabetes: Secondary | ICD-10-CM | POA: Diagnosis not present

## 2018-06-02 DIAGNOSIS — I2510 Atherosclerotic heart disease of native coronary artery without angina pectoris: Secondary | ICD-10-CM | POA: Diagnosis not present

## 2018-06-02 DIAGNOSIS — I10 Essential (primary) hypertension: Secondary | ICD-10-CM | POA: Diagnosis not present

## 2018-06-02 DIAGNOSIS — E870 Hyperosmolality and hypernatremia: Secondary | ICD-10-CM | POA: Diagnosis not present

## 2018-06-08 DIAGNOSIS — R42 Dizziness and giddiness: Secondary | ICD-10-CM | POA: Diagnosis not present

## 2018-06-08 DIAGNOSIS — R2681 Unsteadiness on feet: Secondary | ICD-10-CM | POA: Diagnosis not present

## 2018-06-15 DIAGNOSIS — R42 Dizziness and giddiness: Secondary | ICD-10-CM | POA: Diagnosis not present

## 2018-06-15 DIAGNOSIS — R2681 Unsteadiness on feet: Secondary | ICD-10-CM | POA: Diagnosis not present

## 2018-06-16 DIAGNOSIS — Z1331 Encounter for screening for depression: Secondary | ICD-10-CM | POA: Diagnosis not present

## 2018-06-16 DIAGNOSIS — Z0000 Encounter for general adult medical examination without abnormal findings: Secondary | ICD-10-CM | POA: Diagnosis not present

## 2018-06-16 DIAGNOSIS — Z139 Encounter for screening, unspecified: Secondary | ICD-10-CM | POA: Diagnosis not present

## 2018-06-22 ENCOUNTER — Telehealth: Payer: Self-pay | Admitting: Cardiology

## 2018-06-22 DIAGNOSIS — R2681 Unsteadiness on feet: Secondary | ICD-10-CM | POA: Diagnosis not present

## 2018-06-22 DIAGNOSIS — R42 Dizziness and giddiness: Secondary | ICD-10-CM | POA: Diagnosis not present

## 2018-06-22 NOTE — Telephone Encounter (Signed)
°*  STAT* If patient is at the pharmacy, call can be transferred to refill team.   1. Which medications need to be refilled? (please list name of each medication and dose if known)Carvadolol   2. Which pharmacy/location (including street and city if local pharmacy) is medication to be sent to?[facility] [place]  3. Do they need a 30 day or 90 day supply? [place]

## 2018-06-23 ENCOUNTER — Other Ambulatory Visit: Payer: Self-pay

## 2018-06-23 ENCOUNTER — Encounter: Payer: Self-pay | Admitting: Sports Medicine

## 2018-06-23 ENCOUNTER — Ambulatory Visit (INDEPENDENT_AMBULATORY_CARE_PROVIDER_SITE_OTHER): Payer: Medicare Other | Admitting: Sports Medicine

## 2018-06-23 VITALS — Temp 96.8°F | Resp 16

## 2018-06-23 DIAGNOSIS — M79675 Pain in left toe(s): Secondary | ICD-10-CM

## 2018-06-23 DIAGNOSIS — B351 Tinea unguium: Secondary | ICD-10-CM | POA: Diagnosis not present

## 2018-06-23 DIAGNOSIS — Z7901 Long term (current) use of anticoagulants: Secondary | ICD-10-CM

## 2018-06-23 DIAGNOSIS — M79674 Pain in right toe(s): Secondary | ICD-10-CM

## 2018-06-23 DIAGNOSIS — I739 Peripheral vascular disease, unspecified: Secondary | ICD-10-CM

## 2018-06-23 DIAGNOSIS — Z872 Personal history of diseases of the skin and subcutaneous tissue: Secondary | ICD-10-CM

## 2018-06-23 MED ORDER — CARVEDILOL 25 MG PO TABS: 25.0000 mg | ORAL_TABLET | Freq: Two times a day (BID) | ORAL | 1 refills | Status: DC

## 2018-06-23 NOTE — Telephone Encounter (Signed)
Carvedilol refill sent to [facility] in [place]

## 2018-06-23 NOTE — Addendum Note (Signed)
Addended by: Polly Cobia A on: 06/23/2018 02:37 PM   Modules accepted: Orders

## 2018-06-23 NOTE — Progress Notes (Signed)
Subjective: Spencer Cochran is a 73 y.o. male patient seen today in office with complaint of mildly painful thickened and elongated toenails; unable to trim. Patient is still on Eliquis blood thinner for history of Cardiac/ A Fib like before.  No other pedal complaints at this time.  FBS 117 x 1 month ago  A1c 5.6   Patient Active Problem List   Diagnosis Date Noted  . Coronary artery disease 10/01/2016  . Dyslipidemia 10/01/2016  . Atrial fibrillation ([place]) 03/08/2014  . Benign prostatic hypertrophy with incomplete bladder emptying 04/20/2013    Current Outpatient Medications on File Prior to Visit  Medication Sig Dispense Refill  . amLODipine-benazepril (LOTREL) 5-40 MG capsule TAKE 1 CAPSULE DAILY 90 capsule 2  . atorvastatin (LIPITOR) 40 MG tablet Take 40 mg by mouth at bedtime.     . carvedilol (COREG) 25 MG tablet Take 25 mg by mouth 2 (two) times daily with a meal.    . ELIQUIS 5 MG TABS tablet TAKE 1 TABLET TWICE A DAY 180 tablet 1  . furosemide (LASIX) 40 MG tablet TAKE 1 TABLET EVERY OTHER DAY 45 tablet 4  . nabumetone (RELAFEN) 500 MG tablet     . nitroGLYCERIN (NITROSTAT) 0.4 MG SL tablet     . omeprazole (PRILOSEC) 20 MG capsule Take 20 mg by mouth every morning.    . timolol (TIMOPTIC) 0.5 % ophthalmic solution 1 drop 2 (two) times daily.    . valACYclovir (VALTREX) 1000 MG tablet Take 1,000 mg by mouth 2 (two) times daily.     No current facility-administered medications on file prior to visit.     Allergies  Allergen Reactions  . Hydrocodone Anxiety    Objective: Physical Exam  General: Well developed, nourished, no acute distress, awake, alert and oriented x 3  Vascular: Dorsalis pedis artery 1/4 bilateral, Posterior tibial artery 0/4 bilateral due to trace edema at ankles bilateral, skin temperature warm to warm proximal to distal bilateral lower extremities, mild varicosities, scant pedal hair present bilateral.  Neurological: Gross sensation present via  light touch bilateral.   Dermatological: Skin is warm, dry, and supple bilateral, Nails 1-10 are tender, long, thick, and discolored with mild subungal debris,  there is mild incurvation at medial and lateral hallux nail margins without any acute signs of infection, no webspace macerations present bilateral, no open lesions present bilateral, no callus/corns/hyperkeratotic tissue present bilateral. No signs of infection bilateral.  Musculoskeletal: Pes planus deformities noted bilateral. Muscular strength within normal limits without painon range of motion. No pain with calf compression bilateral.  Assessment and Plan:  Problem List Items Addressed This Visit    None    Visit Diagnoses    Pain due to onychomycosis of toenails of both feet    -  Primary   History of ingrowing nail       Long term current use of anticoagulant therapy       PVD (peripheral vascular disease) ([place])         -Examined patient.  -Discussed treatment options for painful mycotic nails. -Mechanically debrided and reduced mycotic nails and removed all offending nail borders with sterile nail nipper and dremel nail file without incident. -Advised patient to keep nails filed. If there are any problems with nails may require more aggressive slant back or PNA -Patient to return in 3 months for follow up nail care or sooner if symptoms worsen.  Landis Martins, DPM

## 2018-06-29 DIAGNOSIS — N401 Enlarged prostate with lower urinary tract symptoms: Secondary | ICD-10-CM | POA: Diagnosis not present

## 2018-06-29 DIAGNOSIS — R3912 Poor urinary stream: Secondary | ICD-10-CM | POA: Diagnosis not present

## 2018-06-29 DIAGNOSIS — R972 Elevated prostate specific antigen [PSA]: Secondary | ICD-10-CM | POA: Diagnosis not present

## 2018-06-29 DIAGNOSIS — N200 Calculus of kidney: Secondary | ICD-10-CM | POA: Diagnosis not present

## 2018-06-29 DIAGNOSIS — R8279 Other abnormal findings on microbiological examination of urine: Secondary | ICD-10-CM | POA: Diagnosis not present

## 2018-06-29 DIAGNOSIS — D4959 Neoplasm of unspecified behavior of other genitourinary organ: Secondary | ICD-10-CM | POA: Diagnosis not present

## 2018-06-30 DIAGNOSIS — R2681 Unsteadiness on feet: Secondary | ICD-10-CM | POA: Diagnosis not present

## 2018-06-30 DIAGNOSIS — R42 Dizziness and giddiness: Secondary | ICD-10-CM | POA: Diagnosis not present

## 2018-07-01 DIAGNOSIS — I10 Essential (primary) hypertension: Secondary | ICD-10-CM | POA: Diagnosis not present

## 2018-07-01 DIAGNOSIS — R7303 Prediabetes: Secondary | ICD-10-CM | POA: Diagnosis not present

## 2018-07-01 DIAGNOSIS — E870 Hyperosmolality and hypernatremia: Secondary | ICD-10-CM | POA: Diagnosis not present

## 2018-07-01 DIAGNOSIS — I2510 Atherosclerotic heart disease of native coronary artery without angina pectoris: Secondary | ICD-10-CM | POA: Diagnosis not present

## 2018-07-06 DIAGNOSIS — R2681 Unsteadiness on feet: Secondary | ICD-10-CM | POA: Diagnosis not present

## 2018-07-06 DIAGNOSIS — R42 Dizziness and giddiness: Secondary | ICD-10-CM | POA: Diagnosis not present

## 2018-07-14 ENCOUNTER — Encounter: Payer: Self-pay | Admitting: *Deleted

## 2018-07-18 ENCOUNTER — Other Ambulatory Visit: Payer: Self-pay

## 2018-07-18 ENCOUNTER — Ambulatory Visit (INDEPENDENT_AMBULATORY_CARE_PROVIDER_SITE_OTHER): Payer: Medicare Other | Admitting: Neurology

## 2018-07-18 ENCOUNTER — Encounter: Payer: Self-pay | Admitting: Neurology

## 2018-07-18 ENCOUNTER — Telehealth: Payer: Medicare Other | Admitting: Cardiology

## 2018-07-18 VITALS — BP 126/80 | HR 60 | Temp 89.4°F | Ht 68.0 in | Wt 198.7 lb

## 2018-07-18 DIAGNOSIS — R202 Paresthesia of skin: Secondary | ICD-10-CM | POA: Diagnosis not present

## 2018-07-18 DIAGNOSIS — I4821 Permanent atrial fibrillation: Secondary | ICD-10-CM

## 2018-07-18 HISTORY — DX: Paresthesia of skin: R20.2

## 2018-07-18 NOTE — Progress Notes (Signed)
PATIENT: Spencer Cochran DOB: 1945-10-16  Chief Complaint  Patient presents with   Unsteadiness    He has noticed intermittent balance issues when walking (where he may stumble).  He also has quick moments of dizziness that only last 5-10 seconds.  These events are unaffected by positional changes.  He is currently PT once weekly, at [facility], which seems to be helping.   PCP    Maryella Shivers, MD     HISTORICAL  Miran Kautzman is a 73 year old male, seen in request by his primary care physician Dr. Nyra Capes, [facility] evaluation of bilateral feet and lower extremity paresthesia,  Initial evaluation was on July 17 2000.   I have reviewed and summarized the referring note from the referring physician.  He has PMHx of HTN, HLD, Afib,  He presented with intermittent unsteady sensation since 2003, he described frequent occurrence of feeling jumpy left or right, or up and down, but not vertigo, lasting for few seconds, but can happen every hour, in sitting down, or standing position, it is different from vertigo, it is different from transient lightheadedness when he gets up quickly from seated position,  He was seen Dr. Metta Clines neurologist in the past, reported no significant abnormality on MRA of the brain, or MRI of the brain  He does has mild bilateral feet paresthesia, has gradually increased gait abnormality,   REVIEW OF SYSTEMS: Full 14 system review of systems performed and notable only for as above All other review of systems were negative.  ALLERGIES: Allergies  Allergen Reactions   Hydrocodone Anxiety    HOME MEDICATIONS: Current Outpatient Medications  Medication Sig Dispense Refill   amLODipine-benazepril (LOTREL) 5-40 MG capsule TAKE 1 CAPSULE DAILY 90 capsule 2   atorvastatin (LIPITOR) 40 MG tablet Take 40 mg by mouth at bedtime.      carvedilol (COREG) 25 MG tablet Take 1 tablet (25 mg total) by mouth 2 (two) times daily with a meal. 180 tablet  1   ELIQUIS 5 MG TABS tablet TAKE 1 TABLET TWICE A DAY 180 tablet 1   furosemide (LASIX) 40 MG tablet TAKE 1 TABLET EVERY OTHER DAY 45 tablet 4   nabumetone (RELAFEN) 500 MG tablet      nitroGLYCERIN (NITROSTAT) 0.4 MG SL tablet      omeprazole (PRILOSEC) 20 MG capsule Take 20 mg by mouth every morning.     timolol (TIMOPTIC) 0.5 % ophthalmic solution 1 drop 2 (two) times daily.     valACYclovir (VALTREX) 1000 MG tablet Take 1,000 mg by mouth 2 (two) times daily.     No current facility-administered medications for this visit.     PAST MEDICAL HISTORY: Past Medical History:  Diagnosis Date   Arthritis knees and ankle   Asymptomatic gallstones    Cancer ([place])    skin    Cataract immature BILATERAL EYES   Coronary artery disease CARDIOLOGIST-  DR Agustin Cree  [facility])---  LAST VISIT 04-29-2011   DENIES CARDIAC SYMPTOMS   DDD (degenerative disc disease), lumbar    Dysrhythmia    A Fib    Frequency of urination    GERD (gastroesophageal reflux disease)    Glaucoma    Heart murmur    History of kidney stones    Hypertension    Impaired hearing has bilateral aids--  but does not wear at all times   Left ventricular diastolic dysfunction PER ECHO 03-31-2011  W/ CHART   Nocturia    OSA on CPAP  wears cpap   Pre-diabetes    Unsteadiness    Urethral stricture     PAST SURGICAL HISTORY: Past Surgical History:  Procedure Laterality Date   APPENDECTOMY  1972   CARDIOVASCULAR STRESS TEST  04-29-2010   DR KRASAWSKI   NO EVIDENCE ISCHEMIA/ NORMAL LVSF AND WALL MOTION/ EF 63%   CERVICAL FUSION  2006   C3 - 6   colonscopy      CYSTO/ URETHRAL DILATION/ TRANSURETHRAL INCISIONOF PROSTATE  01-13-2007   CYSTOSCOPY  2005   CYSTOSCOPY W/ RETROGRADES  06/12/2011   Procedure: CYSTOSCOPY WITH RETROGRADE PYELOGRAM;  Surgeon: Ailene Rud, MD;  Location: [place];  Service: Urology;  Laterality: N/A;  cysto, urethral dilation, right  retrograde pyelogram     CYSTOSCOPY WITH RETROGRADE PYELOGRAM, URETEROSCOPY AND STENT PLACEMENT Left 03/08/2014   Procedure: CYSTOSCOPY WITH LEFT RETROGRADE PYELOGRAM/LEFT  URETEROSCOPY;  Surgeon: Ailene Rud, MD;  [facility];  Service: Urology;  Laterality: Left;   CYSTOSCOPY WITH URETHRAL DILATATION N/A 04/20/2013   Procedure: CYSTOSCOPY WITH URETHRAL DILATATION;  Surgeon: Ailene Rud, MD;  [facility];  Service: Urology;  Laterality: N/A;   CYSTOSCOPY/URETEROSCOPY/HOLMIUM LASER/STENT PLACEMENT Left 06/10/2017   Procedure: CYSTOSCOPY/RETROGRADE/URETEROSCOPY/HOLMIUM LASER/STENT PLACEMENT;  Surgeon: Raynelle Bring, MD;  [facility];  Service: Urology;  Laterality: Left;   EXTRACORPOREAL SHOCK WAVE LITHOTRIPSY  01-17-2007   LEFT   HOLMIUM LASER APPLICATION Left 09/06/2776   Procedure: LASER OF LEFT RENAL PELVIC STONE;  Surgeon: Ailene Rud, MD;  [facility];  Service: Urology;  Laterality: Left;   INGUINAL HERNIA REPAIR  2003   JOINT REPLACEMENT     total knee right 03-01-17   LEFT ACHILLES TENDON REPAIR  1992   NASAL SINUS SURGERY  2012   PENILE SURGERY  OF MEATUS  1955   TRANSTHORACIC ECHOCARDIOGRAM  03-31-2011   NORMAL LVEF  59%/ TRIVIAL MR/ DIASTOLIC DYSFUNCTION/ MODERATE LVH   TRANSURETHRAL RESECTION OF PROSTATE N/A 04/20/2013   Procedure: TRANSURETHRAL RESECTION OF THE PROSTATE WITH GYRUS INSTRUMENTS;  Surgeon: Ailene Rud, MD;  [facility];  Service: Urology;  Laterality: N/A;    FAMILY HISTORY: Family History  Problem Relation Age of Onset   Breast cancer Mother    Cirrhosis Father     SOCIAL HISTORY: Social History   Socioeconomic History   Marital status: Married    Spouse name: Not on file   Number of children: 2   Years of education: some college   Highest education level: Not on file  Occupational History   Occupation: retired Research officer, trade union strain: Not on file    Food insecurity    Worry: Not on file    Inability: Not on Lexicographer needs    Medical: Not on file    Non-medical: Not on file  Tobacco Use   Smoking status: Never Smoker   Smokeless tobacco: Never Used  Substance and Sexual Activity   Alcohol use: No   Drug use: No   Sexual activity: Yes  Lifestyle   Physical activity    Days per week: Not on file    Minutes per session: Not on file   Stress: Not on file  Relationships   Social connections    Talks on phone: Not on file    Gets together: Not on file    Attends religious service: Not on file    Active member of club or organization: Not on  file    Attends meetings of clubs or organizations: Not on file    Relationship status: Not on file   Intimate partner violence    Fear of current or ex partner: Not on file    Emotionally abused: Not on file    Physically abused: Not on file    Forced sexual activity: Not on file  Other Topics Concern   Not on file  Social History Narrative   Lives at home with his wife.   Right-handed.   Caffeine use:  32-40 ounces caffeine per day.     PHYSICAL EXAM   Vitals:   07/18/18 1407  BP: 126/80  Pulse: 60  Temp: (!) 89.4 F (31.9 C)  Weight: 198 lb 10.6 oz (90.1 kg)  Height: 5\' 8"  (1.727 m)    Not recorded      Body mass index is 30.21 kg/m.  PHYSICAL EXAMNIATION:  Gen: NAD, conversant, well nourised, obese, well groomed                     Cardiovascular: Regular rate rhythm, no peripheral edema, warm, nontender. Eyes: Conjunctivae clear without exudates or hemorrhage Neck: Supple, no carotid bruits. Pulmonary: Clear to auscultation bilaterally   NEUROLOGICAL EXAM:  MENTAL STATUS: Speech:    Speech is normal; fluent and spontaneous with normal comprehension.  Cognition:     Orientation to time, place and person     Normal recent and remote memory     Normal Attention span and concentration     Normal Language, naming,  repeating,spontaneous speech     Fund of knowledge   CRANIAL NERVES: CN II: Visual fields are full to confrontation. Pupils are round equal and briskly reactive to light. CN III, IV, VI: extraocular movement are normal. No ptosis. CN V: Facial sensation is intact to pinprick in all 3 divisions bilaterally. Corneal responses are intact.  CN VII: Face is symmetric with normal eye closure and smile. CN VIII: Hearing is normal to rubbing fingers CN IX, X: Palate elevates symmetrically. Phonation is normal. CN XI: Head turning and shoulder shrug are intact CN XII: Tongue is midline with normal movements and no atrophy.  MOTOR: There is no pronator drift of out-stretched arms. Muscle bulk and tone are normal. Muscle strength is normal.  REFLEXES: Reflexes are 1 and symmetric at the biceps, triceps, knees, and absent at ankles. Plantar responses are flexor.  SENSORY: Length dependent decreased to light touch, pinprick, and vibratory sensation to distal shin level COORDINATION: Rapid alternating movements and fine finger movements are intact. There is no dysmetria on finger-to-nose and heel-knee-shin.    GAIT/STANCE: He needs pushed up to get up from seated position, mildly cautious gait, difficulty with tandem walking   DIAGNOSTIC DATA (LABS, IMAGING, TESTING) - I reviewed patient records, labs, notes, testing and imaging myself where available.   ASSESSMENT AND PLAN  Niraj Kudrna is a 73 y.o. male   Bilateral feet paresthesia  Most consistent with peripheral neuropathy  EMG nerve conduction study  Laboratory evaluations for potential etiology  Have advised him continue moderate exercise and increase water intake   Marcial Pacas, M.D. Ph.D.  [facility] [address] [place], [place] [address] Ph: [phone] Fax: [phone]  CC: Maryella Shivers, MD

## 2018-07-19 ENCOUNTER — Telehealth: Payer: Self-pay | Admitting: *Deleted

## 2018-07-19 MED ORDER — APIXABAN 5 MG PO TABS: 5.0000 mg | ORAL_TABLET | Freq: Two times a day (BID) | ORAL | 1 refills | Status: DC

## 2018-07-19 NOTE — Telephone Encounter (Signed)
Rx refill sent to pharmacy.  *STAT* If patient is at the pharmacy, call can be transferred to refill team.   1. Which medications need to be refilled? (please list name of each medication and dose if known) Eliquis 5 mg bid  2. Which pharmacy/location (including street and city if local pharmacy) is medication to be sent to?[facility]  3. Do they need a 30 day or 90 day supply? [place]

## 2018-07-20 ENCOUNTER — Telehealth: Payer: Self-pay | Admitting: Cardiology

## 2018-07-20 DIAGNOSIS — R2681 Unsteadiness on feet: Secondary | ICD-10-CM | POA: Diagnosis not present

## 2018-07-20 DIAGNOSIS — R42 Dizziness and giddiness: Secondary | ICD-10-CM | POA: Diagnosis not present

## 2018-07-20 NOTE — Telephone Encounter (Signed)
Left message for patient to return call.

## 2018-07-20 NOTE — Telephone Encounter (Signed)
Patient's wife called back and reports the patient wanted to know if eliquis was going to go generic since it is more expensive. I provided her with the patient assistance number and asked them to call us if they are not able to help. Patient's wife verbally understands. No further questions.

## 2018-07-20 NOTE — Telephone Encounter (Signed)
Wants to know if Eliquis is going generic

## 2018-07-21 ENCOUNTER — Ambulatory Visit: Payer: Medicare Other | Admitting: Neurology

## 2018-07-21 ENCOUNTER — Telehealth: Payer: Self-pay | Admitting: Neurology

## 2018-07-21 LAB — HIV ANTIBODY (ROUTINE TESTING W REFLEX): HIV Screen 4th Generation wRfx: NONREACTIVE

## 2018-07-21 LAB — COMPREHENSIVE METABOLIC PANEL
ALT: 13 IU/L (ref 0–44)
AST: 27 IU/L (ref 0–40)
Albumin/Globulin Ratio: 1.4 (ref 1.2–2.2)
Albumin: 3.9 g/dL (ref 3.7–4.7)
Alkaline Phosphatase: 70 IU/L (ref 39–117)
BUN/Creatinine Ratio: 19 (ref 10–24)
BUN: 16 mg/dL (ref 8–27)
Bilirubin Total: 0.6 mg/dL (ref 0.0–1.2)
CO2: 24 mmol/L (ref 20–29)
Calcium: 9.4 mg/dL (ref 8.6–10.2)
Chloride: 106 mmol/L (ref 96–106)
Creatinine, Ser: 0.86 mg/dL (ref 0.76–1.27)
GFR calc Af Amer: 99 mL/min/{1.73_m2} (ref >59)
GFR calc non Af Amer: 86 mL/min/{1.73_m2} (ref >59)
Globulin, Total: 2.7 g/dL (ref 1.5–4.5)
Glucose: 96 mg/dL (ref 65–99)
Potassium: 3.9 mmol/L (ref 3.5–5.2)
Sodium: 147 mmol/L — ABNORMAL HIGH (ref 134–144)
Total Protein: 6.6 g/dL (ref 6.0–8.5)

## 2018-07-21 LAB — CBC WITH DIFFERENTIAL
Basophils Absolute: 0.1 10*3/uL (ref 0.0–0.2)
Basos: 1 %
EOS (ABSOLUTE): 0.1 10*3/uL (ref 0.0–0.4)
Eos: 2 %
Hematocrit: 43.2 % (ref 37.5–51.0)
Hemoglobin: 14.3 g/dL (ref 13.0–17.7)
Immature Grans (Abs): 0 10*3/uL (ref 0.0–0.1)
Immature Granulocytes: 0 %
Lymphocytes Absolute: 2.7 10*3/uL (ref 0.7–3.1)
Lymphs: 48 %
MCH: 31.8 pg (ref 26.6–33.0)
MCHC: 33.1 g/dL (ref 31.5–35.7)
MCV: 96 fL (ref 79–97)
Monocytes Absolute: 0.5 10*3/uL (ref 0.1–0.9)
Monocytes: 9 %
Neutrophils Absolute: 2.2 10*3/uL (ref 1.4–7.0)
Neutrophils: 40 %
RBC: 4.49 x10E6/uL (ref 4.14–5.80)
RDW: 12.3 % (ref 11.6–15.4)
WBC: 5.6 10*3/uL (ref 3.4–10.8)

## 2018-07-21 LAB — PROTEIN ELECTROPHORESIS
A/G Ratio: 1.2 (ref 0.7–1.7)
Albumin ELP: 3.6 g/dL (ref 2.9–4.4)
Alpha 1: 0.3 g/dL (ref 0.0–0.4)
Alpha 2: 0.7 g/dL (ref 0.4–1.0)
Beta: 1 g/dL (ref 0.7–1.3)
Gamma Globulin: 1.1 g/dL (ref 0.4–1.8)
Globulin, Total: 3 g/dL (ref 2.2–3.9)

## 2018-07-21 LAB — TSH: TSH: 0.647 u[IU]/mL (ref 0.450–4.500)

## 2018-07-21 LAB — HGB A1C W/O EAG: Hgb A1c MFr Bld: 5.8 % — ABNORMAL HIGH (ref 4.8–5.6)

## 2018-07-21 LAB — FERRITIN: Ferritin: 79 ng/mL (ref 30–400)

## 2018-07-21 LAB — VITAMIN B12: Vitamin B-12: 357 pg/mL (ref 232–1245)

## 2018-07-21 LAB — ANA W/REFLEX IF POSITIVE: Anti Nuclear Antibody (ANA): NEGATIVE

## 2018-07-21 LAB — C-REACTIVE PROTEIN: CRP: 3 mg/L (ref 0–10)

## 2018-07-21 LAB — SEDIMENTATION RATE: Sed Rate: 6 mm/hr (ref 0–30)

## 2018-07-21 LAB — FOLATE: Folate: 9.9 ng/mL (ref >3.0)

## 2018-07-21 LAB — CK: Total CK: 102 U/L (ref 41–331)

## 2018-07-21 LAB — RPR: RPR Ser Ql: NONREACTIVE

## 2018-07-21 LAB — COPPER, SERUM: Copper: 105 ug/dL (ref 72–166)

## 2018-07-21 NOTE — Telephone Encounter (Signed)
Left patient a detailed message, with results, on voicemail (ok per DPR).  Provided our number to call back with any questions.  

## 2018-07-21 NOTE — Telephone Encounter (Signed)
Please call patient, extensive laboratory evaluation showed mildly elevated A1c 5.8, rest of the laboratory evaluation showed no significant abnormality, he should control his diet, moderate exercise

## 2018-07-29 DIAGNOSIS — H401131 Primary open-angle glaucoma, bilateral, mild stage: Secondary | ICD-10-CM | POA: Diagnosis not present

## 2018-07-29 DIAGNOSIS — H25043 Posterior subcapsular polar age-related cataract, bilateral: Secondary | ICD-10-CM | POA: Diagnosis not present

## 2018-07-29 DIAGNOSIS — H2511 Age-related nuclear cataract, right eye: Secondary | ICD-10-CM | POA: Diagnosis not present

## 2018-07-29 DIAGNOSIS — H2513 Age-related nuclear cataract, bilateral: Secondary | ICD-10-CM | POA: Diagnosis not present

## 2018-07-29 DIAGNOSIS — H25013 Cortical age-related cataract, bilateral: Secondary | ICD-10-CM | POA: Diagnosis not present

## 2018-08-02 DIAGNOSIS — R7303 Prediabetes: Secondary | ICD-10-CM | POA: Diagnosis not present

## 2018-08-02 DIAGNOSIS — I2510 Atherosclerotic heart disease of native coronary artery without angina pectoris: Secondary | ICD-10-CM | POA: Diagnosis not present

## 2018-08-02 DIAGNOSIS — I10 Essential (primary) hypertension: Secondary | ICD-10-CM | POA: Diagnosis not present

## 2018-08-02 DIAGNOSIS — E870 Hyperosmolality and hypernatremia: Secondary | ICD-10-CM | POA: Diagnosis not present

## 2018-08-03 ENCOUNTER — Ambulatory Visit (INDEPENDENT_AMBULATORY_CARE_PROVIDER_SITE_OTHER): Payer: Medicare Other | Admitting: Neurology

## 2018-08-03 ENCOUNTER — Other Ambulatory Visit: Payer: Self-pay

## 2018-08-03 DIAGNOSIS — R269 Unspecified abnormalities of gait and mobility: Secondary | ICD-10-CM | POA: Insufficient documentation

## 2018-08-03 DIAGNOSIS — R202 Paresthesia of skin: Secondary | ICD-10-CM

## 2018-08-03 DIAGNOSIS — G629 Polyneuropathy, unspecified: Secondary | ICD-10-CM

## 2018-08-03 DIAGNOSIS — I4821 Permanent atrial fibrillation: Secondary | ICD-10-CM

## 2018-08-03 HISTORY — DX: Unspecified abnormalities of gait and mobility: R26.9

## 2018-08-03 NOTE — Progress Notes (Signed)
PATIENT: Spencer Cochran DOB: Jan 20, 1946  No chief complaint on file.    HISTORICAL  Spencer Cochran is a 73 year old male, seen in request by his primary care physician Dr. Nyra Capes, [facility] evaluation of bilateral feet and lower extremity paresthesia,  Initial evaluation was on July 17 2000.   I have reviewed and summarized the referring note from the referring physician.  He has PMHx of HTN, HLD, Afib,  He presented with intermittent unsteady sensation since 2003, he described frequent occurrence of feeling unsteady, veered to left or right, or up and down, but not vertigo, lasting for few seconds, but can happen every hour, in sitting down, or standing position, it is different from vertigo, it is different from transient lightheadedness when he gets up quickly from seated position,  He was seen Dr. Metta Clines neurologist in the past, reported no significant abnormality on MRA of the brain, or MRI of the brain  He does has mild bilateral feet paresthesia, has gradually increased gait abnormality.  UPDATE August 03 2018: He return for EMG/NCS today: Which showed evidence of mild axonal sensorimotor polyneuropathy. Laboratory evaluations in June 2020 showed normal or negative CBC, CMP with exception of mild elevated glucose 147, normal Y07, HIV, RPR, folic acid, C-reactive protein, TSH, CPK, ferritin, copper, ANA, ESR, protein electrophoresis, A1c was elevated 5.8  REVIEW OF SYSTEMS: Full 14 system review of systems performed and notable only for as above All other review of systems were negative.  ALLERGIES: Allergies  Allergen Reactions   Hydrocodone Anxiety    HOME MEDICATIONS: Current Outpatient Medications  Medication Sig Dispense Refill   amLODipine-benazepril (LOTREL) 5-40 MG capsule TAKE 1 CAPSULE DAILY 90 capsule 2   apixaban (ELIQUIS) 5 MG TABS tablet Take 1 tablet (5 mg total) by mouth 2 (two) times daily. 180 tablet 1   atorvastatin (LIPITOR) 40 MG tablet Take  40 mg by mouth at bedtime.      carvedilol (COREG) 25 MG tablet Take 1 tablet (25 mg total) by mouth 2 (two) times daily with a meal. 180 tablet 1   furosemide (LASIX) 40 MG tablet TAKE 1 TABLET EVERY OTHER DAY 45 tablet 4   nabumetone (RELAFEN) 500 MG tablet      nitroGLYCERIN (NITROSTAT) 0.4 MG SL tablet      omeprazole (PRILOSEC) 20 MG capsule Take 20 mg by mouth every morning.     timolol (TIMOPTIC) 0.5 % ophthalmic solution 1 drop 2 (two) times daily.     valACYclovir (VALTREX) 1000 MG tablet Take 1,000 mg by mouth 2 (two) times daily.     No current facility-administered medications for this visit.     PAST MEDICAL HISTORY: Past Medical History:  Diagnosis Date   Arthritis knees and ankle   Asymptomatic gallstones    Cancer ([place])    skin    Cataract immature BILATERAL EYES   Coronary artery disease CARDIOLOGIST-  DR Agustin Cree  [facility])---  LAST VISIT 04-29-2011   DENIES CARDIAC SYMPTOMS   DDD (degenerative disc disease), lumbar    Dysrhythmia    A Fib    Frequency of urination    GERD (gastroesophageal reflux disease)    Glaucoma    Heart murmur    History of kidney stones    Hypertension    Impaired hearing has bilateral aids--  but does not wear at all times   Left ventricular diastolic dysfunction PER ECHO 03-31-2011  W/ CHART   Nocturia    OSA on CPAP    wears  cpap   Pre-diabetes    Unsteadiness    Urethral stricture     PAST SURGICAL HISTORY: Past Surgical History:  Procedure Laterality Date   APPENDECTOMY  1972   CARDIOVASCULAR STRESS TEST  04-29-2010   DR KRASAWSKI   NO EVIDENCE ISCHEMIA/ NORMAL LVSF AND WALL MOTION/ EF 63%   CERVICAL FUSION  2006   C3 - 6   colonscopy      CYSTO/ URETHRAL DILATION/ TRANSURETHRAL INCISIONOF PROSTATE  01-13-2007   CYSTOSCOPY  2005   CYSTOSCOPY W/ RETROGRADES  06/12/2011   Procedure: CYSTOSCOPY WITH RETROGRADE PYELOGRAM;  Surgeon: Ailene Rud, MD;  Location: [place];  Service: Urology;  Laterality: N/A;  cysto, urethral dilation, right retrograde pyelogram     CYSTOSCOPY WITH RETROGRADE PYELOGRAM, URETEROSCOPY AND STENT PLACEMENT Left 03/08/2014   Procedure: CYSTOSCOPY WITH LEFT RETROGRADE PYELOGRAM/LEFT  URETEROSCOPY;  Surgeon: Ailene Rud, MD;  Location: [facility];  Service: Urology;  Laterality: Left;   CYSTOSCOPY WITH URETHRAL DILATATION N/A 04/20/2013   Procedure: CYSTOSCOPY WITH URETHRAL DILATATION;  Surgeon: Ailene Rud, MD;  Location: [facility];  Service: Urology;  Laterality: N/A;   CYSTOSCOPY/URETEROSCOPY/HOLMIUM LASER/STENT PLACEMENT Left 06/10/2017   Procedure: CYSTOSCOPY/RETROGRADE/URETEROSCOPY/HOLMIUM LASER/STENT PLACEMENT;  Surgeon: Raynelle Bring, MD;  Location: [facility];  Service: Urology;  Laterality: Left;   EXTRACORPOREAL SHOCK WAVE LITHOTRIPSY  01-17-2007   LEFT   HOLMIUM LASER APPLICATION Left 09/10/3808   Procedure: LASER OF LEFT RENAL PELVIC STONE;  Surgeon: Ailene Rud, MD;  Location: [facility];  Service: Urology;  Laterality: Left;   INGUINAL HERNIA REPAIR  2003   JOINT REPLACEMENT     total knee right 03-01-17   LEFT ACHILLES TENDON REPAIR  1992   NASAL SINUS SURGERY  2012   PENILE SURGERY  OF MEATUS  1955   TRANSTHORACIC ECHOCARDIOGRAM  03-31-2011   NORMAL LVEF  59%/ TRIVIAL MR/ DIASTOLIC DYSFUNCTION/ MODERATE LVH   TRANSURETHRAL RESECTION OF PROSTATE N/A 04/20/2013   Procedure: TRANSURETHRAL RESECTION OF THE PROSTATE WITH GYRUS INSTRUMENTS;  Surgeon: Ailene Rud, MD;  Location: [facility];  Service: Urology;  Laterality: N/A;    FAMILY HISTORY: Family History  Problem Relation Age of Onset   Breast cancer Mother    Cirrhosis Father     SOCIAL HISTORY: Social History   Socioeconomic History   Marital status: Married    Spouse name: Not on file   Number of children: 2   Years of education: some college   Highest education level: Not on file  Occupational History    Occupation: retired Research officer, trade union strain: Not on file   Food insecurity    Worry: Not on file    Inability: Not on Lexicographer needs    Medical: Not on file    Non-medical: Not on file  Tobacco Use   Smoking status: Never Smoker   Smokeless tobacco: Never Used  Substance and Sexual Activity   Alcohol use: No   Drug use: No   Sexual activity: Yes  Lifestyle   Physical activity    Days per week: Not on file    Minutes per session: Not on file   Stress: Not on file  Relationships   Social connections    Talks on phone: Not on file    Gets together: Not on file    Attends religious service: Not on file    Active member of club or organization: Not on file  Attends meetings of clubs or organizations: Not on file    Relationship status: Not on file   Intimate partner violence    Fear of current or ex partner: Not on file    Emotionally abused: Not on file    Physically abused: Not on file    Forced sexual activity: Not on file  Other Topics Concern   Not on file  Social History Narrative   Lives at home with his wife.   Right-handed.   Caffeine use:  32-40 ounces caffeine per day.     PHYSICAL EXAM   There were no vitals filed for this visit.  Not recorded      There is no height or weight on file to calculate BMI.  PHYSICAL EXAMNIATION:  Gen: NAD, conversant, well nourised, obese, well groomed                     Cardiovascular: Regular rate rhythm, no peripheral edema, warm, nontender. Eyes: Conjunctivae clear without exudates or hemorrhage Neck: Supple, no carotid bruits. Pulmonary: Clear to auscultation bilaterally   NEUROLOGICAL EXAM:  MENTAL STATUS: Speech:    Speech is normal; fluent and spontaneous with normal comprehension.  Cognition:     Orientation to time, place and person     Normal recent and remote memory     Normal Attention span and concentration     Normal Language, naming,  repeating,spontaneous speech     Fund of knowledge   CRANIAL NERVES: CN II: Visual fields are full to confrontation. Pupils are round equal and briskly reactive to light. CN III, IV, VI: extraocular movement are normal. No ptosis. CN V: Facial sensation is intact to pinprick in all 3 divisions bilaterally. Corneal responses are intact.  CN VII: Face is symmetric with normal eye closure and smile. CN VIII: Hearing is normal to rubbing fingers CN IX, X: Palate elevates symmetrically. Phonation is normal. CN XI: Head turning and shoulder shrug are intact CN XII: Tongue is midline with normal movements and no atrophy.  MOTOR: There is no pronator drift of out-stretched arms. Muscle bulk and tone are normal. Muscle strength is normal.  REFLEXES: Reflexes are 1 and symmetric at the biceps, triceps, knees, and absent at ankles. Plantar responses are flexor.  SENSORY: Length dependent decreased to light touch, pinprick, and vibratory sensation to distal shin level  COORDINATION: Rapid alternating movements and fine finger movements are intact. There is no dysmetria on finger-to-nose and heel-knee-shin.    GAIT/STANCE: He needs pushed up to get up from seated position, mildly cautious gait, difficulty with tandem walking, bilateral flat feet   DIAGNOSTIC DATA (LABS, IMAGING, TESTING) - I reviewed patient records, labs, notes, testing and imaging myself where available.   ASSESSMENT AND PLAN  Spencer Cochran is a 73 y.o. male   Bilateral feet paresthesia  EMG nerve conduction study confirmed mild axonal peripheral neuropathy  Laboratory evaluations showed elevated A1c 5.8  Have advised him continue moderate exercise and increase water intake  Refer to physical therapy   Marcial Pacas, M.D. Ph.D.  [facility] [address], [place], [place] [address] Ph: [phone] Fax: [phone]  CC: Maryella Shivers, MD

## 2018-08-03 NOTE — Procedures (Signed)
Full Name: Spencer Cochran Gender: Male MRN #: [id number] Date of Birth: [birth year]    Visit Date: 08/03/2018 08:49 Age: 73 Years 69 Months Old Examining Physician: Marcial Pacas, MD  Referring Physician: Marcial Pacas, MD History: 73 year old male, with history of bilateral feet paresthesia unsteady gait  Summary of the test.  Nerve conduction study:  Bilateral sural, superficial peroneal sensory responses were absent.  Right ulnar sensory response showed mildly prolonged peak latency with significantly decreased snap amplitude.  Right ulnar motor responses were normal.  Right peroneal to EDB, tibial motor responses were normal.  Left peroneal to EDB showed mildly decreased C map amplitude.  Left tibial motor responses showed significantly decreased the C map amplitude.  Electromyography:  Selective needle examinations were performed at bilateral lower extremity muscles and bilateral lumbosacral paraspinal muscles.  The only abnormality is mildly enlarged motor unit potential with decreased recruitment at right abductor hallucis  Conclusion:  This is an abnormal study.  There is electrodiagnostic evidence of length dependent mild normal sensorimotor neuropathy.  There is no evidence of bilateral lumbosacral radiculopathy.   ------------------------------- Marcial Pacas, M.D. PhD  [facility] [place], [place] [address] Tel: [phone] Fax: [phone]        [facility]    Nerve / Sites Muscle Latency Ref. Amplitude Ref. Rel Amp Segments Distance Velocity Ref. Area    ms ms mV mV %  cm m/s m/s mVms  R Ulnar - ADM     Wrist ADM 3.1 ?3.3 6.2 ?6.0 100 Wrist - ADM 7   20.0     B.Elbow ADM 6.7  4.8  76.5 B.Elbow - Wrist 20 56 ?49 17.1     A.Elbow ADM 8.7  3.8  80.5 A.Elbow - B.Elbow 10 49 ?49 13.0         A.Elbow - Wrist      R Peroneal - EDB     Ankle EDB 6.3 ?6.5 3.2 ?2.0 100 Ankle - EDB 9   15.3     Fib head EDB 13.4  3.3  102 Fib head - Ankle 29 41  ?44 15.9     Pop fossa EDB 15.8  3.2  97.3 Pop fossa - Fib head 10 41 ?44 15.6         Pop fossa - Ankle      L Peroneal - EDB     Ankle EDB 7.4 ?6.5 1.9 ?2.0 100 Ankle - EDB 9   7.9     Fib head EDB 14.3  1.7  88 Fib head - Ankle 29 42 ?44 7.5     Pop fossa EDB 16.8  1.7  102 Pop fossa - Fib head 10 41 ?44 7.8         Pop fossa - Ankle      R Tibial - AH     Ankle AH 4.3 ?5.8 2.1 ?4.0 100 Ankle - AH 9   8.2     Pop fossa AH 15.3  1.9  94.7 Pop fossa - Ankle 37 34 ?41 6.4  L Tibial - AH     Ankle AH 4.0 ?5.8 0.9 ?4.0 100 Ankle - AH 9   3.3     Pop fossa AH 15.2  1.1  120 Pop fossa - Ankle 38 34 ?41 3.8               SNC    Nerve / Sites Rec. Site Peak Lat Ref.  Amp  Ref. Segments Distance    ms ms V V  cm  R Radial - Anatomical snuff box (Forearm)     Forearm Wrist 3.1 ?2.9 9 ?15 Forearm - Wrist 10  R Sural - Ankle (Calf)     Calf Ankle NR ?4.4 NR ?6 Calf - Ankle 14  L Sural - Ankle (Calf)     Calf Ankle NR ?4.4 NR ?6 Calf - Ankle 14  R Superficial peroneal - Ankle     Lat leg Ankle NR ?4.4 NR ?6 Lat leg - Ankle 14  L Superficial peroneal - Ankle     Lat leg Ankle NR ?4.4 NR ?6 Lat leg - Ankle 14  R Ulnar - Orthodromic, (Dig V, Mid palm)     Dig V Wrist 3.4 ?3.1 1 ?5 Dig V - Wrist 56                 F  Wave    Nerve F Lat Ref.   ms ms  R Tibial - AH 59.8 ?56.0  L Tibial - AH 60.6 ?56.0  R Ulnar - ADM 28.4 ?32.0           EMG       EMG Summary Table    Spontaneous MUAP Recruitment  Muscle IA Fib PSW Fasc Other Amp Dur. Poly Pattern  R. Tibialis anterior Normal None None None _______ Normal Normal Normal Normal  R. Peroneus longus Normal None None None _______ Normal Normal Normal Normal  R. Tibialis posterior Normal None None None _______ Normal Normal Normal Normal  R. Gastrocnemius (Medial head) Normal None None None _______ Normal Normal Normal Normal  R. Vastus lateralis Normal None None None _______ Normal Normal Normal Normal  R. Abductor hallucis Increased  None None None _______ Normal Increased 1+ Reduced  L. Tibialis anterior Normal None None None _______ Normal Normal Normal Normal  L. Peroneus longus Normal None None None _______ Normal Normal Normal Normal  L. Vastus lateralis Normal None None None _______ Normal Normal Normal Normal  L. Tibialis posterior Normal None None None _______ Normal Normal Normal Normal  L. Lumbar paraspinals (mid) Normal None None None _______ Normal Normal Normal Normal  L. Lumbar paraspinals (low) Normal None None None _______ Normal Normal Normal Normal  R. Lumbar paraspinals (low) Normal None None None _______ Normal Normal Normal Normal  R. Lumbar paraspinals (mid) Normal None None None _______ Normal Normal Normal Normal

## 2018-08-04 DIAGNOSIS — R2681 Unsteadiness on feet: Secondary | ICD-10-CM | POA: Diagnosis not present

## 2018-08-04 DIAGNOSIS — R42 Dizziness and giddiness: Secondary | ICD-10-CM | POA: Diagnosis not present

## 2018-08-15 DIAGNOSIS — R972 Elevated prostate specific antigen [PSA]: Secondary | ICD-10-CM | POA: Diagnosis not present

## 2018-08-15 DIAGNOSIS — N485 Ulcer of penis: Secondary | ICD-10-CM | POA: Diagnosis not present

## 2018-08-15 DIAGNOSIS — D4959 Neoplasm of unspecified behavior of other genitourinary organ: Secondary | ICD-10-CM | POA: Diagnosis not present

## 2018-08-15 DIAGNOSIS — R8279 Other abnormal findings on microbiological examination of urine: Secondary | ICD-10-CM | POA: Diagnosis not present

## 2018-08-18 DIAGNOSIS — L570 Actinic keratosis: Secondary | ICD-10-CM | POA: Diagnosis not present

## 2018-08-18 DIAGNOSIS — L578 Other skin changes due to chronic exposure to nonionizing radiation: Secondary | ICD-10-CM | POA: Diagnosis not present

## 2018-08-18 DIAGNOSIS — L821 Other seborrheic keratosis: Secondary | ICD-10-CM | POA: Diagnosis not present

## 2018-09-02 DIAGNOSIS — I10 Essential (primary) hypertension: Secondary | ICD-10-CM | POA: Diagnosis not present

## 2018-09-02 DIAGNOSIS — E870 Hyperosmolality and hypernatremia: Secondary | ICD-10-CM | POA: Diagnosis not present

## 2018-09-02 DIAGNOSIS — I2510 Atherosclerotic heart disease of native coronary artery without angina pectoris: Secondary | ICD-10-CM | POA: Diagnosis not present

## 2018-09-02 DIAGNOSIS — R7303 Prediabetes: Secondary | ICD-10-CM | POA: Diagnosis not present

## 2018-09-16 ENCOUNTER — Other Ambulatory Visit: Payer: Self-pay | Admitting: Cardiology

## 2018-09-19 DIAGNOSIS — H25811 Combined forms of age-related cataract, right eye: Secondary | ICD-10-CM | POA: Diagnosis not present

## 2018-09-19 DIAGNOSIS — Z9841 Cataract extraction status, right eye: Secondary | ICD-10-CM | POA: Diagnosis not present

## 2018-09-19 DIAGNOSIS — H2511 Age-related nuclear cataract, right eye: Secondary | ICD-10-CM | POA: Diagnosis not present

## 2018-09-19 DIAGNOSIS — Z961 Presence of intraocular lens: Secondary | ICD-10-CM | POA: Diagnosis not present

## 2018-09-20 DIAGNOSIS — H2512 Age-related nuclear cataract, left eye: Secondary | ICD-10-CM | POA: Diagnosis not present

## 2018-09-22 ENCOUNTER — Other Ambulatory Visit: Payer: Self-pay

## 2018-09-22 ENCOUNTER — Encounter: Payer: Self-pay | Admitting: Sports Medicine

## 2018-09-22 ENCOUNTER — Ambulatory Visit (INDEPENDENT_AMBULATORY_CARE_PROVIDER_SITE_OTHER): Payer: Medicare Other | Admitting: Sports Medicine

## 2018-09-22 VITALS — Temp 98.0°F | Resp 16

## 2018-09-22 DIAGNOSIS — M79674 Pain in right toe(s): Secondary | ICD-10-CM | POA: Diagnosis not present

## 2018-09-22 DIAGNOSIS — I739 Peripheral vascular disease, unspecified: Secondary | ICD-10-CM

## 2018-09-22 DIAGNOSIS — Z872 Personal history of diseases of the skin and subcutaneous tissue: Secondary | ICD-10-CM

## 2018-09-22 DIAGNOSIS — M79675 Pain in left toe(s): Secondary | ICD-10-CM

## 2018-09-22 DIAGNOSIS — Z7901 Long term (current) use of anticoagulants: Secondary | ICD-10-CM

## 2018-09-22 DIAGNOSIS — B351 Tinea unguium: Secondary | ICD-10-CM

## 2018-09-22 NOTE — Progress Notes (Signed)
Subjective: Spencer Cochran is a 73 y.o. male patient seen today in office with complaint of mildly painful thickened and elongated toenails; unable to trim. Patient is still on Eliquis blood thinner for history of Cardiac/ A Fib like before with no changes in this medication.  No other pedal complaints at this time.  FBS 130 yesterday diet controlled A1c 5.8  Patient Active Problem List   Diagnosis Date Noted  . Gait abnormality 08/03/2018  . Paresthesia 07/18/2018  . Coronary artery disease 10/01/2016  . Dyslipidemia 10/01/2016  . Atrial fibrillation ([place]) 03/08/2014  . Benign prostatic hypertrophy with incomplete bladder emptying 04/20/2013    Current Outpatient Medications on File Prior to Visit  Medication Sig Dispense Refill  . amLODipine-benazepril (LOTREL) 5-40 MG capsule TAKE 1 CAPSULE DAILY 90 capsule 2  . apixaban (ELIQUIS) 5 MG TABS tablet Take 1 tablet (5 mg total) by mouth 2 (two) times daily. 180 tablet 1  . atorvastatin (LIPITOR) 40 MG tablet Take 40 mg by mouth at bedtime.     . carvedilol (COREG) 25 MG tablet Take 1 tablet (25 mg total) by mouth 2 (two) times daily with a meal. 180 tablet 1  . furosemide (LASIX) 40 MG tablet TAKE 1 TABLET EVERY OTHER DAY 45 tablet 4  . nabumetone (RELAFEN) 500 MG tablet     . nitroGLYCERIN (NITROSTAT) 0.4 MG SL tablet     . omeprazole (PRILOSEC) 20 MG capsule Take 20 mg by mouth every morning.    . timolol (TIMOPTIC) 0.5 % ophthalmic solution 1 drop 2 (two) times daily.    . valACYclovir (VALTREX) 1000 MG tablet Take 1,000 mg by mouth 2 (two) times daily.     No current facility-administered medications on file prior to visit.     Allergies  Allergen Reactions  . Hydrocodone Anxiety    Objective: Physical Exam  General: Well developed, nourished, no acute distress, awake, alert and oriented x 3  Vascular: Dorsalis pedis artery 1/4 bilateral, Posterior tibial artery 0/4 bilateral due to trace edema at ankles bilateral, skin  temperature warm to warm proximal to distal bilateral lower extremities, mild varicosities, scant pedal hair present bilateral.  Neurological: Gross sensation present via light touch bilateral.   Dermatological: Skin is warm, dry, and supple bilateral, Nails 1-10 are tender, long, thick, and discolored with mild subungal debris,  there is mild incurvation at medial and lateral hallux nail margins without any acute signs of infection, no webspace macerations present bilateral, no open lesions present bilateral, no callus/corns/hyperkeratotic tissue present bilateral. No signs of infection bilateral.  Musculoskeletal: Pes planus deformities noted bilateral. Muscular strength within normal limits without painon range of motion. No pain with calf compression bilateral.  Assessment and Plan:  Problem List Items Addressed This Visit    None    Visit Diagnoses    Pain due to onychomycosis of toenails of both feet    -  Primary   History of ingrowing nail       Long term current use of anticoagulant therapy       PVD (peripheral vascular disease) ([place])         -Examined patient.  -Discussed treatment options for painful mycotic nails. -Mechanically debrided and reduced mycotic nails and removed all offending nail borders with sterile nail nipper and dremel nail file without incident. -Patient to return in 3 months for follow up nail care or sooner if symptoms worsen.  Landis Martins, DPM

## 2018-09-28 DIAGNOSIS — N485 Ulcer of penis: Secondary | ICD-10-CM | POA: Diagnosis not present

## 2018-09-28 DIAGNOSIS — D4959 Neoplasm of unspecified behavior of other genitourinary organ: Secondary | ICD-10-CM | POA: Diagnosis not present

## 2018-09-28 DIAGNOSIS — R8279 Other abnormal findings on microbiological examination of urine: Secondary | ICD-10-CM | POA: Diagnosis not present

## 2018-10-03 DIAGNOSIS — Z961 Presence of intraocular lens: Secondary | ICD-10-CM | POA: Diagnosis not present

## 2018-10-03 DIAGNOSIS — I10 Essential (primary) hypertension: Secondary | ICD-10-CM | POA: Diagnosis not present

## 2018-10-03 DIAGNOSIS — I2510 Atherosclerotic heart disease of native coronary artery without angina pectoris: Secondary | ICD-10-CM | POA: Diagnosis not present

## 2018-10-03 DIAGNOSIS — H52221 Regular astigmatism, right eye: Secondary | ICD-10-CM | POA: Diagnosis not present

## 2018-10-03 DIAGNOSIS — R7303 Prediabetes: Secondary | ICD-10-CM | POA: Diagnosis not present

## 2018-10-03 DIAGNOSIS — E870 Hyperosmolality and hypernatremia: Secondary | ICD-10-CM | POA: Diagnosis not present

## 2018-10-03 DIAGNOSIS — H25812 Combined forms of age-related cataract, left eye: Secondary | ICD-10-CM | POA: Diagnosis not present

## 2018-10-03 DIAGNOSIS — H2512 Age-related nuclear cataract, left eye: Secondary | ICD-10-CM | POA: Diagnosis not present

## 2018-10-03 DIAGNOSIS — H5211 Myopia, right eye: Secondary | ICD-10-CM | POA: Diagnosis not present

## 2018-10-03 DIAGNOSIS — Z9849 Cataract extraction status, unspecified eye: Secondary | ICD-10-CM | POA: Diagnosis not present

## 2018-10-12 ENCOUNTER — Other Ambulatory Visit: Payer: Self-pay

## 2018-10-12 MED ORDER — CARVEDILOL 25 MG PO TABS: 25.0000 mg | ORAL_TABLET | Freq: Two times a day (BID) | ORAL | 0 refills | Status: DC

## 2018-10-19 DIAGNOSIS — D4959 Neoplasm of unspecified behavior of other genitourinary organ: Secondary | ICD-10-CM | POA: Diagnosis not present

## 2018-10-19 DIAGNOSIS — R8271 Bacteriuria: Secondary | ICD-10-CM | POA: Diagnosis not present

## 2018-10-21 DIAGNOSIS — Z23 Encounter for immunization: Secondary | ICD-10-CM | POA: Diagnosis not present

## 2018-11-01 ENCOUNTER — Other Ambulatory Visit: Payer: Self-pay | Admitting: Urology

## 2018-11-02 DIAGNOSIS — I2510 Atherosclerotic heart disease of native coronary artery without angina pectoris: Secondary | ICD-10-CM | POA: Diagnosis not present

## 2018-11-02 DIAGNOSIS — R7303 Prediabetes: Secondary | ICD-10-CM | POA: Diagnosis not present

## 2018-11-02 DIAGNOSIS — I10 Essential (primary) hypertension: Secondary | ICD-10-CM | POA: Diagnosis not present

## 2018-11-02 DIAGNOSIS — E870 Hyperosmolality and hypernatremia: Secondary | ICD-10-CM | POA: Diagnosis not present

## 2018-11-03 ENCOUNTER — Other Ambulatory Visit: Payer: Self-pay

## 2018-11-03 ENCOUNTER — Encounter: Payer: Self-pay | Admitting: Cardiology

## 2018-11-03 ENCOUNTER — Ambulatory Visit (INDEPENDENT_AMBULATORY_CARE_PROVIDER_SITE_OTHER): Payer: Medicare Other | Admitting: Cardiology

## 2018-11-03 VITALS — BP 110/70 | HR 85 | Ht 68.0 in | Wt 199.0 lb

## 2018-11-03 DIAGNOSIS — I2510 Atherosclerotic heart disease of native coronary artery without angina pectoris: Secondary | ICD-10-CM | POA: Diagnosis not present

## 2018-11-03 DIAGNOSIS — E785 Hyperlipidemia, unspecified: Secondary | ICD-10-CM | POA: Diagnosis not present

## 2018-11-03 DIAGNOSIS — I4821 Permanent atrial fibrillation: Secondary | ICD-10-CM

## 2018-11-03 NOTE — Progress Notes (Signed)
Cardiology Office Note:    Date:  11/03/2018   ID:  Spencer Cochran, DOB 1945-06-04, MRN [id number]  PCP:  Spencer Shivers, MD  Cardiologist:  Spencer Campus, MD    Referring MD: Spencer Shivers, MD   Chief Complaint  Patient presents with  . Follow-up  Doing well  History of Present Illness:    Spencer Cochran is a 73 y.o. male with permanent atrial fibrillation, remote history of coronary artery disease, essential hypertension, dyslipidemia.  Comes today to my office for follow-up.  Overall doing well denies have any chest pain, tightness, pressure, burning in the chest.  Used to exercise on a regular basis but because of coronavirus stop and now have difficulty coming back to sepsis.  He gets easily tired and short of breath.  He was also recently diagnosed with cancer required some biopsy.  Past Medical History:  Diagnosis Date  . Arthritis knees and ankle  . Asymptomatic gallstones   . Cancer ([place])    skin   . Cataract immature BILATERAL EYES  . Coronary artery disease CARDIOLOGIST-  DR Spencer Cochran  Tia Alert)---  LAST VISIT 04-29-2011   DENIES CARDIAC SYMPTOMS  . DDD (degenerative disc disease), lumbar   . Dysrhythmia    A Fib   . Frequency of urination   . GERD (gastroesophageal reflux disease)   . Glaucoma   . Heart murmur   . History of kidney stones   . Hypertension   . 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 Left ventricular diastolic dysfunction PER ECHO 03-31-2011  W/ CHART  . Nocturia   . OSA on CPAP    wears cpap  . Pre-diabetes   . Unsteadiness   . Urethral stricture     Past Surgical History:  Procedure Laterality Date  . APPENDECTOMY  1972  . CARDIOVASCULAR STRESS TEST  04-29-2010   DR KRASAWSKI   NO EVIDENCE ISCHEMIA/ NORMAL LVSF AND WALL MOTION/ EF 63%  . CERVICAL FUSION  2006   C3 - 6  . colonscopy     . CYSTO/ URETHRAL DILATION/ TRANSURETHRAL INCISIONOF PROSTATE  01-13-2007  . CYSTOSCOPY  2005  .  CYSTOSCOPY W/ RETROGRADES  06/12/2011   Procedure: CYSTOSCOPY WITH RETROGRADE PYELOGRAM;  Surgeon: Ailene Rud, MD;  [facility];  Service: Urology;  Laterality: N/A;  cysto, urethral dilation, right retrograde pyelogram    . CYSTOSCOPY WITH RETROGRADE PYELOGRAM, URETEROSCOPY AND STENT PLACEMENT Left 03/08/2014   Procedure: CYSTOSCOPY WITH LEFT RETROGRADE PYELOGRAM/LEFT  URETEROSCOPY;  Surgeon: Ailene Rud, MD;  [facility];  Service: Urology;  Laterality: Left;  . CYSTOSCOPY WITH URETHRAL DILATATION N/A 04/20/2013   Procedure: CYSTOSCOPY WITH URETHRAL DILATATION;  Surgeon: Ailene Rud, MD;  [facility];  Service: Urology;  Laterality: N/A;  . CYSTOSCOPY/URETEROSCOPY/HOLMIUM LASER/STENT PLACEMENT Left 06/10/2017   Procedure: CYSTOSCOPY/RETROGRADE/URETEROSCOPY/HOLMIUM LASER/STENT PLACEMENT;  Surgeon: Raynelle Bring, MD;  [facility];  Service: Urology;  Laterality: Left;  . EXTRACORPOREAL SHOCK WAVE LITHOTRIPSY  01-17-2007   LEFT  . HOLMIUM LASER APPLICATION Left [id number]   Procedure: LASER OF LEFT RENAL PELVIC STONE;  Surgeon: Ailene Rud, MD;  [facility];  Service: Urology;  Laterality: Left;  . INGUINAL HERNIA REPAIR  2003  . JOINT REPLACEMENT     total knee right 03-01-17  . LEFT ACHILLES TENDON REPAIR  1992  . NASAL SINUS SURGERY  2012  . PENILE SURGERY  OF MEATUS  1955  . TRANSTHORACIC  ECHOCARDIOGRAM  03-31-2011   NORMAL LVEF  59%/ TRIVIAL MR/ DIASTOLIC DYSFUNCTION/ MODERATE LVH  . TRANSURETHRAL RESECTION OF PROSTATE N/A 04/20/2013   Procedure: TRANSURETHRAL RESECTION OF THE PROSTATE WITH GYRUS INSTRUMENTS;  Surgeon: Ailene Rud, MD;  [facility];  Service: Urology;  Laterality: N/A;    Current Medications: Current Meds  Medication Sig  . acetaminophen (TYLENOL) 650 MG CR tablet Take 1,300 mg by mouth daily as needed for pain.  Spencer Cochran amLODipine-benazepril (LOTREL) 5-40 MG capsule TAKE 1 CAPSULE DAILY  (Patient taking differently: Take 1 capsule by mouth at bedtime. )  . apixaban (ELIQUIS) 5 MG TABS tablet Take 1 tablet (5 mg total) by mouth 2 (two) times daily.  Spencer Cochran atorvastatin (LIPITOR) 40 MG tablet Take 40 mg by mouth at bedtime.   . carvedilol (COREG) 25 MG tablet Take 1 tablet (25 mg total) by mouth 2 (two) times daily with a meal.  . ferrous sulfate 325 (65 FE) MG tablet Take 325 mg by mouth daily.  . furosemide (LASIX) 40 MG tablet TAKE 1 TABLET EVERY OTHER DAY  . nabumetone (RELAFEN) 500 MG tablet Take 500 mg by mouth 2 (two) times daily as needed (pain.).   Spencer Cochran naproxen sodium (ALEVE) 220 MG tablet Take 220 mg by mouth daily as needed (pain.).  Spencer Cochran nitroGLYCERIN (NITROSTAT) 0.4 MG SL tablet Place 0.4 mg under the tongue every 5 (five) minutes x 3 doses as needed for chest pain.   . Omega-3 Fatty Acids (OMEGA 3 PO) Take 520 mg by mouth daily.  Spencer Cochran omeprazole (PRILOSEC) 20 MG capsule Take 20 mg by mouth daily before breakfast.   . timolol (TIMOPTIC) 0.5 % ophthalmic solution Place 1 drop into both eyes 2 (two) times daily.   Spencer Cochran triamcinolone cream (KENALOG) 0.1 % Apply 1 application topically daily as needed (skin irritation).      Allergies:   Hydrocodone   Social History   Socioeconomic History  . Marital status: Married    Spouse name: Not on file  . Number of children: 2  . Years of education: some college  . Highest education level: Not on file  Occupational History  . Occupation: retired Microbiologist  . Financial resource strain: Not on file  . Food insecurity    Worry: Not on file    Inability: Not on file  . Transportation needs    Medical: Not on file    Non-medical: Not on file  Tobacco Use  . Smoking status: Never Smoker  . Smokeless tobacco: Never Used  Substance and Sexual Activity  . Alcohol use: No  . Drug use: No  . Sexual activity: Yes  Lifestyle  . Physical activity    Days per week: Not on file    Minutes per session: Not on file  . Stress:  Not on file  Relationships  . Social Herbalist on phone: Not on file    Gets together: Not on file    Attends religious service: Not on file    Active member of club or organization: Not on file    Attends meetings of clubs or organizations: Not on file    Relationship status: Not on file  Other Topics Concern  . Not on file  Social History Narrative   Lives at home with his wife.   Right-handed.   Caffeine use:  32-40 ounces caffeine per day.     Family History: The patient's family history includes Breast cancer in  his mother; Cirrhosis in his father. ROS:   Please see the history of present illness.    All 14 point review of systems negative except as described per history of present illness  EKGs/Labs/Other Studies Reviewed:      Recent Labs: 07/18/2018: ALT 13; BUN 16; Creatinine, Ser 0.86; Hemoglobin 14.3; Potassium 3.9; Sodium 147; TSH 0.647  Recent Lipid Panel No results found for: CHOL, TRIG, HDL, CHOLHDL, VLDL, LDLCALC, LDLDIRECT  Physical Exam:    VS:  BP 110/70   Pulse 85   Ht 5\' 8"  (1.727 m)   Wt 199 lb (90.3 kg)   SpO2 98%   BMI 30.26 kg/m     Wt Readings from Last 3 Encounters:  11/03/18 199 lb (90.3 kg)  07/18/18 198 lb 10.6 oz (90.1 kg)  06/01/18 195 lb (88.5 kg)     GEN:  Well nourished, well developed in no acute distress HEENT: Normal NECK: No JVD; No carotid bruits LYMPHATICS: No lymphadenopathy CARDIAC: RRR, no murmurs, no rubs, no gallops RESPIRATORY:  Clear to auscultation without rales, wheezing or rhonchi  ABDOMEN: Soft, non-tender, non-distended MUSCULOSKELETAL:  No edema; No deformity  SKIN: Warm and dry LOWER EXTREMITIES: no swelling NEUROLOGIC:  Alert and oriented x 3 PSYCHIATRIC:  Normal affect   ASSESSMENT:    No diagnosis found. PLAN:    In order of problems listed above:  1. Coronary artery disease remote history of asymptomatic we will continue present management. 2. Permanent atrial fibrillation  anticoagulated which I will continue.  His chads 2 Vascor equals 3. 3. Essential hypertension blood pressure well controlled continue present management. 4. Dyslipidemia followed by internal medicine team apparently stable will call primary care physician to get a copy of the report.   Medication Adjustments/Labs and Tests Ordered: Current medicines are reviewed at length with the patient today.  Concerns regarding medicines are outlined above.  No orders of the defined types were placed in this encounter.  Medication changes: No orders of the defined types were placed in this encounter.   Signed, Park Liter, MD, [facility] 11/03/2018 1:41 PM    [place]

## 2018-11-03 NOTE — Patient Instructions (Signed)
Medication Instructions:  Your physician recommends that you continue on your current medications as directed. Please refer to the Current Medication list given to you today.  If you need a refill on your cardiac medications before your next appointment, please call your pharmacy.   Lab work: None.  If you have labs (blood work) drawn today and your tests are completely normal, you will receive your results only by: . MyChart Message (if you have MyChart) OR . A paper copy in the mail If you have any lab test that is abnormal or we need to change your treatment, we will call you to review the results.  Testing/Procedures: Your physician has requested that you have an echocardiogram. Echocardiography is a painless test that uses sound waves to create images of your heart. It provides your doctor with information about the size and shape of your heart and how well your heart's chambers and valves are working. This procedure takes approximately one hour. There are no restrictions for this procedure.     Follow-Up: At [facility], you and your health needs are our priority.  As part of our continuing mission to provide you with exceptional heart care, we have created designated Provider Care Teams.  These Care Teams include your primary Cardiologist (physician) and Advanced Practice Providers (APPs -  Physician Assistants and Nurse Practitioners) who all work together to provide you with the care you need, when you need it. You will need a follow up appointment in 5 months.  Please call our office 2 months in advance to schedule this appointment.  You may see Robert Krasowski, MD or another member of our [facility] Provider Team in [place]: Brian Munley, MD . Rajan Revankar, MD  Any Other Special Instructions Will Be Listed Below (If Applicable).   Echocardiogram An echocardiogram is a procedure that uses painless sound waves (ultrasound) to produce an image of the heart. Images from  an echocardiogram can provide important information about:  Signs of coronary artery disease (CAD).  Aneurysm detection. An aneurysm is a weak or damaged part of an artery wall that bulges out from the normal force of blood pumping through the body.  Heart size and shape. Changes in the size or shape of the heart can be associated with certain conditions, including heart failure, aneurysm, and CAD.  Heart muscle function.  Heart valve function.  Signs of a past heart attack.  Fluid buildup around the heart.  Thickening of the heart muscle.  A tumor or infectious growth around the heart valves. Tell a health care provider about:  Any allergies you have.  All medicines you are taking, including vitamins, herbs, eye drops, creams, and over-the-counter medicines.  Any blood disorders you have.  Any surgeries you have had.  Any medical conditions you have.  Whether you are pregnant or may be pregnant. What are the risks? Generally, this is a safe procedure. However, problems may occur, including:  Allergic reaction to dye (contrast) that may be used during the procedure. What happens before the procedure? No specific preparation is needed. You may eat and drink normally. What happens during the procedure?   An IV tube may be inserted into one of your veins.  You may receive contrast through this tube. A contrast is an injection that improves the quality of the pictures from your heart.  A gel will be applied to your chest.  A wand-like tool (transducer) will be moved over your chest. The gel will help to transmit the   waves from the transducer.  The sound waves will harmlessly bounce off of your heart to allow the heart images to be captured in real-time motion. The images will be recorded on a computer. The procedure may vary among health care providers and hospitals. What happens after the procedure?  You may return to your normal, everyday life, including diet,  activities, and medicines, unless your health care provider tells you not to do that. Summary  An echocardiogram is a procedure that uses painless sound waves (ultrasound) to produce an image of the heart.  Images from an echocardiogram can provide important information about the size and shape of your heart, heart muscle function, heart valve function, and fluid buildup around your heart.  You do not need to do anything to prepare before this procedure. You may eat and drink normally.  After the echocardiogram is completed, you may return to your normal, everyday life, unless your health care provider tells you not to do that. This information is not intended to replace advice given to you by your health care provider. Make sure you discuss any questions you have with your health care provider. Document Released: 01/17/2000 Document Revised: 05/12/2018 Document Reviewed: 02/22/2016 Elsevier Patient Education  2020 [employer].

## 2018-11-03 NOTE — Addendum Note (Signed)
Addended by: Ashok Norris on: 11/03/2018 01:45 PM   Modules accepted: Orders

## 2018-11-04 NOTE — Patient Instructions (Addendum)
DUE TO COVID-19 ONLY ONE VISITOR IS ALLOWED TO COME WITH YOU AND STAY IN THE WAITING ROOM ONLY DURING PRE OP AND PROCEDURE DAY OF SURGERY. THE 1 VISITOR MAY VISIT WITH YOU AFTER SURGERY IN YOUR PRIVATE ROOM DURING VISITING HOURS ONLY!  YOU NEED TO HAVE A COVID 19 TEST ON_______ @_______ , THIS TEST MUST BE DONE BEFORE SURGERY, COME  [name]  , [id number].  (Spencer Cochran) ONCE YOUR COVID TEST IS COMPLETED, PLEASE BEGIN THE QUARANTINE INSTRUCTIONS AS OUTLINED IN YOUR HANDOUT.                Spencer Cochran  11/04/2018   Your procedure is scheduled on: 11-14-18   Report to [facility] Main  Entrance   Report to admitting at        100 PM     Call this number if you have problems the morning of surgery [phone]    Remember: Do not eat food or drink liquids :After Midnight.You may have clear liquids until 0800 am then nothing by mouth    BRUSH YOUR TEETH MORNING OF SURGERY AND RINSE YOUR MOUTH OUT, NO CHEWING GUM CANDY OR MINTS.     Take these medicines the morning of surgery with A SIP OF WATER: eye drops as usual, carvedilol,tylenol if needed                                 You may not have any metal on your body including hair pins and              piercings  Do not wear jewelry, , lotions, powders or perfumes, deodorant                        Men may shave face and neck.   Do not bring valuables to the hospital. [place].  Contacts, dentures or bridgework may not be worn into surgery.       Patients discharged the day of surgery will not be allowed to drive home. IF YOU ARE HAVING SURGERY AND GOING HOME THE SAME DAY, YOU MUST HAVE AN ADULT TO DRIVE YOU HOME AND BE WITH YOU FOR 24 HOURS. YOU MAY GO HOME BY TAXI OR UBER OR ORTHERWISE, BUT AN ADULT MUST ACCOMPANY YOU HOME AND STAY WITH YOU FOR 24 HOURS.  Name and phone number of your driver:  Special Instructions: N/A   Please read over the following fact sheets you were given: _____________________________________________________________________           [facility] - Preparing for Surgery Before surgery, you can play an important role.  Because skin is not sterile, your skin needs to be as free of germs as possible.  You can reduce the number of germs on your skin by washing with CHG (chlorahexidine gluconate) soap before surgery.  CHG is an antiseptic cleaner which kills germs and bonds with the skin to continue killing germs even after washing. Please DO NOT use if you have an allergy to CHG or antibacterial soaps.  If your skin becomes reddened/irritated stop using the CHG and inform your nurse when you arrive at Short Stay. Do not shave (including legs and underarms) for at least 48 hours prior to the first CHG shower.  You may shave  your face/neck. Please follow these instructions carefully:  1.  Shower with CHG Soap the night before surgery and the  morning of Surgery.  2.  If you choose to wash your hair, wash your hair first as usual with your  normal  shampoo.  3.  After you shampoo, rinse your hair and body thoroughly to remove the  shampoo.                           4.  Use CHG as you would any other liquid soap.  You can apply chg directly  to the skin and wash                       Gently with a scrungie or clean washcloth.  5.  Apply the CHG Soap to your body ONLY FROM THE NECK DOWN.   Do not use on face/ open                           Wound or open sores. Avoid contact with eyes, ears mouth and genitals (private parts).                       Wash face,  Genitals (private parts) with your normal soap.             6.  Wash thoroughly, paying special attention to the area where your surgery  will be performed.  7.  Thoroughly rinse your body with warm water from the neck down.  8.  DO NOT shower/wash with your normal soap after using and rinsing off  the CHG Soap.                9.  Pat yourself  dry with a clean towel.            10.  Wear clean pajamas.            11.  Place clean sheets on your bed the night of your first shower and do not  sleep with pets. Day of Surgery : Do not apply any lotions/deodorants the morning of surgery.  Please wear clean clothes to the hospital/surgery center.  FAILURE TO FOLLOW THESE INSTRUCTIONS MAY RESULT IN THE CANCELLATION OF YOUR SURGERY PATIENT SIGNATURE_________________________________  NURSE SIGNATURE__________________________________  ________________________________________________________________________

## 2018-11-04 NOTE — Progress Notes (Addendum)
PCP - Maryella Shivers Cardiologist -  Jenne Campus  LOV 11-03-18 epic  Chest x-ray -  EKG -  Stress Test -  ECHO -  Cardiac Cath -   Sleep Study -  CPAP -   Fasting Blood Sugar -  Checks Blood Sugar _____ times a day  Blood Thinner Instructions:elequis stop 2 days prior managed by Dr. Agustin Cree instructed by Dr. Alinda Money Aspirin Instructions: Last Dose:  Anesthesia review: Elequis, OSA cpap, abn ekg  Known history of a fib   Patient denies shortness of breath, fever, cough and chest pain at PAT appointment  none   Patient verbalized understanding of instructions that were given to them at the PAT appointment. Patient was also instructed that they will need to review over the PAT instructions again at home before surgery.

## 2018-11-07 ENCOUNTER — Other Ambulatory Visit: Payer: Self-pay

## 2018-11-07 ENCOUNTER — Encounter (HOSPITAL_COMMUNITY)
Admission: RE | Admit: 2018-11-07 | Discharge: 2018-11-07 | Disposition: A | Discharge status: Home / Self Care | Payer: Medicare Other | Source: Ambulatory Visit | Attending: Urology | Admitting: Urology

## 2018-11-07 ENCOUNTER — Encounter (HOSPITAL_COMMUNITY): Payer: Self-pay

## 2018-11-07 DIAGNOSIS — R7303 Prediabetes: Secondary | ICD-10-CM | POA: Diagnosis not present

## 2018-11-07 DIAGNOSIS — I10 Essential (primary) hypertension: Secondary | ICD-10-CM | POA: Diagnosis not present

## 2018-11-07 DIAGNOSIS — I444 Left anterior fascicular block: Secondary | ICD-10-CM | POA: Diagnosis not present

## 2018-11-07 DIAGNOSIS — I4891 Unspecified atrial fibrillation: Secondary | ICD-10-CM | POA: Insufficient documentation

## 2018-11-07 DIAGNOSIS — K219 Gastro-esophageal reflux disease without esophagitis: Secondary | ICD-10-CM | POA: Insufficient documentation

## 2018-11-07 DIAGNOSIS — Z791 Long term (current) use of non-steroidal anti-inflammatories (NSAID): Secondary | ICD-10-CM | POA: Diagnosis not present

## 2018-11-07 DIAGNOSIS — G4733 Obstructive sleep apnea (adult) (pediatric): Secondary | ICD-10-CM | POA: Diagnosis not present

## 2018-11-07 DIAGNOSIS — Z01818 Encounter for other preprocedural examination: Secondary | ICD-10-CM | POA: Insufficient documentation

## 2018-11-07 DIAGNOSIS — I2510 Atherosclerotic heart disease of native coronary artery without angina pectoris: Secondary | ICD-10-CM | POA: Insufficient documentation

## 2018-11-07 DIAGNOSIS — Z79899 Other long term (current) drug therapy: Secondary | ICD-10-CM | POA: Diagnosis not present

## 2018-11-07 DIAGNOSIS — Z7901 Long term (current) use of anticoagulants: Secondary | ICD-10-CM | POA: Insufficient documentation

## 2018-11-07 DIAGNOSIS — N489 Disorder of penis, unspecified: Secondary | ICD-10-CM | POA: Diagnosis not present

## 2018-11-07 DIAGNOSIS — H409 Unspecified glaucoma: Secondary | ICD-10-CM | POA: Insufficient documentation

## 2018-11-07 DIAGNOSIS — R9431 Abnormal electrocardiogram [ECG] [EKG]: Secondary | ICD-10-CM | POA: Insufficient documentation

## 2018-11-07 LAB — BASIC METABOLIC PANEL
Anion gap: 6 (ref 5–15)
BUN: 23 mg/dL (ref 8–23)
CO2: 27 mmol/L (ref 22–32)
Calcium: 9.1 mg/dL (ref 8.9–10.3)
Chloride: 108 mmol/L (ref 98–111)
Creatinine, Ser: 0.93 mg/dL (ref 0.61–1.24)
GFR calc Af Amer: >60 mL/min (ref >60)
GFR calc non Af Amer: >60 mL/min (ref >60)
Glucose, Bld: 110 mg/dL — ABNORMAL HIGH (ref 70–99)
Potassium: 4 mmol/L (ref 3.5–5.1)
Sodium: 141 mmol/L (ref 135–145)

## 2018-11-07 LAB — CBC
HCT: 38.7 % — ABNORMAL LOW (ref 39.0–52.0)
Hemoglobin: 12.4 g/dL — ABNORMAL LOW (ref 13.0–17.0)
MCH: 31.9 pg (ref 26.0–34.0)
MCHC: 32 g/dL (ref 30.0–36.0)
MCV: 99.5 fL (ref 80.0–100.0)
Platelets: 297 10*3/uL (ref 150–400)
RBC: 3.89 MIL/uL — ABNORMAL LOW (ref 4.22–5.81)
RDW: 14.2 % (ref 11.5–15.5)
WBC: 9.5 10*3/uL (ref 4.0–10.5)
nRBC: 0 % (ref 0.0–0.2)

## 2018-11-07 LAB — HEMOGLOBIN A1C
Hgb A1c MFr Bld: 5.5 % (ref 4.8–5.6)
Mean Plasma Glucose: 111.15 mg/dL

## 2018-11-08 NOTE — Progress Notes (Signed)
Anesthesia Chart Review   Case: [id number] Date/Time: 11/14/18 1445   Procedure: EXCISIONAL BIOPSY OF GLANS PENIS (N/A ) - ONLY NEEDS 60 MIN   Anesthesia type: General   Pre-op diagnosis: PENILE LESION   Location: [place] [facility] / [facility]   Surgeon: Raynelle Bring, MD      DISCUSSION:73 y.o. never smoker with h/o GERD, HTN, OSA on CPAP, CAD, A-fib, pre-diabetes, penile lesion scheduled for above procedure 11/14/2018 with Dr. Raynelle Bring.   Pt last seen by cardiologist, Dr. Brennan Bailey, 11/03/2018.  Per OV note remote history of CAD which is asymptomatic, Overall doing well denies have any chest pain, tightness, pressure, burning in the chest.   Py has been advised by Dr. Alinda Money to stop Eliquis 2 days prior to procedure.   VS: BP 106/69   Pulse 90   Temp 37.2 C (Oral)   Resp 16   Ht 5\' 9"  (1.753 m)   Wt 88 kg   SpO2 98%   BMI 28.65 kg/m   PROVIDERS: Maryella Shivers, MD is PCP   Jenne Campus, MD is Cardiologist  LABS: Labs reviewed: Acceptable for surgery. (all labs ordered are listed, but only abnormal results are displayed)  Labs Reviewed  BASIC METABOLIC PANEL - Abnormal; Notable for the following components:      Result Value   Glucose, Bld 110 (*)    All other components within normal limits  CBC - Abnormal; Notable for the following components:   RBC 3.89 (*)    Hemoglobin 12.4 (*)    HCT 38.7 (*)    All other components within normal limits  HEMOGLOBIN A1C     IMAGES:   EKG: 11/07/2018 Rate 90 bpm  Atrial fibrillation with premature ventricular or aberrantly conducted complexes Left anterior fascicular block  Abnormal ECG   CV: Echo 11/27/2015 Conclusions:  1. Moderate concentric left ventricular hypertrophy, normal systolic function, EF [id number].  2. Mitral annular calcium, mild regurgitation, moderate left atrial dilatation.  3. Aortic sclerosis with mild aortic regurgitation 4. Normal right heart size/function, mild TR, normal pulm artery  pressure.  Past Medical History:  Diagnosis Date  . Arthritis knees and ankle  . Asymptomatic gallstones   . Cancer ([place])    skin   . Cataract immature BILATERAL EYES  . Coronary artery disease CARDIOLOGIST-  DR Agustin Cree  Tia Alert)---  LAST VISIT 04-29-2011   DENIES CARDIAC SYMPTOMS  . DDD (degenerative disc disease), lumbar   . Dysrhythmia    A Fib   . Frequency of urination   . GERD (gastroesophageal reflux disease)   . Glaucoma   . Heart murmur   . History of kidney stones   . Hypertension   . 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 Left ventricular diastolic dysfunction PER ECHO 03-31-2011  W/ CHART  . Nocturia   . OSA on CPAP    wears cpap  . Pre-diabetes   . Unsteadiness   . Urethral stricture     Past Surgical History:  Procedure Laterality Date  . APPENDECTOMY  1972  . CARDIOVASCULAR STRESS TEST  04-29-2010   DR KRASAWSKI   NO EVIDENCE ISCHEMIA/ NORMAL LVSF AND WALL MOTION/ EF 63%  . CERVICAL FUSION  2006   C3 - 6  . colonscopy     . CYSTO/ URETHRAL DILATION/ TRANSURETHRAL INCISIONOF PROSTATE  01-13-2007  . CYSTOSCOPY  2005  . CYSTOSCOPY W/ RETROGRADES  06/12/2011   Procedure: CYSTOSCOPY WITH RETROGRADE PYELOGRAM;  Surgeon: Ailene Rud, MD;  [facility];  Service: Urology;  Laterality: N/A;  cysto, urethral dilation, right retrograde pyelogram    . CYSTOSCOPY WITH RETROGRADE PYELOGRAM, URETEROSCOPY AND STENT PLACEMENT Left 03/08/2014   Procedure: CYSTOSCOPY WITH LEFT RETROGRADE PYELOGRAM/LEFT  URETEROSCOPY;  Surgeon: Ailene Rud, MD;  [facility];  Service: Urology;  Laterality: Left;  . CYSTOSCOPY WITH URETHRAL DILATATION N/A 04/20/2013   Procedure: CYSTOSCOPY WITH URETHRAL DILATATION;  Surgeon: Ailene Rud, MD;  [facility];  Service: Urology;  Laterality: N/A;  . CYSTOSCOPY/URETEROSCOPY/HOLMIUM LASER/STENT PLACEMENT Left 06/10/2017   Procedure:  CYSTOSCOPY/RETROGRADE/URETEROSCOPY/HOLMIUM LASER/STENT PLACEMENT;  Surgeon: Raynelle Bring, MD;  [facility];  Service: Urology;  Laterality: Left;  . EXTRACORPOREAL SHOCK WAVE LITHOTRIPSY  01-17-2007   LEFT  . HOLMIUM LASER APPLICATION Left [id number]   Procedure: LASER OF LEFT RENAL PELVIC STONE;  Surgeon: Ailene Rud, MD;  [facility];  Service: Urology;  Laterality: Left;  . INGUINAL HERNIA REPAIR  2003  . JOINT REPLACEMENT     total knee right 03-01-17  . LEFT ACHILLES TENDON REPAIR  1992  . NASAL SINUS SURGERY  2012  . PENILE SURGERY  OF MEATUS  1955  . TRANSTHORACIC ECHOCARDIOGRAM  03-31-2011   NORMAL LVEF  59%/ TRIVIAL MR/ DIASTOLIC DYSFUNCTION/ MODERATE LVH  . TRANSURETHRAL RESECTION OF PROSTATE N/A 04/20/2013   Procedure: TRANSURETHRAL RESECTION OF THE PROSTATE WITH GYRUS INSTRUMENTS;  Surgeon: Ailene Rud, MD;  [facility];  Service: Urology;  Laterality: N/A;    MEDICATIONS: . acetaminophen (TYLENOL) 650 MG CR tablet  . amLODipine-benazepril (LOTREL) 5-40 MG capsule  . apixaban (ELIQUIS) 5 MG TABS tablet  . atorvastatin (LIPITOR) 40 MG tablet  . carvedilol (COREG) 25 MG tablet  . ferrous sulfate 325 (65 FE) MG tablet  . furosemide (LASIX) 40 MG tablet  . nabumetone (RELAFEN) 500 MG tablet  . naproxen sodium (ALEVE) 220 MG tablet  . nitroGLYCERIN (NITROSTAT) 0.4 MG SL tablet  . Omega-3 Fatty Acids (OMEGA 3 PO)  . omeprazole (PRILOSEC) 20 MG capsule  . timolol (TIMOPTIC) 0.5 % ophthalmic solution  . triamcinolone cream (KENALOG) 0.1 %   No current facility-administered medications for this encounter.    Maia Plan [facility] Pre-Surgical Testing [phone] 11/08/18  12:54 PM

## 2018-11-10 ENCOUNTER — Other Ambulatory Visit (HOSPITAL_COMMUNITY)
Admission: RE | Admit: 2018-11-10 | Discharge: 2018-11-10 | Disposition: A | Discharge status: Home / Self Care | Payer: Medicare Other | Source: Ambulatory Visit | Attending: Urology | Admitting: Urology

## 2018-11-10 DIAGNOSIS — Z01812 Encounter for preprocedural laboratory examination: Secondary | ICD-10-CM | POA: Diagnosis not present

## 2018-11-10 DIAGNOSIS — Z20828 Contact with and (suspected) exposure to other viral communicable diseases: Secondary | ICD-10-CM | POA: Diagnosis not present

## 2018-11-11 LAB — NOVEL CORONAVIRUS, NAA (HOSP ORDER, SEND-OUT TO REF LAB; TAT 18-24 HRS): SARS-CoV-2, NAA: NOT DETECTED

## 2018-11-11 NOTE — H&P (Signed)
Office Visit Report     10/19/2018   --------------------------------------------------------------------------------   Spencer Cochran  [id number]  DOB: 1945/06/04, 73 year old Male  SSN: -[id number]   PRIMARY CARE:  Spencer M. Nyra Capes, MD  REFERRING:  Spencer Dalton, NP  PROVIDER:  Raynelle Cochran, M.D.  LOCATION:  [facility] [phone]     --------------------------------------------------------------------------------   CC/HPI: Penile lesion   Spencer Cochran (pronounced STRITE) returns today for further evaluation of his penile lesion. I had last seen him in May. At that time, he was noted to have a 1 cm ulcerated lesion on the right glans penis. He had not been compliant with topical steroid medication at that time. He started therapy again and initially felt that this was improving. We had discussed proceeding with an excisional biopsy at that time. However, after hearing that his lesion was improved, we elected to hold off. After further treatment with topical steroid medication, he felt that this was actually not improving and feels like it has actually been increase in size. He has not noted any other lesions. He denies any pelvic lymphadenopathy or other pain symptoms. He has previously been treated with empiric valacyclovir and doxycycline. This did not improve his lesion either.   He does have a history of urolithiasis and chronic bacteriuria. He has not had any recent gross hematuria.     ALLERGIES: Hydrocodone-Acetaminophen CAPS    MEDICATIONS: Amlodipine Besylate  Atorvastatin Calcium  Carvedilol 25 mg tablet 0 Oral Twice daily  Eliquis  Fish Oil CAPS Oral  Furosemide 40 MG Oral Tablet Oral  Iron 236 mg (27 mg iron) tablet Oral  Nabumetone  Omeprazole 20 mg capsule,delayed release Oral  Oxycodone-Acetaminophen 5 mg-325 mg tablet 1-2 tablet PO Q 6 H PRN  Triamcinolone Acetonide 0.1 % cream Apply thin layer to affected area TID for 1 month   Viagra 100 mg tablet 1 Oral  Vitamin D 1,250 mcg (50,000 unit) capsule 0 Oral     GU PSH: Cysto Dilate Stricture (M or F) - 2013 Cysto Uretero Lithotripsy - 2016 Cystoscopy - 05/11/2017, 2008 Cystoscopy Insert Stent - 2016 Cystoscopy TUIP - 2008 Cystoscopy TURP - 2015 ESWL - 2009 Initial Male VB Sounds - 2015 Locm 300-399Mg /Ml Iodine,1Ml - 05/05/2017, 2018 Ureteroscopic laser litho, Left - 06/10/2017       PSH Notes: Cystoscopy With Ureteroscopy With Lithotripsy, Cystoscopy With Insertion Of Ureteral Stent Left, Dilat Male Ureth Strict By Spencer Cochran Dilat Initial, Transurethral Resection Of Prostate (TURP), Cystoscopy For Urethral Stricture, Lithotripsy, Cystoscopy (Diagnostic), Urethra Surgery, Transurethral Incision Of Prostate, Primary Repair Of Ruptured Achilles Tendon, Inguinal Hernia Repair, Appendectomy   NON-GU PSH: Appendectomy - 2007 Cataract surgery, Bilateral Repair Achilles Tendon - 2008     GU PMH: 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 - 06/29/2018 Ulcer of penis - 03/30/2018 Gross hematuria - 04/28/2017, Gross hematuria, - 2017 Renal calculus (Stable) - 12/16/2016, Bilateral kidney stones, - 2016 Weak Urinary Stream - 12/16/2016 BPH w/LUTS, Benign localized hyperplasia of prostate with urinary obstruction - 2016 ED due to arterial insufficiency, Erectile dysfunction due to arterial insufficiency - 2016 Primary hypogonadism, Hypogonadism, testicular - 2016 Urethral Stricture, Unspec, Urethral stricture - 2015 Elevated PSA, Elevated prostate specific antigen (PSA) - 2014      PMH Notes:   1) BPH/LUTS: He is s/p a TURP by Spencer Cochran.   Mar 2015: TURP, pathology benign   Current treatment: Finasteride  Prior treatment: Tamsulosin (was able to stop)  2) Hematuria: He has had recurrent gross hematuria since his TURP. He has been anticoagulated for atrial fibrillation with Eliquis. It has been felt that his prostate has been to most  likely source of his hematuria vs. renal stones. He does not smoke.   Jan 2016: CT - B renal stones  Feb 2016: Cystoscopy - Negative  Jan 2017: CT- B renal stones  Feb 2017: Cystoscopy - Negative  Sep 2018: CT - Bilateral non-obstructing renal calculi  Mar 2019: CT - Bilateral renal calculi with left renal pelvic stone (9 mm)   3) Urolithiasis: He has a history of urolithiasis.   2016: Ureteroscopy by Spencer Cochran  May 2019: Left ureteroscopic lithotripsy (calcium oxalate)   4) Testosterone deficiency: His symptoms include erectile dysfunction and decreased libido.   5) Bacteruria: He has had chronic bacteruria with Streptococcus viridans.   6) Penile lesion: He was noted to have a lesion on the right side of the glans penis in February 2020.     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  Drinks 1 caffeinated drink per day.     Notes: Retired, Never A Smoker, Caffeine Use, Alcohol Use, Death In The Family Father, Death In The Family Mother, Tobacco Use, Marital History - Currently Married, Occupation:   REVIEW OF SYSTEMS:    GU Review Male:   Patient reports get up at night to urinate and leakage of urine. Patient denies frequent urination, hard to postpone urination, burning/ pain with urination, stream starts and stops, trouble starting your streams, and have to strain to urinate .  Gastrointestinal (Upper):   Patient denies nausea and vomiting.  Gastrointestinal (Lower):   Patient  reports constipation. Patient denies diarrhea.  Constitutional:   Patient denies fatigue, night sweats, fever, and weight loss.  Skin:   Patient denies skin rash/ lesion and itching.  Eyes:   Patient denies blurred vision and double vision.  Ears/ Nose/ Throat:   Patient denies sore throat and sinus problems.  Hematologic/Lymphatic:   Patient denies swollen glands and easy bruising.  Cardiovascular:   Patient denies leg swelling and chest pains.  Respiratory:   Patient denies cough and shortness of breath.  Endocrine:   Patient denies excessive thirst.  Musculoskeletal:   Patient reports back pain. Patient denies joint pain.  Neurological:   Patient denies headaches and dizziness.  Psychologic:   Patient denies depression and anxiety.   VITAL SIGNS:      10/19/2018 02:59 PM  Weight 191 lb / 86.64 kg  Height 69 in / 175.26 cm  BP 106/70 mmHg  Pulse 112 /min  Temperature 98.2 F / 36.7 C  BMI 28.2 kg/m   GU PHYSICAL EXAMINATION:    Penis: He has a circumcised male phallus without any penile shaft lesions. He does have distortion of the glans penis and urethral meatus consistent with his prior history of hypospadias repair. He does have a persistent ulcerative 1 cm lesion on the right glans penis. This is mildly tender to touch. No fungating lesion or exophytic lesion noted. This remains erythematous.   MULTI-SYSTEM  PHYSICAL EXAMINATION:    Constitutional: Well-nourished. No physical deformities. Normally developed. Good grooming.  Respiratory: No labored breathing, no use of accessory muscles. Clear bilaterally.  Cardiovascular: Normal temperature, normal extremity pulses, no swelling, no varicosities. Irregularly, irregular.  Lymphatic: No inguinal lymphadenopathy.     PAST DATA REVIEWED:  Source Of History:  Patient   08/15/18 02/21/15 08/28/14 02/10/13 04/03/11 12/18/09 11/15/08 09/16/07  PSA  Total PSA 0.32 ng/mL 0.59  0.72  4.18  3.97  2.90  3.20  0.26   Free PSA   0.23  1.18        % Free PSA   32  28         08/27/14 02/11/13 10/14/11 06/02/11 04/03/11 12/18/09 11/15/08 04/04/08  Hormones  Testosterone, Total 466  425  474.93  298.91  368.19  277.39  451.0  266.0     PROCEDURES:          Urinalysis w/Scope Dipstick Dipstick Cont'd Micro  Color: Amber Bilirubin: Neg mg/dL WBC/hpf: 40 - 60/hpf  Appearance: Cloudy Ketones: Neg mg/dL RBC/hpf: >60/hpf  Specific Gravity: 1.025 Blood: 3+ ery/uL Bacteria: Mod (26-50/hpf)  pH: 6.0 Protein: 2+ mg/dL Cystals: NS (Not Seen)  Glucose: Neg mg/dL Urobilinogen: 1.0 mg/dL Casts: NS (Not Seen)    Nitrites: Neg Trichomonas: Not Present    Leukocyte Esterase: 2+ leu/uL Mucous: Not Present      Epithelial Cells: 0 - 5/hpf      Yeast: NS (Not Seen)      Sperm: Not Present    ASSESSMENT:      ICD-10 Details  1 GU:   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 Penile lesion: Considering the persistence of his penile lesion and understands that it now has clearly not responded to topical therapy, I have recommended that he proceed with a biopsy in the near future. We have reviewed this procedure in detail including the potential risks such as bleeding, infection, decreased penile sensation, penile deformity, need for more aggressive surgery pending pathology results, and recurrence of malignancy, etc. He expresses understanding and gives informed consent to proceed.   He will need to stop his fish oil preoperatively. He will also need to stop Eliquis 48 hours prior to his surgery and remain off this for approximately 5-7 days after surgery.   2. Bacteriuria: This is chronic. His urine will be cultured  today.   3. BPH/LUTS: Continue finasteride. He will stay off tamsulosin. He tentatively should be scheduled to see me next year with a PVR and IPSS questionnaire.   4. . Renal calculi: These have remained asymptomatic and are nonobstructing.   5. Prostate cancer screening: His last PSA was 0.32 in July.   Cc: Dr. Maryella Shivers     * Signed by Spencer Cochran, M.D. on 10/19/18 at 4:52 PM (EDT)*

## 2018-11-14 ENCOUNTER — Encounter (HOSPITAL_COMMUNITY)
Admission: RE | Disposition: A | Discharge status: Home / Self Care | Payer: Self-pay | Source: Home / Self Care | Attending: Urology

## 2018-11-14 ENCOUNTER — Ambulatory Visit (HOSPITAL_COMMUNITY): Payer: Medicare Other | Admitting: Physician Assistant

## 2018-11-14 ENCOUNTER — Ambulatory Visit (HOSPITAL_COMMUNITY): Payer: Medicare Other | Admitting: Anesthesiology

## 2018-11-14 ENCOUNTER — Ambulatory Visit (HOSPITAL_COMMUNITY)
Admission: RE | Admit: 2018-11-14 | Discharge: 2018-11-14 | Disposition: A | Discharge status: Home / Self Care | Payer: Medicare Other | Attending: Urology | Admitting: Urology

## 2018-11-14 ENCOUNTER — Encounter (HOSPITAL_COMMUNITY): Payer: Self-pay

## 2018-11-14 DIAGNOSIS — N4889 Other specified disorders of penis: Secondary | ICD-10-CM | POA: Diagnosis not present

## 2018-11-14 DIAGNOSIS — N5201 Erectile dysfunction due to arterial insufficiency: Secondary | ICD-10-CM | POA: Insufficient documentation

## 2018-11-14 DIAGNOSIS — E291 Testicular hypofunction: Secondary | ICD-10-CM | POA: Insufficient documentation

## 2018-11-14 DIAGNOSIS — N485 Ulcer of penis: Secondary | ICD-10-CM | POA: Diagnosis not present

## 2018-11-14 DIAGNOSIS — R8271 Bacteriuria: Secondary | ICD-10-CM | POA: Insufficient documentation

## 2018-11-14 DIAGNOSIS — N200 Calculus of kidney: Secondary | ICD-10-CM | POA: Diagnosis not present

## 2018-11-14 DIAGNOSIS — E7800 Pure hypercholesterolemia, unspecified: Secondary | ICD-10-CM | POA: Diagnosis not present

## 2018-11-14 DIAGNOSIS — G4730 Sleep apnea, unspecified: Secondary | ICD-10-CM | POA: Insufficient documentation

## 2018-11-14 DIAGNOSIS — I4891 Unspecified atrial fibrillation: Secondary | ICD-10-CM | POA: Insufficient documentation

## 2018-11-14 DIAGNOSIS — N401 Enlarged prostate with lower urinary tract symptoms: Secondary | ICD-10-CM | POA: Insufficient documentation

## 2018-11-14 DIAGNOSIS — Z79899 Other long term (current) drug therapy: Secondary | ICD-10-CM | POA: Insufficient documentation

## 2018-11-14 DIAGNOSIS — I10 Essential (primary) hypertension: Secondary | ICD-10-CM | POA: Insufficient documentation

## 2018-11-14 DIAGNOSIS — R310 Gross hematuria: Secondary | ICD-10-CM | POA: Diagnosis not present

## 2018-11-14 DIAGNOSIS — N138 Other obstructive and reflux uropathy: Secondary | ICD-10-CM | POA: Diagnosis not present

## 2018-11-14 DIAGNOSIS — K219 Gastro-esophageal reflux disease without esophagitis: Secondary | ICD-10-CM | POA: Diagnosis not present

## 2018-11-14 DIAGNOSIS — I2510 Atherosclerotic heart disease of native coronary artery without angina pectoris: Secondary | ICD-10-CM | POA: Insufficient documentation

## 2018-11-14 DIAGNOSIS — Z7901 Long term (current) use of anticoagulants: Secondary | ICD-10-CM | POA: Diagnosis not present

## 2018-11-14 DIAGNOSIS — Z87442 Personal history of urinary calculi: Secondary | ICD-10-CM | POA: Insufficient documentation

## 2018-11-14 DIAGNOSIS — N489 Disorder of penis, unspecified: Secondary | ICD-10-CM | POA: Diagnosis present

## 2018-11-14 DIAGNOSIS — D408 Neoplasm of uncertain behavior of other specified male genital organs: Secondary | ICD-10-CM | POA: Diagnosis not present

## 2018-11-14 HISTORY — PX: MASS EXCISION: SHX2000

## 2018-11-14 SURGERY — EXCISION MASS
Anesthesia: General

## 2018-11-14 MED ORDER — PROPOFOL 10 MG/ML IV BOLUS
INTRAVENOUS | Status: AC
  Filled 2018-11-14: qty 20

## 2018-11-14 MED ORDER — BUPIVACAINE HCL (PF) 0.5 % IJ SOLN
INTRAMUSCULAR | Status: DC | PRN
  Administered 2018-11-14: 10 mL

## 2018-11-14 MED ORDER — FENTANYL CITRATE (PF) 100 MCG/2ML IJ SOLN
INTRAMUSCULAR | Status: AC
  Filled 2018-11-14: qty 2

## 2018-11-14 MED ORDER — 0.9 % SODIUM CHLORIDE (POUR BTL) OPTIME
TOPICAL | Status: DC | PRN
  Administered 2018-11-14: 1000 mL

## 2018-11-14 MED ORDER — FENTANYL CITRATE (PF) 100 MCG/2ML IJ SOLN: 25.0000 ug | INTRAMUSCULAR | Status: DC | PRN

## 2018-11-14 MED ORDER — TRAMADOL HCL 50 MG PO TABS: 50.0000 mg | ORAL_TABLET | Freq: Four times a day (QID) | ORAL | 0 refills | Status: DC | PRN

## 2018-11-14 MED ORDER — ONDANSETRON HCL 4 MG/2ML IJ SOLN
INTRAMUSCULAR | Status: DC | PRN
  Administered 2018-11-14: 4 mg via INTRAVENOUS

## 2018-11-14 MED ORDER — LIDOCAINE 2% (20 MG/ML) 5 ML SYRINGE
INTRAMUSCULAR | Status: DC | PRN
  Administered 2018-11-14: 80 mg via INTRAVENOUS

## 2018-11-14 MED ORDER — LACTATED RINGERS IV SOLN
INTRAVENOUS | Status: DC
  Administered 2018-11-14 (×2): via INTRAVENOUS

## 2018-11-14 MED ORDER — PROPOFOL 10 MG/ML IV BOLUS
INTRAVENOUS | Status: DC | PRN
  Administered 2018-11-14: 50 mg via INTRAVENOUS
  Administered 2018-11-14: 100 mg via INTRAVENOUS
  Administered 2018-11-14: 50 mg via INTRAVENOUS

## 2018-11-14 MED ORDER — FENTANYL CITRATE (PF) 100 MCG/2ML IJ SOLN
INTRAMUSCULAR | Status: DC | PRN
  Administered 2018-11-14: 50 ug via INTRAVENOUS

## 2018-11-14 MED ORDER — CEFAZOLIN SODIUM-DEXTROSE 2-4 GM/100ML-% IV SOLN
2.0000 g | Freq: Once | INTRAVENOUS | Status: AC
  Administered 2018-11-14: 2 g via INTRAVENOUS
  Filled 2018-11-14: qty 100

## 2018-11-14 MED ORDER — DEXAMETHASONE SODIUM PHOSPHATE 10 MG/ML IJ SOLN
INTRAMUSCULAR | Status: DC | PRN
  Administered 2018-11-14: 5 mg via INTRAVENOUS

## 2018-11-14 MED ORDER — VASOPRESSIN 20 UNIT/ML IV SOLN
INTRAVENOUS | Status: DC | PRN
  Administered 2018-11-14: 1 [IU] via INTRAVENOUS

## 2018-11-14 MED ORDER — ONDANSETRON HCL 4 MG/2ML IJ SOLN
INTRAMUSCULAR | Status: AC
  Filled 2018-11-14: qty 2

## 2018-11-14 MED ORDER — VASOPRESSIN 20 UNIT/ML IV SOLN
INTRAVENOUS | Status: AC
  Filled 2018-11-14: qty 1

## 2018-11-14 MED ORDER — BACITRACIN-NEOMYCIN-POLYMYXIN 400-5-5000 EX OINT
TOPICAL_OINTMENT | CUTANEOUS | Status: AC
  Filled 2018-11-14: qty 1

## 2018-11-14 MED ORDER — ACETAMINOPHEN 500 MG PO TABS
ORAL_TABLET | ORAL | Status: AC
  Filled 2018-11-14: qty 1

## 2018-11-14 MED ORDER — PHENYLEPHRINE 40 MCG/ML (10ML) SYRINGE FOR IV PUSH (FOR BLOOD PRESSURE SUPPORT)
PREFILLED_SYRINGE | INTRAVENOUS | Status: DC | PRN
  Administered 2018-11-14 (×3): 80 ug via INTRAVENOUS

## 2018-11-14 MED ORDER — ACETAMINOPHEN 500 MG PO TABS
500.0000 mg | ORAL_TABLET | Freq: Once | ORAL | Status: AC
  Administered 2018-11-14: 500 mg via ORAL

## 2018-11-14 MED ORDER — BUPIVACAINE HCL (PF) 0.5 % IJ SOLN
INTRAMUSCULAR | Status: AC
  Filled 2018-11-14: qty 30

## 2018-11-14 MED ORDER — ACETAMINOPHEN 500 MG PO TABS: 1000.0000 mg | ORAL_TABLET | Freq: Once | ORAL | Status: DC

## 2018-11-14 MED ORDER — DEXAMETHASONE SODIUM PHOSPHATE 10 MG/ML IJ SOLN
INTRAMUSCULAR | Status: AC
  Filled 2018-11-14: qty 1

## 2018-11-14 SURGICAL SUPPLY — 56 items
ATTRACTOMAT 16X20 MAGNETIC DRP (DRAPES) IMPLANT
BLADE EXTENDED COATED 6.5IN (ELECTRODE) IMPLANT
BLADE HEX COATED 2.75 (ELECTRODE) IMPLANT
CATH FOLEY 2WAY SLVR  5CC 16FR (CATHETERS)
CATH FOLEY 2WAY SLVR 5CC 16FR (CATHETERS) IMPLANT
CHLORAPREP W/TINT 26 (MISCELLANEOUS) IMPLANT
CLIP VESOLOCK LG 6/CT PURPLE (CLIP) IMPLANT
CLIP VESOLOCK MED LG 6/CT (CLIP) IMPLANT
COVER BACK TABLE 60X90IN (DRAPES) IMPLANT
COVER WAND RF STERILE (DRAPES) IMPLANT
DECANTER SPIKE VIAL GLASS SM (MISCELLANEOUS) IMPLANT
DISSECTOR ROUND CHERRY 3/8 STR (MISCELLANEOUS) IMPLANT
DRAIN CHANNEL 15F RND FF 3/16 (WOUND CARE) IMPLANT
DRAIN PENROSE 18X1/2 LTX STRL (DRAIN) IMPLANT
DRAIN PENROSE 18X1/4 LTX STRL (WOUND CARE) ×2 IMPLANT
DRAPE LAPAROSCOPIC ABDOMINAL (DRAPES) IMPLANT
DRAPE LAPAROTOMY T 98X78 PEDS (DRAPES) ×2 IMPLANT
DRAPE LAPAROTOMY TRNSV 102X78 (DRAPES) IMPLANT
DRAPE UTILITY XL STRL (DRAPES) IMPLANT
DRAPE WARM FLUID 44X44 (DRAPES) IMPLANT
ELECT REM PT RETURN 15FT ADLT (MISCELLANEOUS) ×2 IMPLANT
EVACUATOR SILICONE 100CC (DRAIN) IMPLANT
GAUZE 4X4 16PLY RFD (DISPOSABLE) ×2 IMPLANT
GAUZE SPONGE 4X4 12PLY STRL (GAUZE/BANDAGES/DRESSINGS) IMPLANT
GLOVE BIOGEL M STRL SZ7.5 (GLOVE) ×2 IMPLANT
GOWN STRL REUS W/TWL LRG LVL3 (GOWN DISPOSABLE) ×4 IMPLANT
HEMOSTAT SURGICEL 4X8 (HEMOSTASIS) IMPLANT
KIT BASIN OR (CUSTOM PROCEDURE TRAY) ×2 IMPLANT
KIT TURNOVER KIT A (KITS) IMPLANT
LOOP VESSEL MAXI BLUE (MISCELLANEOUS) IMPLANT
NEEDLE HYPO 22GX1.5 SAFETY (NEEDLE) ×2 IMPLANT
PACK GENERAL/GYN (CUSTOM PROCEDURE TRAY) ×2 IMPLANT
PROTECTOR NERVE ULNAR (MISCELLANEOUS) IMPLANT
SPONGE LAP 18X18 RF (DISPOSABLE) ×4 IMPLANT
SPONGE SURGIFOAM ABS GEL 100 (HEMOSTASIS) IMPLANT
STAPLER VISISTAT 35W (STAPLE) IMPLANT
SURGIFLO W/THROMBIN 8M KIT (HEMOSTASIS) IMPLANT
SUT CHROMIC 3 0 SH 27 (SUTURE) ×2 IMPLANT
SUT ETHILON 3 0 PS 1 (SUTURE) IMPLANT
SUT MON AB 2-0 SH 27 (SUTURE)
SUT MON AB 2-0 SH27 (SUTURE) IMPLANT
SUT PDS AB 1 TP1 96 (SUTURE) IMPLANT
SUT SILK 0 (SUTURE)
SUT SILK 0 30XBRD TIE 6 (SUTURE) IMPLANT
SUT SILK 2 0 (SUTURE)
SUT SILK 2-0 30XBRD TIE 12 (SUTURE) IMPLANT
SUT VIC AB 2-0 SH 27 (SUTURE)
SUT VIC AB 2-0 SH 27X BRD (SUTURE) IMPLANT
SUT VIC AB 4-0 RB1 27 (SUTURE)
SUT VIC AB 4-0 RB1 27XBRD (SUTURE) IMPLANT
SYR CONTROL 10ML LL (SYRINGE) ×2 IMPLANT
TAPE UMBILICAL COTTON 1/8X30 (MISCELLANEOUS) IMPLANT
TOWEL OR 17X26 10 PK STRL BLUE (TOWEL DISPOSABLE) ×2 IMPLANT
TOWEL OR NON WOVEN STRL DISP B (DISPOSABLE) ×2 IMPLANT
TUBING CONNECTING 10 (TUBING) IMPLANT
URINEMETER 200ML W/220 (MISCELLANEOUS) IMPLANT

## 2018-11-14 NOTE — Transfer of Care (Signed)
Immediate Anesthesia Transfer of Care Note  Patient: Spencer Cochran  Procedure(s) Performed: EXCISIONAL BIOPSY OF GLANS PENIS (N/A )  Patient Location: PACU  Anesthesia Type:General  Level of Consciousness: awake, alert  and patient cooperative  Airway & Oxygen Therapy: Patient Spontanous Breathing and Patient connected to face mask oxygen  Post-op Assessment: Report given to RN and Post -op Vital signs reviewed and stable  Post vital signs: Reviewed and stable  Last Vitals:  Vitals Value Taken Time  BP 110/70 11/14/18 1745  Temp 37 C 11/14/18 1744  Pulse 104 11/14/18 1746  Resp 20 11/14/18 1746  SpO2 96 % 11/14/18 1746  Vitals shown include unvalidated device data.  Last Pain:  Vitals:   11/14/18 1324  TempSrc:   PainSc: 9       Patients Stated Pain Goal: 8 ([id number])  Complications: No apparent anesthesia complications

## 2018-11-14 NOTE — Anesthesia Postprocedure Evaluation (Signed)
Anesthesia Post Note  Patient: Spencer Cochran  Procedure(s) Performed: EXCISIONAL BIOPSY OF GLANS PENIS (N/A )     Patient location during evaluation: PACU Anesthesia Type: General Level of consciousness: awake and alert Pain management: pain level controlled Vital Signs Assessment: post-procedure vital signs reviewed and stable Respiratory status: spontaneous breathing, nonlabored ventilation, respiratory function stable and patient connected to nasal cannula oxygen Cardiovascular status: blood pressure returned to baseline and stable Postop Assessment: no apparent nausea or vomiting Anesthetic complications: no    Last Vitals:  Vitals:   11/14/18 1800 11/14/18 1815  BP: 112/64 115/82  Pulse: (!) 102 93  Resp: 13 17  Temp:  37.2 C  SpO2: 92% 93%    Last Pain:  Vitals:   11/14/18 1815  TempSrc:   PainSc: 0-No pain                 Hilaria Titsworth L Swetha Rayle

## 2018-11-14 NOTE — Discharge Instructions (Signed)
You should call if you develop fever or ongoing bleeding.    Apply neosporin to the penis twice daily for 2 weeks.

## 2018-11-14 NOTE — Anesthesia Procedure Notes (Addendum)
Procedure Name: LMA Insertion Date/Time: 11/14/2018 5:09 PM Performed by: West Pugh, CRNA Pre-anesthesia Checklist: Patient identified, Emergency Drugs available, Suction available, Patient being monitored and Timeout performed Patient Re-evaluated:Patient Re-evaluated prior to induction Oxygen Delivery Method: Circle system utilized Preoxygenation: Pre-oxygenation with 100% oxygen Induction Type: IV induction Ventilation: Mask ventilation without difficulty LMA: LMA inserted LMA Size: 4.0 Number of attempts: 3 Placement Confirmation: positive ETCO2 and breath sounds checked- equal and bilateral Tube secured with: Tape Dental Injury: Teeth and Oropharynx as per pre-operative assessment  Comments: Attempted with LMA #5 proseal, large airleak. LMA #4 proseal attempted without success. Regular LMA #4 inserted with success.easy mask ventilation throughout exchange of LMA's

## 2018-11-14 NOTE — Interval H&P Note (Signed)
History and Physical Interval Note:  11/14/2018 4:48 PM  Spencer Cochran  has presented today for surgery, with the diagnosis of PENILE LESION.  The various methods of treatment have been discussed with the patient and family. After consideration of risks, benefits and other options for treatment, the patient has consented to  Procedure(s) with comments: EXCISIONAL BIOPSY OF GLANS PENIS (N/A) - ONLY NEEDS 60 MIN as a surgical intervention.  The patient's history has been reviewed, patient examined, no change in status, stable for surgery.  I have reviewed the patient's chart and labs.  Questions were answered to the patient's satisfaction.     [name]

## 2018-11-14 NOTE — Op Note (Addendum)
Pre-operative diagnosis:  Penile lesion  Postoperative diagnosis:  Penile lesion  Procedure:  Excisional biopsy of penile lesion  Surgeon: Pryor Curia. M.D. Resident: Kerrie Pleasure, MD   Anesthesia: General  Complications: None  EBL: Minimal  Specimens:  Penis lesion  Disposition of specimen: Pathology  Indication: Spencer Cochran is a 73 y.o. male who was diagnosed with ulcerative penile lesion on glans not responding to steroid treatment.  He elected to proceed with excisional biopsy for treatment and diagnosis. The potential risks, complications, alternative options, and expected recovery process was discussed in detail and informed consent was obtained.  Description of procedure: The patient was taken to the operating room and a general anesthetic was administered. He was given preoperative antibiotics, placed in the supine position, and his genitalia was prepped and draped in the usual sterile fashion. Next, a preoperative timeout was performed.  The penis was examined and the subcentimeter small penile lesion was visualized on the right side of the glans.  An elliptical incision was marked.  Using a 15 blade, excisional biopsy of the entire small lesion was performed along the marked lines.  Excellent hemostasis was obtained with Bovie cautery.  Three 3-0 chromic interrupted sutures were placed in order to close the wound.  A penile block was performed with 10 cc 0.5%  lidocaine without epinephrine at the end of the case.  Bacitracin ointment was then placed.  The patient tolerated the procedure well and without complications. He was able to be awakened and transferred to the recovery unit in satisfactory condition.

## 2018-11-14 NOTE — Anesthesia Preprocedure Evaluation (Addendum)
Anesthesia Evaluation  Patient identified by MRN, date of birth, ID band Patient awake    Reviewed: Allergy & Precautions, NPO status , Patient's Chart, lab work & pertinent test results  Airway Mallampati: II  TM Distance: >3 FB Neck ROM: Full    Dental no notable dental hx. (+) Teeth Intact, Dental Advisory Given   Pulmonary sleep apnea and Continuous Positive Airway Pressure Ventilation ,    Pulmonary exam normal breath sounds clear to auscultation       Cardiovascular hypertension, + CAD  Normal cardiovascular exam+ dysrhythmias Atrial Fibrillation + Valvular Problems/Murmurs AI  Rhythm:Regular Rate:Normal  EKG: 11/07/2018 Rate 90 bpm  Atrial fibrillation with premature ventricular or aberrantly conducted complexes Left anterior fascicular block  Abnormal ECG   CV: Echo 11/27/2015 Conclusions:  1. Moderate concentric left ventricular hypertrophy, normal systolic function, EF [id number].  2. Mitral annular calcium, mild regurgitation, moderate left atrial dilatation.  3. Aortic sclerosis with mild aortic regurgitation 4. Normal right heart size/function, mild TR, normal pulm artery pressure.    Neuro/Psych negative neurological ROS  negative psych ROS   GI/Hepatic Neg liver ROS, GERD  Medicated and Controlled,  Endo/Other  negative endocrine ROS  Renal/GU negative Renal ROS  negative genitourinary   Musculoskeletal  (+) Arthritis ,   Abdominal   Peds  Hematology  (+) Blood dyscrasia (on eliquis), ,   Anesthesia Other Findings   Reproductive/Obstetrics                            Anesthesia Physical Anesthesia Plan  ASA: III  Anesthesia Plan: General   Post-op Pain Management:    Induction: Intravenous  PONV Risk Score and Plan: Ondansetron and Dexamethasone  Airway Management Planned: LMA  Additional Equipment:   Intra-op Plan:   Post-operative Plan: Extubation in  OR  Informed Consent: I have reviewed the patients History and Physical, chart, labs and discussed the procedure including the risks, benefits and alternatives for the proposed anesthesia with the patient or authorized representative who has indicated his/her understanding and acceptance.     Dental advisory given  Plan Discussed with: CRNA  Anesthesia Plan Comments:         Anesthesia Quick Evaluation

## 2018-11-15 ENCOUNTER — Encounter (HOSPITAL_COMMUNITY): Payer: Self-pay | Admitting: Urology

## 2018-11-16 LAB — SURGICAL PATHOLOGY

## 2018-11-22 DIAGNOSIS — N401 Enlarged prostate with lower urinary tract symptoms: Secondary | ICD-10-CM | POA: Diagnosis not present

## 2018-11-22 DIAGNOSIS — R3912 Poor urinary stream: Secondary | ICD-10-CM | POA: Diagnosis not present

## 2018-11-22 DIAGNOSIS — N200 Calculus of kidney: Secondary | ICD-10-CM | POA: Diagnosis not present

## 2018-11-24 ENCOUNTER — Other Ambulatory Visit: Payer: Self-pay

## 2018-11-24 ENCOUNTER — Ambulatory Visit (INDEPENDENT_AMBULATORY_CARE_PROVIDER_SITE_OTHER): Payer: Medicare Other

## 2018-11-24 DIAGNOSIS — I4821 Permanent atrial fibrillation: Secondary | ICD-10-CM | POA: Diagnosis not present

## 2018-11-24 DIAGNOSIS — I2510 Atherosclerotic heart disease of native coronary artery without angina pectoris: Secondary | ICD-10-CM | POA: Diagnosis not present

## 2018-11-24 NOTE — Progress Notes (Signed)
Complete echocardiogram has been performed.  Jimmy Clayvon Parlett RDCS, RVT 

## 2018-12-02 ENCOUNTER — Telehealth: Payer: Self-pay | Admitting: Cardiology

## 2018-12-02 DIAGNOSIS — E782 Mixed hyperlipidemia: Secondary | ICD-10-CM | POA: Diagnosis not present

## 2018-12-02 DIAGNOSIS — I2510 Atherosclerotic heart disease of native coronary artery without angina pectoris: Secondary | ICD-10-CM | POA: Diagnosis not present

## 2018-12-02 DIAGNOSIS — E870 Hyperosmolality and hypernatremia: Secondary | ICD-10-CM | POA: Diagnosis not present

## 2018-12-02 DIAGNOSIS — I10 Essential (primary) hypertension: Secondary | ICD-10-CM | POA: Diagnosis not present

## 2018-12-02 DIAGNOSIS — R7303 Prediabetes: Secondary | ICD-10-CM | POA: Diagnosis not present

## 2018-12-02 NOTE — Telephone Encounter (Signed)
Patient informed of results.  

## 2018-12-02 NOTE — Telephone Encounter (Signed)
Wants echo results °

## 2018-12-05 ENCOUNTER — Other Ambulatory Visit: Payer: Self-pay | Admitting: Cardiology

## 2018-12-09 DIAGNOSIS — Z6827 Body mass index (BMI) 27.0-27.9, adult: Secondary | ICD-10-CM | POA: Diagnosis not present

## 2018-12-09 DIAGNOSIS — R1032 Left lower quadrant pain: Secondary | ICD-10-CM | POA: Diagnosis not present

## 2018-12-09 DIAGNOSIS — N390 Urinary tract infection, site not specified: Secondary | ICD-10-CM | POA: Diagnosis not present

## 2018-12-09 DIAGNOSIS — M533 Sacrococcygeal disorders, not elsewhere classified: Secondary | ICD-10-CM | POA: Diagnosis not present

## 2018-12-22 ENCOUNTER — Ambulatory Visit (INDEPENDENT_AMBULATORY_CARE_PROVIDER_SITE_OTHER): Payer: Medicare Other | Admitting: Sports Medicine

## 2018-12-22 ENCOUNTER — Encounter: Payer: Self-pay | Admitting: Sports Medicine

## 2018-12-22 ENCOUNTER — Other Ambulatory Visit: Payer: Self-pay

## 2018-12-22 DIAGNOSIS — Z872 Personal history of diseases of the skin and subcutaneous tissue: Secondary | ICD-10-CM | POA: Diagnosis not present

## 2018-12-22 DIAGNOSIS — H43813 Vitreous degeneration, bilateral: Secondary | ICD-10-CM | POA: Diagnosis not present

## 2018-12-22 DIAGNOSIS — M79675 Pain in left toe(s): Secondary | ICD-10-CM

## 2018-12-22 DIAGNOSIS — Z7901 Long term (current) use of anticoagulants: Secondary | ICD-10-CM

## 2018-12-22 DIAGNOSIS — M79674 Pain in right toe(s): Secondary | ICD-10-CM | POA: Diagnosis not present

## 2018-12-22 DIAGNOSIS — B351 Tinea unguium: Secondary | ICD-10-CM

## 2018-12-22 DIAGNOSIS — H43393 Other vitreous opacities, bilateral: Secondary | ICD-10-CM | POA: Diagnosis not present

## 2018-12-22 DIAGNOSIS — H52223 Regular astigmatism, bilateral: Secondary | ICD-10-CM | POA: Diagnosis not present

## 2018-12-22 DIAGNOSIS — H524 Presbyopia: Secondary | ICD-10-CM | POA: Diagnosis not present

## 2018-12-22 DIAGNOSIS — I739 Peripheral vascular disease, unspecified: Secondary | ICD-10-CM | POA: Diagnosis not present

## 2018-12-22 DIAGNOSIS — H5211 Myopia, right eye: Secondary | ICD-10-CM | POA: Diagnosis not present

## 2018-12-22 NOTE — Progress Notes (Signed)
Subjective: Spencer Cochran is a 73 y.o. male patient seen today in office with complaint of mildly painful thickened and elongated toenails; unable to trim. Patient is still on Eliquis blood thinner for history of Cardiac/ A Fib like before with no changes in this medication.  No other pedal complaints at this time.  FBS 116 yesterday diet controlled A1c 5.8 PCP Dr. Nyra Capes 2 weeks ago  Patient Active Problem List   Diagnosis Date Noted  . Gait abnormality 08/03/2018  . Paresthesia 07/18/2018  . Coronary artery disease 10/01/2016  . Dyslipidemia 10/01/2016  . Atrial fibrillation ([place]) 03/08/2014  . Benign prostatic hypertrophy with incomplete bladder emptying 04/20/2013    Current Outpatient Medications on File Prior to Visit  Medication Sig Dispense Refill  . acetaminophen (TYLENOL) 650 MG CR tablet Take 1,300 mg by mouth daily as needed for pain.    Marland Kitchen amLODipine-benazepril (LOTREL) 5-40 MG capsule TAKE 1 CAPSULE DAILY (Patient taking differently: Take 1 capsule by mouth at bedtime. ) 90 capsule 2  . atorvastatin (LIPITOR) 40 MG tablet Take 40 mg by mouth at bedtime.     . carvedilol (COREG) 25 MG tablet Take 1 tablet (25 mg total) by mouth 2 (two) times daily with a meal. 180 tablet 1  . ferrous sulfate 325 (65 FE) MG tablet Take 325 mg by mouth daily.    . furosemide (LASIX) 40 MG tablet TAKE 1 TABLET EVERY OTHER DAY 45 tablet 4  . nabumetone (RELAFEN) 500 MG tablet Take 500 mg by mouth 2 (two) times daily as needed (pain.).     Marland Kitchen nitroGLYCERIN (NITROSTAT) 0.4 MG SL tablet Place 0.4 mg under the tongue every 5 (five) minutes x 3 doses as needed for chest pain.     . Omega-3 Fatty Acids (OMEGA 3 PO) Take 520 mg by mouth daily.    Marland Kitchen omeprazole (PRILOSEC) 20 MG capsule Take 20 mg by mouth daily before breakfast.     . timolol (TIMOPTIC) 0.5 % ophthalmic solution Place 1 drop into both eyes 2 (two) times daily.     . traMADol (ULTRAM) 50 MG tablet Take 1-2 tablets (50-100 mg total) by  mouth every 6 (six) hours as needed (pain). 15 tablet 0  . triamcinolone cream (KENALOG) 0.1 % Apply 1 application topically daily as needed (skin irritation).      No current facility-administered medications on file prior to visit.     Allergies  Allergen Reactions  . Hydrocodone Other (See Comments)    insomnia "awake for 48 hours"    Objective: Physical Exam  General: Well developed, nourished, no acute distress, awake, alert and oriented x 3  Vascular: Dorsalis pedis artery 1/4 bilateral, Posterior tibial artery 0/4 bilateral due to trace edema at ankles bilateral, skin temperature warm to warm proximal to distal bilateral lower extremities, mild varicosities, scant pedal hair present bilateral.  Neurological: Gross sensation present via light touch bilateral.   Dermatological: Skin is warm, dry, and supple bilateral, Nails 1-10 are tender, long, thick, and discolored with mild subungal debris,  there is mild incurvation at medial and lateral hallux nail margins without any acute signs of infection, no webspace macerations present bilateral, no open lesions present bilateral, no callus/corns/hyperkeratotic tissue present bilateral. No signs of infection bilateral.  Musculoskeletal: Pes planus deformities noted bilateral. Muscular strength within normal limits without painon range of motion. No pain with calf compression bilateral.  Assessment and Plan:  Problem List Items Addressed This Visit    None  Visit Diagnoses    Pain due to onychomycosis of toenails of both feet    -  Primary   History of ingrowing nail       Long term current use of anticoagulant therapy       PVD (peripheral vascular disease) ([place])         -Examined patient.  -Discussed treatment options for painful mycotic nails. -Mechanically debrided and reduced mycotic nails and removed all offending nail borders with sterile nail nipper and dremel nail file without incident. -Patient to return in 3 months  for follow up nail care or sooner if symptoms worsen.  Encouraged patient to consider nail avulsion procedure if corners of big toes still continues to be painful.  Landis Martins, DPM

## 2018-12-23 DIAGNOSIS — K5900 Constipation, unspecified: Secondary | ICD-10-CM | POA: Diagnosis not present

## 2018-12-23 DIAGNOSIS — R1032 Left lower quadrant pain: Secondary | ICD-10-CM | POA: Diagnosis not present

## 2018-12-23 DIAGNOSIS — Z6829 Body mass index (BMI) 29.0-29.9, adult: Secondary | ICD-10-CM | POA: Diagnosis not present

## 2019-01-02 DIAGNOSIS — R7303 Prediabetes: Secondary | ICD-10-CM | POA: Diagnosis not present

## 2019-01-02 DIAGNOSIS — I2510 Atherosclerotic heart disease of native coronary artery without angina pectoris: Secondary | ICD-10-CM | POA: Diagnosis not present

## 2019-01-02 DIAGNOSIS — I10 Essential (primary) hypertension: Secondary | ICD-10-CM | POA: Diagnosis not present

## 2019-01-02 DIAGNOSIS — E870 Hyperosmolality and hypernatremia: Secondary | ICD-10-CM | POA: Diagnosis not present

## 2019-01-04 DIAGNOSIS — K5900 Constipation, unspecified: Secondary | ICD-10-CM | POA: Diagnosis not present

## 2019-01-04 DIAGNOSIS — R1032 Left lower quadrant pain: Secondary | ICD-10-CM | POA: Diagnosis not present

## 2019-01-04 DIAGNOSIS — Z6829 Body mass index (BMI) 29.0-29.9, adult: Secondary | ICD-10-CM | POA: Diagnosis not present

## 2019-01-25 DIAGNOSIS — N201 Calculus of ureter: Secondary | ICD-10-CM | POA: Diagnosis not present

## 2019-01-30 HISTORY — PX: HERNIA REPAIR: SHX51

## 2019-02-07 DIAGNOSIS — Z09 Encounter for follow-up examination after completed treatment for conditions other than malignant neoplasm: Secondary | ICD-10-CM | POA: Diagnosis not present

## 2019-02-14 DIAGNOSIS — N201 Calculus of ureter: Secondary | ICD-10-CM | POA: Diagnosis not present

## 2019-02-23 DIAGNOSIS — L300 Nummular dermatitis: Secondary | ICD-10-CM | POA: Diagnosis not present

## 2019-02-23 DIAGNOSIS — L570 Actinic keratosis: Secondary | ICD-10-CM | POA: Diagnosis not present

## 2019-02-23 DIAGNOSIS — L821 Other seborrheic keratosis: Secondary | ICD-10-CM | POA: Diagnosis not present

## 2019-02-28 ENCOUNTER — Other Ambulatory Visit: Payer: Self-pay | Admitting: Urology

## 2019-03-02 ENCOUNTER — Other Ambulatory Visit: Payer: Self-pay

## 2019-03-02 ENCOUNTER — Encounter (HOSPITAL_BASED_OUTPATIENT_CLINIC_OR_DEPARTMENT_OTHER): Payer: Self-pay | Admitting: Urology

## 2019-03-02 NOTE — Progress Notes (Addendum)
Spoke w/ via phone for pre-op interview---[facility] [facility] needs dos---- I stat 8             Lab results------ COVID test ------03-06-2019 Arrive at -------530 am 03-09-2019 NPO after ------midnight Medications to take morning of surgery -----coreg, eye drop, omeprazole Diabetic medication -----n/a Patient Special Instructions -----bring cpap mask and tubing Pre-Op special Istructions ----- Patient verbalized understanding of instructions that were given at this phone interview. Patient denies shortness of breath, chest pain, fever, cough a this phone interview.  Anesthesia: osa, cad, on eliquis, addendum: ok to proceed per Janett Billow zanetto pa  PCP:dr francisco hodges  Cardiologist :dr Fermin Schwab 11-03-2018 Chest x-ray :none EKG :11-07-2018 chart/epic Echo :11-24-2018 chart/epic Cardiac Cath : not sure when Sleep Study/ CPAP : uses cpap setting of 12, last sleep study yrs ago Fasting Blood Sugar :      / Checks Blood Sugar -- times a day:  Checks cbg 3 x day cbg runs 151 to 158 is prediabetic Blood Thinner/ Instructions /Last Dose:patient stated instructed by Beola Cord from dr borden office to stop eliquis on 03-06-2019 in am- called asd requested note from [place] about stopping eliquis ASA / Instructions/ Last Dose : n/a  Patient denies shortness of breath, chest pain, fever, and cough at this phone  interview.

## 2019-03-03 DIAGNOSIS — K5900 Constipation, unspecified: Secondary | ICD-10-CM | POA: Diagnosis not present

## 2019-03-03 DIAGNOSIS — R7309 Other abnormal glucose: Secondary | ICD-10-CM | POA: Diagnosis not present

## 2019-03-03 DIAGNOSIS — Z6826 Body mass index (BMI) 26.0-26.9, adult: Secondary | ICD-10-CM | POA: Diagnosis not present

## 2019-03-03 DIAGNOSIS — R7303 Prediabetes: Secondary | ICD-10-CM | POA: Diagnosis not present

## 2019-03-03 DIAGNOSIS — I10 Essential (primary) hypertension: Secondary | ICD-10-CM | POA: Diagnosis not present

## 2019-03-03 DIAGNOSIS — I2510 Atherosclerotic heart disease of native coronary artery without angina pectoris: Secondary | ICD-10-CM | POA: Diagnosis not present

## 2019-03-03 DIAGNOSIS — E870 Hyperosmolality and hypernatremia: Secondary | ICD-10-CM | POA: Diagnosis not present

## 2019-03-06 ENCOUNTER — Other Ambulatory Visit (HOSPITAL_COMMUNITY)
Admission: RE | Admit: 2019-03-06 | Discharge: 2019-03-06 | Disposition: A | Discharge status: Home / Self Care | Payer: Medicare Other | Source: Ambulatory Visit | Attending: Urology | Admitting: Urology

## 2019-03-06 DIAGNOSIS — Z01812 Encounter for preprocedural laboratory examination: Secondary | ICD-10-CM | POA: Diagnosis not present

## 2019-03-06 DIAGNOSIS — Z20822 Contact with and (suspected) exposure to covid-19: Secondary | ICD-10-CM | POA: Insufficient documentation

## 2019-03-06 LAB — SARS CORONAVIRUS 2 (TAT 6-24 HRS): SARS Coronavirus 2: NEGATIVE

## 2019-03-07 ENCOUNTER — Telehealth: Payer: Self-pay | Admitting: Cardiology

## 2019-03-07 NOTE — Telephone Encounter (Signed)
New message     [facility] Pre-operative Risk Assessment    Request for surgical clearance:  1. What type of surgery is being performed? Cystoscopy, right retrograde pyelogram right ureteroscopy, laser lithotripsy  with right urethral stent placement   2. When is this surgery scheduled? 03/09/19  3. What type of clearance is required (medical clearance vs. Pharmacy clearance to hold med vs. Both)? pharmacy  4. Are there any medications that need to be held prior to surgery and how long?Eliquis 48 hours prior   5. Practice name and name of physician performing surgery? [facility], Dr. Raynelle Bring  6.   What is your office phone number [phone] ext [phone]   7.   What is your office fax number [phone]  8.   Anesthesia type (None, local, MAC, general) ? General    Spencer Cochran 03/07/2019, 12:18 PM  _________________________________________________________________   (provider comments below)

## 2019-03-07 NOTE — Telephone Encounter (Signed)
   Primary Cardiologist: Jenne Campus, MD  Chart reviewed as part of pre-operative protocol coverage. Patient was last seen by Dr. Agustin Cree on 11/03/2018 at which time he was doing relatively well from a cardiac standpoint. 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 I called and spoke with patient today. He reports that he has done well since last visit. He recently had a hernia repair and is still recovering from that. He reports some mild shortness of breath with activity which he attributes mainly to being deconditioned. No chest pain, shortness of breath at rest, palpitations, syncope, or acute CHF symptoms. Able to complete >4.0 METS. Per Revised Cardiac Risk Index, considered low risk. Therefore, based on ACC/AHA guidelines, Spencer Cochran would be at acceptable risk for the planned procedure without further cardiovascular testing.   Per Pharmacy and office protocol, "patient can hold Eliquis for 2 days prior to procedure." Eliquis should be restarted as soon as able following procedure.   I will route this recommendation to the requesting party via Epic fax function and remove from pre-op pool.  Please call with questions.  Darreld Mclean, PA-C 03/07/2019, 1:34 PM

## 2019-03-07 NOTE — Telephone Encounter (Signed)
Patient with diagnosis of afib on Eliquis for anticoagulation.    Procedure: Cystoscopy, right retrograde pyelogram right ureteroscopy, laser lithotripsy  with right urethral stent placement   Date of procedure: 03/09/19  CHADS2-VASc score of  3 (HTN, AGE,  CAD)  CrCl 69 ml/min  Per office protocol, patient can hold Eliquis for 2 days prior to procedure.

## 2019-03-07 NOTE — Progress Notes (Signed)
Requested note about stopping peliquis for 03-09-2019 surgery from connie mabe at [facility].

## 2019-03-08 NOTE — H&P (Signed)
Office Visit Report     02/14/2019   --------------------------------------------------------------------------------   Shaune Pollack  [id number]  DOB: 30-Aug-1945, 74 year old Male  SSN: [id number]   PRIMARY CARE:  Francisco M. Nyra Capes, MD  REFERRING:  Ammie Dalton, NP  PROVIDER:  Raynelle Bring, M.D.  TREATING:  Jiles Crocker, NP  LOCATION:  [facility] [phone]     --------------------------------------------------------------------------------   CC/HPI: 1. Penile lesion  2. Chronic bacteriuria  3. BPH/LUTS  4. Urolithiasis   11/22/2018: He returns today approximately 2 weeks after undergoing an excisional biopsy of the glans penis for the persistent ulcerative lesion of the penis. He has recovered uneventfully and has only very mild discomfort in this area currently. He has resumed his anticoagulation without incident. He follows up today to review his pathology.    01/25/2019: Biopsy of penile lesion with benign pathology. Patient reports that the lesion has not completely healed and is still mildly tender to palpation. He is applying a topical steroid cream previously prescribed by myself twice daily. Reports no drainage or bleeding from the area.   Shortly after his last evaluation with Dr. Alinda Money the patient developed left lower quadrant abdominal pain and swelling that radiated into the left scrotum. Evaluated by general surgery and diagnosed with a left inguinal hernia. He is scheduled for surgical repair on Monday of next week. Continues to have intermittent pain and discomfort to the area usually with position changes and palpation. Denies constipation, nausea/vomiting, fever or chills.   Patient had recent CT imaging done at [facility] for persistent left-sided pain and discomfort. Incidentally two adjacent mildly obstructing 4 and 34mm right distal ureteral calculi was identified. Only minimal hydronephrosis noted. Patient has no pain or  discomfort on that side. Grossly voiding at his baseline without burning or painful urination, visible blood in urine. He denies any interval stone material passage.   02/14/2019: Right ureteral calculi well visualized on the previous KUB imaging study but I deferred additional management of this as he was set to undergo left inguinal hernia repair on 12/28. He returns today for f/u KUB.   At time of last office visit I recommended stopping application of topical steroid cream to his slow healing penile lesion after benign biopsy back in the fall of 2020. He reports this has significantly improved over the course of the past 2 weeks in is no longer having any bleeding, tenderness, or drainage from the area.   He has recovered well from hernia repair endorsing of problems with wound healing, significant pain or discomfort, constipation or nausea/vomiting.   Over the past weekend he did have an episode of hematuria noted with 3 separate voidings and some associated right lower back pain but that has since resolved and not recurred as of today's visit. Voiding at his baseline otherwise. He denies any interval stone material passage.     ALLERGIES: Hydrocodone-Acetaminophen CAPS    MEDICATIONS: Tamsulosin Hcl 0.4 mg capsule 1 capsule PO Daily  Amlodipine Besylate  Atorvastatin Calcium  Carvedilol 25 mg tablet 0 Oral Twice daily  Eliquis  Fish Oil CAPS Oral  Furosemide 40 MG Oral Tablet Oral  Iron 236 mg (27 mg iron) tablet Oral  Omeprazole 20 mg capsule,delayed release Oral  Timolol Maleate 0.5 % drops  Vitamin D 1,250 mcg (50,000 unit) capsule 0 Oral     GU PSH: Biopsy Penis - 11/14/2018 Cysto Dilate Stricture (M or F) - 2013 Cysto Uretero Lithotripsy - 2016  Cystoscopy - 05/11/2017, 2008 Cystoscopy Insert Stent - 2016 Cystoscopy TUIP - 2008 Cystoscopy TURP - 2015 ESWL - 2009 Initial Male VB Sounds - 2015 Locm 300-399Mg /Ml Iodine,1Ml - 05/05/2017, 10/12/2016 Ureteroscopic laser litho,  Left - 06/10/2017       PSH Notes: Cystoscopy With Ureteroscopy With Lithotripsy, Cystoscopy With Insertion Of Ureteral Stent Left, Dilat Male Ureth Strict By Arlyce Dice Dilat Initial, Transurethral Resection Of Prostate (TURP), Cystoscopy For Urethral Stricture, Lithotripsy, Cystoscopy (Diagnostic), Urethra Surgery, Transurethral Incision Of Prostate, Primary Repair Of Ruptured Achilles Tendon, Inguinal Hernia Repair, Appendectomy   NON-GU PSH: Appendectomy - 2007 Cataract surgery, Bilateral Repair Achilles Tendon - 2008     GU PMH: Unil Inguinal Hernia W/O obst or gang,recurrent - 01/25/2019 Ureteral calculus - 01/25/2019 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 - 06/29/2018 Ulcer of penis - 03/30/2018 Gross hematuria - 04/28/2017, Gross hematuria, - 2017 Renal calculus (Stable) - 12/16/2016, Bilateral kidney stones, - 2016 Weak Urinary Stream - 12/16/2016 BPH w/LUTS, Benign localized hyperplasia of prostate with urinary obstruction - 2016 ED due to arterial insufficiency, Erectile dysfunction due to arterial insufficiency - 2016 Primary hypogonadism, Hypogonadism, testicular - 2016 Urethral Stricture, Unspec, Urethral stricture - 2015 Elevated PSA, Elevated prostate specific antigen (PSA) - 2014      PMH Notes:   1) BPH/LUTS: He is s/p a TURP by Dr. Gaynelle Arabian.   Mar 2015: TURP, pathology benign   Current treatment: Observation  Prior treatment: Tamsulosin (was able to stop)   2) Hematuria: He has had recurrent gross hematuria since his TURP. He has been anticoagulated for atrial fibrillation with Eliquis. It has been felt that his prostate has been to most likely source of his hematuria vs. renal stones. He does not smoke.   Jan 2016: CT - B renal stones  Feb 2016: Cystoscopy - Negative  Jan 2017: CT- B renal stones  Feb 2017: Cystoscopy - Negative  Sep 2018: CT - Bilateral non-obstructing renal calculi  Mar 2019: CT - Bilateral renal calculi with  left renal pelvic stone (9 mm)   3) Urolithiasis: He has a history of urolithiasis.   2016: Ureteroscopy by Dr. Gaynelle Arabian  May 2019: Left ureteroscopic lithotripsy (calcium oxalate)   4) Testosterone deficiency: His symptoms include erectile dysfunction and decreased libido.   5) Bacteruria: He has had chronic bacteruria with Streptococcus viridans.   6) Penile lesion: He was noted to have a lesion on the right side of the glans penis in February 2020. This initially appeared to improve with topical therapy but persisted eventually leading to biopsy.   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  Drinks 1 caffeinated drink per day.     Notes: Retired, Never A Smoker, Caffeine Use, Alcohol Use, Death In The Family Father, Death In The Family Mother, Tobacco Use, Marital History - Currently Married, Occupation:   REVIEW OF SYSTEMS:    GU Review Male:   Patient denies frequent urination, hard to postpone urination, burning/ pain with urination, get  up at night to urinate, leakage of urine, stream starts and stops, trouble starting your stream, have to strain to urinate , erection problems, and penile pain.  Gastrointestinal (Upper):   Patient denies nausea, vomiting, and indigestion/ heartburn.  Gastrointestinal (Lower):   Patient denies diarrhea and constipation.  Constitutional:   Patient denies fever,  night sweats, weight loss, and fatigue.  Skin:   Patient denies skin rash/ lesion and itching.  Eyes:   Patient denies blurred vision and double vision.  Ears/ Nose/ Throat:   Patient denies sore throat and sinus problems.  Hematologic/Lymphatic:   Patient denies swollen glands and easy bruising.  Cardiovascular:   Patient denies leg swelling and chest pains.  Respiratory:   Patient denies cough and shortness of breath.  Endocrine:   Patient denies excessive thirst.  Musculoskeletal:   Patient denies back pain and joint pain.  Neurological:   Patient denies headaches and dizziness.  Psychologic:   Patient denies depression and anxiety.   Notes: Updated from previous visit 01/25/2019 with review from patient as noted above.   VITAL SIGNS:      02/14/2019 03:12 PM  Weight 176 lb / 79.83 kg  BP 142/83 mmHg  Heart Rate 85 /min  Temperature 98.4 F / 36.8 C   GU PHYSICAL EXAMINATION:    Penis: His biopsy site has almost completely resolved compared to last assessment.   MULTI-SYSTEM PHYSICAL EXAMINATION:    Constitutional: Well-nourished. No physical deformities. Normally developed. Good grooming.  Neck: Neck symmetrical, not swollen. Normal tracheal position.  Respiratory: No labored breathing, no use of accessory muscles.   Cardiovascular: Normal temperature, normal extremity pulses, no swelling, no varicosities.  Neurologic / Psychiatric: Oriented to time, oriented to place, oriented to person. No depression, no anxiety, no agitation.  Gastrointestinal: No mass, no tenderness, no rigidity, non obese abdomen. Left inguinal hernia incision is healing appropriately. No palpable tenderness on the left side. There is no CVA, flank, lower abdominal tenderness on the right side.   Musculoskeletal: Normal gait and station of head and neck.     PAST DATA REVIEWED:  Source Of History:  Patient, Medical Record Summary  Records Review:   Previous Hospital Records, Previous Patient Records   Urine Test Review:   Urinalysis, Urine Culture  X-Ray Review: KUB: Reviewed Films. Discussed With Patient.  C.T. Abdomen/Pelvis: Reviewed Films. Reviewed Report.     08/15/18 02/21/15 08/28/14 02/10/13 04/03/11 12/18/09 11/15/08 09/16/07  PSA  Total PSA 0.32 ng/mL 0.59  0.72  4.18  3.97  2.90  3.20  0.26   Free PSA   0.23  1.18       % Free PSA   32  28         08/27/14 02/11/13 10/14/11 06/02/11 04/03/11 12/18/09 11/15/08 04/04/08  Hormones  Testosterone, Total 466  425  474.93  298.91  368.19  277.39  451.0  266.0     02/14/19  Urinalysis  Urine Appearance Clear   Urine Color Yellow   Urine Glucose Neg mg/dL  Urine Bilirubin Neg mg/dL  Urine Ketones Neg mg/dL  Urine Specific Gravity 1.010   Urine Blood 2+ ery/uL  Urine pH 6.5   Urine Protein Trace mg/dL  Urine Urobilinogen 0.2 mg/dL  Urine Nitrites Neg   Urine Leukocyte Esterase 1+ leu/uL  Urine WBC/hpf 0 - 5/hpf   Urine RBC/hpf 3 - 10/hpf   Urine Epithelial Cells 0 - 5/hpf   Urine Bacteria NS (Not Seen)   Urine Mucous Not Present  Urine Yeast NS (Not Seen)   Urine Trichomonas Not Present   Urine Cystals NS (Not Seen)   Urine Casts NS (Not Seen)   Urine Sperm Not Present    PROCEDURES:         KUB [id number]  A single view of the abdomen is obtained. Two adjacent right distal ureteral calculi remain grossly unchanged compared to previous imaging study assessment performed on 12/23. They are just distal to the right sacral wing. No additional ureteral calculi noted on today's exam.      . Patient confirmed No Neulasta OnPro Device.           Urinalysis w/Scope Dipstick Dipstick Cont'd Micro  Color: Yellow Bilirubin: Neg mg/dL WBC/hpf: 0 - 5/hpf  Appearance: Clear Ketones: Neg mg/dL RBC/hpf: 3 - 10/hpf  Specific Gravity: 1.010 Blood: 2+ ery/uL Bacteria: NS (Not Seen)  pH: 6.5 Protein: Trace mg/dL Cystals: NS (Not Seen)  Glucose: Neg mg/dL Urobilinogen: 0.2 mg/dL Casts: NS (Not Seen)    Nitrites: Neg Trichomonas:  Not Present    Leukocyte Esterase: 1+ leu/uL Mucous: Not Present      Epithelial Cells: 0 - 5/hpf      Yeast: NS (Not Seen)      Sperm: Not Present    ASSESSMENT:      ICD-10 Details  1 GU:   Ureteral calculus - N20.1 Right, Acute, Uncomplicated  2   Gross hematuria - 123456 Acute, Uncomplicated  3   Ulcer of penis - N48.5 Minor - Significantly improved since discontinuing topical steroid therapy. Almost completely resolved as of today's exam. He will continue to observe this.   PLAN:           Orders Labs Urine Culture          Schedule Return Visit/Planned Activity: Other See Visit Notes - Follow up MD, Schedule Surgery          Document Letter(s):  Created for Patient: Clinical Summary         Notes:   He is recovering well from his recent inguinal hernia repair.  His penile lesion has almost completely resolved at this time aided by him discontinuing topical steroid therapy.   He's done well without any significant exacerbations of right sided pain and discomfort but did have gross hematuria a few days ago that has since resolved. Distal ureteral calculi still present on KUB today. He had only mild obstructive signs on the right at time of CT imaging in mid-December. Renal function has remained stable.   I'll discuss with Dr Alinda Money about management of the distal right ureteral calculi which may include continued observation versus shockwave lithotripsy versus right ureteroscopy. I do have some questions about his candidacy for shockwave lithotripsy due to recent inguinal hernia repair and his ongoing anticoagulation status with Eliquis.   For observation I described the risks which include but are not limited to silent renal damage, life-threatening infection, need for emergent surgery, failure to pass stone, and pain.   For ureteroscopy I described the risks which include heart attack, stroke, pulmonary embolus, death, bleeding, infection, damage to contiguous structures,  positioning injury, ureteral stricture, ureteral avulsion, ureteral injury, need for ureteral stent, inability to perform ureteroscopy, need for an interval procedure, inability to clear stone burden, stent discomfort and pain.   For shockwave lithotripsy I described the risks which include arrhythmia, kidney contusion, kidney hemorrhage, need for transfusion, long-term risk of diabetes or hypertension, back discomfort, flank ecchymosis, flank abrasion, inability to break  up stone, inability to pass stone fragments, Steinstrasse, infection associated with obstructing stones, need for different surgical procedure and possible need for repeat shockwave lithotripsy.   He will contact the office for follow-up instructions in the event he does develop worsening symptomatology in regards to his lower urinary tract symptoms and right-sided pain or discomfort or if he passes stone material prior to me consulting and following up with him after discussion with Dr. Alinda Money. Precautionary urine culture re-sent today in anticipation of possible intervention.    * Signed by Jiles Crocker, NP on 02/14/19 at 6:23 PM (EST)*

## 2019-03-09 ENCOUNTER — Ambulatory Visit (HOSPITAL_BASED_OUTPATIENT_CLINIC_OR_DEPARTMENT_OTHER): Payer: Medicare Other | Admitting: Physician Assistant

## 2019-03-09 ENCOUNTER — Encounter (HOSPITAL_BASED_OUTPATIENT_CLINIC_OR_DEPARTMENT_OTHER)
Admission: RE | Disposition: A | Discharge status: Home / Self Care | Payer: Self-pay | Source: Home / Self Care | Attending: Urology

## 2019-03-09 ENCOUNTER — Ambulatory Visit (HOSPITAL_BASED_OUTPATIENT_CLINIC_OR_DEPARTMENT_OTHER)
Admission: RE | Admit: 2019-03-09 | Discharge: 2019-03-09 | Disposition: A | Discharge status: Home / Self Care | Payer: Medicare Other | Attending: Urology | Admitting: Urology

## 2019-03-09 ENCOUNTER — Other Ambulatory Visit: Payer: Self-pay

## 2019-03-09 ENCOUNTER — Ambulatory Visit: Payer: Medicare Other | Admitting: Cardiology

## 2019-03-09 ENCOUNTER — Encounter (HOSPITAL_BASED_OUTPATIENT_CLINIC_OR_DEPARTMENT_OTHER): Payer: Self-pay | Admitting: Urology

## 2019-03-09 DIAGNOSIS — N485 Ulcer of penis: Secondary | ICD-10-CM | POA: Insufficient documentation

## 2019-03-09 DIAGNOSIS — E7800 Pure hypercholesterolemia, unspecified: Secondary | ICD-10-CM | POA: Diagnosis not present

## 2019-03-09 DIAGNOSIS — N201 Calculus of ureter: Secondary | ICD-10-CM | POA: Diagnosis not present

## 2019-03-09 DIAGNOSIS — Z7901 Long term (current) use of anticoagulants: Secondary | ICD-10-CM | POA: Diagnosis not present

## 2019-03-09 DIAGNOSIS — I2510 Atherosclerotic heart disease of native coronary artery without angina pectoris: Secondary | ICD-10-CM | POA: Diagnosis not present

## 2019-03-09 DIAGNOSIS — Z87442 Personal history of urinary calculi: Secondary | ICD-10-CM | POA: Insufficient documentation

## 2019-03-09 DIAGNOSIS — E785 Hyperlipidemia, unspecified: Secondary | ICD-10-CM | POA: Diagnosis not present

## 2019-03-09 DIAGNOSIS — G4730 Sleep apnea, unspecified: Secondary | ICD-10-CM | POA: Insufficient documentation

## 2019-03-09 DIAGNOSIS — Z79899 Other long term (current) drug therapy: Secondary | ICD-10-CM | POA: Diagnosis not present

## 2019-03-09 DIAGNOSIS — Z885 Allergy status to narcotic agent status: Secondary | ICD-10-CM | POA: Diagnosis not present

## 2019-03-09 DIAGNOSIS — I10 Essential (primary) hypertension: Secondary | ICD-10-CM | POA: Insufficient documentation

## 2019-03-09 DIAGNOSIS — I4891 Unspecified atrial fibrillation: Secondary | ICD-10-CM | POA: Diagnosis not present

## 2019-03-09 DIAGNOSIS — K4090 Unilateral inguinal hernia, without obstruction or gangrene, not specified as recurrent: Secondary | ICD-10-CM | POA: Diagnosis not present

## 2019-03-09 DIAGNOSIS — R8271 Bacteriuria: Secondary | ICD-10-CM | POA: Insufficient documentation

## 2019-03-09 DIAGNOSIS — N132 Hydronephrosis with renal and ureteral calculous obstruction: Secondary | ICD-10-CM | POA: Diagnosis not present

## 2019-03-09 DIAGNOSIS — N401 Enlarged prostate with lower urinary tract symptoms: Secondary | ICD-10-CM | POA: Insufficient documentation

## 2019-03-09 HISTORY — PX: CYSTOSCOPY/URETEROSCOPY/HOLMIUM LASER/STENT PLACEMENT: SHX6546

## 2019-03-09 LAB — POCT I-STAT, CHEM 8
BUN: 16 mg/dL (ref 8–23)
Calcium, Ion: 1.22 mmol/L (ref 1.15–1.40)
Chloride: 106 mmol/L (ref 98–111)
Creatinine, Ser: 0.7 mg/dL (ref 0.61–1.24)
Glucose, Bld: 106 mg/dL — ABNORMAL HIGH (ref 70–99)
HCT: 42 % (ref 39.0–52.0)
Hemoglobin: 14.3 g/dL (ref 13.0–17.0)
Potassium: 3.2 mmol/L — ABNORMAL LOW (ref 3.5–5.1)
Sodium: 145 mmol/L (ref 135–145)
TCO2: 27 mmol/L (ref 22–32)

## 2019-03-09 SURGERY — CYSTOSCOPY/URETEROSCOPY/HOLMIUM LASER/STENT PLACEMENT
Anesthesia: General | Laterality: Right

## 2019-03-09 MED ORDER — CEFAZOLIN SODIUM-DEXTROSE 2-4 GM/100ML-% IV SOLN
2.0000 g | Freq: Once | INTRAVENOUS | Status: AC
  Administered 2019-03-09: 2 g via INTRAVENOUS
  Filled 2019-03-09: qty 100

## 2019-03-09 MED ORDER — CEFAZOLIN SODIUM-DEXTROSE 2-4 GM/100ML-% IV SOLN
INTRAVENOUS | Status: AC
  Filled 2019-03-09: qty 100

## 2019-03-09 MED ORDER — MIDAZOLAM HCL 2 MG/2ML IJ SOLN
INTRAMUSCULAR | Status: DC | PRN
  Administered 2019-03-09: 1 mg via INTRAVENOUS

## 2019-03-09 MED ORDER — PROPOFOL 10 MG/ML IV BOLUS
INTRAVENOUS | Status: AC
  Filled 2019-03-09: qty 20

## 2019-03-09 MED ORDER — MIDAZOLAM HCL 2 MG/2ML IJ SOLN
INTRAMUSCULAR | Status: AC
  Filled 2019-03-09: qty 2

## 2019-03-09 MED ORDER — EPHEDRINE 5 MG/ML INJ
INTRAVENOUS | Status: AC
  Filled 2019-03-09: qty 10

## 2019-03-09 MED ORDER — ONDANSETRON HCL 4 MG/2ML IJ SOLN
INTRAMUSCULAR | Status: AC
  Filled 2019-03-09: qty 2

## 2019-03-09 MED ORDER — ACETAMINOPHEN 325 MG PO TABS
325.0000 mg | ORAL_TABLET | ORAL | Status: DC | PRN
  Filled 2019-03-09: qty 2

## 2019-03-09 MED ORDER — FENTANYL CITRATE (PF) 100 MCG/2ML IJ SOLN
INTRAMUSCULAR | Status: DC | PRN
  Administered 2019-03-09: 50 ug via INTRAVENOUS

## 2019-03-09 MED ORDER — IOHEXOL 300 MG/ML  SOLN
INTRAMUSCULAR | Status: DC | PRN
  Administered 2019-03-09: 1 mL via URETHRAL

## 2019-03-09 MED ORDER — PROPOFOL 500 MG/50ML IV EMUL
INTRAVENOUS | Status: DC | PRN
  Administered 2019-03-09: 100 mg via INTRAVENOUS

## 2019-03-09 MED ORDER — MEPERIDINE HCL 25 MG/ML IJ SOLN
6.2500 mg | INTRAMUSCULAR | Status: DC | PRN
  Filled 2019-03-09: qty 1

## 2019-03-09 MED ORDER — KETOROLAC TROMETHAMINE 15 MG/ML IJ SOLN
15.0000 mg | Freq: Once | INTRAMUSCULAR | Status: DC
  Filled 2019-03-09: qty 1

## 2019-03-09 MED ORDER — LACTATED RINGERS IV SOLN
INTRAVENOUS | Status: DC
  Administered 2019-03-09: 06:00:00 1000 mL via INTRAVENOUS
  Filled 2019-03-09: qty 1000

## 2019-03-09 MED ORDER — FENTANYL CITRATE (PF) 100 MCG/2ML IJ SOLN
25.0000 ug | INTRAMUSCULAR | Status: DC | PRN
  Filled 2019-03-09: qty 1

## 2019-03-09 MED ORDER — ACETAMINOPHEN 160 MG/5ML PO SOLN
325.0000 mg | ORAL | Status: DC | PRN
  Filled 2019-03-09: qty 20.3

## 2019-03-09 MED ORDER — SODIUM CHLORIDE 0.9 % IR SOLN
Status: DC | PRN
  Administered 2019-03-09: 3000 mL via INTRAVESICAL

## 2019-03-09 MED ORDER — FENTANYL CITRATE (PF) 100 MCG/2ML IJ SOLN
INTRAMUSCULAR | Status: AC
  Filled 2019-03-09: qty 2

## 2019-03-09 MED ORDER — DEXAMETHASONE SODIUM PHOSPHATE 10 MG/ML IJ SOLN
INTRAMUSCULAR | Status: DC | PRN
  Administered 2019-03-09: 10 mg via INTRAVENOUS

## 2019-03-09 MED ORDER — LIDOCAINE 2% (20 MG/ML) 5 ML SYRINGE
INTRAMUSCULAR | Status: AC
  Filled 2019-03-09: qty 5

## 2019-03-09 MED ORDER — TRAMADOL HCL 50 MG PO TABS: 50.0000 mg | ORAL_TABLET | Freq: Four times a day (QID) | ORAL | 0 refills | Status: DC | PRN

## 2019-03-09 MED ORDER — PHENYLEPHRINE 40 MCG/ML (10ML) SYRINGE FOR IV PUSH (FOR BLOOD PRESSURE SUPPORT)
PREFILLED_SYRINGE | INTRAVENOUS | Status: AC
  Filled 2019-03-09: qty 10

## 2019-03-09 MED ORDER — DEXAMETHASONE SODIUM PHOSPHATE 10 MG/ML IJ SOLN
INTRAMUSCULAR | Status: AC
  Filled 2019-03-09: qty 1

## 2019-03-09 MED ORDER — DEXAMETHASONE SODIUM PHOSPHATE 10 MG/ML IJ SOLN
8.0000 mg | Freq: Once | INTRAMUSCULAR | Status: DC | PRN
  Filled 2019-03-09: qty 0.8

## 2019-03-09 SURGICAL SUPPLY — 19 items
BAG URO CATCHER STRL LF (MISCELLANEOUS) ×2 IMPLANT
BASKET ZERO TIP NITINOL 2.4FR (BASKET) ×2 IMPLANT
CATH INTERMIT  6FR 70CM (CATHETERS) ×2 IMPLANT
CLOTH BEACON ORANGE TIMEOUT ST (SAFETY) ×2 IMPLANT
FIBER LASER FLEXIVA 365 (UROLOGICAL SUPPLIES) ×2 IMPLANT
FIBER LASER TRAC TIP (UROLOGICAL SUPPLIES) IMPLANT
GLOVE BIOGEL M STRL SZ7.5 (GLOVE) ×2 IMPLANT
GOWN STRL REUS W/TWL LRG LVL3 (GOWN DISPOSABLE) ×4 IMPLANT
GUIDEWIRE ANG ZIPWIRE 038X150 (WIRE) ×2 IMPLANT
GUIDEWIRE STR DUAL SENSOR (WIRE) ×2 IMPLANT
IV NS 1000ML (IV SOLUTION) ×1
IV NS 1000ML BAXH (IV SOLUTION) ×1 IMPLANT
KIT TURNOVER CYSTO (KITS) ×2 IMPLANT
MANIFOLD NEPTUNE II (INSTRUMENTS) ×2 IMPLANT
PACK CYSTO (CUSTOM PROCEDURE TRAY) ×2 IMPLANT
SHEATH URETERAL 12FRX35CM (MISCELLANEOUS) IMPLANT
STENT URET 6FRX24 CONTOUR (STENTS) ×2 IMPLANT
TUBING CONNECTING 10 (TUBING) ×2 IMPLANT
TUBING UROLOGY SET (TUBING) ×2 IMPLANT

## 2019-03-09 NOTE — Transfer of Care (Signed)
Immediate Anesthesia Transfer of Care Note  Patient: Spencer Cochran  Procedure(s) Performed: Procedure(s) with comments: CYSTOSCOPY/RETROGRADE/URETEROSCOPY/HOLMIUM LASER/STENT PLACEMENT (Right) - ONLY NEEDS 22 MIN  Patient Location: PACU  Anesthesia Type:General  Level of Consciousness: Alert, Awake, Oriented  Airway & Oxygen Therapy: Patient Spontanous Breathing  Post-op Assessment: Report given to RN  Post vital signs: Reviewed and stable  Last Vitals:  Vitals:   03/09/19 0542  BP: 111/72  Pulse: 93  Resp: 14  Temp: 36.7 C  SpO2: 123456    Complications: No apparent anesthesia complications

## 2019-03-09 NOTE — Anesthesia Postprocedure Evaluation (Signed)
Anesthesia Post Note  Patient: Nyaire Amjad  Procedure(s) Performed: CYSTOSCOPY/RETROGRADE/URETEROSCOPY/HOLMIUM LASER/STENT PLACEMENT (Right )     Patient location during evaluation: Phase II Anesthesia Type: General Level of consciousness: awake Pain management: pain level controlled Vital Signs Assessment: post-procedure vital signs reviewed and stable Respiratory status: spontaneous breathing Cardiovascular status: stable Postop Assessment: no apparent nausea or vomiting Anesthetic complications: no    Last Vitals:  Vitals:   03/09/19 0909 03/09/19 0913  BP:    Pulse: 78 92  Resp: 11 13  Temp:  37 C  SpO2: 98% 96%    Last Pain:  Vitals:   03/09/19 0913  PainSc: 0-No pain   Pain Goal: Patients Stated Pain Goal: 7 (03/09/19 0608)                 Huston Foley

## 2019-03-09 NOTE — Interval H&P Note (Signed)
History and Physical Interval Note:  03/09/2019 6:18 AM  Spencer Cochran  has presented today for surgery, with the diagnosis of RIGHT URETERAL CALCULUS.  The various methods of treatment have been discussed with the patient and family. After consideration of risks, benefits and other options for treatment, the patient has consented to  Procedure(s) with comments: CYSTOSCOPY/RETROGRADE/URETEROSCOPY/HOLMIUM LASER/STENT PLACEMENT (Right) - ONLY NEEDS 60 MIN as a surgical intervention.  The patient's history has been reviewed, patient examined, no change in status, stable for surgery.  I have reviewed the patient's chart and labs.  Questions were answered to the patient's satisfaction.     [name]

## 2019-03-09 NOTE — Anesthesia Procedure Notes (Signed)
Procedure Name: LMA Insertion Date/Time: 03/09/2019 7:30 AM Performed by: Gerald Leitz, CRNA Pre-anesthesia Checklist: Patient identified, Patient being monitored, Timeout performed, Emergency Drugs available and Suction available Patient Re-evaluated:Patient Re-evaluated prior to induction Oxygen Delivery Method: Circle system utilized Preoxygenation: Pre-oxygenation with 100% oxygen Induction Type: IV induction Ventilation: Mask ventilation without difficulty LMA: LMA inserted LMA Size: 5.0 Tube type: Oral Number of attempts: 1 Placement Confirmation: positive ETCO2 and breath sounds checked- equal and bilateral Tube secured with: Tape Dental Injury: Teeth and Oropharynx as per pre-operative assessment

## 2019-03-09 NOTE — Op Note (Signed)
Preoperative diagnosis: Right ureteral calculi  Postoperative diagnosis: Right ureteral calculi  Procedure:  1. Cystoscopy 2. Right ureteroscopy and stone removal 3. Ureteroscopic laser lithotripsy 4. Right ureteral stent placement (6 x24 - no string) 5. Right retrograde pyelography with interpretation  Surgeon: Pryor Curia. M.D.  Anesthesia: General  Complications: None  Intraoperative findings: Right retrograde pyelography demonstrated a filling defect within the distal right ureter consistent with the patient's known calculus without other abnormalities.  EBL: Minimal  Specimens: 1. Right ureteral calculi  Disposition of specimens: [facility] for stone analysis  Indication: Spencer Cochran is a 74 y.o. year old patient with the distal right ureteral calculi. After reviewing the management options for treatment, the patient elected to proceed with the above surgical procedure(s). We have discussed the potential benefits and risks of the procedure, side effects of the proposed treatment, the likelihood of the patient achieving the goals of the procedure, and any potential problems that might occur during the procedure or recuperation. Informed consent has been obtained.  Description of procedure:  The patient was taken to the operating room and general anesthesia was induced.  The patient was placed in the dorsal lithotomy position, prepped and draped in the usual sterile fashion, and preoperative antibiotics were administered. A preoperative time-out was performed.   Cystourethroscopy was performed. There was an abnormal urethral meatus consistent with his prior surgical history. The patient's urethra was examined and was normal. The bladder was then systematically examined in its entirety. There was no evidence for any bladder tumors, stones, or other mucosal pathology.    Attention then turned to the right ureteral orifice and a ureteral catheter was  used to intubate the ureteral orifice.  Omnipaque contrast was injected through the ureteral catheter and a retrograde pyelogram was performed with findings as dictated above.  A 0.38 sensor guidewire was then advanced up the right ureter into the renal pelvis under fluoroscopic guidance. The 6 Fr semirigid ureteroscope was then advanced into the ureter next to the guidewire and the calculus was identified.   The stone was then fragmented with the 365 micron holmium laser fiber on a setting of 0.6 J and frequency of 6 Hz.   All stones were then removed from the ureter with a zero tip nitinol basket.  Reinspection of the ureter revealed no remaining visible stones or fragments.   The wire was then backloaded through the cystoscope and a ureteral stent was advance over the wire using Seldinger technique.  The stent was positioned appropriately under fluoroscopic and cystoscopic guidance.  The wire was then removed with an adequate stent curl noted in the renal pelvis as well as in the bladder.  The bladder was then emptied and the procedure ended.  The patient appeared to tolerate the procedure well and without complications.  The patient was able to be awakened and transferred to the recovery unit in satisfactory condition.

## 2019-03-09 NOTE — Discharge Instructions (Addendum)
  1. You may see some blood in the urine and may have some burning with urination for 48-72 hours. You also may notice that you have to urinate more frequently or urgently after your procedure which is normal.  2. You should call should you develop an inability urinate, fever > 101, persistent nausea and vomiting that prevents you from eating or drinking to stay hydrated.  3. If you have a stent, you will likely urinate more frequently and urgently until the stent is removed and you may experience some discomfort/pain in the lower abdomen and flank especially when urinating. You may take pain medication prescribed to you if needed for pain. You may also intermittently have blood in the urine until the stent is removed. 4. You may remove your stent on Monday morning.  Simply pull the string that is taped to your body and the stent will easily come out.  This may be best done in the shower as some urine may come out with the stent.  Usually you will feel relief once the stent is removed, but occasionally patients can develop pain due to residual swelling of the ureter that may temporarily obstruct the kidney.  This can be managed by taking pain medication and it will typically resolve with time.  Please do not hesitate to call if you have pain that is not controlled with your pain medication or does not improved within 24-48 hours.       5.   You may restart Eliquis in 48-72 hours if no bleeding in urine.   Post Anesthesia Home Care Instructions  Activity: Get plenty of rest for the remainder of the day. A responsible individual must stay with you for 24 hours following the procedure.  For the next 24 hours, DO NOT: -Drive a car -Paediatric nurse -Drink alcoholic beverages -Take any medication unless instructed by your physician -Make any legal decisions or sign important papers.  Meals: Start with liquid foods such as gelatin or soup. Progress to regular foods as tolerated. Avoid greasy, spicy,  heavy foods. If nausea and/or vomiting occur, drink only clear liquids until the nausea and/or vomiting subsides. Call your physician if vomiting continues.  Special Instructions/Symptoms: Your throat may feel dry or sore from the anesthesia or the breathing tube placed in your throat during surgery. If this causes discomfort, gargle with warm salt water. The discomfort should disappear within 24 hours.  If you had a scopolamine patch placed behind your ear for the management of post- operative nausea and/or vomiting:  1. The medication in the patch is effective for 72 hours, after which it should be removed.  Wrap patch in a tissue and discard in the trash. Wash hands thoroughly with soap and water. 2. You may remove the patch earlier than 72 hours if you experience unpleasant side effects which may include dry mouth, dizziness or visual disturbances. 3. Avoid touching the patch. Wash your hands with soap and water after contact with the patch.

## 2019-03-09 NOTE — Anesthesia Preprocedure Evaluation (Addendum)
Anesthesia Evaluation  Patient identified by MRN, date of birth, ID band Patient awake    Reviewed: Allergy & Precautions, NPO status , Patient's Chart, lab work & pertinent test results, reviewed documented beta blocker date and time   Airway Mallampati: I       Dental no notable dental hx. (+) Teeth Intact   Pulmonary    Pulmonary exam normal breath sounds clear to auscultation       Cardiovascular hypertension, Pt. on medications and Pt. on home beta blockers Normal cardiovascular exam Rhythm:Regular Rate:Normal     Neuro/Psych negative neurological ROS  negative psych ROS   GI/Hepatic Neg liver ROS,   Endo/Other  negative endocrine ROS  Renal/GU   negative genitourinary   Musculoskeletal   Abdominal Normal abdominal exam  (+)   Peds  Hematology negative hematology ROS (+)   Anesthesia Other Findings   Reproductive/Obstetrics                             Anesthesia Physical Anesthesia Plan  ASA: II  Anesthesia Plan: General   Post-op Pain Management:    Induction: Intravenous  PONV Risk Score and Plan: 2 and Ondansetron  Airway Management Planned: LMA  Additional Equipment: None  Intra-op Plan:   Post-operative Plan:   Informed Consent: I have reviewed the patients History and Physical, chart, labs and discussed the procedure including the risks, benefits and alternatives for the proposed anesthesia with the patient or authorized representative who has indicated his/her understanding and acceptance.     Dental advisory given  Plan Discussed with: CRNA  Anesthesia Plan Comments:         Anesthesia Quick Evaluation

## 2019-03-10 NOTE — Progress Notes (Signed)
Pt states he was up most of the night with pain and frequency, incontinence noted as well using depends. Not hurting at present but voiding very small amounts. Encouraged pt to call surgeons office to see if medication can be given for bladder spasms or office visit.

## 2019-03-13 DIAGNOSIS — N201 Calculus of ureter: Secondary | ICD-10-CM | POA: Diagnosis not present

## 2019-03-16 ENCOUNTER — Ambulatory Visit (INDEPENDENT_AMBULATORY_CARE_PROVIDER_SITE_OTHER): Payer: Medicare Other | Admitting: Cardiology

## 2019-03-16 ENCOUNTER — Other Ambulatory Visit: Payer: Self-pay

## 2019-03-16 ENCOUNTER — Encounter: Payer: Self-pay | Admitting: Cardiology

## 2019-03-16 VITALS — BP 90/60 | HR 89 | Ht 69.0 in | Wt 173.0 lb

## 2019-03-16 DIAGNOSIS — E785 Hyperlipidemia, unspecified: Secondary | ICD-10-CM | POA: Diagnosis not present

## 2019-03-16 DIAGNOSIS — R351 Nocturia: Secondary | ICD-10-CM

## 2019-03-16 DIAGNOSIS — I4821 Permanent atrial fibrillation: Secondary | ICD-10-CM

## 2019-03-16 DIAGNOSIS — I2510 Atherosclerotic heart disease of native coronary artery without angina pectoris: Secondary | ICD-10-CM

## 2019-03-16 DIAGNOSIS — Z125 Encounter for screening for malignant neoplasm of prostate: Secondary | ICD-10-CM

## 2019-03-16 DIAGNOSIS — R634 Abnormal weight loss: Secondary | ICD-10-CM

## 2019-03-16 NOTE — Patient Instructions (Signed)
Medication Instructions:  Your physician recommends that you continue on your current medications as directed. Please refer to the Current Medication list given to you today.  *If you need a refill on your cardiac medications before your next appointment, please call your pharmacy*  Lab Work: Your physician recommends that you return for lab work today: cmp, tsh, psa, cbc   If you have labs (blood work) drawn today and your tests are completely normal, you will receive your results only by: Marland Kitchen MyChart Message (if you have MyChart) OR . A paper copy in the mail If you have any lab test that is abnormal or we need to change your treatment, we will call you to review the results.  Testing/Procedures: None.   Follow-Up: At [facility], you and your health needs are our priority.  As part of our continuing mission to provide you with exceptional heart care, we have created designated Provider Care Teams.  These Care Teams include your primary Cardiologist (physician) and Advanced Practice Providers (APPs -  Physician Assistants and Nurse Practitioners) who all work together to provide you with the care you need, when you need it.  Your next appointment:   2 month(s)  The format for your next appointment:   In Person  Provider:   Jenne Campus, MD  Other Instructions  Dr. Agustin Cree has referred you back to Dr. Melina Copa with GI. They should call you within 1 week. If not please call our office.

## 2019-03-16 NOTE — Addendum Note (Signed)
Addended by: Ashok Norris on: 03/16/2019 11:42 AM   Modules accepted: Orders

## 2019-03-16 NOTE — Progress Notes (Signed)
Cardiology Office Note:    Date:  03/16/2019   ID:  Spencer Cochran, DOB [birth year], MRN [id number]  PCP:  Maryella Shivers, MD  Cardiologist:  Jenne Campus, MD    Referring MD: Maryella Shivers, MD   Chief Complaint  Patient presents with  . Follow-up    History of Present Illness:    Spencer Cochran is a 74 y.o. male with past medical history significant for remote coronary artery disease many years ago, permanent atrial fibrillation, essential hypertension, dyslipidemia, prediabetes.  He comes to my office today to follow-up.  He looks very rough.  He lost significant amount of weight.  He complained of lack of appetite.  He is complaining of being weak tired and exhausted.  He used to go to the gym on a regular basis but does not do it because of fatigue and tiredness.  Denies have any cardiac complaint meaning there is no shortness of breath, there is no chest pain.  But overall he looks almost cachectic. Recently he ended up having hernia surgery done which was successful.  Also had some penile lesion removed which was benign, also had surgery for kidney stones and that was also successful.  Past Medical History:  Diagnosis Date  . Arthritis knees and ankle  . Asymptomatic gallstones   . Cancer ([place])    skin   . Cataract immature BILATERAL EYES  . Coronary artery disease CARDIOLOGIST-  DR Agustin Cree  [facility])---  LAST VISIT 04-29-2011   DENIES CARDIAC SYMPTOMS  . DDD (degenerative disc disease), lumbar   . Dysrhythmia    A Fib   . Frequency of urination   . GERD (gastroesophageal reflux disease)   . Glaucoma    both eyes  . History of kidney stones   . Hypertension   . 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 Left ventricular diastolic dysfunction PER ECHO 03-31-2011  W/ CHART  . Nocturia   . OSA on CPAP    wears cpap  . Pre-diabetes    last hemaglobin a 1 c was 5.6  . Unsteadiness   . Urethral stricture     Past Surgical  History:  Procedure Laterality Date  . APPENDECTOMY  1972  . CARDIOVASCULAR STRESS TEST  04-29-2010   DR KRASAWSKI   NO EVIDENCE ISCHEMIA/ NORMAL LVSF AND WALL MOTION/ EF 63%  . CERVICAL FUSION  2006   C3 - 6  . colonscopy     . CYSTO/ URETHRAL DILATION/ TRANSURETHRAL INCISIONOF PROSTATE  01-13-2007  . CYSTOSCOPY  2005  . CYSTOSCOPY W/ RETROGRADES  06/12/2011   Procedure: CYSTOSCOPY WITH RETROGRADE PYELOGRAM;  Surgeon: Ailene Rud, MD;  Location: [facility];  Service: Urology;  Laterality: N/A;  cysto, urethral dilation, right retrograde pyelogram    . CYSTOSCOPY WITH RETROGRADE PYELOGRAM, URETEROSCOPY AND STENT PLACEMENT Left 03/08/2014   Procedure: CYSTOSCOPY WITH LEFT RETROGRADE PYELOGRAM/LEFT  URETEROSCOPY;  Surgeon: Ailene Rud, MD;  Location: [facility];  Service: Urology;  Laterality: Left;  . CYSTOSCOPY WITH URETHRAL DILATATION N/A 04/20/2013   Procedure: CYSTOSCOPY WITH URETHRAL DILATATION;  Surgeon: Ailene Rud, MD;  Location: [facility];  Service: Urology;  Laterality: N/A;  . CYSTOSCOPY/URETEROSCOPY/HOLMIUM LASER/STENT PLACEMENT Left 06/10/2017   Procedure: CYSTOSCOPY/RETROGRADE/URETEROSCOPY/HOLMIUM LASER/STENT PLACEMENT;  Surgeon: Raynelle Bring, MD;  Location: [facility];  Service: Urology;  Laterality: Left;  . CYSTOSCOPY/URETEROSCOPY/HOLMIUM LASER/STENT PLACEMENT Right 03/09/2019   Procedure: CYSTOSCOPY/RETROGRADE/URETEROSCOPY/HOLMIUM LASER/STENT PLACEMENT;  Surgeon: Raynelle Bring, MD;  Location: [facility];  Service: Urology;  Laterality: Right;  ONLY NEEDS 60 MIN  . EXTRACORPOREAL SHOCK WAVE LITHOTRIPSY  01-17-2007   LEFT  . HOLMIUM LASER APPLICATION Left [id number]   Procedure: LASER OF LEFT RENAL PELVIC STONE;  Surgeon: Ailene Rud, MD;  Location: [facility];  Service: Urology;  Laterality: Left;  . INGUINAL HERNIA REPAIR  2003  . JOINT REPLACEMENT     total knee right 03-01-17  . LEFT ACHILLES TENDON REPAIR  1992  . MASS  EXCISION N/A 11/14/2018   Procedure: EXCISIONAL BIOPSY OF GLANS PENIS;  Surgeon: Raynelle Bring, MD;  Location: [facility];  Service: Urology;  Laterality: N/A;  ONLY NEEDS 60 MIN  . NASAL SINUS SURGERY  2012  . PENILE SURGERY  OF MEATUS  1955  . TRANSTHORACIC ECHOCARDIOGRAM  03-31-2011   NORMAL LVEF  59%/ TRIVIAL MR/ DIASTOLIC DYSFUNCTION/ MODERATE LVH  . TRANSURETHRAL RESECTION OF PROSTATE N/A 04/20/2013   Procedure: TRANSURETHRAL RESECTION OF THE PROSTATE WITH GYRUS INSTRUMENTS;  Surgeon: Ailene Rud, MD;  Location: [facility];  Service: Urology;  Laterality: N/A;    Current Medications: Current Meds  Medication Sig  . acetaminophen (TYLENOL) 650 MG CR tablet Take 1,300 mg by mouth daily as needed for pain.  Marland Kitchen amLODipine-benazepril (LOTREL) 5-40 MG capsule TAKE 1 CAPSULE DAILY (Patient taking differently: Take 1 capsule by mouth daily. )  . atorvastatin (LIPITOR) 40 MG tablet Take 40 mg by mouth at bedtime.   . carvedilol (COREG) 25 MG tablet Take 1 tablet (25 mg total) by mouth 2 (two) times daily with a meal.  . ferrous sulfate 325 (65 FE) MG tablet Take 325 mg by mouth daily.  . furosemide (LASIX) 40 MG tablet TAKE 1 TABLET EVERY OTHER DAY  . nitroGLYCERIN (NITROSTAT) 0.4 MG SL tablet Place 0.4 mg under the tongue every 5 (five) minutes x 3 doses as needed for chest pain.   . Omega-3 Fatty Acids (OMEGA 3 PO) Take 520 mg by mouth daily.  Marland Kitchen omeprazole (PRILOSEC) 20 MG capsule Take 20 mg by mouth daily before breakfast.   . timolol (TIMOPTIC) 0.5 % ophthalmic solution Place 1 drop into both eyes 2 (two) times daily.   Marland Kitchen triamcinolone cream (KENALOG) 0.1 % Apply 1 application topically daily as needed (skin irritation).      Allergies:   Hydrocodone   Social History   Socioeconomic History  . Marital status: Married    Spouse name: Not on file  . Number of children: 2  . Years of education: some college  . Highest education level: Not on file  Occupational History  .  Occupation: retired Social research officer, government  Tobacco Use  . Smoking status: Never Smoker  . Smokeless tobacco: Never Used  Substance and Sexual Activity  . Alcohol use: No  . Drug use: No  . Sexual activity: Yes  Other Topics Concern  . Not on file  Social History Narrative   Lives at home with his wife.   Right-handed.   Caffeine use:  32-40 ounces caffeine per day.   Social Determinants of Health   Financial Resource Strain:   . Difficulty of Paying Living Expenses: Not on file  Food Insecurity:   . Worried About Charity fundraiser in the Last Year: Not on file  . Ran Out of Food in the Last Year: Not on file  Transportation Needs:   . Lack of Transportation (Medical): Not on file  . Lack of Transportation (Non-Medical): Not on file  Physical Activity:   . Days of Exercise per Week: Not on file  . Minutes of Exercise per Session: Not on file  Stress:   . Feeling of Stress : Not on file  Social Connections:   . Frequency of Communication with Friends and Family: Not on file  . Frequency of Social Gatherings with Friends and Family: Not on file  . Attends Religious Services: Not on file  . Active Member of Clubs or Organizations: Not on file  . Attends Archivist Meetings: Not on file  . Marital Status: Not on file     Family History: The patient's family history includes Breast cancer in his mother; Cirrhosis in his father. ROS:   Please see the history of present illness.    All 14 point review of systems negative except as described per history of present illness  EKGs/Labs/Other Studies Reviewed:      Recent Labs: 07/18/2018: ALT 13; TSH 0.647 11/07/2018: Platelets 297 03/09/2019: BUN 16; Creatinine, Ser 0.70; Hemoglobin 14.3; Potassium 3.2; Sodium 145  Recent Lipid Panel No results found for: CHOL, TRIG, HDL, CHOLHDL, VLDL, LDLCALC, LDLDIRECT  Physical Exam:    VS:  BP 90/60 (BP Location: Left Arm, Patient Position: Sitting, Cuff Size: Normal)   Pulse 89   Ht  5\' 9"  (1.753 m)   Wt 173 lb (78.5 kg)   SpO2 99%   BMI 25.55 kg/m     Wt Readings from Last 3 Encounters:  03/16/19 173 lb (78.5 kg)  03/09/19 171 lb 9.6 oz (77.8 kg)  11/14/18 194 lb (88 kg)     GEN:  Well nourished, well developed in no acute distress HEENT: Normal NECK: No JVD; No carotid bruits LYMPHATICS: No lymphadenopathy CARDIAC: Irregularly irregular, no murmurs, no rubs, no gallops RESPIRATORY:  Clear to auscultation without rales, wheezing or rhonchi  ABDOMEN: Soft, non-tender, non-distended MUSCULOSKELETAL:  No edema; No deformity  SKIN: Warm and dry LOWER EXTREMITIES: no swelling NEUROLOGIC:  Alert and oriented x 3 PSYCHIATRIC:  Normal affect   ASSESSMENT:    1. Permanent atrial fibrillation ([place])   2. Coronary artery disease involving native coronary artery of native heart without angina pectoris   3. Dyslipidemia    PLAN:    In order of problems listed above:  1. Permanent atrial fibrillation.  Doing well from that continue rate appears to be controlled.  Anticoagulated which I will continue. 2. Coronary artery disease stable but I am really worried about overall his condition which seems to be deteriorating.  I contacted his primary care physician to look for any information about what being done.  It looks like he only got hemoglobin A1c and and Chem-7 done.  I will ask him to have complete metabolic panel, PSA, TSH as well as CBC.  I did review his echocardiogram that being done just in October which showed normal left ventricle ejection fraction and large aorta. 3. Dyslipidemia: He is on atorvastatin which I will continue for now.  But again I will check his liver function test. 4. Progressive and quite dramatic weight loss.  Lack of appetite strongly advised him to follow-up with Dr. Melina Copa who is his gastroenterologist.  He may require rate upper GI as well as colonoscopy.   Medication Adjustments/Labs and Tests Ordered: Current medicines are reviewed at  length with the patient today.  Concerns regarding medicines are outlined above.  No orders of the defined types were placed in this encounter.  Medication changes: No orders of the defined  types were placed in this encounter.   Signed, Park Liter, MD, [facility] 03/16/2019 11:07 AM    Early

## 2019-03-17 LAB — CBC
Hematocrit: 36.9 % — ABNORMAL LOW (ref 37.5–51.0)
Hemoglobin: 12.4 g/dL — ABNORMAL LOW (ref 13.0–17.7)
MCH: 32.8 pg (ref 26.6–33.0)
MCHC: 33.6 g/dL (ref 31.5–35.7)
MCV: 98 fL — ABNORMAL HIGH (ref 79–97)
Platelets: 253 10*3/uL (ref 150–450)
RBC: 3.78 x10E6/uL — ABNORMAL LOW (ref 4.14–5.80)
RDW: 12.3 % (ref 11.6–15.4)
WBC: 6.9 10*3/uL (ref 3.4–10.8)

## 2019-03-17 LAB — COMPREHENSIVE METABOLIC PANEL
ALT: 61 IU/L — ABNORMAL HIGH (ref 0–44)
AST: 91 IU/L — ABNORMAL HIGH (ref 0–40)
Albumin/Globulin Ratio: 0.9 — ABNORMAL LOW (ref 1.2–2.2)
Albumin: 2.9 g/dL — ABNORMAL LOW (ref 3.7–4.7)
Alkaline Phosphatase: 836 IU/L — ABNORMAL HIGH (ref 39–117)
BUN/Creatinine Ratio: 21 (ref 10–24)
BUN: 16 mg/dL (ref 8–27)
Bilirubin Total: 2.9 mg/dL — ABNORMAL HIGH (ref 0.0–1.2)
CO2: 29 mmol/L (ref 20–29)
Calcium: 8.7 mg/dL (ref 8.6–10.2)
Chloride: 103 mmol/L (ref 96–106)
Creatinine, Ser: 0.75 mg/dL — ABNORMAL LOW (ref 0.76–1.27)
GFR calc Af Amer: 104 mL/min/{1.73_m2} (ref >59)
GFR calc non Af Amer: 90 mL/min/{1.73_m2} (ref >59)
Globulin, Total: 3.2 g/dL (ref 1.5–4.5)
Glucose: 115 mg/dL — ABNORMAL HIGH (ref 65–99)
Potassium: 4 mmol/L (ref 3.5–5.2)
Sodium: 141 mmol/L (ref 134–144)
Total Protein: 6.1 g/dL (ref 6.0–8.5)

## 2019-03-17 LAB — PSA: Prostate Specific Ag, Serum: 0.3 ng/mL (ref 0.0–4.0)

## 2019-03-17 LAB — TSH: TSH: 0.643 u[IU]/mL (ref 0.450–4.500)

## 2019-03-23 ENCOUNTER — Ambulatory Visit: Payer: Medicare Other | Admitting: Sports Medicine

## 2019-03-29 DIAGNOSIS — Z6825 Body mass index (BMI) 25.0-25.9, adult: Secondary | ICD-10-CM | POA: Diagnosis not present

## 2019-03-29 DIAGNOSIS — R634 Abnormal weight loss: Secondary | ICD-10-CM | POA: Diagnosis not present

## 2019-03-29 DIAGNOSIS — R748 Abnormal levels of other serum enzymes: Secondary | ICD-10-CM | POA: Diagnosis not present

## 2019-04-01 DIAGNOSIS — R748 Abnormal levels of other serum enzymes: Secondary | ICD-10-CM | POA: Diagnosis not present

## 2019-04-01 DIAGNOSIS — K8020 Calculus of gallbladder without cholecystitis without obstruction: Secondary | ICD-10-CM | POA: Diagnosis not present

## 2019-04-02 DIAGNOSIS — E870 Hyperosmolality and hypernatremia: Secondary | ICD-10-CM | POA: Diagnosis not present

## 2019-04-02 DIAGNOSIS — I2510 Atherosclerotic heart disease of native coronary artery without angina pectoris: Secondary | ICD-10-CM | POA: Diagnosis not present

## 2019-04-02 DIAGNOSIS — R7303 Prediabetes: Secondary | ICD-10-CM | POA: Diagnosis not present

## 2019-04-04 DIAGNOSIS — N201 Calculus of ureter: Secondary | ICD-10-CM | POA: Diagnosis not present

## 2019-04-05 ENCOUNTER — Ambulatory Visit (INDEPENDENT_AMBULATORY_CARE_PROVIDER_SITE_OTHER): Payer: Medicare Other | Admitting: Sports Medicine

## 2019-04-05 ENCOUNTER — Other Ambulatory Visit: Payer: Self-pay

## 2019-04-05 ENCOUNTER — Encounter: Payer: Self-pay | Admitting: Sports Medicine

## 2019-04-05 DIAGNOSIS — I739 Peripheral vascular disease, unspecified: Secondary | ICD-10-CM | POA: Diagnosis not present

## 2019-04-05 DIAGNOSIS — Z7901 Long term (current) use of anticoagulants: Secondary | ICD-10-CM

## 2019-04-05 DIAGNOSIS — M79674 Pain in right toe(s): Secondary | ICD-10-CM

## 2019-04-05 DIAGNOSIS — M79675 Pain in left toe(s): Secondary | ICD-10-CM

## 2019-04-05 DIAGNOSIS — B351 Tinea unguium: Secondary | ICD-10-CM

## 2019-04-05 DIAGNOSIS — Z872 Personal history of diseases of the skin and subcutaneous tissue: Secondary | ICD-10-CM

## 2019-04-05 NOTE — Progress Notes (Signed)
Subjective: Spencer Cochran is a 74 y.o. male patient seen today in office with complaint of mildly painful thickened and elongated toenails; unable to trim. Patient is still on Eliquis blood thinner for history of Cardiac/ A Fib like before with no changes in this medication.  Reports that he has went for a check of his liver and gallbladder and is awaiting the results from the scan to see if he needs a gallbladder procedure and reports that he has been losing weight and this may be because according to his primary care doctor.  No other pedal complaints at this time.  FBS 106 yesterday diet controlled A1c 5.8 PCP Dr. Nyra Capes last week  Patient Active Problem List   Diagnosis Date Noted  . Gait abnormality 08/03/2018  . Paresthesia 07/18/2018  . Coronary artery disease 10/01/2016  . Dyslipidemia 10/01/2016  . Atrial fibrillation ([place]) 03/08/2014  . Benign prostatic hypertrophy with incomplete bladder emptying 04/20/2013    Current Outpatient Medications on File Prior to Visit  Medication Sig Dispense Refill  . acetaminophen (TYLENOL) 650 MG CR tablet Take 1,300 mg by mouth daily as needed for pain.    Marland Kitchen amLODipine-benazepril (LOTREL) 5-40 MG capsule TAKE 1 CAPSULE DAILY (Patient taking differently: Take 1 capsule by mouth daily. ) 90 capsule 2  . atorvastatin (LIPITOR) 40 MG tablet Take 40 mg by mouth at bedtime.     . carvedilol (COREG) 25 MG tablet Take 1 tablet (25 mg total) by mouth 2 (two) times daily with a meal. 180 tablet 1  . ferrous sulfate 325 (65 FE) MG tablet Take 325 mg by mouth daily.    . furosemide (LASIX) 40 MG tablet TAKE 1 TABLET EVERY OTHER DAY 45 tablet 4  . nitroGLYCERIN (NITROSTAT) 0.4 MG SL tablet Place 0.4 mg under the tongue every 5 (five) minutes x 3 doses as needed for chest pain.     . Omega-3 Fatty Acids (OMEGA 3 PO) Take 520 mg by mouth daily.    Marland Kitchen omeprazole (PRILOSEC) 20 MG capsule Take 20 mg by mouth daily before breakfast.     . timolol (TIMOPTIC) 0.5 %  ophthalmic solution Place 1 drop into both eyes 2 (two) times daily.     Marland Kitchen triamcinolone cream (KENALOG) 0.1 % Apply 1 application topically daily as needed (skin irritation).      No current facility-administered medications on file prior to visit.    Allergies  Allergen Reactions  . Hydrocodone Other (See Comments)    insomnia "awake for 48 hours"    Objective: Physical Exam  General: Well developed, nourished, no acute distress, awake, alert and oriented x 3  Vascular: Dorsalis pedis artery 1/4 bilateral, Posterior tibial artery 0/4 bilateral due to trace edema at ankles bilateral, skin temperature warm to warm proximal to distal bilateral lower extremities, mild varicosities, scant pedal hair present bilateral.  Neurological: Gross sensation present via light touch bilateral.   Dermatological: Skin is warm, dry, and supple bilateral, Nails 1-10 are tender, long, thick, and discolored with mild subungal debris,  there is mild incurvation at medial and lateral hallux nail margins without any acute signs of infection, no webspace macerations present bilateral, no open lesions present bilateral, no callus/corns/hyperkeratotic tissue present bilateral. No signs of infection bilateral.  Musculoskeletal: Pes planus deformities noted bilateral. Muscular strength within normal limits without painon range of motion. No pain with calf compression bilateral.  Assessment and Plan:  Problem List Items Addressed This Visit    None    Visit  Diagnoses    Pain due to onychomycosis of toenails of both feet    -  Primary   History of ingrowing nail       Long term current use of anticoagulant therapy       PVD (peripheral vascular disease) ([place])         -Examined patient.  -Re-Discussed treatment options for painful mycotic nails. -Mechanically debrided and reduced mycotic nails and removed all offending nail borders with sterile nail nipper and dremel nail file without incident, to patient's  tolerance. -Patient to return in 3 months for follow up nail care or sooner if symptoms worsen.  Landis Martins, DPM

## 2019-04-06 ENCOUNTER — Telehealth: Payer: Self-pay | Admitting: Gastroenterology

## 2019-04-06 NOTE — Telephone Encounter (Signed)
Hi Dr. Lyndel Safe,  Dr. Nyra Capes office called stating this patient has choledocholithiasis w\obstruction and is requesting to see you ASAP. Patient records are in Epic for review please advise on scheduling.      Thank you

## 2019-04-07 ENCOUNTER — Encounter: Payer: Self-pay | Admitting: Gastroenterology

## 2019-04-07 NOTE — Telephone Encounter (Signed)
Please work him into my next clinic. I only see labs Please get any CT or MRI performed from Dr. Nyra Capes Thx  [place]

## 2019-04-07 NOTE — Telephone Encounter (Signed)
Notes received regarding last ov and [place] that was done. Called [facility] and requested the last CT from 01/18/2019 and if there is any other radiology report. Left voicemail with her since she is in 2 times a week

## 2019-04-07 NOTE — Telephone Encounter (Signed)
Patient has already been scheduled for an OV with Dr. Garrel Ridgel on 04/18/2019 at 2:10 pm;  Please obtain the requested records for Dr. Lyndel Safe to review;

## 2019-04-07 NOTE — Telephone Encounter (Signed)
Please work him into my next clinic. I only see labs Please get any CT or MRI performed from Dr. Nyra Capes Thx   [place]

## 2019-04-10 ENCOUNTER — Ambulatory Visit (INDEPENDENT_AMBULATORY_CARE_PROVIDER_SITE_OTHER): Payer: Medicare Other | Admitting: Gastroenterology

## 2019-04-10 ENCOUNTER — Other Ambulatory Visit: Payer: Self-pay

## 2019-04-10 ENCOUNTER — Telehealth: Payer: Self-pay

## 2019-04-10 VITALS — BP 108/66 | HR 98 | Temp 98.2°F | Ht 69.0 in | Wt 174.2 lb

## 2019-04-10 DIAGNOSIS — K8050 Calculus of bile duct without cholangitis or cholecystitis without obstruction: Secondary | ICD-10-CM | POA: Diagnosis not present

## 2019-04-10 DIAGNOSIS — K8020 Calculus of gallbladder without cholecystitis without obstruction: Secondary | ICD-10-CM | POA: Diagnosis not present

## 2019-04-10 DIAGNOSIS — K831 Obstruction of bile duct: Secondary | ICD-10-CM | POA: Diagnosis not present

## 2019-04-10 DIAGNOSIS — R634 Abnormal weight loss: Secondary | ICD-10-CM | POA: Diagnosis not present

## 2019-04-10 MED ORDER — FLUTICASONE PROPIONATE 0.05 % EX CREA: TOPICAL_CREAM | Freq: Two times a day (BID) | CUTANEOUS | 2 refills | Status: AC

## 2019-04-10 NOTE — H&P (View-Only) (Signed)
Chief Complaint:   Referring Provider:  Maryella Shivers, MD      ASSESSMENT AND PLAN;   #1.  Painless Obs jaundice with abn LFTs. US-dilated CBD with suspected choledocholithiasis, cholelithiasis. R/O other causes.  #2.  Wt loss  #3.  Comorbid conditions include A Fib on eliquis (followed by Dr Raliegh Ip), CAD, OSA, HTN, pre-DM.  Plan: -MRCP ASAP. -CBC, CMP, lipase and PT INR. -Therafter ERCP after cardio clearence, hold eliquis 24hrs prior.  I discussed risks and benefits in detail including small but definite risks of pancreatitis, bleeding, perforation.  I also discussed that he may require laparoscopic cholecystectomy thereafter. -For hemorrhoids: fluticasone cream 0.05% generic 30g 1 bid PR x 10 days, 2 refills    HPI:    Spencer Cochran is a 74 y.o. male  Who has not been feeling very well ever since left incarcerated inguinal hernia repair January 30, 2019 (Dr Lilia Pro), has decreased appetite with weight from 233lb to 174lb today LFTs showed TB 2.9, alk phos 336, AST 91 which prompted ultrasound  [place] 04/01/2019: Distended gallbladder with multiple stones but no acute cholecystitis.  Dilated CBD 1.1 cm with intrahepatic biliary ductal dilatation.  Suspicious for choledocholithiasis.  Heterogeneous appearance of the liver consistent with fatty liver.  It was compared with CT 01/18/2019.  Recommended MRCP/ERCP  Has history of itching and easy bruisability but on Eliquis.  No intake of over-the-counter medications including diet pills, herbal medications, anabolic steroids or Tylenol.  There is no history of blood transfusions, IV drug use or family history of liver disease. No history of alcohol abuse.  Did have dark urine.  Also lately having problems with hemorrhoids.  Wanted something stronger than Preparation H.  No nausea, vomiting, heartburn, regurgitation, odynophagia or dysphagia.  No significant diarrhea or constipation.  No melena or hematochezia. No abdominal  pain.  No fever or chills.   Past GI procedures: -Colonoscopy 2019: Dr Melina Copa- neg per patient except diverticulosis.  We do not have the report.  Previous colonoscopy 03/2012: Small tubular adenomas s/p polypectomy, pancolonic diverticulosis predominantly in the left colon, small internal and external hemorrhoids.  Past Medical History:  Diagnosis Date  . Arthritis knees and ankle  . Asymptomatic gallstones   . Cancer ([place])    skin   . Cataract immature BILATERAL EYES  . Coronary artery disease CARDIOLOGIST-  DR Agustin Cree  [facility] Alert)---  LAST VISIT 04-29-2011   DENIES CARDIAC SYMPTOMS  . DDD (degenerative disc disease), lumbar   . Dysrhythmia    A Fib   . Frequency of urination   . GERD (gastroesophageal reflux disease)   . Glaucoma    both eyes  . History of colon polyps   . History of kidney stones   . Hypertension   . 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 Left ventricular diastolic dysfunction PER ECHO 03-31-2011  W/ CHART  . Nocturia   . OSA on CPAP    wears cpap  . Pre-diabetes    last hemaglobin a 1 c was 5.6  . Unsteadiness   . Urethral stricture     Past Surgical History:  Procedure Laterality Date  . APPENDECTOMY  1972  . CARDIOVASCULAR STRESS TEST  04-29-2010   DR KRASAWSKI   NO EVIDENCE ISCHEMIA/ NORMAL LVSF AND WALL MOTION/ EF 63%  . CERVICAL FUSION  2006   C3 - 6  . COLONOSCOPY  03/23/2012   Small colonic polyps, status post polypectomy. Pancolonic diverticulosis  predominantly in the left colon.Small internal and external hemorrhoids  . COLONOSCOPY  2019   Dr Orlena Sheldon. Diverticulosis. Doesn't think he removed polpys   . CYSTO/ URETHRAL DILATION/ TRANSURETHRAL INCISIONOF PROSTATE  01-13-2007  . CYSTOSCOPY  2005  . CYSTOSCOPY W/ RETROGRADES  06/12/2011   Procedure: CYSTOSCOPY WITH RETROGRADE PYELOGRAM;  Surgeon: Ailene Rud, MD;  Location: [facility];  Service: Urology;  Laterality: N/A;  cysto,  urethral dilation, right retrograde pyelogram    . CYSTOSCOPY WITH RETROGRADE PYELOGRAM, URETEROSCOPY AND STENT PLACEMENT Left 03/08/2014   Procedure: CYSTOSCOPY WITH LEFT RETROGRADE PYELOGRAM/LEFT  URETEROSCOPY;  Surgeon: Ailene Rud, MD;  Location: [facility];  Service: Urology;  Laterality: Left;  . CYSTOSCOPY WITH URETHRAL DILATATION N/A 04/20/2013   Procedure: CYSTOSCOPY WITH URETHRAL DILATATION;  Surgeon: Ailene Rud, MD;  Location: [facility];  Service: Urology;  Laterality: N/A;  . CYSTOSCOPY/URETEROSCOPY/HOLMIUM LASER/STENT PLACEMENT Left 06/10/2017   Procedure: CYSTOSCOPY/RETROGRADE/URETEROSCOPY/HOLMIUM LASER/STENT PLACEMENT;  Surgeon: Raynelle Bring, MD;  Location: [facility];  Service: Urology;  Laterality: Left;  . CYSTOSCOPY/URETEROSCOPY/HOLMIUM LASER/STENT PLACEMENT Right 03/09/2019   Procedure: CYSTOSCOPY/RETROGRADE/URETEROSCOPY/HOLMIUM LASER/STENT PLACEMENT;  Surgeon: Raynelle Bring, MD;  Location: [facility];  Service: Urology;  Laterality: Right;  ONLY NEEDS 60 MIN  . EXTRACORPOREAL SHOCK WAVE LITHOTRIPSY  01-17-2007   LEFT  . HERNIA REPAIR  01/30/2019   [facility]  . HOLMIUM LASER APPLICATION Left 04/08/4825   Procedure: LASER OF LEFT RENAL PELVIC STONE;  Surgeon: Ailene Rud, MD;  Location: [facility];  Service: Urology;  Laterality: Left;  . INGUINAL HERNIA REPAIR  2003  . JOINT REPLACEMENT     total knee right 03-01-17  . LEFT ACHILLES TENDON REPAIR  1992  . MASS EXCISION N/A 11/14/2018   Procedure: EXCISIONAL BIOPSY OF GLANS PENIS;  Surgeon: Raynelle Bring, MD;  Location: [facility];  Service: Urology;  Laterality: N/A;  ONLY NEEDS 60 MIN  . NASAL SINUS SURGERY  2012  . PENILE SURGERY  OF MEATUS  1955  . TRANSTHORACIC ECHOCARDIOGRAM  03-31-2011   NORMAL LVEF  59%/ TRIVIAL MR/ DIASTOLIC DYSFUNCTION/ MODERATE LVH  . TRANSURETHRAL RESECTION OF PROSTATE N/A 04/20/2013   Procedure: TRANSURETHRAL RESECTION OF THE PROSTATE WITH GYRUS INSTRUMENTS;  Surgeon:  Ailene Rud, MD;  Location: [facility];  Service: Urology;  Laterality: N/A;    Family History  Problem Relation Age of Onset  . Breast cancer Mother   . Cirrhosis Father   . Colon cancer Maternal Aunt        died in 51's   . Esophageal cancer Neg Hx     Social History   Tobacco Use  . Smoking status: Never Smoker  . Smokeless tobacco: Never Used  Substance Use Topics  . Alcohol use: No  . Drug use: No    Current Outpatient Medications  Medication Sig Dispense Refill  . acetaminophen (TYLENOL) 650 MG CR tablet Take 1,300 mg by mouth daily as needed for pain.    Marland Kitchen amLODipine-benazepril (LOTREL) 5-40 MG capsule TAKE 1 CAPSULE DAILY (Patient taking differently: Take 1 capsule by mouth daily. ) 90 capsule 2  . apixaban (ELIQUIS) 5 MG TABS tablet Take 5 mg by mouth 2 (two) times daily.    Marland Kitchen atorvastatin (LIPITOR) 40 MG tablet Take 40 mg by mouth at bedtime.     . carvedilol (COREG) 25 MG tablet Take 1 tablet (25 mg total) by mouth 2 (two) times daily with a meal. 180 tablet 1  . ferrous sulfate 325 (  65 FE) MG tablet Take 325 mg by mouth daily.    . furosemide (LASIX) 40 MG tablet TAKE 1 TABLET EVERY OTHER DAY 45 tablet 4  . Omega-3 Fatty Acids (OMEGA 3 PO) Take 520 mg by mouth daily.    Marland Kitchen omeprazole (PRILOSEC) 20 MG capsule Take 20 mg by mouth daily before breakfast.     . timolol (TIMOPTIC) 0.5 % ophthalmic solution Place 1 drop into both eyes 2 (two) times daily.     Marland Kitchen triamcinolone cream (KENALOG) 0.1 % Apply 1 application topically daily as needed (skin irritation).     . nitroGLYCERIN (NITROSTAT) 0.4 MG SL tablet Place 0.4 mg under the tongue every 5 (five) minutes x 3 doses as needed for chest pain.      No current facility-administered medications for this visit.    Allergies  Allergen Reactions  . Hydrocodone Other (See Comments)    insomnia "awake for 48 hours"    Review of Systems:  Constitutional: Denies fever, chills, diaphoresis, appetite change and  fatigue.  HEENT: Denies photophobia, eye pain, redness, hearing loss, ear pain, congestion, sore throat, rhinorrhea, sneezing, mouth sores, neck pain, neck stiffness and tinnitus.   Respiratory: Denies SOB, DOE, cough, chest tightness,  and wheezing.   Cardiovascular: Denies chest pain, palpitations and leg swelling.  Genitourinary: Denies dysuria, urgency, frequency, hematuria, flank pain and difficulty urinating.  Musculoskeletal: Denies myalgias, back pain, joint swelling, arthralgias and gait problem.  Skin: No rash.  Neurological: Denies dizziness, seizures, syncope, weakness, light-headedness, numbness and headaches.  Hematological: Denies adenopathy. Easy bruising, personal or family bleeding history  Psychiatric/Behavioral: No anxiety or depression     Physical Exam:    BP 108/66   Pulse 98   Temp 98.2 F (36.8 C)   Ht 5' 9"  (1.753 m)   Wt 174 lb 4 oz (79 kg)   BMI 25.73 kg/m  Wt Readings from Last 3 Encounters:  04/10/19 174 lb 4 oz (79 kg)  03/16/19 173 lb (78.5 kg)  03/09/19 171 lb 9.6 oz (77.8 kg)   Constitutional:  Well-developed, in no acute distress. Psychiatric: Normal mood and affect. Behavior is normal. HEENT: Pupils normal.  Conjunctivae are normal.  Mild jaundice. Neck supple.  Cardiovascular: Normal rate, regular rhythm. No edema Pulmonary/chest: Effort normal and breath sounds normal. No wheezing, rales or rhonchi. Abdominal: Soft, nondistended. Nontender. Bowel sounds active throughout. There are no masses palpable. No hepatomegaly. Rectal:  defered Neurological: Alert and oriented to person place and time. Skin: Skin is warm and dry. No rashes noted.  Data Reviewed: I have personally reviewed following labs and imaging studies  CBC: CBC Latest Ref Rng & Units 03/16/2019 03/09/2019 11/07/2018  WBC 3.4 - 10.8 x10E3/uL 6.9 - 9.5  Hemoglobin 13.0 - 17.7 g/dL 12.4(L) 14.3 12.4(L)  Hematocrit 37.5 - 51.0 % 36.9(L) 42.0 38.7(L)  Platelets 150 - 450 x10E3/uL  253 - 297    CMP: CMP Latest Ref Rng & Units 03/16/2019 03/09/2019 11/07/2018  Glucose 65 - 99 mg/dL 115(H) 106(H) 110(H)  BUN 8 - 27 mg/dL 16 16 23   Creatinine 0.76 - 1.27 mg/dL 0.75(L) 0.70 0.93  Sodium 134 - 144 mmol/L 141 145 141  Potassium 3.5 - 5.2 mmol/L 4.0 3.2(L) 4.0  Chloride 96 - 106 mmol/L 103 106 108  CO2 20 - 29 mmol/L 29 - 27  Calcium 8.6 - 10.2 mg/dL 8.7 - 9.1  Total Protein 6.0 - 8.5 g/dL 6.1 - -  Total Bilirubin 0.0 - 1.2 mg/dL 2.9(H) - -  Alkaline Phos 39 - 117 IU/L 836(H) - -  AST 0 - 40 IU/L 91(H) - -  ALT 0 - 44 IU/L 61(H) - -   Hepatic Function Latest Ref Rng & Units 03/16/2019 07/18/2018  Total Protein 6.0 - 8.5 g/dL 6.1 6.6  Albumin 3.7 - 4.7 g/dL 2.9(L) 3.9  AST 0 - 40 IU/L 91(H) 27  ALT 0 - 44 IU/L 61(H) 13  Alk Phosphatase 39 - 117 IU/L 836(H) 70  Total Bilirubin 0.0 - 1.2 mg/dL 2.9(H) 0.6      Carmell Austria, MD 04/10/2019, 3:17 PM  Cc: Maryella Shivers, MD

## 2019-04-10 NOTE — Patient Instructions (Addendum)
If you are age 75 or older, your body mass index should be between 23-30. Your Body mass index is 25.73 kg/m. If this is out of the aforementioned range listed, please consider follow up with your Primary Care Provider.  If you are age 27 or younger, your body mass index should be between 19-25. Your Body mass index is 25.73 kg/m. If this is out of the aformentioned range listed, please consider follow up with your Primary Care Provider.   You have been scheduled for an MRI at [facility] on 04/11/19. Your appointment time is 8am. Please arrive 15 minutes prior to your appointment time for registration purposes. Please make certain not to have anything to eat or drink 6 hours prior to your test. In addition, if you have any metal in your body, have a pacemaker or defibrillator, please be sure to let your ordering physician know. This test typically takes 45 minutes to 1 hour to complete. Should you need to reschedule, please call [phone] to do so.   Please go to the lab at [facility] ([place] You will need to go to level "B", you do not need an appointment for this. Hours available are 7:30 am - 4:30 pm.   Thank you,  Dr. Jackquline Denmark

## 2019-04-10 NOTE — Progress Notes (Signed)
Chief Complaint:   Referring Provider:  Maryella Shivers, MD      ASSESSMENT AND PLAN;   #1.  Painless Obs jaundice with abn LFTs. US-dilated CBD with suspected choledocholithiasis, cholelithiasis. R/O other causes.  #2.  Wt loss  #3.  Comorbid conditions include A Fib on eliquis (followed by Dr Raliegh Ip), CAD, OSA, HTN, pre-DM.  Plan: -MRCP ASAP. -CBC, CMP, lipase and PT INR. -Therafter ERCP after cardio clearence, hold eliquis 24hrs prior.  I discussed risks and benefits in detail including small but definite risks of pancreatitis, bleeding, perforation.  I also discussed that he may require laparoscopic cholecystectomy thereafter. -For hemorrhoids: fluticasone cream 0.05% generic 30g 1 bid PR x 10 days, 2 refills    HPI:    Spencer Cochran is a 74 y.o. male  Who has not been feeling very well ever since left incarcerated inguinal hernia repair January 30, 2019 (Dr Lilia Pro), has decreased appetite with weight from 233lb to 174lb today LFTs showed TB 2.9, alk phos 336, AST 91 which prompted ultrasound  [place] 04/01/2019: Distended gallbladder with multiple stones but no acute cholecystitis.  Dilated CBD 1.1 cm with intrahepatic biliary ductal dilatation.  Suspicious for choledocholithiasis.  Heterogeneous appearance of the liver consistent with fatty liver.  It was compared with CT 01/18/2019.  Recommended MRCP/ERCP  Has history of itching and easy bruisability but on Eliquis.  No intake of over-the-counter medications including diet pills, herbal medications, anabolic steroids or Tylenol.  There is no history of blood transfusions, IV drug use or family history of liver disease. No history of alcohol abuse.  Did have dark urine.  Also lately having problems with hemorrhoids.  Wanted something stronger than Preparation H.  No nausea, vomiting, heartburn, regurgitation, odynophagia or dysphagia.  No significant diarrhea or constipation.  No melena or hematochezia. No abdominal  pain.  No fever or chills.   Past GI procedures: -Colonoscopy 2019: Dr Melina Copa- neg per patient except diverticulosis.  We do not have the report.  Previous colonoscopy 03/2012: Small tubular adenomas s/p polypectomy, pancolonic diverticulosis predominantly in the left colon, small internal and external hemorrhoids.  Past Medical History:  Diagnosis Date  . Arthritis knees and ankle  . Asymptomatic gallstones   . Cancer ([place])    skin   . Cataract immature BILATERAL EYES  . Coronary artery disease CARDIOLOGIST-  DR Agustin Cree  Tia Alert)---  LAST VISIT 04-29-2011   DENIES CARDIAC SYMPTOMS  . DDD (degenerative disc disease), lumbar   . Dysrhythmia    A Fib   . Frequency of urination   . GERD (gastroesophageal reflux disease)   . Glaucoma    both eyes  . History of colon polyps   . History of kidney stones   . Hypertension   . 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 Left ventricular diastolic dysfunction PER ECHO 03-31-2011  W/ CHART  . Nocturia   . OSA on CPAP    wears cpap  . Pre-diabetes    last hemaglobin a 1 c was 5.6  . Unsteadiness   . Urethral stricture     Past Surgical History:  Procedure Laterality Date  . APPENDECTOMY  1972  . CARDIOVASCULAR STRESS TEST  04-29-2010   DR KRASAWSKI   NO EVIDENCE ISCHEMIA/ NORMAL LVSF AND WALL MOTION/ EF 63%  . CERVICAL FUSION  2006   C3 - 6  . COLONOSCOPY  03/23/2012   Small colonic polyps, status post polypectomy. Pancolonic diverticulosis  predominantly in the left colon.Small internal and external hemorrhoids  . COLONOSCOPY  2019   Dr Orlena Sheldon. Diverticulosis. Doesn't think he removed polpys   . CYSTO/ URETHRAL DILATION/ TRANSURETHRAL INCISIONOF PROSTATE  01-13-2007  . CYSTOSCOPY  2005  . CYSTOSCOPY W/ RETROGRADES  06/12/2011   Procedure: CYSTOSCOPY WITH RETROGRADE PYELOGRAM;  Surgeon: Ailene Rud, MD;  Location[facility];  Service: Urology;  Laterality: N/A;  cysto,  urethral dilation, right retrograde pyelogram    . CYSTOSCOPY WITH RETROGRADE PYELOGRAM, URETEROSCOPY AND STENT PLACEMENT Left 03/08/2014   Procedure: CYSTOSCOPY WITH LEFT RETROGRADE PYELOGRAM/LEFT  URETEROSCOPY;  Surgeon: Ailene Rud, MD;  Location[facility];  Service: Urology;  Laterality: Left;  . CYSTOSCOPY WITH URETHRAL DILATATION N/A 04/20/2013   Procedure: CYSTOSCOPY WITH URETHRAL DILATATION;  Surgeon: Ailene Rud, MD;  Location[facility];  Service: Urology;  Laterality: N/A;  . CYSTOSCOPY/URETEROSCOPY/HOLMIUM LASER/STENT PLACEMENT Left 06/10/2017   Procedure: CYSTOSCOPY/RETROGRADE/URETEROSCOPY/HOLMIUM LASER/STENT PLACEMENT;  Surgeon: Raynelle Bring, MD;  Location[facility];  Service: Urology;  Laterality: Left;  . CYSTOSCOPY/URETEROSCOPY/HOLMIUM LASER/STENT PLACEMENT Right 03/09/2019   Procedure: CYSTOSCOPY/RETROGRADE/URETEROSCOPY/HOLMIUM LASER/STENT PLACEMENT;  Surgeon: Raynelle Bring, MD;  Location: [facility];  Service: Urology;  Laterality: Right;  ONLY NEEDS 60 MIN  . EXTRACORPOREAL SHOCK WAVE LITHOTRIPSY  01-17-2007   LEFT  . HERNIA REPAIR  01/30/2019   [facility]  . HOLMIUM LASER APPLICATION Left 04/10/9371   Procedure: LASER OF LEFT RENAL PELVIC STONE;  Surgeon: Ailene Rud, MD;  Location: [facility];  Service: Urology;  Laterality: Left;  . INGUINAL HERNIA REPAIR  2003  . JOINT REPLACEMENT     total knee right 03-01-17  . LEFT ACHILLES TENDON REPAIR  1992  . MASS EXCISION N/A 11/14/2018   Procedure: EXCISIONAL BIOPSY OF GLANS PENIS;  Surgeon: Raynelle Bring, MD;  Location: [facility];  Service: Urology;  Laterality: N/A;  ONLY NEEDS 60 MIN  . NASAL SINUS SURGERY  2012  . PENILE SURGERY  OF MEATUS  1955  . TRANSTHORACIC ECHOCARDIOGRAM  03-31-2011   NORMAL LVEF  59%/ TRIVIAL MR/ DIASTOLIC DYSFUNCTION/ MODERATE LVH  . TRANSURETHRAL RESECTION OF PROSTATE N/A 04/20/2013   Procedure: TRANSURETHRAL RESECTION OF THE PROSTATE WITH GYRUS INSTRUMENTS;  Surgeon:  Ailene Rud, MD;  Location: [facility];  Service: Urology;  Laterality: N/A;    Family History  Problem Relation Age of Onset  . Breast cancer Mother   . Cirrhosis Father   . Colon cancer Maternal Aunt        died in 37's   . Esophageal cancer Neg Hx     Social History   Tobacco Use  . Smoking status: Never Smoker  . Smokeless tobacco: Never Used  Substance Use Topics  . Alcohol use: No  . Drug use: No    Current Outpatient Medications  Medication Sig Dispense Refill  . acetaminophen (TYLENOL) 650 MG CR tablet Take 1,300 mg by mouth daily as needed for pain.    Marland Kitchen amLODipine-benazepril (LOTREL) 5-40 MG capsule TAKE 1 CAPSULE DAILY (Patient taking differently: Take 1 capsule by mouth daily. ) 90 capsule 2  . apixaban (ELIQUIS) 5 MG TABS tablet Take 5 mg by mouth 2 (two) times daily.    Marland Kitchen atorvastatin (LIPITOR) 40 MG tablet Take 40 mg by mouth at bedtime.     . carvedilol (COREG) 25 MG tablet Take 1 tablet (25 mg total) by mouth 2 (two) times daily with a meal. 180 tablet 1  . ferrous sulfate 325 (  65 FE) MG tablet Take 325 mg by mouth daily.    . furosemide (LASIX) 40 MG tablet TAKE 1 TABLET EVERY OTHER DAY 45 tablet 4  . Omega-3 Fatty Acids (OMEGA 3 PO) Take 520 mg by mouth daily.    Marland Kitchen omeprazole (PRILOSEC) 20 MG capsule Take 20 mg by mouth daily before breakfast.     . timolol (TIMOPTIC) 0.5 % ophthalmic solution Place 1 drop into both eyes 2 (two) times daily.     Marland Kitchen triamcinolone cream (KENALOG) 0.1 % Apply 1 application topically daily as needed (skin irritation).     . nitroGLYCERIN (NITROSTAT) 0.4 MG SL tablet Place 0.4 mg under the tongue every 5 (five) minutes x 3 doses as needed for chest pain.      No current facility-administered medications for this visit.    Allergies  Allergen Reactions  . Hydrocodone Other (See Comments)    insomnia "awake for 48 hours"    Review of Systems:  Constitutional: Denies fever, chills, diaphoresis, appetite change and  fatigue.  HEENT: Denies photophobia, eye pain, redness, hearing loss, ear pain, congestion, sore throat, rhinorrhea, sneezing, mouth sores, neck pain, neck stiffness and tinnitus.   Respiratory: Denies SOB, DOE, cough, chest tightness,  and wheezing.   Cardiovascular: Denies chest pain, palpitations and leg swelling.  Genitourinary: Denies dysuria, urgency, frequency, hematuria, flank pain and difficulty urinating.  Musculoskeletal: Denies myalgias, back pain, joint swelling, arthralgias and gait problem.  Skin: No rash.  Neurological: Denies dizziness, seizures, syncope, weakness, light-headedness, numbness and headaches.  Hematological: Denies adenopathy. Easy bruising, personal or family bleeding history  Psychiatric/Behavioral: No anxiety or depression     Physical Exam:    BP 108/66   Pulse 98   Temp 98.2 F (36.8 C)   Ht 5' 9"  (1.753 m)   Wt 174 lb 4 oz (79 kg)   BMI 25.73 kg/m  Wt Readings from Last 3 Encounters:  04/10/19 174 lb 4 oz (79 kg)  03/16/19 173 lb (78.5 kg)  03/09/19 171 lb 9.6 oz (77.8 kg)   Constitutional:  Well-developed, in no acute distress. Psychiatric: Normal mood and affect. Behavior is normal. HEENT: Pupils normal.  Conjunctivae are normal.  Mild jaundice. Neck supple.  Cardiovascular: Normal rate, regular rhythm. No edema Pulmonary/chest: Effort normal and breath sounds normal. No wheezing, rales or rhonchi. Abdominal: Soft, nondistended. Nontender. Bowel sounds active throughout. There are no masses palpable. No hepatomegaly. Rectal:  defered Neurological: Alert and oriented to person place and time. Skin: Skin is warm and dry. No rashes noted.  Data Reviewed: I have personally reviewed following labs and imaging studies  CBC: CBC Latest Ref Rng & Units 03/16/2019 03/09/2019 11/07/2018  WBC 3.4 - 10.8 x10E3/uL 6.9 - 9.5  Hemoglobin 13.0 - 17.7 g/dL 12.4(L) 14.3 12.4(L)  Hematocrit 37.5 - 51.0 % 36.9(L) 42.0 38.7(L)  Platelets 150 - 450 x10E3/uL  253 - 297    CMP: CMP Latest Ref Rng & Units 03/16/2019 03/09/2019 11/07/2018  Glucose 65 - 99 mg/dL 115(H) 106(H) 110(H)  BUN 8 - 27 mg/dL 16 16 23   Creatinine 0.76 - 1.27 mg/dL 0.75(L) 0.70 0.93  Sodium 134 - 144 mmol/L 141 145 141  Potassium 3.5 - 5.2 mmol/L 4.0 3.2(L) 4.0  Chloride 96 - 106 mmol/L 103 106 108  CO2 20 - 29 mmol/L 29 - 27  Calcium 8.6 - 10.2 mg/dL 8.7 - 9.1  Total Protein 6.0 - 8.5 g/dL 6.1 - -  Total Bilirubin 0.0 - 1.2 mg/dL 2.9(H) - -  Alkaline Phos 39 - 117 IU/L 836(H) - -  AST 0 - 40 IU/L 91(H) - -  ALT 0 - 44 IU/L 61(H) - -   Hepatic Function Latest Ref Rng & Units 03/16/2019 07/18/2018  Total Protein 6.0 - 8.5 g/dL 6.1 6.6  Albumin 3.7 - 4.7 g/dL 2.9(L) 3.9  AST 0 - 40 IU/L 91(H) 27  ALT 0 - 44 IU/L 61(H) 13  Alk Phosphatase 39 - 117 IU/L 836(H) 70  Total Bilirubin 0.0 - 1.2 mg/dL 2.9(H) 0.6      Carmell Austria, MD 04/10/2019, 3:17 PM  Cc: Maryella Shivers, MD

## 2019-04-11 ENCOUNTER — Other Ambulatory Visit: Payer: Self-pay | Admitting: Gastroenterology

## 2019-04-11 ENCOUNTER — Telehealth: Payer: Self-pay

## 2019-04-11 ENCOUNTER — Ambulatory Visit: Payer: Medicare Other | Admitting: Gastroenterology

## 2019-04-11 ENCOUNTER — Other Ambulatory Visit: Payer: Self-pay

## 2019-04-11 ENCOUNTER — Other Ambulatory Visit (HOSPITAL_COMMUNITY)
Admission: RE | Admit: 2019-04-11 | Discharge: 2019-04-11 | Disposition: A | Discharge status: Home / Self Care | Payer: Medicare Other | Source: Ambulatory Visit | Attending: Gastroenterology | Admitting: Gastroenterology

## 2019-04-11 ENCOUNTER — Other Ambulatory Visit (INDEPENDENT_AMBULATORY_CARE_PROVIDER_SITE_OTHER): Payer: Medicare Other

## 2019-04-11 ENCOUNTER — Ambulatory Visit (HOSPITAL_COMMUNITY)
Admission: RE | Admit: 2019-04-11 | Discharge: 2019-04-11 | Disposition: A | Discharge status: Home / Self Care | Payer: Medicare Other | Source: Ambulatory Visit | Attending: Gastroenterology | Admitting: Gastroenterology

## 2019-04-11 DIAGNOSIS — K8020 Calculus of gallbladder without cholecystitis without obstruction: Secondary | ICD-10-CM

## 2019-04-11 DIAGNOSIS — K831 Obstruction of bile duct: Secondary | ICD-10-CM

## 2019-04-11 DIAGNOSIS — U071 COVID-19: Secondary | ICD-10-CM | POA: Insufficient documentation

## 2019-04-11 DIAGNOSIS — R634 Abnormal weight loss: Secondary | ICD-10-CM

## 2019-04-11 DIAGNOSIS — K8071 Calculus of gallbladder and bile duct without cholecystitis with obstruction: Secondary | ICD-10-CM | POA: Insufficient documentation

## 2019-04-11 DIAGNOSIS — K8050 Calculus of bile duct without cholangitis or cholecystitis without obstruction: Secondary | ICD-10-CM

## 2019-04-11 LAB — LIPASE: Lipase: 23 U/L (ref 11.0–59.0)

## 2019-04-11 LAB — CBC WITH DIFFERENTIAL/PLATELET
Basophils Absolute: 0 10*3/uL (ref 0.0–0.1)
Basophils Relative: 0.4 % (ref 0.0–3.0)
Eosinophils Absolute: 0 10*3/uL (ref 0.0–0.7)
Eosinophils Relative: 0.5 % (ref 0.0–5.0)
HCT: 37.9 % — ABNORMAL LOW (ref 39.0–52.0)
Hemoglobin: 12.9 g/dL — ABNORMAL LOW (ref 13.0–17.0)
Lymphocytes Relative: 55.1 % — ABNORMAL HIGH (ref 12.0–46.0)
Lymphs Abs: 3 10*3/uL (ref 0.7–4.0)
MCHC: 33.9 g/dL (ref 30.0–36.0)
MCV: 96.1 fl (ref 78.0–100.0)
Monocytes Absolute: 0.4 10*3/uL (ref 0.1–1.0)
Monocytes Relative: 7.7 % (ref 3.0–12.0)
Neutro Abs: 2 10*3/uL (ref 1.4–7.7)
Neutrophils Relative %: 36.3 % — ABNORMAL LOW (ref 43.0–77.0)
Platelets: 211 10*3/uL (ref 150.0–400.0)
RBC: 3.95 Mil/uL — ABNORMAL LOW (ref 4.22–5.81)
RDW: 14.8 % (ref 11.5–15.5)
WBC: 5.5 10*3/uL (ref 4.0–10.5)

## 2019-04-11 LAB — COMPREHENSIVE METABOLIC PANEL
ALT: 44 U/L (ref 0–53)
AST: 86 U/L — ABNORMAL HIGH (ref 0–37)
Albumin: 2.9 g/dL — ABNORMAL LOW (ref 3.5–5.2)
Alkaline Phosphatase: 798 U/L — ABNORMAL HIGH (ref 39–117)
BUN: 15 mg/dL (ref 6–23)
CO2: 32 mEq/L (ref 19–32)
Calcium: 8.6 mg/dL (ref 8.4–10.5)
Chloride: 104 mEq/L (ref 96–112)
Creatinine, Ser: 0.76 mg/dL (ref 0.40–1.50)
GFR: 100.08 mL/min (ref >60.00)
Glucose, Bld: 99 mg/dL (ref 70–99)
Potassium: 3.7 mEq/L (ref 3.5–5.1)
Sodium: 142 mEq/L (ref 135–145)
Total Bilirubin: 2 mg/dL — ABNORMAL HIGH (ref 0.2–1.2)
Total Protein: 6.6 g/dL (ref 6.0–8.3)

## 2019-04-11 LAB — PROTIME-INR
INR: 1 ratio (ref 0.8–1.0)
Prothrombin Time: 11.7 s (ref 9.6–13.1)

## 2019-04-11 MED ORDER — GADOBUTROL 1 MMOL/ML IV SOLN
7.0000 mL | Freq: Once | INTRAVENOUS | Status: AC | PRN
  Administered 2019-04-11: 8 mL via INTRAVENOUS

## 2019-04-11 MED ORDER — LEVOFLOXACIN IN D5W 750 MG/150ML IV SOLN: 500.0000 mg | Freq: Once | INTRAVENOUS | Status: DC

## 2019-04-11 NOTE — Telephone Encounter (Signed)
Patient with diagnosis of atrial fibrillation on Eliquis for anticoagulation.    Procedure: ERCP Date of procedure: 04/13/19  CHADS2-VASc score of 3 (HTN, AGE, CAD)  CrCl 95.3 ml/min Platelet count 211  Per office protocol, patient can hold Eliquis for 1 day prior to procedure.

## 2019-04-11 NOTE — Telephone Encounter (Signed)
Please review results from MRI abd as Sawyer called report;

## 2019-04-11 NOTE — Progress Notes (Signed)
ERCP @ [facility] tomorrow PM or Thursday PM Hold Eliquis Can give levaquin 500mg  IV x 1 prior  RG

## 2019-04-11 NOTE — Telephone Encounter (Signed)
   Primary Cardiologist: Jenne Campus, MD  Chart reviewed as part of pre-operative protocol coverage. Request is for anticoagulation recommendations only.   Patient with diagnosis of atrial fibrillation on Eliquis for anticoagulation.  Procedure: ERCP  Date of procedure: 04/13/19  CHADS2-VASc score of 3 (HTN, AGE, CAD)  CrCl 95.3 ml/min  Platelet count 211  Per office protocol, patient can hold Eliquis for 1 day prior to procedure.     I will route this recommendation to the requesting party via Epic fax function and remove from pre-op pool.  Please call with questions.  Daune Perch, NP 04/11/2019, 2:24 PM

## 2019-04-11 NOTE — Telephone Encounter (Signed)
I have called and instructed patient, patient voiced understanding.  

## 2019-04-11 NOTE — Telephone Encounter (Signed)
[facility] Pre-operative Risk Assessment     Request for surgical clearance:     Endoscopy Procedure  What type of surgery is being performed?     ERCP  When is this surgery scheduled?     04/13/19  What type of clearance is required ?   Pharmacy  Are there any medications that need to be held prior to surgery and how long? Eliquis x24 hours   Practice name and name of physician performing surgery?      [facility]  What is your office phone and fax number?      Phone- [phone]  [phone]  Anesthesia type (None, local, MAC, general) ?       MAC

## 2019-04-12 ENCOUNTER — Telehealth: Payer: Self-pay | Admitting: Gastroenterology

## 2019-04-12 LAB — SARS CORONAVIRUS 2 (TAT 6-24 HRS): SARS Coronavirus 2: POSITIVE — AB

## 2019-04-12 NOTE — Telephone Encounter (Signed)
Patients procedure time was changed to 1:15pm, patient was called and informed of time change and instructed to enter through the Emergency Department. Patient voiced understanding.

## 2019-04-12 NOTE — Telephone Encounter (Signed)
Please assist with this patient per your request-

## 2019-04-12 NOTE — Progress Notes (Signed)
Patient with positive covid result. Contacted MD and informed of result.   

## 2019-04-13 ENCOUNTER — Encounter (HOSPITAL_COMMUNITY)
Admission: RE | Disposition: A | Discharge status: Home / Self Care | Payer: Self-pay | Source: Home / Self Care | Attending: Gastroenterology

## 2019-04-13 ENCOUNTER — Ambulatory Visit (HOSPITAL_COMMUNITY): Payer: Medicare Other | Admitting: Certified Registered Nurse Anesthetist

## 2019-04-13 ENCOUNTER — Ambulatory Visit (HOSPITAL_COMMUNITY): Payer: Medicare Other

## 2019-04-13 ENCOUNTER — Ambulatory Visit (HOSPITAL_COMMUNITY)
Admission: RE | Admit: 2019-04-13 | Discharge: 2019-04-13 | Disposition: A | Discharge status: Home / Self Care | Payer: Medicare Other | Attending: Gastroenterology | Admitting: Gastroenterology

## 2019-04-13 ENCOUNTER — Telehealth: Payer: Self-pay | Admitting: Physician Assistant

## 2019-04-13 ENCOUNTER — Other Ambulatory Visit: Payer: Self-pay

## 2019-04-13 DIAGNOSIS — Z96652 Presence of left artificial knee joint: Secondary | ICD-10-CM | POA: Diagnosis not present

## 2019-04-13 DIAGNOSIS — R509 Fever, unspecified: Secondary | ICD-10-CM | POA: Insufficient documentation

## 2019-04-13 DIAGNOSIS — G4733 Obstructive sleep apnea (adult) (pediatric): Secondary | ICD-10-CM | POA: Diagnosis not present

## 2019-04-13 DIAGNOSIS — K831 Obstruction of bile duct: Secondary | ICD-10-CM

## 2019-04-13 DIAGNOSIS — I2510 Atherosclerotic heart disease of native coronary artery without angina pectoris: Secondary | ICD-10-CM | POA: Insufficient documentation

## 2019-04-13 DIAGNOSIS — Z86010 Personal history of colonic polyps: Secondary | ICD-10-CM | POA: Diagnosis not present

## 2019-04-13 DIAGNOSIS — U071 COVID-19: Secondary | ICD-10-CM | POA: Insufficient documentation

## 2019-04-13 DIAGNOSIS — Z885 Allergy status to narcotic agent status: Secondary | ICD-10-CM | POA: Diagnosis not present

## 2019-04-13 DIAGNOSIS — Z8379 Family history of other diseases of the digestive system: Secondary | ICD-10-CM | POA: Diagnosis not present

## 2019-04-13 DIAGNOSIS — K8050 Calculus of bile duct without cholangitis or cholecystitis without obstruction: Secondary | ICD-10-CM

## 2019-04-13 DIAGNOSIS — H409 Unspecified glaucoma: Secondary | ICD-10-CM | POA: Diagnosis not present

## 2019-04-13 DIAGNOSIS — K219 Gastro-esophageal reflux disease without esophagitis: Secondary | ICD-10-CM | POA: Insufficient documentation

## 2019-04-13 DIAGNOSIS — K8020 Calculus of gallbladder without cholecystitis without obstruction: Secondary | ICD-10-CM

## 2019-04-13 DIAGNOSIS — I712 Thoracic aortic aneurysm, without rupture: Secondary | ICD-10-CM | POA: Insufficient documentation

## 2019-04-13 DIAGNOSIS — M5136 Other intervertebral disc degeneration, lumbar region: Secondary | ICD-10-CM | POA: Diagnosis not present

## 2019-04-13 DIAGNOSIS — I083 Combined rheumatic disorders of mitral, aortic and tricuspid valves: Secondary | ICD-10-CM | POA: Diagnosis not present

## 2019-04-13 DIAGNOSIS — R634 Abnormal weight loss: Secondary | ICD-10-CM

## 2019-04-13 DIAGNOSIS — R3914 Feeling of incomplete bladder emptying: Secondary | ICD-10-CM | POA: Diagnosis not present

## 2019-04-13 DIAGNOSIS — Z803 Family history of malignant neoplasm of breast: Secondary | ICD-10-CM | POA: Insufficient documentation

## 2019-04-13 DIAGNOSIS — Z800 Family history of malignant neoplasm of digestive organs: Secondary | ICD-10-CM | POA: Diagnosis not present

## 2019-04-13 DIAGNOSIS — R0981 Nasal congestion: Secondary | ICD-10-CM | POA: Insufficient documentation

## 2019-04-13 DIAGNOSIS — I10 Essential (primary) hypertension: Secondary | ICD-10-CM | POA: Diagnosis not present

## 2019-04-13 DIAGNOSIS — N401 Enlarged prostate with lower urinary tract symptoms: Secondary | ICD-10-CM | POA: Diagnosis not present

## 2019-04-13 DIAGNOSIS — R2681 Unsteadiness on feet: Secondary | ICD-10-CM | POA: Insufficient documentation

## 2019-04-13 DIAGNOSIS — K838 Other specified diseases of biliary tract: Secondary | ICD-10-CM | POA: Diagnosis not present

## 2019-04-13 DIAGNOSIS — Z79899 Other long term (current) drug therapy: Secondary | ICD-10-CM | POA: Insufficient documentation

## 2019-04-13 DIAGNOSIS — Z87442 Personal history of urinary calculi: Secondary | ICD-10-CM | POA: Diagnosis not present

## 2019-04-13 DIAGNOSIS — I4891 Unspecified atrial fibrillation: Secondary | ICD-10-CM | POA: Insufficient documentation

## 2019-04-13 DIAGNOSIS — M1990 Unspecified osteoarthritis, unspecified site: Secondary | ICD-10-CM | POA: Diagnosis not present

## 2019-04-13 DIAGNOSIS — K8051 Calculus of bile duct without cholangitis or cholecystitis with obstruction: Secondary | ICD-10-CM | POA: Insufficient documentation

## 2019-04-13 DIAGNOSIS — R17 Unspecified jaundice: Secondary | ICD-10-CM | POA: Diagnosis not present

## 2019-04-13 HISTORY — PX: ENDOSCOPIC RETROGRADE CHOLANGIOPANCREATOGRAPHY (ERCP) WITH PROPOFOL: SHX5810

## 2019-04-13 HISTORY — PX: BALLOON DILATION: SHX5330

## 2019-04-13 HISTORY — PX: SPHINCTEROTOMY: SHX5544

## 2019-04-13 HISTORY — PX: REMOVAL OF STONES: SHX5545

## 2019-04-13 SURGERY — ENDOSCOPIC RETROGRADE CHOLANGIOPANCREATOGRAPHY (ERCP) WITH PROPOFOL
Anesthesia: General

## 2019-04-13 MED ORDER — CIPROFLOXACIN IN D5W 400 MG/200ML IV SOLN
INTRAVENOUS | Status: AC
  Filled 2019-04-13: qty 200

## 2019-04-13 MED ORDER — INDOMETHACIN 50 MG RE SUPP
RECTAL | Status: AC
  Filled 2019-04-13: qty 2

## 2019-04-13 MED ORDER — INDOMETHACIN 50 MG RE SUPP
RECTAL | Status: DC | PRN
  Administered 2019-04-13: 50 mg via RECTAL

## 2019-04-13 MED ORDER — DEXAMETHASONE SODIUM PHOSPHATE 10 MG/ML IJ SOLN
INTRAMUSCULAR | Status: DC | PRN
  Administered 2019-04-13: 8 mg via INTRAVENOUS

## 2019-04-13 MED ORDER — SUGAMMADEX SODIUM 200 MG/2ML IV SOLN
INTRAVENOUS | Status: DC | PRN
  Administered 2019-04-13: 200 mg via INTRAVENOUS

## 2019-04-13 MED ORDER — FENTANYL CITRATE (PF) 250 MCG/5ML IJ SOLN
INTRAMUSCULAR | Status: AC
  Filled 2019-04-13: qty 5

## 2019-04-13 MED ORDER — LEVOFLOXACIN 500 MG PO TABS: 500.0000 mg | ORAL_TABLET | Freq: Every day | ORAL | 0 refills | Status: AC

## 2019-04-13 MED ORDER — SUCCINYLCHOLINE CHLORIDE 200 MG/10ML IV SOSY
PREFILLED_SYRINGE | INTRAVENOUS | Status: DC | PRN
  Administered 2019-04-13: 200 mg via INTRAVENOUS

## 2019-04-13 MED ORDER — IOPAMIDOL (ISOVUE-300) INJECTION 61%
INTRAVENOUS | Status: DC | PRN
  Administered 2019-04-13: 60 mL

## 2019-04-13 MED ORDER — PHENYLEPHRINE HCL-NACL 10-0.9 MG/250ML-% IV SOLN
INTRAVENOUS | Status: DC | PRN
  Administered 2019-04-13: 20 ug/min via INTRAVENOUS

## 2019-04-13 MED ORDER — PROPOFOL 10 MG/ML IV BOLUS
INTRAVENOUS | Status: DC | PRN
  Administered 2019-04-13: 150 mg via INTRAVENOUS

## 2019-04-13 MED ORDER — SODIUM CHLORIDE 0.9 % IV SOLN: INTRAVENOUS | Status: DC

## 2019-04-13 MED ORDER — LIDOCAINE 2% (20 MG/ML) 5 ML SYRINGE
INTRAMUSCULAR | Status: DC | PRN
  Administered 2019-04-13: 60 mg via INTRAVENOUS

## 2019-04-13 MED ORDER — MIDAZOLAM HCL 2 MG/2ML IJ SOLN
INTRAMUSCULAR | Status: AC
  Filled 2019-04-13: qty 2

## 2019-04-13 MED ORDER — ROCURONIUM BROMIDE 10 MG/ML (PF) SYRINGE
PREFILLED_SYRINGE | INTRAVENOUS | Status: DC | PRN
  Administered 2019-04-13: 30 mg via INTRAVENOUS
  Administered 2019-04-13 (×2): 10 mg via INTRAVENOUS

## 2019-04-13 MED ORDER — LEVOFLOXACIN IN D5W 500 MG/100ML IV SOLN
INTRAVENOUS | Status: DC | PRN
  Administered 2019-04-13: 500 mg via INTRAVENOUS

## 2019-04-13 MED ORDER — FENTANYL CITRATE (PF) 100 MCG/2ML IJ SOLN
INTRAMUSCULAR | Status: DC | PRN
  Administered 2019-04-13: 100 ug via INTRAVENOUS

## 2019-04-13 MED ORDER — PHENYLEPHRINE 40 MCG/ML (10ML) SYRINGE FOR IV PUSH (FOR BLOOD PRESSURE SUPPORT)
PREFILLED_SYRINGE | INTRAVENOUS | Status: DC | PRN
  Administered 2019-04-13 (×3): 120 ug via INTRAVENOUS
  Administered 2019-04-13: 80 ug via INTRAVENOUS
  Administered 2019-04-13: 120 ug via INTRAVENOUS
  Administered 2019-04-13 (×2): 80 ug via INTRAVENOUS
  Administered 2019-04-13: 120 ug via INTRAVENOUS

## 2019-04-13 MED ORDER — ONDANSETRON HCL 4 MG/2ML IJ SOLN
INTRAMUSCULAR | Status: DC | PRN
  Administered 2019-04-13: 4 mg via INTRAVENOUS

## 2019-04-13 MED ORDER — LACTATED RINGERS IV SOLN: INTRAVENOUS | Status: DC | PRN

## 2019-04-13 MED ORDER — GLUCAGON HCL RDNA (DIAGNOSTIC) 1 MG IJ SOLR
INTRAMUSCULAR | Status: AC
  Filled 2019-04-13: qty 1

## 2019-04-13 MED ORDER — GLUCAGON HCL RDNA (DIAGNOSTIC) 1 MG IJ SOLR
INTRAMUSCULAR | Status: DC | PRN
  Administered 2019-04-13 (×2): .2 mg via INTRAVENOUS

## 2019-04-13 NOTE — Telephone Encounter (Signed)
Called to discuss with patient about Covid symptoms and the use of bamlanivimab or casirivimab/imdevimab, a monoclonal antibody infusion for those with mild to moderate Covid symptoms and at a high risk of hospitalization.  Pt is qualified for this infusion at the [facility] infusion center due to Age > 65, HTN, CVD  Message left to call back. I also sent him a [employer] message.   Angelena Form PA-C  [facility]

## 2019-04-13 NOTE — Anesthesia Preprocedure Evaluation (Addendum)
Anesthesia Evaluation  Patient identified by MRN, date of birth, ID band Patient awake    Reviewed: Allergy & Precautions, NPO status , Patient's Chart, lab work & pertinent test results, reviewed documented beta blocker date and time   Airway Mallampati: II  TM Distance: >3 FB Neck ROM: Full    Dental no notable dental hx. (+) Teeth Intact, Dental Advisory Given   Pulmonary sleep apnea and Continuous Positive Airway Pressure Ventilation ,  COVID positive, symptoms include congestion and fever   Pulmonary exam normal breath sounds clear to auscultation       Cardiovascular hypertension, Pt. on home beta blockers and Pt. on medications + CAD  Normal cardiovascular exam+ dysrhythmias (on eliquis) Atrial Fibrillation  Rhythm:Regular Rate:Normal  TTE 11/2018 1. Left ventricular ejection fraction, by visual estimation, is 60 to 65%. The left ventricle has normal function. Normal left ventricular size. There is moderately increased concentric left ventricular hypertrophy.  2. Left ventricular diastolic Doppler parameters are consistent with pseudonormalization pattern of LV diastolic filling with elevated left atrial pressure.  3. Global right ventricle has normal systolic function.The right ventricular size is normal. No increase in right ventricular wall thickness.  4. Left atrial size was severely dilated.  5. Right atrial size was mildly dilated.  6. The mitral valve is normal in structure. Mild to moderate mitral valve regurgitation. No evidence of mitral stenosis.  7. The tricuspid valve is normal in structure. Tricuspid valve regurgitation mild-moderate.  8. The aortic valve is abnormal with thickened and calcified leaflets.  Aortic valve regurgitation is mild to moderate by color flow Doppler. Mild aortic valve sclerosis without stenosis.  9. The pulmonic valve was normal in structure. Pulmonic valve regurgitation is trivial  by color flow Doppler.  10. Aneurysm of the ascending aorta, measuring 43 mm.  11. Normal pulmonary artery systolic pressure.    Neuro/Psych negative neurological ROS  negative psych ROS   GI/Hepatic Neg liver ROS, GERD  Medicated,  Endo/Other  negative endocrine ROS  Renal/GU negative Renal ROS  negative genitourinary   Musculoskeletal  (+) Arthritis ,   Abdominal   Peds  Hematology negative hematology ROS (+)   Anesthesia Other Findings   Reproductive/Obstetrics                            Anesthesia Physical Anesthesia Plan  ASA: III  Anesthesia Plan: General   Post-op Pain Management:    Induction: Intravenous and Rapid sequence  PONV Risk Score and Plan: 2 and Midazolam, Dexamethasone and Ondansetron  Airway Management Planned: Oral ETT  Additional Equipment:   Intra-op Plan:   Post-operative Plan: Extubation in OR  Informed Consent: I have reviewed the patients History and Physical, chart, labs and discussed the procedure including the risks, benefits and alternatives for the proposed anesthesia with the patient or authorized representative who has indicated his/her understanding and acceptance.     Dental advisory given  Plan Discussed with: CRNA  Anesthesia Plan Comments:         Anesthesia Quick Evaluation

## 2019-04-13 NOTE — Anesthesia Procedure Notes (Signed)
Procedure Name: Intubation Date/Time: 04/13/2019 2:23 PM Performed by: Silas Sacramento, CRNA Pre-anesthesia Checklist: Patient identified, Emergency Drugs available, Suction available and Patient being monitored Patient Re-evaluated:Patient Re-evaluated prior to induction Oxygen Delivery Method: Circle system utilized Preoxygenation: Pre-oxygenation with 100% oxygen Induction Type: IV induction and Rapid sequence Ventilation: Mask ventilation without difficulty Laryngoscope Size: Mac and 4 Grade View: Grade I Tube type: Oral Tube size: 7.5 mm Number of attempts: 1 Airway Equipment and Method: Stylet and Oral airway Placement Confirmation: ETT inserted through vocal cords under direct vision,  positive ETCO2 and breath sounds checked- equal and bilateral Secured at: 25 cm Tube secured with: Tape Dental Injury: Teeth and Oropharynx as per pre-operative assessment and Injury to lip  Comments: G1V, initial intubation easy, but leak noted. D/L showed some cuff herniation at glottic opening. Balloon was deflated and tube advanced to 25 cm under DL. BBS, +ETCO2 noted, with leak no longer detectable. Small injury to pt's lip noted.

## 2019-04-13 NOTE — Transfer of Care (Signed)
Immediate Anesthesia Transfer of Care Note  Patient: Spencer Cochran  Procedure(s) Performed: ENDOSCOPIC RETROGRADE CHOLANGIOPANCREATOGRAPHY (ERCP) WITH PROPOFOL (N/A ) BALLOON DILATION (N/A ) SPHINCTEROTOMY REMOVAL OF STONES  Patient Location: PACU  Anesthesia Type:General  Level of Consciousness: drowsy, patient cooperative and responds to stimulation  Airway & Oxygen Therapy: Patient Spontanous Breathing and Patient connected to face mask oxygen  Post-op Assessment: Report given to RN and Post -op Vital signs reviewed and stable  Post vital signs: Reviewed and stable  Last Vitals:  Vitals Value Taken Time  BP    Temp    Pulse    Resp    SpO2      Last Pain:  Vitals:   04/13/19 1348  TempSrc: Oral  PainSc: 0-No pain         Complications: No apparent anesthesia complications

## 2019-04-13 NOTE — Anesthesia Postprocedure Evaluation (Signed)
Anesthesia Post Note  Patient: Spencer Cochran  Procedure(s) Performed: ENDOSCOPIC RETROGRADE CHOLANGIOPANCREATOGRAPHY (ERCP) WITH PROPOFOL (N/A ) BALLOON DILATION (N/A ) SPHINCTEROTOMY REMOVAL OF STONES     Patient location during evaluation: PACU Anesthesia Type: General Level of consciousness: awake and alert Pain management: pain level controlled Vital Signs Assessment: post-procedure vital signs reviewed and stable Respiratory status: spontaneous breathing, nonlabored ventilation, respiratory function stable and patient connected to nasal cannula oxygen Cardiovascular status: blood pressure returned to baseline and stable Postop Assessment: no apparent nausea or vomiting Anesthetic complications: no    Last Vitals:  Vitals:   04/13/19 1623 04/13/19 1635  BP: 111/80 117/81  Pulse: 97 92  Resp: 16 14  Temp:    SpO2:      Last Pain:  Vitals:   04/13/19 1635  TempSrc:   PainSc: 0-No pain                 Bryton Waight S

## 2019-04-13 NOTE — Interval H&P Note (Signed)
History and Physical Interval Note:  04/13/2019 2:21 PM  Spencer Cochran  has presented today for surgery, with the diagnosis of obstructive jaundice, choledocholithiasis.  The various methods of treatment have been discussed with the patient and family. After consideration of risks, benefits and other options for treatment, the patient has consented to  Procedure(s): ENDOSCOPIC RETROGRADE CHOLANGIOPANCREATOGRAPHY (ERCP) WITH PROPOFOL (N/A) as a surgical intervention.  The patient's history has been reviewed, patient examined, no change in status, stable for surgery.  I have reviewed the patient's chart and labs.  Questions were answered to the patient's satisfaction.     Jackquline Denmark

## 2019-04-13 NOTE — Op Note (Signed)
[facility] Patient Name: Spencer Cochran Procedure Date: 04/13/2019 MRN: [id number] Attending MD: Jackquline Denmark , MD Date of Birth: April 11, 1945 CSN: [id number] Age: 74 Admit Type: Outpatient Procedure:                ERCP Indications:              Bile duct stone(s) on MRCP with obstructive                            jaundice. Unfortunately COVID-19 was positive                            yesterday. Providers:                Jackquline Denmark, MD, Glori Bickers, RN, Elspeth Cho                            Tech., Technician, Dayle Points, CRNA Referring MD:             Dr. Maryella Shivers Medicines:                General Anesthesia Complications:            No immediate complications. Estimated Blood Loss:     Estimated blood loss was minimal. Procedure:                Pre-Anesthesia Assessment:                           - Prior to the procedure, a History and Physical                            was performed, and patient medications and                            allergies were reviewed. The patient's tolerance of                            previous anesthesia was also reviewed. The risks                            and benefits of the procedure and the sedation                            options and risks were discussed with the patient.                            All questions were answered, and informed consent                            was obtained. Prior Anticoagulants: The patient has                            taken Eliquis (apixaban), last dose was 2 days  prior to procedure. ASA Grade Assessment: III - A                            patient with severe systemic disease. After                            reviewing the risks and benefits, the patient was                            deemed in satisfactory condition to undergo the                            procedure.                           After obtaining informed consent, the scope was                         passed under direct vision. Throughout the                            procedure, the patient's blood pressure, pulse, and                            oxygen saturations were monitored continuously. The                            Duodenoscope was introduced through the mouth, and                            used to inject contrast into and used to inject                            contrast into the bile duct. The ERCP was                            accomplished without difficulty. The patient                            tolerated the procedure well. Scope In: Scope Out: Findings:      The scout film was normal. The esophagus was successfully intubated       under direct vision. The scope was advanced to a normal major papilla in       the descending duodenum without detailed examination of the pharynx,       larynx and associated structures, and upper GI tract. The upper GI tract       was grossly normal.      Major papilla was bulging with minimal pus/thick mucus. The bile duct       was deeply cannulated with the short-nosed traction sphincterotome on       2nd attempt. Contrast was injected. The lower third of the main bile       duct contained one large stone, which was 12 mm in diameter. The common       bile duct (at 14 mm) and common hepatic  duct (at 10 mm) were moderately       dilated. The right and the left hepatic ducts were mildly dilated. A 8       mm biliary sphincterotomy was made at 11:00 o'clock with a monofilament       traction (standard) sphincterotome using ERBE's electrocautery on       Endo-cut mode. There was no post-sphincterotomy bleeding. We elected to       perform sphincteroplasty d/t size of the stone and patient being on       Eliquis. Sphincteroplasty was successfully performed with CRE balloon       11-13-10 mm balloon (to a maximum balloon size of 12 mm x 1 min each).       Minimal bleeding occurred which stopped on its own. The biliary  tree was       swept with a 15 mm balloon starting at the bifurcation. One large 12 mm       stone was removed (see endoscopic pictures) and some sludge. No stones       remained on postocclusion cine-cholangiogram. The cystic duct did fill.       There were multiple filling defects in the gallbladder consistent with       cholelithiasis. A small air bubble was noted in the right hepatic duct.       The bile was green. There was no pus.      Pancreaticogram-the main pancreatic duct was cannulated on the first       attempt. Only slight amount of contrast was injected. It was normal in       the body and head of the pancreas. The main pancreatic duct in the tail       did not fill. The side branches did not fill. There were no pancreatic       ductal strictures. Impression:               - Choledocholithiasis s/p biliary sphincterotomy                            followed by sphincteroplasty and balloon extraction.                           - Cholelithiasis. No cholecystitis.                           - Small amount of pus was noted at major papilla. Moderate Sedation:      Not Applicable - Patient had care per Anesthesia. Recommendation:           - Discharge patient to home.                           - Levaquin (levofloxacin) 500 mg PO daily for 3                            more days.                           - Watch for pancreatitis, bleeding, perforation,                            and cholangitis. Indocin suppositories x2 were  given.                           - Resume Eliquis in 3 days.                           - Follow-up in GI clinic in 1 week.                           - I have discussed above with Spencer Cochran phone                            [phone] in detail. I have also given patient                            my cell phone number. They are welcome to call me                            if there are any problems.                           - Recommend  surgical consultation for laparoscopic                            cholecystectomy after 10 days of quarantine is                            over. Note that he was tested positive for COVID-19                            yesterday. Procedure Code(s):        --- Professional ---                           [phone], 36, Endoscopic retrograde                            cholangiopancreatography (ERCP); with                            trans-endoscopic balloon dilation of                            biliary/pancreatic duct(s) or of ampulla                            (sphincteroplasty), including sphincterotomy, when                            performed, each duct                           [id number], Endoscopic retrograde                            cholangiopancreatography (ERCP); with removal of  calculi/debris from biliary/pancreatic duct(s) Diagnosis Code(s):        --- Professional ---                           K80.51, Calculus of bile duct without cholangitis                            or cholecystitis with obstruction CPT copyright 2019 [employer]. All rights reserved. The codes documented in this report are preliminary and upon coder review may  be revised to meet current compliance requirements. Jackquline Denmark, MD 04/13/2019 4:21:03 PM This report has been signed electronically. Number of Addenda: 0

## 2019-04-13 NOTE — Discharge Instructions (Signed)
YOU HAD AN ENDOSCOPIC PROCEDURE TODAY: Refer to the procedure report and other information in the discharge instructions given to you for any specific questions about what was found during the examination. If this information does not answer your questions, please call [place] office at [phone] to clarify.  ° °YOU SHOULD EXPECT: Some feelings of bloating in the abdomen. Passage of more gas than usual. Walking can help get rid of the air that was put into your GI tract during the procedure and reduce the bloating. If you had a lower endoscopy (such as a colonoscopy or flexible sigmoidoscopy) you may notice spotting of blood in your stool or on the toilet paper. Some abdominal soreness may be present for a day or two, also. ° °DIET: Your first meal following the procedure should be a light meal and then it is ok to progress to your normal diet. A half-sandwich or bowl of soup is an example of a good first meal. Heavy or fried foods are harder to digest and may make you feel nauseous or bloated. Drink plenty of fluids but you should avoid alcoholic beverages for 24 hours. If you had a esophageal dilation, please see attached instructions for diet.   ° °ACTIVITY: Your care partner should take you home directly after the procedure. You should plan to take it easy, moving slowly for the rest of the day. You can resume normal activity the day after the procedure however YOU SHOULD NOT DRIVE, use power tools, machinery or perform tasks that involve climbing or major physical exertion for 24 hours (because of the sedation medicines used during the test).  ° °SYMPTOMS TO REPORT IMMEDIATELY: °A gastroenterologist can be reached at any hour. Please call [phone]  for any of the following symptoms:  °Following lower endoscopy (colonoscopy, flexible sigmoidoscopy) °Excessive amounts of blood in the stool  °Significant tenderness, worsening of abdominal pains  °Swelling of the abdomen that is new, acute  °Fever of 100° or  higher  °Following upper endoscopy (EGD, EUS, ERCP, esophageal dilation) °Vomiting of blood or coffee ground material  °New, significant abdominal pain  °New, significant chest pain or pain under the shoulder blades  °Painful or persistently difficult swallowing  °New shortness of breath  °Black, tarry-looking or red, bloody stools ° °FOLLOW UP:  °If any biopsies were taken you will be contacted by phone or by letter within the next 1-3 weeks. Call [phone]  if you have not heard about the biopsies in 3 weeks.  °Please also call with any specific questions about appointments or follow up tests. ° °

## 2019-04-14 ENCOUNTER — Encounter: Payer: Self-pay | Admitting: *Deleted

## 2019-04-14 ENCOUNTER — Telehealth: Payer: Self-pay | Admitting: Infectious Diseases

## 2019-04-14 ENCOUNTER — Telehealth: Payer: Self-pay | Admitting: Gastroenterology

## 2019-04-14 NOTE — Telephone Encounter (Signed)
Called to discuss with patient about Covid symptoms and the use of bamlanivimab, a monoclonal antibody infusion for those with mild to moderate Covid symptoms and at a high risk of hospitalization.  Pt is qualified for this infusion at the [facility] infusion center due to Age > 37   He is currently on day 8 of symptoms. He started feeling poorly on 3/4 and continues with cough and "bronchitis."  Would like to discuss more with his wife if he wants to proceed. His last day to receive infusion would be Sunday.

## 2019-04-14 NOTE — Telephone Encounter (Signed)
POST ERCP NOTE:  Pt doing well. No abdominal pain or nausea or melena.  No respiratory symptoms.  Have also discussed with Lucita Ferrara. Looks like she is having upper respiratory problems.  She got tested for COVID-19 yesterday.  She will follow up with Dr. Nyra Capes over the phone and isolate herself.  I have given her contact numbers.  She is welcome to get in touch with [place] anytime.  Carmell Austria

## 2019-04-14 NOTE — Telephone Encounter (Signed)
Please review and advise.

## 2019-04-18 ENCOUNTER — Ambulatory Visit: Payer: Medicare Other | Admitting: Gastroenterology

## 2019-04-20 ENCOUNTER — Telehealth (INDEPENDENT_AMBULATORY_CARE_PROVIDER_SITE_OTHER): Payer: Medicare Other | Admitting: Gastroenterology

## 2019-04-20 ENCOUNTER — Other Ambulatory Visit: Payer: Self-pay

## 2019-04-20 VITALS — Ht 69.0 in | Wt 160.0 lb

## 2019-04-20 DIAGNOSIS — R634 Abnormal weight loss: Secondary | ICD-10-CM | POA: Diagnosis not present

## 2019-04-20 DIAGNOSIS — I2510 Atherosclerotic heart disease of native coronary artery without angina pectoris: Secondary | ICD-10-CM

## 2019-04-20 DIAGNOSIS — I4891 Unspecified atrial fibrillation: Secondary | ICD-10-CM | POA: Diagnosis not present

## 2019-04-20 DIAGNOSIS — G4733 Obstructive sleep apnea (adult) (pediatric): Secondary | ICD-10-CM

## 2019-04-20 DIAGNOSIS — U071 COVID-19: Secondary | ICD-10-CM | POA: Diagnosis not present

## 2019-04-20 DIAGNOSIS — I10 Essential (primary) hypertension: Secondary | ICD-10-CM | POA: Diagnosis not present

## 2019-04-20 DIAGNOSIS — R7303 Prediabetes: Secondary | ICD-10-CM

## 2019-04-20 DIAGNOSIS — K8050 Calculus of bile duct without cholangitis or cholecystitis without obstruction: Secondary | ICD-10-CM | POA: Diagnosis not present

## 2019-04-20 DIAGNOSIS — Z7901 Long term (current) use of anticoagulants: Secondary | ICD-10-CM | POA: Diagnosis not present

## 2019-04-20 NOTE — Progress Notes (Signed)
Chief Complaint:   Referring Provider:  Maryella Shivers, MD      ASSESSMENT AND PLAN;   #1. Choledocholithiasis s/p ERCP with biliary sphincterotomy with stone extraction 04/13/2019, has symptomatic cholelithiasis without cholecystitis.  #2. Covid-19. Dx 04/12/2019.  #3. Wt loss  #3.  Comorbid conditions include A Fib on eliquis (followed by Dr Raliegh Ip), CAD, OSA, HTN, pre-DM.  Plan: -Patient to get in touch with Dr Nyra Capes for blood work (CBC, CMP) and ? CxR (pt with covid 19) -Lap chole  once cleared by Dr Nyra Capes.  He will arrange for surgical consultation. -Follow-up if still with problems.     HPI:    Spencer Cochran is a 74 y.o. male  For follow-up visit. S/P ERCP with biliary sphincterotomy, sphincteroplasty, extraction of a large impacted stone in the ampulla on 04/13/2019. He was tested positive for COVID-19 on 04/12/2019.  Initially was asymptomatic.  Now having cough productive of whitish/yellowish sputum, some shortness of breath.  No fever or chills.  His wife had COVID-19 as well.  I have asked him to get in touch with Dr. Nyra Capes as soon as possible.  He will call their office today   No jaundice dark urine or pale stools.  No melena or hematochezia.  No abdominal pain.  Told me that his appetite is better than before.  Has lost 4 more pounds over last 1 to 2 weeks.  Unfortunately it is difficult to assess since he also had COVID-19.   Past GI procedures: -Colonoscopy 2019: Dr Melina Copa- neg per patient except diverticulosis.  We do not have the report.  Previous colonoscopy 03/2012: Small tubular adenomas s/p polypectomy, pancolonic diverticulosis predominantly in the left colon, small internal and external hemorrhoids.  Past Medical History:  Diagnosis Date  . Arthritis knees and ankle  . Asymptomatic gallstones   . Cancer ([place])    skin   . Cataract immature BILATERAL EYES  . Coronary artery disease CARDIOLOGIST-  DR Agustin Cree  [facility])---  LAST VISIT  04-29-2011   DENIES CARDIAC SYMPTOMS  . DDD (degenerative disc disease), lumbar   . Dysrhythmia    A Fib   . Frequency of urination   . GERD (gastroesophageal reflux disease)   . Glaucoma    both eyes  . History of colon polyps   . History of kidney stones   . Hypertension   . 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 Left ventricular diastolic dysfunction PER ECHO 03-31-2011  W/ CHART  . Nocturia   . OSA on CPAP    wears cpap  . Pre-diabetes    last hemaglobin a 1 c was 5.6  . Unsteadiness   . Urethral stricture     Past Surgical History:  Procedure Laterality Date  . APPENDECTOMY  1972  . BALLOON DILATION N/A 04/13/2019   Procedure: BALLOON DILATION;  Surgeon: Jackquline Denmark, MD;  Location: [facility];  Service: Endoscopy;  Laterality: N/A;  . CARDIOVASCULAR STRESS TEST  04-29-2010   DR Halford Chessman   NO EVIDENCE ISCHEMIA/ NORMAL LVSF AND WALL MOTION/ EF 63%  . CERVICAL FUSION  2006   C3 - 6  . COLONOSCOPY  03/23/2012   Small colonic polyps, status post polypectomy. Pancolonic diverticulosis predominantly in the left colon.Small internal and external hemorrhoids  . COLONOSCOPY  2019   Dr Orlena Sheldon. Diverticulosis. Doesn't think he removed polpys   . CYSTO/ URETHRAL DILATION/ TRANSURETHRAL INCISIONOF PROSTATE  01-13-2007  . CYSTOSCOPY  2005  .  CYSTOSCOPY W/ RETROGRADES  06/12/2011   Procedure: CYSTOSCOPY WITH RETROGRADE PYELOGRAM;  Surgeon: Ailene Rud, MD;  Location: [facility];  Service: Urology;  Laterality: N/A;  cysto, urethral dilation, right retrograde pyelogram    . CYSTOSCOPY WITH RETROGRADE PYELOGRAM, URETEROSCOPY AND STENT PLACEMENT Left 03/08/2014   Procedure: CYSTOSCOPY WITH LEFT RETROGRADE PYELOGRAM/LEFT  URETEROSCOPY;  Surgeon: Ailene Rud, MD;  Location: [facility];  Service: Urology;  Laterality: Left;  . CYSTOSCOPY WITH URETHRAL DILATATION N/A 04/20/2013   Procedure: CYSTOSCOPY WITH URETHRAL DILATATION;   Surgeon: Ailene Rud, MD;  Location: [facility];  Service: Urology;  Laterality: N/A;  . CYSTOSCOPY/URETEROSCOPY/HOLMIUM LASER/STENT PLACEMENT Left 06/10/2017   Procedure: CYSTOSCOPY/RETROGRADE/URETEROSCOPY/HOLMIUM LASER/STENT PLACEMENT;  Surgeon: Raynelle Bring, MD;  Location: [facility];  Service: Urology;  Laterality: Left;  . CYSTOSCOPY/URETEROSCOPY/HOLMIUM LASER/STENT PLACEMENT Right 03/09/2019   Procedure: CYSTOSCOPY/RETROGRADE/URETEROSCOPY/HOLMIUM LASER/STENT PLACEMENT;  Surgeon: Raynelle Bring, MD;  Location: [facility];  Service: Urology;  Laterality: Right;  ONLY NEEDS 60 MIN  . ENDOSCOPIC RETROGRADE CHOLANGIOPANCREATOGRAPHY (ERCP) WITH PROPOFOL N/A 04/13/2019   Procedure: ENDOSCOPIC RETROGRADE CHOLANGIOPANCREATOGRAPHY (ERCP) WITH PROPOFOL;  Surgeon: Jackquline Denmark, MD;  Location: [facility];  Service: Endoscopy;  Laterality: N/A;  . EXTRACORPOREAL SHOCK WAVE LITHOTRIPSY  01-17-2007   LEFT  . HERNIA REPAIR  01/30/2019   [facility] Left inguinal hernia repair  . HOLMIUM LASER APPLICATION Left 04/10/9371   Procedure: LASER OF LEFT RENAL PELVIC STONE;  Surgeon: Ailene Rud, MD;  Location: [facility];  Service: Urology;  Laterality: Left;  . INGUINAL HERNIA REPAIR  2003  . JOINT REPLACEMENT     total knee right 03-01-17  . LEFT ACHILLES TENDON REPAIR  1992  . MASS EXCISION N/A 11/14/2018   Procedure: EXCISIONAL BIOPSY OF GLANS PENIS;  Surgeon: Raynelle Bring, MD;  Location: [facility];  Service: Urology;  Laterality: N/A;  ONLY NEEDS 60 MIN  . NASAL SINUS SURGERY  2012  . PENILE SURGERY  OF MEATUS  1955  . REMOVAL OF STONES  04/13/2019   Procedure: REMOVAL OF STONES;  Surgeon: Jackquline Denmark, MD;  Location: [facility];  Service: Endoscopy;;  . [name]  04/13/2019   Procedure: [name];  Surgeon: Jackquline Denmark, MD;  Location: [facility];  Service: Endoscopy;;  . TRANSTHORACIC ECHOCARDIOGRAM  03-31-2011   NORMAL LVEF  59%/ TRIVIAL MR/ DIASTOLIC DYSFUNCTION/  MODERATE LVH  . TRANSURETHRAL RESECTION OF PROSTATE N/A 04/20/2013   Procedure: TRANSURETHRAL RESECTION OF THE PROSTATE WITH GYRUS INSTRUMENTS;  Surgeon: Ailene Rud, MD;  Location: [facility];  Service: Urology;  Laterality: N/A;    Family History  Problem Relation Age of Onset  . Breast cancer Mother   . Cirrhosis Father   . Colon cancer Maternal Aunt        died in 26's   . Esophageal cancer Neg Hx     Social History   Tobacco Use  . Smoking status: Never Smoker  . Smokeless tobacco: Never Used  Substance Use Topics  . Alcohol use: No  . Drug use: No    Current Outpatient Medications  Medication Sig Dispense Refill  . amLODipine-benazepril (LOTREL) 5-40 MG capsule TAKE 1 CAPSULE DAILY (Patient taking differently: Take 1 capsule by mouth daily. ) 90 capsule 2  . atorvastatin (LIPITOR) 40 MG tablet Take 40 mg by mouth at bedtime.     . carvedilol (COREG) 25 MG tablet Take 1 tablet (25 mg total) by mouth 2 (two) times daily with a meal. 180 tablet 1  .  ferrous sulfate 325 (65 FE) MG tablet Take 325 mg by mouth daily.    . fluticasone (CUTIVATE) 0.05 % cream Apply topically 2 (two) times daily for 10 days. 30 g 2  . furosemide (LASIX) 40 MG tablet TAKE 1 TABLET EVERY OTHER DAY (Patient taking differently: Take 40 mg by mouth every other day. ) 45 tablet 4  . nitroGLYCERIN (NITROSTAT) 0.4 MG SL tablet Place 0.4 mg under the tongue every 5 (five) minutes x 3 doses as needed for chest pain.     . Omega-3 Fatty Acids (OMEGA 3 PO) Take 520 mg by mouth daily.    Marland Kitchen omeprazole (PRILOSEC) 20 MG capsule Take 20 mg by mouth daily.     . timolol (TIMOPTIC) 0.5 % ophthalmic solution Place 1 drop into both eyes 2 (two) times daily.     Marland Kitchen triamcinolone cream (KENALOG) 0.1 % Apply 1 application topically daily as needed (skin irritation).      No current facility-administered medications for this visit.    Allergies  Allergen Reactions  . Hydrocodone Other (See Comments)    insomnia  "awake for 48 hours"    Review of Systems:  neg     Physical Exam:    Ht 5' 9"  (1.753 m)   Wt 160 lb (72.6 kg)   BMI 23.63 kg/m  Wt Readings from Last 3 Encounters:  04/20/19 160 lb (72.6 kg)  04/13/19 170 lb (77.1 kg)  04/10/19 174 lb 4 oz (79 kg)  Not examined since it was a televisit  Data Reviewed: I have personally reviewed following labs and imaging studies  CBC: CBC Latest Ref Rng & Units 04/11/2019 03/16/2019 03/09/2019  WBC 4.0 - 10.5 K/uL 5.5 6.9 -  Hemoglobin 13.0 - 17.0 g/dL 12.9(L) 12.4(L) 14.3  Hematocrit 39.0 - 52.0 % 37.9(L) 36.9(L) 42.0  Platelets 150.0 - 400.0 K/uL 211.0 253 -    CMP: CMP Latest Ref Rng & Units 04/11/2019 03/16/2019 03/09/2019  Glucose 70 - 99 mg/dL 99 115(H) 106(H)  BUN 6 - 23 mg/dL 15 16 16   Creatinine 0.40 - 1.50 mg/dL 0.76 0.75(L) 0.70  Sodium 135 - 145 mEq/L 142 141 145  Potassium 3.5 - 5.1 mEq/L 3.7 4.0 3.2(L)  Chloride 96 - 112 mEq/L 104 103 106  CO2 19 - 32 mEq/L 32 29 -  Calcium 8.4 - 10.5 mg/dL 8.6 8.7 -  Total Protein 6.0 - 8.3 g/dL 6.6 6.1 -  Total Bilirubin 0.2 - 1.2 mg/dL 2.0(H) 2.9(H) -  Alkaline Phos 39 - 117 U/L 798(H) 836(H) -  AST 0 - 37 U/L 86(H) 91(H) -  ALT 0 - 53 U/L 44 61(H) -   Hepatic Function Latest Ref Rng & Units 04/11/2019 03/16/2019 07/18/2018  Total Protein 6.0 - 8.3 g/dL 6.6 6.1 6.6  Albumin 3.5 - 5.2 g/dL 2.9(L) 2.9(L) 3.9  AST 0 - 37 U/L 86(H) 91(H) 27  ALT 0 - 53 U/L 44 61(H) 13  Alk Phosphatase 39 - 117 U/L 798(H) 836(H) 70  Total Bilirubin 0.2 - 1.2 mg/dL 2.0(H) 2.9(H) 0.6  I connected with  [name] on 04/20/19 by a video enabled telemedicine application and verified that I am speaking with the correct person using two identifiers.   I discussed the limitations of evaluation and management by telemedicine. The patient expressed understanding and agreed to proceed.  Total time spent including coordination of care 25 minutes   Carmell Austria, MD 04/20/2019, 2:43 PM  Cc: Maryella Shivers, MD

## 2019-04-26 DIAGNOSIS — K8010 Calculus of gallbladder with chronic cholecystitis without obstruction: Secondary | ICD-10-CM | POA: Diagnosis not present

## 2019-04-26 NOTE — Telephone Encounter (Signed)
Review of records  -Colonoscopy by Dr. Melina Copa on 10/18/2017: Sigmoid diverticulosis.  Good preparation.  Repeat colonoscopy in 5 years.  -Updated blood work from incarcerated left inguinal hernia repair 01/30/2019 by Dr. Wilford Grist repair of left inguinal hernia with mesh.  Op note reviewed.

## 2019-05-01 DIAGNOSIS — M1990 Unspecified osteoarthritis, unspecified site: Secondary | ICD-10-CM | POA: Diagnosis not present

## 2019-05-01 DIAGNOSIS — G4730 Sleep apnea, unspecified: Secondary | ICD-10-CM | POA: Diagnosis not present

## 2019-05-01 DIAGNOSIS — M8580 Other specified disorders of bone density and structure, unspecified site: Secondary | ICD-10-CM | POA: Diagnosis not present

## 2019-05-01 DIAGNOSIS — H409 Unspecified glaucoma: Secondary | ICD-10-CM | POA: Diagnosis not present

## 2019-05-01 DIAGNOSIS — E785 Hyperlipidemia, unspecified: Secondary | ICD-10-CM | POA: Diagnosis not present

## 2019-05-01 DIAGNOSIS — Z79899 Other long term (current) drug therapy: Secondary | ICD-10-CM | POA: Diagnosis not present

## 2019-05-01 DIAGNOSIS — I10 Essential (primary) hypertension: Secondary | ICD-10-CM | POA: Diagnosis not present

## 2019-05-01 DIAGNOSIS — I4891 Unspecified atrial fibrillation: Secondary | ICD-10-CM | POA: Diagnosis not present

## 2019-05-01 DIAGNOSIS — E119 Type 2 diabetes mellitus without complications: Secondary | ICD-10-CM | POA: Diagnosis not present

## 2019-05-01 DIAGNOSIS — K8020 Calculus of gallbladder without cholecystitis without obstruction: Secondary | ICD-10-CM | POA: Diagnosis not present

## 2019-05-01 DIAGNOSIS — Z7901 Long term (current) use of anticoagulants: Secondary | ICD-10-CM | POA: Diagnosis not present

## 2019-05-01 DIAGNOSIS — I2510 Atherosclerotic heart disease of native coronary artery without angina pectoris: Secondary | ICD-10-CM | POA: Diagnosis not present

## 2019-05-01 DIAGNOSIS — N400 Enlarged prostate without lower urinary tract symptoms: Secondary | ICD-10-CM | POA: Diagnosis not present

## 2019-05-01 DIAGNOSIS — K915 Postcholecystectomy syndrome: Secondary | ICD-10-CM | POA: Diagnosis not present

## 2019-05-01 DIAGNOSIS — K8010 Calculus of gallbladder with chronic cholecystitis without obstruction: Secondary | ICD-10-CM | POA: Diagnosis not present

## 2019-05-01 DIAGNOSIS — H9190 Unspecified hearing loss, unspecified ear: Secondary | ICD-10-CM | POA: Diagnosis not present

## 2019-05-01 DIAGNOSIS — Z9989 Dependence on other enabling machines and devices: Secondary | ICD-10-CM | POA: Diagnosis not present

## 2019-05-03 DIAGNOSIS — I10 Essential (primary) hypertension: Secondary | ICD-10-CM | POA: Diagnosis not present

## 2019-05-03 DIAGNOSIS — R7303 Prediabetes: Secondary | ICD-10-CM | POA: Diagnosis not present

## 2019-05-03 DIAGNOSIS — E870 Hyperosmolality and hypernatremia: Secondary | ICD-10-CM | POA: Diagnosis not present

## 2019-05-03 DIAGNOSIS — I2510 Atherosclerotic heart disease of native coronary artery without angina pectoris: Secondary | ICD-10-CM | POA: Diagnosis not present

## 2019-05-04 ENCOUNTER — Telehealth: Payer: Self-pay | Admitting: Cardiology

## 2019-05-04 NOTE — Telephone Encounter (Signed)
New message   Patient would like all medication to go to the [facility] on [address] in [place], [place]. Please put into the chart.

## 2019-05-04 NOTE — Telephone Encounter (Signed)
Preferred pharmacy updated.

## 2019-05-09 DIAGNOSIS — Z09 Encounter for follow-up examination after completed treatment for conditions other than malignant neoplasm: Secondary | ICD-10-CM | POA: Diagnosis not present

## 2019-05-15 ENCOUNTER — Ambulatory Visit: Payer: Medicare Other | Admitting: Podiatry

## 2019-05-26 ENCOUNTER — Other Ambulatory Visit: Payer: Self-pay

## 2019-05-26 ENCOUNTER — Encounter: Payer: Self-pay | Admitting: Sports Medicine

## 2019-05-26 ENCOUNTER — Ambulatory Visit (INDEPENDENT_AMBULATORY_CARE_PROVIDER_SITE_OTHER): Payer: Medicare Other | Admitting: Sports Medicine

## 2019-05-26 DIAGNOSIS — L600 Ingrowing nail: Secondary | ICD-10-CM

## 2019-05-26 DIAGNOSIS — Z7901 Long term (current) use of anticoagulants: Secondary | ICD-10-CM

## 2019-05-26 DIAGNOSIS — I739 Peripheral vascular disease, unspecified: Secondary | ICD-10-CM

## 2019-05-26 MED ORDER — NEOMYCIN-POLYMYXIN-HC 3.5-10000-1 OT SOLN: OTIC | 0 refills | Status: DC

## 2019-05-26 NOTE — Progress Notes (Signed)
Subjective: Spencer Cochran is a 74 y.o. male patient presents to office today complaining of a moderately painful left first toenail especially at the medial border reports that last nail trim seemed to help but now it has grown out and is really sore reports that he has been using a topical pain reliever as well as taking Aleve without any improvement.  Patient is on Eliquis for cardiac/A. fib with no changes in his medication and reports that he has been experience some swelling currently on his Lasix medication which is helping.  Patient reports that his blood sugar today was 124 last A1c of 5.6 and last visit was last month.  No other issues noted.  Patient Active Problem List   Diagnosis Date Noted  . Obstructive jaundice   . Choledocholithiasis   . Biliary obstruction   . Gait abnormality 08/03/2018  . Paresthesia 07/18/2018  . Coronary artery disease 10/01/2016  . Dyslipidemia 10/01/2016  . Atrial fibrillation ([place]) 03/08/2014  . Benign prostatic hypertrophy with incomplete bladder emptying 04/20/2013    Current Outpatient Medications on File Prior to Visit  Medication Sig Dispense Refill  . amLODipine-benazepril (LOTREL) 5-40 MG capsule TAKE 1 CAPSULE DAILY (Patient taking differently: Take 1 capsule by mouth daily. ) 90 capsule 2  . atorvastatin (LIPITOR) 40 MG tablet Take 40 mg by mouth at bedtime.     . carvedilol (COREG) 25 MG tablet Take 1 tablet (25 mg total) by mouth 2 (two) times daily with a meal. 180 tablet 1  . ELIQUIS 5 MG TABS tablet Take 5 mg by mouth 2 (two) times daily.    . ferrous sulfate 325 (65 FE) MG tablet Take 325 mg by mouth daily.    . furosemide (LASIX) 40 MG tablet TAKE 1 TABLET EVERY OTHER DAY (Patient taking differently: Take 40 mg by mouth every other day. ) 45 tablet 4  . nitroGLYCERIN (NITROSTAT) 0.4 MG SL tablet Place 0.4 mg under the tongue every 5 (five) minutes x 3 doses as needed for chest pain.     . Omega-3 Fatty Acids (OMEGA 3 PO) Take 520 mg  by mouth daily.    Marland Kitchen omeprazole (PRILOSEC) 20 MG capsule Take 20 mg by mouth daily.     . timolol (TIMOPTIC) 0.5 % ophthalmic solution Place 1 drop into both eyes 2 (two) times daily.     Marland Kitchen triamcinolone cream (KENALOG) 0.1 % Apply 1 application topically daily as needed (skin irritation).      No current facility-administered medications on file prior to visit.    Allergies  Allergen Reactions  . Hydrocodone Other (See Comments)    insomnia "awake for 48 hours"    Objective:  There were no vitals filed for this visit.  General: Well developed, nourished, in no acute distress, alert and oriented x3   Dermatology: Skin is warm, dry and supple bilateral.  Left hallux nail appears to be  Moderately incurvated with hyperkeratosis formation at the distal aspects of  the medial nail border. (+) Erythema. (-) Edema. (-) serosanguous  drainage present. The remaining nails appear unremarkable at this time with no acute symptoms however there is incurvation of the right hallux nail as well that is noted. There are no open sores, lesions or other signs of infection  present.  Vascular: Dorsalis Pedis artery 1 out of 4 and Posterior Tibial artery pedal pulses are 0 out of 4 bilateral due to 1+ pitting edema with immedate capillary fill time. Pedal hair growth present.  Neruologic:  Grossly intact via light touch bilateral.  Musculoskeletal: Tenderness to palpation of the left hallux medial nail fold. Muscular strength within normal limits in all groups bilateral.   Assesement and Plan: Problem List Items Addressed This Visit    None    Visit Diagnoses    Ingrown toenail    -  Primary   Long term current use of anticoagulant therapy       PVD (peripheral vascular disease) (HCC)       Relevant Medications   ELIQUIS 5 MG TABS tablet      -Discussed treatment alternatives and plan of care; Explained permanent/temporary nail avulsion and post procedure course to patient. Advised patient on  the risk of doing this nail procedure in the setting of diabetes as well as in the setting of on anticoagulant therapy Patient elects for PNA left hallux medial nail fold - After a verbal and written consent, injected 3 ml of a 50:50 mixture of 2% plain  lidocaine and 0.5% plain marcaine in a normal hallux block fashion. Next, a  betadine prep was performed. Anesthesia was tested and found to be appropriate.  The offending left hallux medial nail border was then incised from the hyponychium to the epinychium. The offending nail border was removed and cleared from the field. The area was curretted for any remaining nail or spicules. Phenol application performed and the area was then flushed with alcohol and dressed with antibiotic cream and a dry sterile dressing. -Patient was instructed to leave the dressing intact for today and begin soaking  in a weak solution of betadine or Epsom salt and water tomorrow. Patient was instructed to  soak for 15-20 minutes each day and apply neosporin/corticosporin and a gauze or bandaid dressing each day. -Patient was instructed to monitor the toe for signs of infection and return to office if toe becomes red, hot or swollen. -Advised ice, elevation, and tylenol or motrin if needed for pain.  -Patient is to return in 2 weeks for follow up care/nail check or sooner if problems arise.  Landis Martins, DPM

## 2019-05-26 NOTE — Patient Instructions (Signed)

## 2019-05-29 ENCOUNTER — Telehealth: Payer: Self-pay

## 2019-05-29 NOTE — Telephone Encounter (Signed)
Pt LVM asking to know for how long he should be soaking with epsom salt. I returned pt's call and stated that since he had an ingrown removed he should be soaking twice daily for 15-20 for up to two weeks or when ever he sees a scab to DC. Pt stated understanding

## 2019-05-31 DIAGNOSIS — Z96651 Presence of right artificial knee joint: Secondary | ICD-10-CM | POA: Diagnosis not present

## 2019-06-02 DIAGNOSIS — I2510 Atherosclerotic heart disease of native coronary artery without angina pectoris: Secondary | ICD-10-CM | POA: Diagnosis not present

## 2019-06-02 DIAGNOSIS — I10 Essential (primary) hypertension: Secondary | ICD-10-CM | POA: Diagnosis not present

## 2019-06-02 DIAGNOSIS — E870 Hyperosmolality and hypernatremia: Secondary | ICD-10-CM | POA: Diagnosis not present

## 2019-06-02 DIAGNOSIS — R7303 Prediabetes: Secondary | ICD-10-CM | POA: Diagnosis not present

## 2019-06-05 ENCOUNTER — Ambulatory Visit (INDEPENDENT_AMBULATORY_CARE_PROVIDER_SITE_OTHER): Payer: Medicare Other | Admitting: Cardiology

## 2019-06-05 ENCOUNTER — Encounter: Payer: Self-pay | Admitting: Cardiology

## 2019-06-05 ENCOUNTER — Other Ambulatory Visit: Payer: Self-pay

## 2019-06-05 VITALS — BP 120/80 | HR 85 | Ht 69.0 in | Wt 181.2 lb

## 2019-06-05 DIAGNOSIS — M7989 Other specified soft tissue disorders: Secondary | ICD-10-CM | POA: Diagnosis not present

## 2019-06-05 DIAGNOSIS — R0602 Shortness of breath: Secondary | ICD-10-CM

## 2019-06-05 DIAGNOSIS — I2510 Atherosclerotic heart disease of native coronary artery without angina pectoris: Secondary | ICD-10-CM | POA: Diagnosis not present

## 2019-06-05 DIAGNOSIS — K831 Obstruction of bile duct: Secondary | ICD-10-CM | POA: Diagnosis not present

## 2019-06-05 DIAGNOSIS — E785 Hyperlipidemia, unspecified: Secondary | ICD-10-CM

## 2019-06-05 DIAGNOSIS — I4821 Permanent atrial fibrillation: Secondary | ICD-10-CM

## 2019-06-05 DIAGNOSIS — G4733 Obstructive sleep apnea (adult) (pediatric): Secondary | ICD-10-CM

## 2019-06-05 HISTORY — DX: Obstructive sleep apnea (adult) (pediatric): G47.33

## 2019-06-05 LAB — COMPREHENSIVE METABOLIC PANEL
ALT: 19 IU/L (ref 0–44)
AST: 37 IU/L (ref 0–40)
Albumin/Globulin Ratio: 1.3 (ref 1.2–2.2)
Albumin: 3.7 g/dL (ref 3.7–4.7)
Alkaline Phosphatase: 109 IU/L (ref 39–117)
BUN/Creatinine Ratio: 19 (ref 10–24)
BUN: 14 mg/dL (ref 8–27)
Bilirubin Total: 0.7 mg/dL (ref 0.0–1.2)
CO2: 26 mmol/L (ref 20–29)
Calcium: 9.2 mg/dL (ref 8.6–10.2)
Chloride: 106 mmol/L (ref 96–106)
Creatinine, Ser: 0.74 mg/dL — ABNORMAL LOW (ref 0.76–1.27)
GFR calc Af Amer: 105 mL/min/{1.73_m2} (ref >59)
GFR calc non Af Amer: 91 mL/min/{1.73_m2} (ref >59)
Globulin, Total: 2.8 g/dL (ref 1.5–4.5)
Glucose: 93 mg/dL (ref 65–99)
Potassium: 4.1 mmol/L (ref 3.5–5.2)
Sodium: 144 mmol/L (ref 134–144)
Total Protein: 6.5 g/dL (ref 6.0–8.5)

## 2019-06-05 LAB — PRO B NATRIURETIC PEPTIDE: NT-Pro BNP: 1110 pg/mL — ABNORMAL HIGH (ref 0–376)

## 2019-06-05 MED ORDER — POTASSIUM CHLORIDE ER 10 MEQ PO TBCR: 10.0000 meq | EXTENDED_RELEASE_TABLET | Freq: Every day | ORAL | 2 refills | Status: DC

## 2019-06-05 MED ORDER — FUROSEMIDE 40 MG PO TABS: 40.0000 mg | ORAL_TABLET | Freq: Every day | ORAL | 2 refills | Status: DC

## 2019-06-05 NOTE — Progress Notes (Signed)
Cardiology Office Note:    Date:  06/05/2019   ID:  Spencer Cochran, DOB 03/14/1945, MRN [id number]  PCP:  Spencer Shivers, MD  Cardiologist:  Spencer Campus, MD    Referring MD: Spencer Shivers, MD   No chief complaint on file. 3 months  History of Present Illness:    Spencer Cochran is a 74 y.o. male with past medical history significant for remote coronary disease, essential hypertension, dyslipidemia, prediabetes.  Recently Spencer Cochran of having obstructive jaundice.  Eventually in the problem cholecystectomy done.  He lost significant amount of weight in the process of evaluation.  Luckily, he is getting much better now.  His appetite is back his taste is back and he is stronger.  He started going back but still not.  Complain of having some swelling of lower extremities as well as fatigue and shortness of breath.  Past Medical History:  Diagnosis Date  . Arthritis knees and ankle  . Asymptomatic gallstones   . Cancer ([place])    skin   . Cataract immature BILATERAL EYES  . Coronary artery disease CARDIOLOGIST-  DR Agustin Cree  [facility])---  LAST VISIT 04-29-2011   DENIES CARDIAC SYMPTOMS  . DDD (degenerative disc disease), lumbar   . Dysrhythmia    A Fib   . Frequency of urination   . GERD (gastroesophageal reflux disease)   . Glaucoma    both eyes  . History of colon polyps   . History of kidney stones   . Hypertension   . 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 Left ventricular diastolic dysfunction PER ECHO 03-31-2011  W/ CHART  . Nocturia   . OSA on CPAP    wears cpap  . Pre-diabetes    last hemaglobin a 1 c was 5.6  . Unsteadiness   . Urethral stricture     Past Surgical History:  Procedure Laterality Date  . APPENDECTOMY  1972  . BALLOON DILATION N/A 04/13/2019   Procedure: BALLOON DILATION;  Surgeon: Jackquline Denmark, MD;  Location: [facility];  Service: Endoscopy;  Laterality: N/A;  . CARDIOVASCULAR STRESS TEST  04-29-2010   DR  Halford Chessman   NO EVIDENCE ISCHEMIA/ NORMAL LVSF AND WALL MOTION/ EF 63%  . CERVICAL FUSION  2006   C3 - 6  . COLONOSCOPY  03/23/2012   Small colonic polyps, status post polypectomy. Pancolonic diverticulosis predominantly in the left colon.Small internal and external hemorrhoids  . COLONOSCOPY  2019   Dr Orlena Sheldon. Diverticulosis. Doesn't think he removed polpys   . CYSTO/ URETHRAL DILATION/ TRANSURETHRAL INCISIONOF PROSTATE  01-13-2007  . CYSTOSCOPY  2005  . CYSTOSCOPY W/ RETROGRADES  06/12/2011   Procedure: CYSTOSCOPY WITH RETROGRADE PYELOGRAM;  Surgeon: Ailene Rud, MD;  Location: [facility];  Service: Urology;  Laterality: N/A;  cysto, urethral dilation, right retrograde pyelogram    . CYSTOSCOPY WITH RETROGRADE PYELOGRAM, URETEROSCOPY AND STENT PLACEMENT Left 03/08/2014   Procedure: CYSTOSCOPY WITH LEFT RETROGRADE PYELOGRAM/LEFT  URETEROSCOPY;  Surgeon: Ailene Rud, MD;  [facility]: [facility];  Service: Urology;  Laterality: Left;  . CYSTOSCOPY WITH URETHRAL DILATATION N/A 04/20/2013   Procedure: CYSTOSCOPY WITH URETHRAL DILATATION;  Surgeon: Ailene Rud, MD;  Location: [facility];  Service: Urology;  Laterality: N/A;  . CYSTOSCOPY/URETEROSCOPY/HOLMIUM LASER/STENT PLACEMENT Left 06/10/2017   Procedure: CYSTOSCOPY/RETROGRADE/URETEROSCOPY/HOLMIUM LASER/STENT PLACEMENT;  Surgeon: Raynelle Bring, MD;  Location: [facility];  Service: Urology;  Laterality: Left;  . CYSTOSCOPY/URETEROSCOPY/HOLMIUM LASER/STENT PLACEMENT Right 03/09/2019   Procedure: CYSTOSCOPY/RETROGRADE/URETEROSCOPY/HOLMIUM  LASER/STENT PLACEMENT;  Surgeon: Raynelle Bring, MD;  [facility];  Service: Urology;  Laterality: Right;  ONLY NEEDS 60 MIN  . ENDOSCOPIC RETROGRADE CHOLANGIOPANCREATOGRAPHY (ERCP) WITH PROPOFOL N/A 04/13/2019   Procedure: ENDOSCOPIC RETROGRADE CHOLANGIOPANCREATOGRAPHY (ERCP) WITH PROPOFOL;  Surgeon: Jackquline Denmark, MD;  [facility];  Service: Endoscopy;   Laterality: N/A;  . EXTRACORPOREAL SHOCK WAVE LITHOTRIPSY  01-17-2007   LEFT  . HERNIA REPAIR  01/30/2019   [facility] Left inguinal hernia repair  . HOLMIUM LASER APPLICATION Left [id number]   Procedure: LASER OF LEFT RENAL PELVIC STONE;  Surgeon: Ailene Rud, MD;  Location: [facility];  Service: Urology;  Laterality: Left;  . INGUINAL HERNIA REPAIR  2003  . JOINT REPLACEMENT     total knee right 03-01-17  . LEFT ACHILLES TENDON REPAIR  1992  . MASS EXCISION N/A 11/14/2018   Procedure: EXCISIONAL BIOPSY OF GLANS PENIS;  Surgeon: Raynelle Bring, MD;  Location[facility];  Service: Urology;  Laterality: N/A;  ONLY NEEDS 60 MIN  . NASAL SINUS SURGERY  2012  . PENILE SURGERY  OF MEATUS  1955  . REMOVAL OF STONES  04/13/2019   Procedure: REMOVAL OF STONES;  Surgeon: Jackquline Denmark, MD;  [facility];  Service: Endoscopy;;  . Spencer [facility]ran  04/13/2019   Procedure: Spencer Cochran;  Surgeon: Jackquline Denmark, MD;  [facility]: [facility];  Service: Endoscopy;;  . TRANSTHORACIC ECHOCARDIOGRAM  03-31-2011   NORMAL LVEF  59%/ TRIVIAL MR/ DIASTOLIC DYSFUNCTION/ MODERATE LVH  . TRANSURETHRAL RESECTION OF PROSTATE N/A 04/20/2013   Procedure: TRANSURETHRAL RESECTION OF THE PROSTATE WITH GYRUS INSTRUMENTS;  Surgeon: Ailene Rud, MD;  Location: [facility];  Service: Urology;  Laterality: N/A;    Current Medications: Current Meds  Medication Sig  . amLODipine-benazepril (LOTREL) 5-40 MG capsule TAKE 1 CAPSULE DAILY  . atorvastatin (LIPITOR) 40 MG tablet Take 40 mg by mouth at bedtime.   . carvedilol (COREG) 25 MG tablet Take 1 tablet (25 mg total) by mouth 2 (two) times daily with a meal.  . ELIQUIS 5 MG TABS tablet Take 5 mg by mouth 2 (two) times daily.  . ferrous sulfate 325 (65 FE) MG tablet Take 325 mg by mouth daily.  . furosemide (LASIX) 40 MG tablet TAKE 1 TABLET EVERY OTHER DAY  . neomycin-polymyxin-hydrocortisone (CORTISPORIN) OTIC solution Apply 2-3 drops to the ingrown  toenail site twice daily. Cover with band-aid.  Marland Kitchen nitroGLYCERIN (NITROSTAT) 0.4 MG SL tablet Place 0.4 mg under the tongue every 5 (five) minutes x 3 doses as needed for chest pain.   . Omega-3 Fatty Acids (OMEGA 3 PO) Take 520 mg by mouth daily.  Marland Kitchen omeprazole (PRILOSEC) 20 MG capsule Take 20 mg by mouth daily.   . timolol (TIMOPTIC) 0.5 % ophthalmic solution Place 1 drop into both eyes 2 (two) times daily.   Marland Kitchen triamcinolone cream (KENALOG) 0.1 % Apply 1 application topically daily as needed (skin irritation).      Allergies:   Hydrocodone   Social History   Socioeconomic History  . Marital status: Married    Spouse name: Not on file  . Number of children: 2  . Years of education: some college  . Highest education level: Not on file  Occupational History  . Occupation: retired Social research officer, government  Tobacco Use  . Smoking status: Never Smoker  . Smokeless tobacco: Never Used  Substance and Sexual Activity  . Alcohol use: No  . Drug use: No  . Sexual activity: Yes  Other Topics Concern  . Not on file  Social History Narrative   Lives at home with his wife.   Right-handed.   Caffeine use:  32-40 ounces caffeine per day.   Social Determinants of Health   Financial Resource Strain:   . Difficulty of Paying Living Expenses:   Food Insecurity:   . Worried About Charity fundraiser in the Last Year:   . Arboriculturist in the Last Year:   Transportation Needs:   . Film/video editor (Medical):   Marland Kitchen Lack of Transportation (Non-Medical):   Physical Activity:   . Days of Exercise per Week:   . Minutes of Exercise per Session:   Stress:   . Feeling of Stress :   Social Connections:   . Frequency of Communication with Friends and Family:   . Frequency of Social Gatherings with Friends and Family:   . Attends Religious Services:   . Active Member of Clubs or Organizations:   . Attends Archivist Meetings:   Marland Kitchen Marital Status:      Family History: The patient's family  history includes Breast cancer in his mother; Cirrhosis in his father; Colon cancer in his maternal aunt. There is no history of Esophageal cancer. ROS:   Please see the history of present illness.    All 14 point review of systems negative except as described per history of present illness  EKGs/Labs/Other Studies Reviewed:      Recent Labs: 03/16/2019: TSH 0.643 04/11/2019: ALT 44; BUN 15; Creatinine, Ser 0.76; Hemoglobin 12.9; Platelets 211.0; Potassium 3.7; Sodium 142  Recent Lipid Panel No results found for: CHOL, TRIG, HDL, CHOLHDL, VLDL, LDLCALC, LDLDIRECT  Physical Exam:    VS:  BP 120/80   Pulse 85   Ht 5\' 9"  (1.753 m)   Wt 181 lb 3.2 oz (82.2 kg)   SpO2 97%   BMI 26.76 kg/m     Wt Readings from Last 3 Encounters:  06/05/19 181 lb 3.2 oz (82.2 kg)  04/20/19 160 lb (72.6 kg)  04/13/19 170 lb (77.1 kg)     GEN:  Well nourished, well developed in no acute distress HEENT: Normal NECK: No JVD; No carotid bruits LYMPHATICS: No lymphadenopathy CARDIAC: RRR, no murmurs, no rubs, no gallops RESPIRATORY:  Clear to auscultation without rales, wheezing or rhonchi  ABDOMEN: Soft, non-tender, non-distended MUSCULOSKELETAL:  No edema; No deformity  SKIN: Warm and dry LOWER EXTREMITIES: no swelling NEUROLOGIC:  Alert and oriented x 3 PSYCHIATRIC:  Normal affect   ASSESSMENT:    1. Permanent atrial fibrillation ([place])   2. Coronary artery disease involving native coronary artery of native heart without angina pectoris   3. Dyslipidemia   4. Obstructive jaundice   5. Obstructive sleep apnea    PLAN:    In order of problems listed above:  1. Chronic atrial fibrillation.  Rate control is anticoagulated with Eliquis which I will continue.  We will check Chem-12 today to make sure the dose is appropriate. 2. Coronary disease stable denies having any issues. 3. Dyslipidemia he is on Lipitor 40 which I will continue.  I will let him recover from surgery before rechecking his  fasting lipid. 4. Obstructive jaundice pending kidney stone removal and then cholecystectomy. 5. Obstructive sleep apnea he will be referred to our sleep specialist.  He does have his CPAP mask from 2002.  Again will refer him to our sleep specialist. 6. I will increase dose of furosemide from 40 mg a  day to 40 every day, magnesium 100 because of potassium, from October showing preserved left ventricle ejection fraction, I will check his proBNP as well as complete metabolic panel today.   Medication Adjustments/Labs and Tests Ordered: Current medicines are reviewed at length with the patient today.  Concerns regarding medicines are outlined above.  No orders of the defined types were placed in this encounter.  Medication changes: No orders of the defined types were placed in this encounter.   Signed, Park Liter, MD, [facility] 06/05/2019 11:06 AM    [place]

## 2019-06-05 NOTE — Patient Instructions (Signed)
Medication Instructions:  Your physician has recommended you make the following change in your medication:    INCREASE: Lasix to 40 mg daily   START: Potassium 10 meq daily   *If you need a refill on your cardiac medications before your next appointment, please call your pharmacy*   Lab Work: Your physician recommends that you return for lab work today: pro bnp, cmp  If you have labs (blood work) drawn today and your tests are completely normal, you will receive your results only by: Marland Kitchen MyChart Message (if you have MyChart) OR . A paper copy in the mail If you have any lab test that is abnormal or we need to change your treatment, we will call you to review the results.   Testing/Procedures: None.    Follow-Up: At [facility], you and your health needs are our priority.  As part of our continuing mission to provide you with exceptional heart care, we have created designated Provider Care Teams.  These Care Teams include your primary Cardiologist (physician) and Advanced Practice Providers (APPs -  Physician Assistants and Nurse Practitioners) who all work together to provide you with the care you need, when you need it.  We recommend signing up for the patient portal called "MyChart".  Sign up information is provided on this After Visit Summary.  MyChart is used to connect with patients for Virtual Visits (Telemedicine).  Patients are able to view lab/test results, encounter notes, upcoming appointments, etc.  Non-urgent messages can be sent to your provider as well.   To learn more about what you can do with MyChart, go to [email or web address].    Your next appointment:   3 month(s)  The format for your next appointment:   In Person  Provider:   Jenne Campus, MD   Other Instructions  Dr. Agustin Cree has referred you to a sleep specialist. They should call you within one week if not please call our office.

## 2019-06-05 NOTE — Addendum Note (Signed)
Addended by: Ashok Norris on: 06/05/2019 11:24 AM   Modules accepted: Orders

## 2019-06-06 ENCOUNTER — Telehealth: Payer: Self-pay | Admitting: Cardiology

## 2019-06-06 NOTE — Telephone Encounter (Signed)
-----   Message from Park Liter, MD sent at 06/06/2019  1:04 PM EDT ----- Labs showed chf, so we increased lasix, that should helps, proteins are ok

## 2019-06-06 NOTE — Telephone Encounter (Signed)
Spoke with patient regarding results and recommendation.  Patient verbalizes understanding and is agreeable to plan of care. Advised patient to call back with any issues or concerns.  

## 2019-06-06 NOTE — Telephone Encounter (Signed)
Patient calling stating his lab results were way out of normal range and he would like to have a nurse call back and discuss them.

## 2019-06-09 ENCOUNTER — Ambulatory Visit (INDEPENDENT_AMBULATORY_CARE_PROVIDER_SITE_OTHER): Payer: Medicare Other | Admitting: Sports Medicine

## 2019-06-09 ENCOUNTER — Encounter: Payer: Self-pay | Admitting: Sports Medicine

## 2019-06-09 ENCOUNTER — Other Ambulatory Visit: Payer: Self-pay

## 2019-06-09 DIAGNOSIS — B351 Tinea unguium: Secondary | ICD-10-CM

## 2019-06-09 DIAGNOSIS — E119 Type 2 diabetes mellitus without complications: Secondary | ICD-10-CM

## 2019-06-09 DIAGNOSIS — Z9889 Other specified postprocedural states: Secondary | ICD-10-CM

## 2019-06-09 DIAGNOSIS — I739 Peripheral vascular disease, unspecified: Secondary | ICD-10-CM

## 2019-06-09 DIAGNOSIS — M79675 Pain in left toe(s): Secondary | ICD-10-CM

## 2019-06-09 DIAGNOSIS — Z872 Personal history of diseases of the skin and subcutaneous tissue: Secondary | ICD-10-CM

## 2019-06-09 DIAGNOSIS — M79674 Pain in right toe(s): Secondary | ICD-10-CM

## 2019-06-09 DIAGNOSIS — Z7901 Long term (current) use of anticoagulants: Secondary | ICD-10-CM

## 2019-06-09 NOTE — Progress Notes (Signed)
Subjective: Spencer Cochran is a 74 y.o. male patient returns to office today for follow up evaluation after having left hallux medial nail avulsion performed on (05/26/2019). Patient has been soaking using epsom salt and applying topical antibiotic covered with bandaid daily.  Patient reports that it is healing up good but also wants me to trim his other toenails.  Patient reports that his blood sugar was 115 today.  Patient deniesfever/chills/nausea/vomitting/any other related constitutional symptoms at this time.  Patient Active Problem List   Diagnosis Date Noted  . Obstructive sleep apnea 06/05/2019  . Obstructive jaundice   . Choledocholithiasis   . Biliary obstruction   . Gait abnormality 08/03/2018  . Paresthesia 07/18/2018  . Coronary artery disease 10/01/2016  . Dyslipidemia 10/01/2016  . Atrial fibrillation ([place]) 03/08/2014  . Benign prostatic hypertrophy with incomplete bladder emptying 04/20/2013    Current Outpatient Medications on File Prior to Visit  Medication Sig Dispense Refill  . amLODipine-benazepril (LOTREL) 5-40 MG capsule TAKE 1 CAPSULE DAILY 90 capsule 2  . atorvastatin (LIPITOR) 40 MG tablet Take 40 mg by mouth at bedtime.     . carvedilol (COREG) 25 MG tablet Take 1 tablet (25 mg total) by mouth 2 (two) times daily with a meal. 180 tablet 1  . ELIQUIS 5 MG TABS tablet Take 5 mg by mouth 2 (two) times daily.    . ferrous sulfate 325 (65 FE) MG tablet Take 325 mg by mouth daily.    . furosemide (LASIX) 40 MG tablet Take 1 tablet (40 mg total) by mouth daily. 30 tablet 2  . neomycin-polymyxin-hydrocortisone (CORTISPORIN) OTIC solution Apply 2-3 drops to the ingrown toenail site twice daily. Cover with band-aid. 10 mL 0  . nitroGLYCERIN (NITROSTAT) 0.4 MG SL tablet Place 0.4 mg under the tongue every 5 (five) minutes x 3 doses as needed for chest pain.     . Omega-3 Fatty Acids (OMEGA 3 PO) Take 520 mg by mouth daily.    Marland Kitchen omeprazole (PRILOSEC) 20 MG capsule Take 20  mg by mouth daily.     . potassium chloride (KLOR-CON) 10 MEQ tablet Take 1 tablet (10 mEq total) by mouth daily. 30 tablet 2  . timolol (TIMOPTIC) 0.5 % ophthalmic solution Place 1 drop into both eyes 2 (two) times daily.     Marland Kitchen triamcinolone cream (KENALOG) 0.1 % Apply 1 application topically daily as needed (skin irritation).      No current facility-administered medications on file prior to visit.    Allergies  Allergen Reactions  . Hydrocodone Other (See Comments)    insomnia "awake for 48 hours"    Objective:  General: Well developed, nourished, in no acute distress, alert and oriented x3   Dermatology: Skin is warm, dry and supple bilateral.  Left hallux medial nail bed appears to be clean, dry, with mild granular tissue and surrounding eschar/scab. (-) Erythema. (-) Edema. (-) serosanguous drainage present. The remaining nails are mildly elongated but appear unremarkable at this time however there is history of ingrowing as well noted to the right hallux medial margin. There are no other lesions or other signs of infection present.  Neurovascular status: Intact. No lower extremity swelling; No pain with calf compression bilateral.  Musculoskeletal: Decreased tenderness to palpation of the left hallux medial nail fold. Muscular strength within normal limits bilateral.   Assesement and Plan: Problem List Items Addressed This Visit    None    Visit Diagnoses    Status post nail surgery    -  Primary   Long term current use of anticoagulant therapy       PVD (peripheral vascular disease) (HCC)       Pain due to onychomycosis of toenails of both feet       History of ingrowing nail       Diabetes mellitus without complication ([place])           -Examined patient  -Cleansed left hallux nail applied antibiotic cream covered with bandaid.  -Discussed plan of care with patient. -Patient may discontinue soaking since area is well healed but still may protect when in shoe with a  Band-Aid.  May leave open to air at night. -Educated patient on long term care after nail surgery. -Patient was instructed to monitor the toe for reoccurrence and signs of infection; Patient advised to return to office or go to ER if toe becomes red, hot or swollen. -Patient is to return as scheduled to do his right ingrown or sooner if problems arise.  At no additional charge this visit I mechanically debrided all nails using a sterile nail nipper without incident.  Landis Martins, DPM

## 2019-06-12 ENCOUNTER — Telehealth: Payer: Self-pay | Admitting: Cardiology

## 2019-06-12 NOTE — Telephone Encounter (Signed)
Called Kia. She states patient was calling for a sleep study however he needed to just see a sleep specialist first. Reaching out to Dr. Evette Georges team.

## 2019-06-12 NOTE — Telephone Encounter (Signed)
New message   Kia from [facility] states that she needs to speak to the [facility] in this office to get an order setup for a sleep study. Please call to discuss.

## 2019-06-14 NOTE — Telephone Encounter (Signed)
Called and spoke with pt. Scheduled him for an appt with Dr.Kelly to discuss sleep on 06/21/19 at 8:40am Advised pt to call [facility] and has his machine linked to [facility] so adjustments, if needed, could be made. Pt verbalized understanding and had no other questions at this time.

## 2019-06-16 ENCOUNTER — Other Ambulatory Visit: Payer: Self-pay | Admitting: Cardiology

## 2019-06-16 MED ORDER — AMLODIPINE BESY-BENAZEPRIL HCL 5-40 MG PO CAPS: 1.0000 | ORAL_CAPSULE | Freq: Every day | ORAL | 3 refills | Status: DC

## 2019-06-16 NOTE — Telephone Encounter (Signed)
Mediation sent in per request.

## 2019-06-16 NOTE — Telephone Encounter (Signed)
*  STAT* If patient is at the pharmacy, call can be transferred to refill team.   1. Which medications need to be refilled? (please list name of each medication and dose if known)  New prescription for Amlodipine  2. Which pharmacy/location (including street and city if local pharmacy) is medication to be sent to? [facility] [address] [place]  3. Do they need a 30 day or 90 day supply?  90 days and refills

## 2019-06-19 NOTE — Telephone Encounter (Signed)
agree

## 2019-06-21 ENCOUNTER — Encounter: Payer: Self-pay | Admitting: Cardiovascular Disease

## 2019-06-21 ENCOUNTER — Ambulatory Visit (INDEPENDENT_AMBULATORY_CARE_PROVIDER_SITE_OTHER): Payer: Medicare Other | Admitting: Cardiovascular Disease

## 2019-06-21 ENCOUNTER — Other Ambulatory Visit: Payer: Self-pay

## 2019-06-21 VITALS — BP 110/66 | HR 80 | Ht 70.0 in | Wt 185.0 lb

## 2019-06-21 DIAGNOSIS — K8050 Calculus of bile duct without cholangitis or cholecystitis without obstruction: Secondary | ICD-10-CM | POA: Diagnosis not present

## 2019-06-21 DIAGNOSIS — E785 Hyperlipidemia, unspecified: Secondary | ICD-10-CM | POA: Diagnosis not present

## 2019-06-21 DIAGNOSIS — R634 Abnormal weight loss: Secondary | ICD-10-CM | POA: Diagnosis not present

## 2019-06-21 DIAGNOSIS — I2510 Atherosclerotic heart disease of native coronary artery without angina pectoris: Secondary | ICD-10-CM

## 2019-06-21 DIAGNOSIS — G4733 Obstructive sleep apnea (adult) (pediatric): Secondary | ICD-10-CM

## 2019-06-21 DIAGNOSIS — I4821 Permanent atrial fibrillation: Secondary | ICD-10-CM | POA: Diagnosis not present

## 2019-06-21 NOTE — Patient Instructions (Signed)

## 2019-06-27 ENCOUNTER — Other Ambulatory Visit: Payer: Self-pay | Admitting: Cardiology

## 2019-06-28 ENCOUNTER — Encounter: Payer: Self-pay | Admitting: Cardiovascular Disease

## 2019-06-28 NOTE — Progress Notes (Signed)
Cardiology Office Note    Date:  06/28/2019   ID:  Spencer Cochran, DOB 26-Jun-1945, MRN [id number]  PCP:  Maryella Shivers, MD  Cardiologist:  Shelva Majestic, MD (sleep); Dr. Agustin Cree  Chief Complaint  Patient presents with  . New Patient (Initial Visit)  . Headache  . Edema    Feet and ankles.    History of Present Illness:  Spencer Cochran is a 74 y.o. male who is followed by Dr. Agustin Cree.  And has a history of permanent atrial fibrillation, CAD, hyperlipidemia, as well as sleep apnea.  He presents for initial evaluation with me.  Is retired from [employer] and states that he had an initial sleep study in 2002 done in [place].  He apparently has been on CPAP therapy since that time.  He currently is on his second CPAP machine and apparently has a ResMed CPAP unit with [employer] as his DME.  He has a history of atrial fibrillation as well as obesity and apparently lost over 60 pounds since September 2019.  He also has a history of Guido Sander cholelithiasis and is status post ERCP with biliary sphincterotomy with stone extraction in March 2021 COVID-19 infection in March 2021.  He has not seen a recent sleep physician.  He presents to the office today to establish care with me.  I was able to contact [facility] and to obtain a download from May 04, 2019 through June 02, 2019.  He has a ResMed air sense 10 AutoSet unit with a minimum pressure of 12 and maximum of 14.  Compliance is excellent at 100% with usage greater than 4 hours at 100%, averaging 7 hours and 53 minutes.  AHI was increased at 8.7 and he did have moderate leak with 95th percentile leak from his mask at 57.8 L/min.  Did not have the specifics of his initial sleep study and will try to obtain this.  He did have a history of snoring as well as fatigability.  He presents to establish care with me.    Past Medical History:  Diagnosis Date  . Arthritis knees and ankle  . Asymptomatic gallstones   . Cancer ([place])    skin    . Cataract immature BILATERAL EYES  . Coronary artery disease CARDIOLOGIST-  DR Agustin Cree  [facility])---  LAST VISIT 04-29-2011   DENIES CARDIAC SYMPTOMS  . DDD (degenerative disc disease), lumbar   . Dysrhythmia    A Fib   . Frequency of urination   . GERD (gastroesophageal reflux disease)   . Glaucoma    both eyes  . History of colon polyps   . History of kidney stones   . Hypertension   . 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 Left ventricular diastolic dysfunction PER ECHO 03-31-2011  W/ CHART  . Nocturia   . OSA on CPAP    wears cpap  . Pre-diabetes    last hemaglobin a 1 c was 5.6  . Unsteadiness   . Urethral stricture     Past Surgical History:  Procedure Laterality Date  . APPENDECTOMY  1972  . BALLOON DILATION N/A 04/13/2019   Procedure: BALLOON DILATION;  Surgeon: Jackquline Denmark, MD;  Location: [facility];  Service: Endoscopy;  Laterality: N/A;  . CARDIOVASCULAR STRESS TEST  04-29-2010   DR Halford Chessman   NO EVIDENCE ISCHEMIA/ NORMAL LVSF AND WALL MOTION/ EF 63%  . CERVICAL FUSION  2006   C3 - 6  .  COLONOSCOPY  03/23/2012   Small colonic polyps, status post polypectomy. Pancolonic diverticulosis predominantly in the left colon.Small internal and external hemorrhoids  . COLONOSCOPY  2019   Dr Orlena Sheldon. Diverticulosis. Doesn't think he removed polpys   . CYSTO/ URETHRAL DILATION/ TRANSURETHRAL INCISIONOF PROSTATE  01-13-2007  . CYSTOSCOPY  2005  . CYSTOSCOPY W/ RETROGRADES  06/12/2011   Procedure: CYSTOSCOPY WITH RETROGRADE PYELOGRAM;  Surgeon: Ailene Rud, MD;  Location: [facility];  Service: Urology;  Laterality: N/A;  cysto, urethral dilation, right retrograde pyelogram    . CYSTOSCOPY WITH RETROGRADE PYELOGRAM, URETEROSCOPY AND STENT PLACEMENT Left 03/08/2014   Procedure: CYSTOSCOPY WITH LEFT RETROGRADE PYELOGRAM/LEFT  URETEROSCOPY;  Surgeon: Ailene Rud, MD;  [facility];  Service: Urology;   Laterality: Left;  . CYSTOSCOPY WITH URETHRAL DILATATION N/A 04/20/2013   Procedure: CYSTOSCOPY WITH URETHRAL DILATATION;  Surgeon: Ailene Rud, MD;  [facility];  Service: Urology;  Laterality: N/A;  . CYSTOSCOPY/URETEROSCOPY/HOLMIUM LASER/STENT PLACEMENT Left 06/10/2017   Procedure: CYSTOSCOPY/RETROGRADE/URETEROSCOPY/HOLMIUM LASER/STENT PLACEMENT;  Surgeon: Raynelle Bring, MD;  [facility];  Service: Urology;  Laterality: Left;  . CYSTOSCOPY/URETEROSCOPY/HOLMIUM LASER/STENT PLACEMENT Right 03/09/2019   Procedure: CYSTOSCOPY/RETROGRADE/URETEROSCOPY/HOLMIUM LASER/STENT PLACEMENT;  Surgeon: Raynelle Bring, MD;  [facility];  Service: Urology;  Laterality: Right;  ONLY NEEDS 60 MIN  . ENDOSCOPIC RETROGRADE CHOLANGIOPANCREATOGRAPHY (ERCP) WITH PROPOFOL N/A 04/13/2019   Procedure: ENDOSCOPIC RETROGRADE CHOLANGIOPANCREATOGRAPHY (ERCP) WITH PROPOFOL;  Surgeon: Jackquline Denmark, MD;  [facility];  Service: Endoscopy;  Laterality: N/A;  . EXTRACORPOREAL SHOCK WAVE LITHOTRIPSY  01-17-2007   LEFT  . HERNIA REPAIR  01/30/2019   [facility] Left inguinal hernia repair  . HOLMIUM LASER APPLICATION Left 03/10/3333   Procedure: LASER OF LEFT RENAL PELVIC STONE;  Surgeon: Ailene Rud, MD;  [facility];  Service: Urology;  Laterality: Left;  . INGUINAL HERNIA REPAIR  2003  . JOINT REPLACEMENT     total knee right 03-01-17  . LEFT ACHILLES TENDON REPAIR  1992  . MASS EXCISION N/A 11/14/2018   Procedure: EXCISIONAL BIOPSY OF GLANS PENIS;  Surgeon: Raynelle Bring, MD;  [facility];  Service: Urology;  Laterality: N/A;  ONLY NEEDS 60 MIN  . NASAL SINUS SURGERY  2012  . PENILE SURGERY  OF MEATUS  1955  . REMOVAL OF STONES  04/13/2019   Procedure: REMOVAL OF STONES;  Surgeon: Jackquline Denmark, MD;  [facility];  Service: Endoscopy;;  . [name]  04/13/2019   Procedure: [name];  Surgeon: Jackquline Denmark, MD;  [facility];  Service: Endoscopy;;  . TRANSTHORACIC ECHOCARDIOGRAM  03-31-2011   NORMAL LVEF  59%/ TRIVIAL MR/ DIASTOLIC DYSFUNCTION/ MODERATE LVH  . TRANSURETHRAL RESECTION OF PROSTATE N/A 04/20/2013   Procedure: TRANSURETHRAL RESECTION OF THE PROSTATE WITH GYRUS INSTRUMENTS;  Surgeon: Ailene Rud, MD;  [facility];  Service: Urology;  Laterality: N/A;    Current Medications: Outpatient Medications Prior to Visit  Medication Sig Dispense Refill  . amLODipine-benazepril (LOTREL) 5-40 MG capsule Take 1 capsule by mouth daily. 90 capsule 3  . atorvastatin (LIPITOR) 40 MG tablet Take 40 mg by mouth at bedtime.     . carvedilol (COREG) 25 MG tablet Take 1 tablet (25 mg total) by mouth 2 (two) times daily with a meal. 180 tablet 1  . ELIQUIS 5 MG TABS tablet Take 5 mg by mouth 2 (two) times daily.    . ferrous sulfate 325 (65 FE) MG tablet Take 325 mg by mouth  daily.    . neomycin-polymyxin-hydrocortisone (CORTISPORIN) OTIC solution Apply 2-3 drops to the ingrown toenail site twice daily. Cover with band-aid. 10 mL 0  . nitroGLYCERIN (NITROSTAT) 0.4 MG SL tablet Place 0.4 mg under the tongue every 5 (five) minutes x 3 doses as needed for chest pain.     . Omega-3 Fatty Acids (OMEGA 3 PO) Take 520 mg by mouth daily.    Marland Kitchen omeprazole (PRILOSEC) 20 MG capsule Take 20 mg by mouth daily.     . timolol (TIMOPTIC) 0.5 % ophthalmic solution Place 1 drop into both eyes 2 (two) times daily.     Marland Kitchen triamcinolone cream (KENALOG) 0.1 % Apply 1 application topically daily as needed (skin irritation).     . furosemide (LASIX) 40 MG tablet Take 1 tablet (40 mg total) by mouth daily. 30 tablet 2  . potassium chloride (KLOR-CON) 10 MEQ tablet Take 1 tablet (10 mEq total) by mouth daily. 30 tablet 2   No facility-administered medications prior to visit.     Allergies:   Hydrocodone   Social History   Socioeconomic History  . Marital status: Married    Spouse name: Not on file  . Number of  children: 2  . Years of education: some college  . Highest education level: Not on file  Occupational History  . Occupation: retired Social research officer, government  Tobacco Use  . Smoking status: Never Smoker  . Smokeless tobacco: Never Used  Substance and Sexual Activity  . Alcohol use: No  . Drug use: No  . Sexual activity: Yes  Other Topics Concern  . Not on file  Social History Narrative   Lives at home with his wife.   Right-handed.   Caffeine use:  32-40 ounces caffeine per day.   Social Determinants of Health   Financial Resource Strain:   . Difficulty of Paying Living Expenses:   Food Insecurity:   . Worried About Charity fundraiser in the Last Year:   . Arboriculturist in the Last Year:   Transportation Needs:   . Film/video editor (Medical):   Marland Kitchen Lack of Transportation (Non-Medical):   Physical Activity:   . Days of Exercise per Week:   . Minutes of Exercise per Session:   Stress:   . Feeling of Stress :   Social Connections:   . Frequency of Communication with Friends and Family:   . Frequency of Social Gatherings with Friends and Family:   . Attends Religious Services:   . Active Member of Clubs or Organizations:   . Attends Archivist Meetings:   Marland Kitchen Marital Status:     Social history is notable he is married for 42 years he has 2 children.  He is retired and previously worked as a Designer, industrial/product.  Family History:  The patient's family history includes Breast cancer in his mother; Cirrhosis in his father; Colon cancer in his maternal aunt.  His father died at age 23 with liver cancer and CHF.  Mother died at 39 with heart failure.  ROS General: Negative; No fevers, chills, or night sweats;  HEENT: Negative; No changes in vision or hearing, sinus congestion, difficulty swallowing Pulmonary: Negative; No cough, wheezing, shortness of breath, hemoptysis Cardiovascular: Negative; No chest pain, presyncope, syncope, palpitations GI: For gallbladder  disease. GU: Negative; No dysuria, hematuria, or difficulty voiding Musculoskeletal: Negative; no myalgias, joint pain, or weakness Hematologic/Oncology: Negative; no easy bruising, bleeding Endocrine: Negative; no heat/cold intolerance; no diabetes Neuro: Negative; no  changes in balance, headaches Skin: Negative; No rashes or skin lesions Psychiatric: Negative; No behavioral problems, depression Sleep: Obstructive sleep apnea since 2002 CPAP.  No snoring, daytime sleepiness, hypersomnolence, bruxism, restless legs, hypnogognic hallucinations, no cataplexy Other comprehensive 14 point system review is negative.   PHYSICAL EXAM:   VS:  BP 110/66 (BP Location: Left Arm, Patient Position: Sitting, Cuff Size: Normal)   Pulse 80   Ht 5' 10"  (1.778 m)   Wt 185 lb (83.9 kg)   BMI 26.54 kg/m     Repeat blood pressure pressure by me was 118/70  Wt Readings from Last 3 Encounters:  06/21/19 185 lb (83.9 kg)  06/05/19 181 lb 3.2 oz (82.2 kg)  04/20/19 160 lb (72.6 kg)    General: Alert, oriented, no distress.  Skin: normal turgor, no rashes, warm and dry HEENT: Normocephalic, atraumatic. Pupils equal round and reactive to light; sclera anicteric; extraocular muscles intact;  Nose without nasal septal hypertrophy Mouth/Parynx benign; Mallinpatti scale 3 Neck: No JVD, no carotid bruits; normal carotid upstroke Lungs: clear to ausculatation and percussion; no wheezing or rales Chest wall: without tenderness to palpitation Heart: PMI not displaced, RRR, s1 s2 normal, 1/6 systolic murmur, no diastolic murmur, no rubs, gallops, thrills, or heaves Abdomen: soft, nontender; no hepatosplenomehaly, BS+; abdominal aorta nontender and not dilated by palpation. Back: no CVA tenderness Pulses 2+ Musculoskeletal: full range of motion, normal strength, no joint deformities Extremities: no clubbing cyanosis or edema, Homan's sign negative  Neurologic: grossly nonfocal; Cranial nerves grossly  wnl Psychologic: Normal mood and affect   Studies/Labs Reviewed:   EKG:  EKG is not ordered today.  I personally reviewed the ECG from November 07, 2018 which shows his permanent atrial fibrillation with PVC.  There is left anterior hemiblock.  Recent Labs: BMP Latest Ref Rng & Units 06/05/2019 04/11/2019 03/16/2019  Glucose 65 - 99 mg/dL 93 99 115(H)  BUN 8 - 27 mg/dL 14 15 16   Creatinine 0.76 - 1.27 mg/dL 0.74(L) 0.76 0.75(L)  BUN/Creat Ratio 10 - 24 19 - 21  Sodium 134 - 144 mmol/L 144 142 141  Potassium 3.5 - 5.2 mmol/L 4.1 3.7 4.0  Chloride 96 - 106 mmol/L 106 104 103  CO2 20 - 29 mmol/L 26 32 29  Calcium 8.6 - 10.2 mg/dL 9.2 8.6 8.7     Hepatic Function Latest Ref Rng & Units 06/05/2019 04/11/2019 03/16/2019  Total Protein 6.0 - 8.5 g/dL 6.5 6.6 6.1  Albumin 3.7 - 4.7 g/dL 3.7 2.9(L) 2.9(L)  AST 0 - 40 IU/L 37 86(H) 91(H)  ALT 0 - 44 IU/L 19 44 61(H)  Alk Phosphatase 39 - 117 IU/L 109 798(H) 836(H)  Total Bilirubin 0.0 - 1.2 mg/dL 0.7 2.0(H) 2.9(H)    CBC Latest Ref Rng & Units 04/11/2019 03/16/2019 03/09/2019  WBC 4.0 - 10.5 K/uL 5.5 6.9 -  Hemoglobin 13.0 - 17.0 g/dL 12.9(L) 12.4(L) 14.3  Hematocrit 39.0 - 52.0 % 37.9(L) 36.9(L) 42.0  Platelets 150.0 - 400.0 K/uL 211.0 253 -   Lab Results  Component Value Date   MCV 96.1 04/11/2019   MCV 98 (H) 03/16/2019   MCV 99.5 11/07/2018   Lab Results  Component Value Date   TSH 0.643 03/16/2019   Lab Results  Component Value Date   HGBA1C 5.5 11/07/2018     BNP No results found for: BNP  ProBNP    Component Value Date/Time   PROBNP 1,110 (H) 06/05/2019 1125     Lipid Panel  No  results found for: CHOL, TRIG, HDL, CHOLHDL, VLDL, LDLCALC, LDLDIRECT, LABVLDL   RADIOLOGY: No results found.   Additional studies/ records that were reviewed today include:  I reviewed the records of Dr. Agustin Cree.  I have personally obtained a download of his machine from November 09, 2018 through December 08, 2018; April 1 through June 02, 2019 and from Jun 09, 2019 through Jun 16, 2019.   ASSESSMENT:    1. OSA (obstructive sleep apnea)   2. Permanent atrial fibrillation ([place])   3. Weight loss   4. Hyperlipidemia with target LDL less than 70   5. Choledocholithiasis     PLAN:  Mr. Kaleth Koy is a 74 year old gentleman who has a history of permanent atrial fibrillation, prior obesity, who has normal LV function.  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  He has been on CPAP therapy for 19 years commencing in 2002 with an apparent initial sleep study performed in [place].  He currently has a more recent ResMed air sense 10 CPAP auto machine and his DME company is [employer].  Lost over 60 pounds since September 2019 which undoubtedly has been very beneficial with reference to his sleep apnea.  His most recent download show that he is meeting compliance standards.  However, his download from April 1 through June 02, 2019 showed a pressure range set between 12 and 14.  AHI was increased at 8.7 and he did have significant mask leak which undoubtedly may be contributing to his increased AHI.  I discussed with him new mask technology and feel he may benefit from changing to the new ResMed air fit F 30i.  I have recommended changing his CPAP setting to a minimum pressure of 12 with maximum up to 18.  I am also changing his ramp start from 4 to 6 cm and will change his EPR to 3.  His blood pressure today is stable on his current medical regimen of carvedilol 25 mg twice a day furosemide 40 mg daily in addition to amlodipine/benazepril 5/40 daily.  Changes to be on Eliquis for anticoagulation.  He is on atorvastatin for hyperlipidemia.  He did suffer Covid in February/March which has resolved.  He will return to the cardiology care of Dr. Agustin Cree.  I will see him in 1 year for follow-up sleep evaluation.   Medication Adjustments/Labs and Tests Ordered: Current medicines are  reviewed at length with the patient today.  Concerns regarding medicines are outlined above.  Medication changes, Labs and Tests ordered today are listed in the Patient Instructions below. Patient Instructions  Medication Instructions:  CONTINUE WITH CURRENT MEDICATIONS. NO CHANGES.  *If you need a refill on your cardiac medications before your next appointment, please call your pharmacy*   Follow-Up: At [facility], you and your health needs are our priority.  As part of our continuing mission to provide you with exceptional heart care, we have created designated Provider Care Teams.  These Care Teams include your primary Cardiologist (physician) and Advanced Practice Providers (APPs -  Physician Assistants and Nurse Practitioners) who all work together to provide you with the care you need, when you need it.  We recommend signing up for the patient portal called "MyChart".  Sign up information is provided on this After Visit Summary.  MyChart is used to connect with patients for Virtual Visits (Telemedicine).  Patients are able to view lab/test results, encounter notes, upcoming appointments,  etc.  Non-urgent messages can be sent to your provider as well.   To learn more about what you can do with MyChart, go to [email or web address].    Your next appointment:   12 month(s)  The format for your next appointment:   In Person  Provider:   Shelva Majestic, MD        Signed, Shelva Majestic, MD  06/28/2019 6:19 PM    [facility] [address], [place], [place], [place]  [address] Phone: [phone]

## 2019-07-03 DIAGNOSIS — I10 Essential (primary) hypertension: Secondary | ICD-10-CM | POA: Diagnosis not present

## 2019-07-03 DIAGNOSIS — E870 Hyperosmolality and hypernatremia: Secondary | ICD-10-CM | POA: Diagnosis not present

## 2019-07-03 DIAGNOSIS — R7303 Prediabetes: Secondary | ICD-10-CM | POA: Diagnosis not present

## 2019-07-03 DIAGNOSIS — I2510 Atherosclerotic heart disease of native coronary artery without angina pectoris: Secondary | ICD-10-CM | POA: Diagnosis not present

## 2019-07-04 ENCOUNTER — Telehealth: Payer: Self-pay | Admitting: Cardiovascular Disease

## 2019-07-04 NOTE — Telephone Encounter (Signed)
Patient wanted to know if Dr. Claiborne Billings wrote a new Rx for him to get a new mask for his CPAP machine. The patient says he gets a lot of air leakage and the apnea is high. He will need a new mask ASAP

## 2019-07-06 ENCOUNTER — Ambulatory Visit: Payer: Medicare Other | Admitting: Sports Medicine

## 2019-07-07 ENCOUNTER — Ambulatory Visit (INDEPENDENT_AMBULATORY_CARE_PROVIDER_SITE_OTHER): Payer: Medicare Other | Admitting: Sports Medicine

## 2019-07-07 ENCOUNTER — Other Ambulatory Visit: Payer: Self-pay

## 2019-07-07 ENCOUNTER — Encounter: Payer: Self-pay | Admitting: Sports Medicine

## 2019-07-07 DIAGNOSIS — M79674 Pain in right toe(s): Secondary | ICD-10-CM | POA: Diagnosis not present

## 2019-07-07 DIAGNOSIS — E119 Type 2 diabetes mellitus without complications: Secondary | ICD-10-CM

## 2019-07-07 DIAGNOSIS — L600 Ingrowing nail: Secondary | ICD-10-CM | POA: Diagnosis not present

## 2019-07-07 DIAGNOSIS — I739 Peripheral vascular disease, unspecified: Secondary | ICD-10-CM

## 2019-07-07 DIAGNOSIS — Z7901 Long term (current) use of anticoagulants: Secondary | ICD-10-CM

## 2019-07-07 MED ORDER — AMOXICILLIN-POT CLAVULANATE 875-125 MG PO TABS: 1.0000 | ORAL_TABLET | Freq: Two times a day (BID) | ORAL | 0 refills | Status: DC

## 2019-07-07 NOTE — Patient Instructions (Signed)

## 2019-07-07 NOTE — Progress Notes (Signed)
Subjective: Spencer Cochran is a 74 y.o. male patient presents to office today complaining of a moderately painful right first toenail especially at the medial border like previous and is here as scheduled for nail procedure.  Patient reports that his left toe seems to be doing okay a little bit of redness and reports that he did get some dirt at the toenail from doing yard work at the procedure site and has been trying to keep it clean soak and use his drops.  Patient is on Eliquis for cardiac/A. fib with no changes in his medication and reports that he has been experience some swelling currently on his Lasix medication which is helping.  Patient reports that his blood sugar today was not recorded last A1c of 5.6.  No other issues noted.  Patient Active Problem List   Diagnosis Date Noted  . Obstructive sleep apnea 06/05/2019  . Obstructive jaundice   . Choledocholithiasis   . Biliary obstruction   . Gait abnormality 08/03/2018  . Paresthesia 07/18/2018  . Coronary artery disease 10/01/2016  . Dyslipidemia 10/01/2016  . Atrial fibrillation ([place]) 03/08/2014  . Benign prostatic hypertrophy with incomplete bladder emptying 04/20/2013    Current Outpatient Medications on File Prior to Visit  Medication Sig Dispense Refill  . amLODipine-benazepril (LOTREL) 5-40 MG capsule Take 1 capsule by mouth daily. 90 capsule 3  . atorvastatin (LIPITOR) 40 MG tablet Take 40 mg by mouth at bedtime.     . carvedilol (COREG) 25 MG tablet Take 1 tablet (25 mg total) by mouth 2 (two) times daily with a meal. 180 tablet 1  . ELIQUIS 5 MG TABS tablet Take 5 mg by mouth 2 (two) times daily.    . ferrous sulfate 325 (65 FE) MG tablet Take 325 mg by mouth daily.    . furosemide (LASIX) 40 MG tablet TAKE 1 TABLET BY MOUTH EVERY DAY 30 tablet 2  . neomycin-polymyxin-hydrocortisone (CORTISPORIN) OTIC solution Apply 2-3 drops to the ingrown toenail site twice daily. Cover with band-aid. 10 mL 0  . nitroGLYCERIN  (NITROSTAT) 0.4 MG SL tablet Place 0.4 mg under the tongue every 5 (five) minutes x 3 doses as needed for chest pain.     . Omega-3 Fatty Acids (OMEGA 3 PO) Take 520 mg by mouth daily.    Marland Kitchen omeprazole (PRILOSEC) 20 MG capsule Take 20 mg by mouth daily.     . potassium chloride (KLOR-CON) 10 MEQ tablet TAKE 1 TABLET BY MOUTH EVERY DAY 30 tablet 2  . timolol (TIMOPTIC) 0.5 % ophthalmic solution Place 1 drop into both eyes 2 (two) times daily.     Marland Kitchen triamcinolone cream (KENALOG) 0.1 % Apply 1 application topically daily as needed (skin irritation).      No current facility-administered medications on file prior to visit.    Allergies  Allergen Reactions  . Hydrocodone Other (See Comments)    insomnia "awake for 48 hours"    Objective:  There were no vitals filed for this visit.  General: Well developed, nourished, in no acute distress, alert and oriented x3   Dermatology: Skin is warm, dry and supple bilateral.  Right hallux nail appears to be moderately incurvated with hyperkeratosis formation at the distal aspects of  the medial nail border. (+) Erythema. (-) Edema. (-) serosanguous  drainage present. The remaining nails appear unremarkable at this time with no acute symptoms except on the left hallux he is status post previous permanent nail avulsion procedure with a small amount of redness  at the proximal nail fold.  There are no open sores, lesions or other signs of infection  present.  Vascular: Dorsalis Pedis artery 1 out of 4 and Posterior Tibial artery pedal pulses are 0 out of 4 bilateral due to 1+ pitting edema with immedate capillary fill time. Pedal hair growth present.  Neruologic: Grossly intact via light touch bilateral.  Musculoskeletal: Tenderness to palpation of the right greater than left hallux medial nail fold. Muscular strength within normal limits in all groups bilateral.   Assesement and Plan: Problem List Items Addressed This Visit    None    Visit Diagnoses     Ingrown toenail    -  Primary   Toe pain, right       Long term current use of anticoagulant therapy       PVD (peripheral vascular disease) (HCC)       Diabetes mellitus without complication ([place])          -Discussed treatment alternatives and plan of care; Explained permanent/temporary nail avulsion and post procedure course to patient. Advised patient on the risk of doing this nail procedure in the setting of diabetes as well as in the setting of on anticoagulant therapy Patient elects for PNA right hallux medial nail fold - After a verbal and written consent, injected 3 ml of a 50:50 mixture of 2% plain  lidocaine and 0.5% plain marcaine in a normal hallux block fashion. Next, a  betadine prep was performed. Anesthesia was tested and found to be appropriate.  The offending right hallux medial nail border was then incised from the hyponychium to the epinychium. The offending nail border was removed and cleared from the field. The area was curretted for any remaining nail or spicules. Phenol application performed and the area was then flushed with alcohol and dressed with antibiotic cream and a dry sterile dressing. -Patient was instructed to leave the dressing intact for today and begin soaking  in a weak solution of betadine or Epsom salt and water tomorrow. Patient was instructed to  soak for 15-20 minutes each day and apply neosporin/corticosporin and a gauze or bandaid dressing each day. -Patient was instructed to monitor the toe for signs of infection and return to office if toe becomes red, hot or swollen. -Advised Tylenol as needed for pain and also prescribed Augmentin for patient to take for preventative measures especially since there is a little bit of redness on the previous procedure site on the left.  Advised patient to also soak the left and cover with a Band-Aid when in a shoe to prevent rubbing or irritation. -Patient is to return in 2 weeks for follow up care/nail check or  sooner if problems arise.  Landis Martins, DPM

## 2019-07-12 ENCOUNTER — Telehealth: Payer: Self-pay | Admitting: Cardiology

## 2019-07-12 DIAGNOSIS — N401 Enlarged prostate with lower urinary tract symptoms: Secondary | ICD-10-CM | POA: Diagnosis not present

## 2019-07-12 DIAGNOSIS — N200 Calculus of kidney: Secondary | ICD-10-CM | POA: Diagnosis not present

## 2019-07-12 DIAGNOSIS — R3912 Poor urinary stream: Secondary | ICD-10-CM | POA: Diagnosis not present

## 2019-07-12 NOTE — Telephone Encounter (Signed)
Patients pharmacy has been added and is in the system.

## 2019-07-12 NOTE — Telephone Encounter (Signed)
New message   Patient wants to add new pharmacy:  [facility] [address] [place], [place] [address]  [phone]

## 2019-07-13 ENCOUNTER — Telehealth: Payer: Self-pay | Admitting: *Deleted

## 2019-07-13 DIAGNOSIS — E782 Mixed hyperlipidemia: Secondary | ICD-10-CM | POA: Diagnosis not present

## 2019-07-13 DIAGNOSIS — G4733 Obstructive sleep apnea (adult) (pediatric): Secondary | ICD-10-CM

## 2019-07-13 DIAGNOSIS — Z125 Encounter for screening for malignant neoplasm of prostate: Secondary | ICD-10-CM | POA: Diagnosis not present

## 2019-07-13 DIAGNOSIS — R7303 Prediabetes: Secondary | ICD-10-CM | POA: Diagnosis not present

## 2019-07-13 NOTE — Telephone Encounter (Signed)
Order placed for a F30i mask to [facility].

## 2019-07-17 NOTE — Telephone Encounter (Signed)
My chart message sent to pt.

## 2019-07-21 ENCOUNTER — Encounter: Payer: Self-pay | Admitting: Sports Medicine

## 2019-07-21 ENCOUNTER — Ambulatory Visit (INDEPENDENT_AMBULATORY_CARE_PROVIDER_SITE_OTHER): Payer: Medicare Other | Admitting: Sports Medicine

## 2019-07-21 ENCOUNTER — Other Ambulatory Visit: Payer: Self-pay

## 2019-07-21 DIAGNOSIS — I739 Peripheral vascular disease, unspecified: Secondary | ICD-10-CM

## 2019-07-21 DIAGNOSIS — Z7901 Long term (current) use of anticoagulants: Secondary | ICD-10-CM

## 2019-07-21 DIAGNOSIS — E119 Type 2 diabetes mellitus without complications: Secondary | ICD-10-CM

## 2019-07-21 DIAGNOSIS — Z9889 Other specified postprocedural states: Secondary | ICD-10-CM

## 2019-07-21 DIAGNOSIS — L600 Ingrowing nail: Secondary | ICD-10-CM

## 2019-07-21 NOTE — Progress Notes (Signed)
Subjective: Adonys Wildes is a 74 y.o. male patient returns to office today for follow up evaluation after having Right and Left Hallux medial permanent nail avulsion performed on (07-07-19, right). Patient has been soaking using epsom salt and applying topical antibiotic drops covered with bandaid daily. Patient  Reports on the left it seems healed but on the right there is still a little drainage and tender, denies fever/chills/nausea/vomitting/any other related constitutional symptoms at this time.  Reports today he will go see his PCP about swelling.   Patient Active Problem List   Diagnosis Date Noted  . Obstructive sleep apnea 06/05/2019  . Obstructive jaundice   . Choledocholithiasis   . Biliary obstruction   . Gait abnormality 08/03/2018  . Paresthesia 07/18/2018  . Coronary artery disease 10/01/2016  . Dyslipidemia 10/01/2016  . Atrial fibrillation ([place]) 03/08/2014  . Benign prostatic hypertrophy with incomplete bladder emptying 04/20/2013    Current Outpatient Medications on File Prior to Visit  Medication Sig Dispense Refill  . amLODipine-benazepril (LOTREL) 5-40 MG capsule Take 1 capsule by mouth daily. 90 capsule 3  . atorvastatin (LIPITOR) 40 MG tablet Take 40 mg by mouth at bedtime.     . carvedilol (COREG) 25 MG tablet Take 1 tablet (25 mg total) by mouth 2 (two) times daily with a meal. 180 tablet 1  . ELIQUIS 5 MG TABS tablet Take 5 mg by mouth 2 (two) times daily.    . ferrous sulfate 325 (65 FE) MG tablet Take 325 mg by mouth daily.    . furosemide (LASIX) 40 MG tablet TAKE 1 TABLET BY MOUTH EVERY DAY 30 tablet 2  . neomycin-polymyxin-hydrocortisone (CORTISPORIN) OTIC solution Apply 2-3 drops to the ingrown toenail site twice daily. Cover with band-aid. 10 mL 0  . nitroGLYCERIN (NITROSTAT) 0.4 MG SL tablet Place 0.4 mg under the tongue every 5 (five) minutes x 3 doses as needed for chest pain.     . Omega-3 Fatty Acids (OMEGA 3 PO) Take 520 mg by mouth daily.    Marland Kitchen  omeprazole (PRILOSEC) 20 MG capsule Take 20 mg by mouth daily.     . potassium chloride (KLOR-CON) 10 MEQ tablet TAKE 1 TABLET BY MOUTH EVERY DAY 30 tablet 2  . timolol (TIMOPTIC) 0.5 % ophthalmic solution Place 1 drop into both eyes 2 (two) times daily.     Marland Kitchen triamcinolone cream (KENALOG) 0.1 % Apply 1 application topically daily as needed (skin irritation).      No current facility-administered medications on file prior to visit.    Allergies  Allergen Reactions  . Hydrocodone Other (See Comments)    insomnia "awake for 48 hours"    Objective:  General: Well developed, nourished, in no acute distress, alert and oriented x3   Dermatology: Skin is warm, dry and supple bilateral. Right hallux medial nail bed appears to be clean, dry, with mild granular tissue and surrounding eschar/scab. (-) Erythema. (-) Edema. (-) serosanguous drainage present. The left toe is healed. The remaining nails appear unremarkable at this time. There are no other lesions or other signs of infection present.  Neurovascular status: Intact. Edema controlled with compression stockings  Musculoskeletal: Decreased tenderness to palpation of the right and left hallux nail fold(s).   Assesement and Plan: Problem List Items Addressed This Visit    None    Visit Diagnoses    Ingrown toenail    -  Primary   S/P nail surgery       Long term current use of  anticoagulant therapy       PVD (peripheral vascular disease) ([place])       Diabetes mellitus without complication ([place])          -Examined patient  -Cleansed right and left hallux medial nail folds and gently scrubbed with peroxide and q-tip/curetted away eschar at site and applied antibiotic cream covered with bandaid.  -Discussed plan of care with patient. -Patient may d/c soaking, may d/c drops and may leave open to air at night. -Educated patient on long term care after nail surgery. -Patient was instructed to monitor the toe for reoccurrence and signs of  infection; Patient advised to return to office or go to ER if toe becomes red, hot or swollen. -Patient is to return as scheduled for routine diabetic foot care or sooner if problems arise.  Landis Martins, DPM

## 2019-07-28 ENCOUNTER — Telehealth: Payer: Self-pay | Admitting: Cardiology

## 2019-07-28 NOTE — Telephone Encounter (Signed)
    Dr. Raynelle Bring from [facility] urology, would need to discuss with Dr. Agustin Cree for posible change in pt's diuretic due to kidney stone issue. He said to call his cp when his available

## 2019-08-03 DIAGNOSIS — R197 Diarrhea, unspecified: Secondary | ICD-10-CM | POA: Diagnosis not present

## 2019-08-03 DIAGNOSIS — A0472 Enterocolitis due to Clostridium difficile, not specified as recurrent: Secondary | ICD-10-CM | POA: Diagnosis not present

## 2019-08-10 ENCOUNTER — Encounter: Payer: Self-pay | Admitting: Cardiology

## 2019-08-10 ENCOUNTER — Ambulatory Visit (INDEPENDENT_AMBULATORY_CARE_PROVIDER_SITE_OTHER): Payer: Medicare Other | Admitting: Cardiology

## 2019-08-10 ENCOUNTER — Other Ambulatory Visit: Payer: Self-pay

## 2019-08-10 VITALS — BP 120/74 | HR 69 | Ht 70.0 in | Wt 183.8 lb

## 2019-08-10 DIAGNOSIS — G4733 Obstructive sleep apnea (adult) (pediatric): Secondary | ICD-10-CM

## 2019-08-10 DIAGNOSIS — R002 Palpitations: Secondary | ICD-10-CM

## 2019-08-10 DIAGNOSIS — R0600 Dyspnea, unspecified: Secondary | ICD-10-CM

## 2019-08-10 DIAGNOSIS — I5043 Acute on chronic combined systolic (congestive) and diastolic (congestive) heart failure: Secondary | ICD-10-CM

## 2019-08-10 DIAGNOSIS — Z79899 Other long term (current) drug therapy: Secondary | ICD-10-CM

## 2019-08-10 DIAGNOSIS — R0609 Other forms of dyspnea: Secondary | ICD-10-CM

## 2019-08-10 DIAGNOSIS — I4821 Permanent atrial fibrillation: Secondary | ICD-10-CM

## 2019-08-10 DIAGNOSIS — I2510 Atherosclerotic heart disease of native coronary artery without angina pectoris: Secondary | ICD-10-CM

## 2019-08-10 DIAGNOSIS — Z5181 Encounter for therapeutic drug level monitoring: Secondary | ICD-10-CM

## 2019-08-10 DIAGNOSIS — E785 Hyperlipidemia, unspecified: Secondary | ICD-10-CM

## 2019-08-10 HISTORY — DX: Palpitations: R00.2

## 2019-08-10 MED ORDER — BENAZEPRIL HCL 40 MG PO TABS: 40.0000 mg | ORAL_TABLET | Freq: Every day | ORAL | 1 refills | Status: DC

## 2019-08-10 NOTE — Progress Notes (Signed)
Cardiology Office Note:    Date:  08/10/2019   ID:  Spencer Cochran, DOB 04-03-1945, MRN [id number]  PCP:  Spencer Shivers, Cochran  Cardiologist:  Spencer Cochran    Referring Cochran: Spencer Shivers, Cochran   No chief complaint on file. I have some dizziness as well as palpitations  History of Present Illness:    Spencer Cochran is a 74 y.o. male with past medical history significant for remote coronary artery disease, permanent atrial fibrillation, recently he ended up having gallbladder problems and required surgery lost significant amount of weight before it however likely started recovering.  He is back at the gym however he does have numerous complaints he noted swelling of lower extremities especially at evening time.  There is no pain in his legs.  He also described the fact when he gets up very quickly he will get dizzy.  He described what he says quivering in the chest.  It looks like he is complaining of having palpitations.  Those last for few seconds not related to exercise not related to any position of the body.  There is no syncope.  Again he is back at the gym start slowly exercising gradually increase the strength.  Past Medical History:  Diagnosis Date  . Arthritis knees and ankle  . Asymptomatic gallstones   . Cancer ([place])    skin   . Cataract immature BILATERAL EYES  . Coronary artery disease CARDIOLOGIST-  DR Spencer Cochran  Tia Alert)---  LAST VISIT 04-29-2011   DENIES CARDIAC SYMPTOMS  . DDD (degenerative disc disease), lumbar   . Dysrhythmia    A Fib   . Frequency of urination   . GERD (gastroesophageal reflux disease)   . Glaucoma    both eyes  . History of colon polyps   . History of kidney stones   . Hypertension   . 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 Left ventricular diastolic dysfunction PER ECHO 03-31-2011  W/ CHART  . Nocturia   . OSA on CPAP    wears cpap  . Pre-diabetes    last hemaglobin a 1 c was 5.6  .  Unsteadiness   . Urethral stricture     Past Surgical History:  Procedure Laterality Date  . APPENDECTOMY  1972  . BALLOON DILATION N/A 04/13/2019   Procedure: BALLOON DILATION;  Surgeon: Spencer Denmark, Cochran;  Location: [facility];  Service: Endoscopy;  Laterality: N/A;  . CARDIOVASCULAR STRESS TEST  04-29-2010   DR Spencer Cochran   NO EVIDENCE ISCHEMIA/ NORMAL LVSF AND WALL MOTION/ EF 63%  . CERVICAL FUSION  2006   C3 - 6  . COLONOSCOPY  03/23/2012   Small colonic polyps, status post polypectomy. Pancolonic diverticulosis predominantly in the left colon.Small internal and external hemorrhoids  . COLONOSCOPY  2019   Dr Spencer Cochran. Diverticulosis. Doesn't think he removed polpys   . CYSTO/ URETHRAL DILATION/ TRANSURETHRAL INCISIONOF PROSTATE  01-13-2007  . CYSTOSCOPY  2005  . CYSTOSCOPY W/ RETROGRADES  06/12/2011   Procedure: CYSTOSCOPY WITH RETROGRADE PYELOGRAM;  Surgeon: Spencer Rud, Cochran;  Location[facility];  Service: Urology;  Laterality: N/A;  cysto, urethral dilation, right retrograde pyelogram    . CYSTOSCOPY WITH RETROGRADE PYELOGRAM, URETEROSCOPY AND STENT PLACEMENT Left 03/08/2014   Procedure: CYSTOSCOPY WITH LEFT RETROGRADE PYELOGRAM/LEFT  URETEROSCOPY;  Surgeon: Spencer Rud, Cochran;  Location: [facility];  Service: Urology;  Laterality: Left;  . CYSTOSCOPY WITH URETHRAL DILATATION N/A 04/20/2013  Procedure: CYSTOSCOPY WITH URETHRAL DILATATION;  Surgeon: Spencer Rud, Cochran;  Location: [facility];  Service: Urology;  Laterality: N/A;  . CYSTOSCOPY/URETEROSCOPY/HOLMIUM LASER/STENT PLACEMENT Left 06/10/2017   Procedure: CYSTOSCOPY/RETROGRADE/URETEROSCOPY/HOLMIUM LASER/STENT PLACEMENT;  Surgeon: Spencer Bring, Cochran;  Location: [facility];  Service: Urology;  Laterality: Left;  . CYSTOSCOPY/URETEROSCOPY/HOLMIUM LASER/STENT PLACEMENT Right 03/09/2019   Procedure: CYSTOSCOPY/RETROGRADE/URETEROSCOPY/HOLMIUM LASER/STENT PLACEMENT;  Surgeon: Spencer Bring, Cochran;  Location: [facility];  Service: Urology;  Laterality: Right;  ONLY NEEDS 60 MIN  . ENDOSCOPIC RETROGRADE CHOLANGIOPANCREATOGRAPHY (ERCP) WITH PROPOFOL N/A 04/13/2019   Procedure: ENDOSCOPIC RETROGRADE CHOLANGIOPANCREATOGRAPHY (ERCP) WITH PROPOFOL;  Surgeon: Spencer Denmark, Cochran;  Location: [facility];  Service: Endoscopy;  Laterality: N/A;  . EXTRACORPOREAL SHOCK WAVE LITHOTRIPSY  01-17-2007   LEFT  . HERNIA REPAIR  01/30/2019   [facility] Left inguinal hernia repair  . HOLMIUM LASER APPLICATION Left 04/04/2023   Procedure: LASER OF LEFT RENAL PELVIC STONE;  Surgeon: Spencer Rud, Cochran;  Location: [facility];  Service: Urology;  Laterality: Left;  . INGUINAL HERNIA REPAIR  2003  . JOINT REPLACEMENT     total knee right 03-01-17  . LEFT ACHILLES TENDON REPAIR  1992  . MASS EXCISION N/A 11/14/2018   Procedure: EXCISIONAL BIOPSY OF GLANS PENIS;  Surgeon: Spencer Bring, Cochran;  Location: [facility];  Service: Urology;  Laterality: N/A;  ONLY NEEDS 60 MIN  . NASAL SINUS SURGERY  2012  . PENILE SURGERY  OF MEATUS  1955  . REMOVAL OF STONES  04/13/2019   Procedure: REMOVAL OF STONES;  Surgeon: Spencer Denmark, Cochran;  Location: [facility];  Service: Endoscopy;;  . Spencer Cochran  04/13/2019   Procedure: Spencer Cochran;  Surgeon: Spencer Denmark, Cochran;  Location: [facility];  Service: Endoscopy;;  . TRANSTHORACIC ECHOCARDIOGRAM  03-31-2011   NORMAL LVEF  59%/ TRIVIAL MR/ DIASTOLIC DYSFUNCTION/ MODERATE LVH  . TRANSURETHRAL RESECTION OF PROSTATE N/A 04/20/2013   Procedure: TRANSURETHRAL RESECTION OF THE PROSTATE WITH GYRUS INSTRUMENTS;  Surgeon: Spencer Rud, Cochran;  Location: [facility];  Service: Urology;  Laterality: N/A;    Current Medications: Current Meds  Medication Sig  . atorvastatin (LIPITOR) 40 MG tablet Take 40 mg by mouth at bedtime.   . carvedilol (COREG) 25 MG tablet Take 1 tablet (25 mg total) by mouth 2 (two) times daily with a meal.  . ELIQUIS 5 MG TABS tablet Take 5 mg by mouth 2 (two)  times daily.  . ferrous sulfate 325 (65 FE) MG tablet Take 325 mg by mouth daily.  . furosemide (LASIX) 40 MG tablet TAKE 1 TABLET BY MOUTH EVERY DAY  . nitroGLYCERIN (NITROSTAT) 0.4 MG SL tablet Place 0.4 mg under the tongue every 5 (five) minutes x 3 doses as needed for chest pain.   . Omega-3 Fatty Acids (OMEGA 3 PO) Take 520 mg by mouth daily.  Marland Kitchen omeprazole (PRILOSEC) 20 MG capsule Take 20 mg by mouth daily.   . potassium chloride (KLOR-CON) 10 MEQ tablet TAKE 1 TABLET BY MOUTH EVERY DAY  . Probiotic Product (PROBIOTIC DAILY PO) Take 1 tablet by mouth daily.  . timolol (TIMOPTIC) 0.5 % ophthalmic solution Place 1 drop into both eyes 2 (two) times daily.   Marland Kitchen triamcinolone cream (KENALOG) 0.1 % Apply 1 application topically daily as needed (skin irritation).   . [DISCONTINUED] amLODipine-benazepril (LOTREL) 5-40 MG capsule Take 1 capsule by mouth daily.     Allergies:   Hydrocodone   Social History   Socioeconomic History  . Marital status: Married  Spouse name: Not on file  . Number of children: 2  . Years of education: some college  . Highest education level: Not on file  Occupational History  . Occupation: retired Social research officer, government  Tobacco Use  . Smoking status: Never Smoker  . Smokeless tobacco: Never Used  Vaping Use  . Vaping Use: Never used  Substance and Sexual Activity  . Alcohol use: No  . Drug use: No  . Sexual activity: Yes  Other Topics Concern  . Not on file  Social History Narrative   Lives at home with his wife.   Right-handed.   Caffeine use:  32-40 ounces caffeine per day.   Social Determinants of Health   Financial Resource Strain:   . Difficulty of Paying Living Expenses:   Food Insecurity:   . Worried About Charity fundraiser in the Last Year:   . Arboriculturist in the Last Year:   Transportation Needs:   . Film/video editor (Medical):   Marland Kitchen Lack of Transportation (Non-Medical):   Physical Activity:   . Days of Exercise per Week:   . Minutes  of Exercise per Session:   Stress:   . Feeling of Stress :   Social Connections:   . Frequency of Communication with Friends and Family:   . Frequency of Social Gatherings with Friends and Family:   . Attends Religious Services:   . Active Member of Clubs or Organizations:   . Attends Archivist Meetings:   Marland Kitchen Marital Status:      Family History: The patient's family history includes Breast cancer in his mother; Cirrhosis in his father; Colon cancer in his maternal aunt. There is no history of Esophageal cancer. ROS:   Please see the history of present illness.    All 14 point review of systems negative except as described per history of present illness  EKGs/Labs/Other Studies Reviewed:      Recent Labs: 03/16/2019: TSH 0.643 04/11/2019: Hemoglobin 12.9; Platelets 211.0 06/05/2019: ALT 19; BUN 14; Creatinine, Ser 0.74; NT-Pro BNP 1,110; Potassium 4.1; Sodium 144  Recent Lipid Panel No results found for: CHOL, TRIG, HDL, CHOLHDL, VLDL, LDLCALC, LDLDIRECT  Physical Exam:    VS:  BP 120/74 (BP Location: Right Arm, Patient Position: Sitting, Cuff Size: Normal)   Pulse 69   Ht 5\' 10"  (1.778 m)   Wt 183 lb 12.8 oz (83.4 kg)   SpO2 95%   BMI 26.37 kg/m     Wt Readings from Last 3 Encounters:  08/10/19 183 lb 12.8 oz (83.4 kg)  06/21/19 185 lb (83.9 kg)  06/05/19 181 lb 3.2 oz (82.2 kg)     GEN:  Well nourished, well developed in no acute distress HEENT: Normal NECK: No JVD; No carotid bruits LYMPHATICS: No lymphadenopathy CARDIAC: Irregularly irregular, no murmurs, no rubs, no gallops RESPIRATORY:  Clear to auscultation without rales, wheezing or rhonchi  ABDOMEN: Soft, non-tender, non-distended MUSCULOSKELETAL:  No edema; No deformity  SKIN: Warm and dry LOWER EXTREMITIES: no swelling NEUROLOGIC:  Alert and oriented x 3 PSYCHIATRIC:  Normal affect   ASSESSMENT:    1. DOE (dyspnea on exertion)   2. Palpitations   3. Coronary artery disease involving native  coronary artery of native heart without angina pectoris   4. Permanent atrial fibrillation ([place])   5. Obstructive sleep apnea   6. Dyslipidemia    PLAN:    In order of problems listed above:  1. Palpitations: I will ask him to have  Zio patch to see exactly what kind of arrhythmia if any we dealing with. 2. Dyspnea on exertion I will check proBNP as well as Chem-7.  Echocardiogram will be repeated as well. 3. Coronary artery disease does not have any symptomatology directly suggesting involvement of this process but will continue following. 4. Permanent atrial fibrillation: Anticoagulated, rate controlled.  I will put monitor to see if there is any evidence of significant different kind of arrhythmia. 5. Dizziness upon getting up I suspect orthostatic hypotension play significant role here.  I will discontinue his Lotrel and will put him only on benazepril 40 mg daily.  By doing this I will eliminate amlodipine which should help with the swelling of lower extremities as well as with orthostatic hypotension   Medication Adjustments/Labs and Tests Ordered: Current medicines are reviewed at length with the patient today.  Concerns regarding medicines are outlined above.  Orders Placed This Encounter  Procedures  . Basic metabolic panel  . Pro b natriuretic peptide (BNP)  . LONG TERM MONITOR (3-14 DAYS)  . EKG 12-Lead   Medication changes:  Meds ordered this encounter  Medications  . benazepril (LOTENSIN) 40 MG tablet    Sig: Take 1 tablet (40 mg total) by mouth daily.    Dispense:  90 tablet    Refill:  1    Signed, Park Liter, Cochran, [facility] 08/10/2019 5:14 PM    [facility]

## 2019-08-10 NOTE — Patient Instructions (Signed)
Medication Instructions:  Your physician has recommended you make the following change in your medication:  STOP: Lotrel   START: Benazapril 40 mg daily   *If you need a refill on your cardiac medications before your next appointment, please call your pharmacy*   Lab Work: Your physician recommends that you return for lab work today: bmp, pro bnp   If you have labs (blood work) drawn today and your tests are completely normal, you will receive your results only by: Marland Kitchen MyChart Message (if you have MyChart) OR . A paper copy in the mail If you have any lab test that is abnormal or we need to change your treatment, we will call you to review the results.   Testing/Procedures: A zio monitor was ordered today. It will remain on for 14 days. You will then return monitor and event diary in provided box. It takes 1-2 weeks for report to be downloaded and returned to [place]. We will call you with the results. If monitor falls off or has orange flashing light, please call Zio for further instructions.      Follow-Up: At [facility], you and your health needs are our priority.  As part of our continuing mission to provide you with exceptional heart care, we have created designated Provider Care Teams.  These Care Teams include your primary Cardiologist (physician) and Advanced Practice Providers (APPs -  Physician Assistants and Nurse Practitioners) who all work together to provide you with the care you need, when you need it.  We recommend signing up for the patient portal called "MyChart".  Sign up information is provided on this After Visit Summary.  MyChart is used to connect with patients for Virtual Visits (Telemedicine).  Patients are able to view lab/test results, encounter notes, upcoming appointments, etc.  Non-urgent messages can be sent to your provider as well.   To learn more about what you can do with MyChart, go to [email or web address].    Your next appointment:   6  week(s)  The format for your next appointment:   In Person  Provider:   Jenne Campus, MD   Other Instructions  Benazepril Tablets What is this medicine? BENAZEPRIL (ben AY ze pril) is an ACE inhibitor. It treats high blood pressure. This medicine may be used for other purposes; ask your health care provider or pharmacist if you have questions. COMMON BRAND NAME(S): Lotensin What should I tell my health care provider before I take this medicine? They need to know if you have any of these conditions:  bone marrow disease  heart or blood vessel disease  if you are on a special diet, such as a low salt diet  immune system disease like lupus  kidney or liver disease  low blood pressure  previous swelling of the tongue, face, or lips with difficulty breathing, difficulty swallowing, hoarseness, or tightening of the throat  an unusual or allergic reaction to benazepril, other ACE inhibitors, insect venom, foods, dyes, or preservatives  pregnant or trying to get pregnant  breast-feeding How should I use this medicine? Take this drug by mouth. Take it as directed on the prescription label at the same time every day. You can take it with or without food. If it upsets your stomach, take it with food. Keep taking it unless your health care provider tells you to stop. Talk to your health care provider about the use of this drug in children. While it may be prescribed for children as young as 6 for selected  conditions, precautions do apply. Overdosage: If you think you have taken too much of this medicine contact a poison control center or emergency room at once. NOTE: This medicine is only for you. Do not share this medicine with others. What if I miss a dose? If you miss a dose, take it as soon as you can. If it is almost time for your next dose, take only that dose. Do not take double or extra doses. What may interact with this medicine? Do not take this medication with any of  the following medications:  sacubitril; valsartan This medicine may also interact with the following:  diuretics  everolimus  lithium  medicines for high blood pressure  NSAIDs, medicines for pain and inflammation, like ibuprofen or naproxen  potassium salts or potassium supplements  sirolimus  temsirolimus This list may not describe all possible interactions. Give your health care provider a list of all the medicines, herbs, non-prescription drugs, or dietary supplements you use. Also tell them if you smoke, drink alcohol, or use illegal drugs. Some items may interact with your medicine. What should I watch for while using this medicine? Visit your doctor or health care professional for regular checks on your progress. Check your blood pressure as directed. Ask your doctor or health care professional what your blood pressure should be and when you should contact him or her. Call your doctor or health care professional if you notice an irregular or fast heart beat. Women should inform their doctor if they wish to become pregnant or think they might be pregnant. There is a potential for serious side effects to an unborn child. Talk to your health care professional or pharmacist for more information. Check with your doctor or health care professional if you get an attack of severe diarrhea, nausea and vomiting, or if you sweat a lot. The loss of too much body fluid can make it dangerous for you to take this medicine. You may get drowsy or dizzy. Do not drive, use machinery, or do anything that needs mental alertness until you know how this drug affects you. Do not stand or sit up quickly, especially if you are an older patient. This reduces the risk of dizzy or fainting spells. Alcohol can make you more drowsy and dizzy. Avoid alcoholic drinks. Avoid salt substitutes unless you are told otherwise by your doctor or health care professional. Do not treat yourself for coughs, colds, or pain  while you are taking this medicine without asking your doctor or health care professional for advice. Some ingredients may increase your blood pressure. What side effects may I notice from receiving this medicine? Side effects that you should report to your doctor or health care professional as soon as possible:  allergic reactions like skin rash, itching or hives, swelling of the face, lips, or tongue  breathing problems  fast, irregular heartbeat  feeling faint or lightheaded, falls  problems swallowing  redness, blistering, peeling or loosening of the skin, including inside the mouth  swelling of ankles, legs  trouble passing urine or change in the amount of urine Side effects that usually do not require medical attention (report to your doctor or health care professional if they continue or are bothersome):  cough  headache  nausea  sun sensitivity  tiredness This list may not describe all possible side effects. Call your doctor for medical advice about side effects. You may report side effects to FDA at 1-800-FDA-1088. Where should I keep my medicine? Keep out of the  reach of children and pets. Store at room temperature below 30 degrees C (86 degrees F). Protect from moisture. Keep the container tightly closed. Throw away any unused drug after the expiration date. NOTE: This sheet is a summary. It may not cover all possible information. If you have questions about this medicine, talk to your doctor, pharmacist, or health care provider.  2020 Elsevier/Gold Standard (2018-08-22 15:37:52)

## 2019-08-11 ENCOUNTER — Other Ambulatory Visit: Payer: Self-pay | Admitting: Cardiology

## 2019-08-11 ENCOUNTER — Telehealth: Payer: Self-pay | Admitting: Cardiovascular Disease

## 2019-08-11 LAB — BASIC METABOLIC PANEL
BUN/Creatinine Ratio: 19 (ref 10–24)
BUN: 15 mg/dL (ref 8–27)
CO2: 29 mmol/L (ref 20–29)
Calcium: 9.2 mg/dL (ref 8.6–10.2)
Chloride: 104 mmol/L (ref 96–106)
Creatinine, Ser: 0.8 mg/dL (ref 0.76–1.27)
GFR calc Af Amer: 102 mL/min/{1.73_m2} (ref >59)
GFR calc non Af Amer: 88 mL/min/{1.73_m2} (ref >59)
Glucose: 86 mg/dL (ref 65–99)
Potassium: 4.8 mmol/L (ref 3.5–5.2)
Sodium: 143 mmol/L (ref 134–144)

## 2019-08-11 LAB — PRO B NATRIURETIC PEPTIDE: NT-Pro BNP: 1606 pg/mL — ABNORMAL HIGH (ref 0–376)

## 2019-08-11 MED ORDER — APIXABAN 5 MG PO TABS: ORAL_TABLET | ORAL | 3 refills | Status: DC

## 2019-08-11 NOTE — Telephone Encounter (Signed)
Refill sent in per request.  

## 2019-08-11 NOTE — Telephone Encounter (Signed)
Routed to sleep coordinator to assist with orders  Per OV note: I discussed with him new mask technology and feel he may benefit from changing to the new ResMed air fit F 30i.  I have recommended changing his CPAP setting to a minimum pressure of 12 with maximum up to 18.  I am also changing his ramp start from 4 to 6 cm and will change his EPR to 3.

## 2019-08-11 NOTE — Telephone Encounter (Signed)
73 M 83.4 kg ,SCr 0.8 (7/21) LOV Agustin Cree 7/21

## 2019-08-11 NOTE — Telephone Encounter (Signed)
*  STAT* If patient is at the pharmacy, call can be transferred to refill team.   1. Which medications need to be refilled? (please list name of each medication and dose if known) ELIQUIS 5 MG TABS tablet  2. Which pharmacy/location (including street and city if local pharmacy) is medication to be sent to? [place], [place]  3. Do they need a 30 day or 90 day supply? 90 day with 3 refills.

## 2019-08-11 NOTE — Telephone Encounter (Signed)
Pt came to see Dr. Claiborne Billings 06/21/19 and was told to get a new cpap mask. The patient spoke with [facility] where he gets his supplies and [facility] does not have any new orders for the patient.

## 2019-08-14 ENCOUNTER — Telehealth: Payer: Self-pay | Admitting: Cardiology

## 2019-08-14 ENCOUNTER — Other Ambulatory Visit: Payer: Self-pay | Admitting: Cardiology

## 2019-08-14 NOTE — Telephone Encounter (Signed)
[place] from [facility] is calling to get clarification on 2 of the patients medications. [place] stated the pharmacy received the refill on 5/14, for benazepril (LOTENSIN) 40 MG tablet ordered by Dr. Agustin Cree or a benazepril + amlodipine combo ordered by Dr. Harriet Masson. Please call to advise.

## 2019-08-14 NOTE — Telephone Encounter (Signed)
Called pharmacy and spoke to them to inform he is to take benazepril 40 mg daily. The benazepril+amlodipine was discontinued. No further questions.

## 2019-08-15 ENCOUNTER — Ambulatory Visit (INDEPENDENT_AMBULATORY_CARE_PROVIDER_SITE_OTHER): Payer: Medicare Other

## 2019-08-15 ENCOUNTER — Telehealth: Payer: Self-pay | Admitting: Cardiology

## 2019-08-15 DIAGNOSIS — R002 Palpitations: Secondary | ICD-10-CM

## 2019-08-15 DIAGNOSIS — R0600 Dyspnea, unspecified: Secondary | ICD-10-CM

## 2019-08-15 DIAGNOSIS — Z79899 Other long term (current) drug therapy: Secondary | ICD-10-CM

## 2019-08-15 DIAGNOSIS — R0609 Other forms of dyspnea: Secondary | ICD-10-CM

## 2019-08-15 NOTE — Telephone Encounter (Signed)
Dr. Alinda Money is calling requesting to speak with Dr. Agustin Cree in regards to care for the patient. He has left his personal cell for a callback number. Please advise.

## 2019-08-16 NOTE — Addendum Note (Signed)
Addended by: Ashok Norris on: 08/16/2019 10:59 AM   Modules accepted: Orders

## 2019-08-16 NOTE — Telephone Encounter (Signed)
What number do I call?

## 2019-08-17 MED ORDER — HYDROCHLOROTHIAZIDE 25 MG PO TABS: 25.0000 mg | ORAL_TABLET | Freq: Every day | ORAL | 1 refills | Status: DC

## 2019-08-17 NOTE — Telephone Encounter (Signed)
Left message for patient to return call.

## 2019-08-17 NOTE — Telephone Encounter (Signed)
Called patient informed him per Dr. Agustin Cree to stop lasix and start hydrochlorothiazide 25 mg daily. Also informed patient to have lab work done after one week of the new medication.

## 2019-08-17 NOTE — Telephone Encounter (Signed)
I spoke to Dr. Noralee Chars regarding this patient.  Request was tried to cut down his furosemide and replace this in possible with hydrochlorothiazide.  Therefore, we will discontinue his furosemide I will start him with hydrochlorothiazide 25 1 daily.  Chem-7 need to be checked within next week

## 2019-08-22 NOTE — Addendum Note (Signed)
Addended by: Truddie Hidden on: 08/22/2019 10:18 AM   Modules accepted: Orders

## 2019-09-03 DIAGNOSIS — R7303 Prediabetes: Secondary | ICD-10-CM | POA: Diagnosis not present

## 2019-09-03 DIAGNOSIS — I10 Essential (primary) hypertension: Secondary | ICD-10-CM | POA: Diagnosis not present

## 2019-09-03 DIAGNOSIS — I2510 Atherosclerotic heart disease of native coronary artery without angina pectoris: Secondary | ICD-10-CM | POA: Diagnosis not present

## 2019-09-03 DIAGNOSIS — E782 Mixed hyperlipidemia: Secondary | ICD-10-CM | POA: Diagnosis not present

## 2019-09-05 ENCOUNTER — Other Ambulatory Visit: Payer: Self-pay

## 2019-09-05 ENCOUNTER — Ambulatory Visit (INDEPENDENT_AMBULATORY_CARE_PROVIDER_SITE_OTHER): Payer: Medicare Other

## 2019-09-05 DIAGNOSIS — R0600 Dyspnea, unspecified: Secondary | ICD-10-CM | POA: Diagnosis not present

## 2019-09-05 DIAGNOSIS — R0609 Other forms of dyspnea: Secondary | ICD-10-CM

## 2019-09-05 LAB — ECHOCARDIOGRAM COMPLETE
AR max vel: 1.71 cm2
AV Area VTI: 1.62 cm2
AV Area mean vel: 1.89 cm2
AV Mean grad: 10 mmHg
AV Peak grad: 16.5 mmHg
Ao pk vel: 2.03 m/s
Area-P 1/2: 5.27 cm2
P 1/2 time: 418 msec
S' Lateral: 3.1 cm

## 2019-09-05 NOTE — Progress Notes (Signed)
Complete echocardiogram performed.  Jimmy Sultan Pargas RDCS, RVT  

## 2019-09-06 ENCOUNTER — Ambulatory Visit: Payer: Medicare Other | Admitting: Cardiology

## 2019-09-12 DIAGNOSIS — L570 Actinic keratosis: Secondary | ICD-10-CM | POA: Diagnosis not present

## 2019-09-12 DIAGNOSIS — L821 Other seborrheic keratosis: Secondary | ICD-10-CM | POA: Diagnosis not present

## 2019-09-12 DIAGNOSIS — L578 Other skin changes due to chronic exposure to nonionizing radiation: Secondary | ICD-10-CM | POA: Diagnosis not present

## 2019-09-19 ENCOUNTER — Telehealth: Payer: Self-pay | Admitting: Cardiology

## 2019-09-19 NOTE — Telephone Encounter (Signed)
Spoke to the patient just now and he let me know that he needs an order from Dr. Claiborne Billings for the "F30I mask". He states that he is just wanting to let Dr. Rejeana Brock know what was going on.   He states that a message has already been sent to [place] who is getting this all set up for him.

## 2019-09-19 NOTE — Telephone Encounter (Signed)
New message:    Patient calling need some to call him concering Lin care. Please call patient back.

## 2019-09-19 NOTE — Telephone Encounter (Signed)
Patient calling to follow up. He states [facility] has still not received anything.

## 2019-09-22 ENCOUNTER — Ambulatory Visit (INDEPENDENT_AMBULATORY_CARE_PROVIDER_SITE_OTHER): Payer: Medicare Other | Admitting: Sports Medicine

## 2019-09-22 ENCOUNTER — Encounter: Payer: Self-pay | Admitting: Cardiology

## 2019-09-22 ENCOUNTER — Other Ambulatory Visit: Payer: Self-pay

## 2019-09-22 ENCOUNTER — Encounter: Payer: Self-pay | Admitting: Sports Medicine

## 2019-09-22 ENCOUNTER — Ambulatory Visit (INDEPENDENT_AMBULATORY_CARE_PROVIDER_SITE_OTHER): Payer: Medicare Other | Admitting: Cardiology

## 2019-09-22 VITALS — BP 124/70 | HR 86 | Ht 69.0 in | Wt 184.6 lb

## 2019-09-22 DIAGNOSIS — I2510 Atherosclerotic heart disease of native coronary artery without angina pectoris: Secondary | ICD-10-CM | POA: Diagnosis not present

## 2019-09-22 DIAGNOSIS — E119 Type 2 diabetes mellitus without complications: Secondary | ICD-10-CM | POA: Diagnosis not present

## 2019-09-22 DIAGNOSIS — I4821 Permanent atrial fibrillation: Secondary | ICD-10-CM | POA: Diagnosis not present

## 2019-09-22 DIAGNOSIS — E785 Hyperlipidemia, unspecified: Secondary | ICD-10-CM | POA: Diagnosis not present

## 2019-09-22 DIAGNOSIS — M79674 Pain in right toe(s): Secondary | ICD-10-CM

## 2019-09-22 DIAGNOSIS — M79675 Pain in left toe(s): Secondary | ICD-10-CM | POA: Diagnosis not present

## 2019-09-22 DIAGNOSIS — B351 Tinea unguium: Secondary | ICD-10-CM | POA: Diagnosis not present

## 2019-09-22 DIAGNOSIS — R0600 Dyspnea, unspecified: Secondary | ICD-10-CM

## 2019-09-22 DIAGNOSIS — R269 Unspecified abnormalities of gait and mobility: Secondary | ICD-10-CM

## 2019-09-22 DIAGNOSIS — G4733 Obstructive sleep apnea (adult) (pediatric): Secondary | ICD-10-CM

## 2019-09-22 DIAGNOSIS — R0609 Other forms of dyspnea: Secondary | ICD-10-CM

## 2019-09-22 NOTE — Patient Instructions (Signed)
Medication Instructions:  Your physician recommends that you continue on your current medications as directed. Please refer to the Current Medication list given to you today.  *If you need a refill on your cardiac medications before your next appointment, please call your pharmacy*   Lab Work: None.  If you have labs (blood work) drawn today and your tests are completely normal, you will receive your results only by: Marland Kitchen MyChart Message (if you have MyChart) OR . A paper copy in the mail If you have any lab test that is abnormal or we need to change your treatment, we will call you to review the results.   Testing/Procedures: You will have a amyloidosis scan. Please do not wear any lotion or cologne.    Follow-Up: At [facility], you and your health needs are our priority.  As part of our continuing mission to provide you with exceptional heart care, we have created designated Provider Care Teams.  These Care Teams include your primary Cardiologist (physician) and Advanced Practice Providers (APPs -  Physician Assistants and Nurse Practitioners) who all work together to provide you with the care you need, when you need it.  We recommend signing up for the patient portal called "MyChart".  Sign up information is provided on this After Visit Summary.  MyChart is used to connect with patients for Virtual Visits (Telemedicine).  Patients are able to view lab/test results, encounter notes, upcoming appointments, etc.  Non-urgent messages can be sent to your provider as well.   To learn more about what you can do with MyChart, go to [email or web address].    Your next appointment:   3 month(s)  The format for your next appointment:   In Person  Provider:   Jenne Campus, MD   Other Instructions

## 2019-09-22 NOTE — Progress Notes (Signed)
Subjective: Gawain Crombie is a 74 y.o. male patient seen today in office with complaint of mildly painful thickened and elongated toenails; unable to trim. Patient is still on Eliquis blood thinner for history of Cardiac/ A Fib like before with no changes reports that he will see his cardiologist today at 11 AM.  FBS not recorded diet controlled, exercises 3 times per week.  No other issues noted.  Patient Active Problem List   Diagnosis Date Noted  . Palpitations 08/10/2019  . Obstructive sleep apnea 06/05/2019  . Obstructive jaundice   . Choledocholithiasis   . Biliary obstruction   . Gait abnormality 08/03/2018  . Paresthesia 07/18/2018  . Coronary artery disease 10/01/2016  . Dyslipidemia 10/01/2016  . Atrial fibrillation ([place]) 03/08/2014  . Benign prostatic hypertrophy with incomplete bladder emptying 04/20/2013    Current Outpatient Medications on File Prior to Visit  Medication Sig Dispense Refill  . apixaban (ELIQUIS) 5 MG TABS tablet Take 1 tablet (5 mg total) by mouth 2 (two) times daily. 180 tablet 3  . atorvastatin (LIPITOR) 40 MG tablet Take 40 mg by mouth at bedtime.     . benazepril (LOTENSIN) 40 MG tablet Take 1 tablet (40 mg total) by mouth daily. 90 tablet 1  . carvedilol (COREG) 25 MG tablet Take 1 tablet (25 mg total) by mouth 2 (two) times daily with a meal. 180 tablet 1  . ferrous sulfate 325 (65 FE) MG tablet Take 325 mg by mouth daily.    . hydrochlorothiazide (HYDRODIURIL) 25 MG tablet Take 1 tablet (25 mg total) by mouth daily. 90 tablet 1  . nitroGLYCERIN (NITROSTAT) 0.4 MG SL tablet Place 0.4 mg under the tongue every 5 (five) minutes x 3 doses as needed for chest pain.     . Omega-3 Fatty Acids (OMEGA 3 PO) Take 520 mg by mouth daily.    Marland Kitchen omeprazole (PRILOSEC) 20 MG capsule Take 20 mg by mouth daily.     . potassium chloride (KLOR-CON) 10 MEQ tablet TAKE 1 TABLET BY MOUTH EVERY DAY 90 tablet 3  . Probiotic Product (PROBIOTIC DAILY PO) Take 1 tablet by  mouth daily.    . timolol (TIMOPTIC) 0.5 % ophthalmic solution Place 1 drop into both eyes 2 (two) times daily.     Marland Kitchen triamcinolone cream (KENALOG) 0.1 % Apply 1 application topically daily as needed (skin irritation).      No current facility-administered medications on file prior to visit.    Allergies  Allergen Reactions  . Hydrocodone Other (See Comments)    insomnia "awake for 48 hours"    Objective: Physical Exam  General: Well developed, nourished, no acute distress, awake, alert and oriented x 3  Vascular: Dorsalis pedis artery 1/4 bilateral, Posterior tibial artery 0/4 bilateral due to trace edema at ankles bilateral, skin temperature warm to warm proximal to distal bilateral lower extremities, mild varicosities, scant pedal hair present bilateral.  Neurological: Gross sensation present via light touch bilateral.   Dermatological: Skin is warm, dry, and supple bilateral, Nails 1-10 are tender, long, thick, and discolored with mild subungal debris, previous ingrown procedures nail sites well-healed, no open lesions present bilateral, no callus/corns/hyperkeratotic tissue present bilateral. No signs of infection bilateral.  Musculoskeletal: Pes planus deformities noted bilateral. Muscular strength within normal limits without painon range of motion. No pain with calf compression bilateral.  Assessment and Plan:  Problem List Items Addressed This Visit    None    Visit Diagnoses    Pain due to  onychomycosis of toenails of both feet    -  Primary   Diabetes mellitus without complication ([place])         -Examined patient.  -Re-Discussed treatment options for painful mycotic nails. -Mechanically debrided and reduced mycotic nails with sterile nail nipper and dremel nail file without incident -Advised patient with me nail trimming may use a nail file -Continue with good supportive shoes daily for foot type -Continue with exercise -Patient to return as needed nail care or  sooner if symptoms worsen.  Landis Martins, DPM

## 2019-09-22 NOTE — Progress Notes (Signed)
Cardiology Office Note:    Date:  09/22/2019   ID:  Spencer Cochran, DOB Feb 15, 1945, MRN [id number]  PCP:  Maryella Shivers, MD  Cardiologist:  Jenne Campus, MD    Referring MD: Maryella Shivers, MD   No chief complaint on file. I am doing better  History of Present Illness:    Spencer Cochran is a 74 y.o. male with past medical history significant for remote coronary artery disease, permanent atrial fibrillation, recently he ended up having gallbladder problem requiring surgery lost significant amount of weight slowly gradually getting better what brought him today to my office is presence of dizziness and unsteady gait.  He said when he walk he will walk like a drunk.  At the same time he started going to the gym and have no difficulty exercising gradually building up his stamina and strength.  Did also have swelling of lower extremities.  Treated with small dose of diuretic.  Shortness of breath is improving.  No passing out spells.  Past Medical History:  Diagnosis Date  . Arthritis knees and ankle  . Asymptomatic gallstones   . Cancer ([place])    skin   . Cataract immature BILATERAL EYES  . Coronary artery disease CARDIOLOGIST-  DR Agustin Cree  Tia Alert)---  LAST VISIT 04-29-2011   DENIES CARDIAC SYMPTOMS  . DDD (degenerative disc disease), lumbar   . Dysrhythmia    A Fib   . Frequency of urination   . GERD (gastroesophageal reflux disease)   . Glaucoma    both eyes  . History of colon polyps   . History of kidney stones   . Hypertension   . 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 Left ventricular diastolic dysfunction PER ECHO 03-31-2011  W/ CHART  . Nocturia   . OSA on CPAP    wears cpap  . Pre-diabetes    last hemaglobin a 1 c was 5.6  . Unsteadiness   . Urethral stricture     Past Surgical History:  Procedure Laterality Date  . APPENDECTOMY  1972  . BALLOON DILATION N/A 04/13/2019   Procedure: BALLOON DILATION;  Surgeon:  Jackquline Denmark, MD;  Location: [facility];  Service: Endoscopy;  Laterality: N/A;  . CARDIOVASCULAR STRESS TEST  04-29-2010   DR Halford Chessman   NO EVIDENCE ISCHEMIA/ NORMAL LVSF AND WALL MOTION/ EF 63%  . CERVICAL FUSION  2006   C3 - 6  . COLONOSCOPY  03/23/2012   Small colonic polyps, status post polypectomy. Pancolonic diverticulosis predominantly in the left colon.Small internal and external hemorrhoids  . COLONOSCOPY  2019   Dr Orlena Sheldon. Diverticulosis. Doesn't think he removed polpys   . CYSTO/ URETHRAL DILATION/ TRANSURETHRAL INCISIONOF PROSTATE  01-13-2007  . CYSTOSCOPY  2005  . CYSTOSCOPY W/ RETROGRADES  06/12/2011   Procedure: CYSTOSCOPY WITH RETROGRADE PYELOGRAM;  Surgeon: Ailene Rud, MD;  Location[facility];  Service: Urology;  Laterality: N/A;  cysto, urethral dilation, right retrograde pyelogram    . CYSTOSCOPY WITH RETROGRADE PYELOGRAM, URETEROSCOPY AND STENT PLACEMENT Left 03/08/2014   Procedure: CYSTOSCOPY WITH LEFT RETROGRADE PYELOGRAM/LEFT  URETEROSCOPY;  Surgeon: Ailene Rud, MD;  [facility];  Service: Urology;  Laterality: Left;  . CYSTOSCOPY WITH URETHRAL DILATATION N/A 04/20/2013   Procedure: CYSTOSCOPY WITH URETHRAL DILATATION;  Surgeon: Ailene Rud, MD;  [facility];  Service: Urology;  Laterality: N/A;  . CYSTOSCOPY/URETEROSCOPY/HOLMIUM LASER/STENT PLACEMENT Left 06/10/2017   Procedure: CYSTOSCOPY/RETROGRADE/URETEROSCOPY/HOLMIUM LASER/STENT PLACEMENT;  Surgeon: Alinda Money,  Wynetta Emery, MD;  [facility];  Service: Urology;  Laterality: Left;  . CYSTOSCOPY/URETEROSCOPY/HOLMIUM LASER/STENT PLACEMENT Right 03/09/2019   Procedure: CYSTOSCOPY/RETROGRADE/URETEROSCOPY/HOLMIUM LASER/STENT PLACEMENT;  Surgeon: Raynelle Bring, MD;  Location: [facility];  Service: Urology;  Laterality: Right;  ONLY NEEDS 60 MIN  . ENDOSCOPIC RETROGRADE CHOLANGIOPANCREATOGRAPHY (ERCP) WITH PROPOFOL N/A 04/13/2019   Procedure: ENDOSCOPIC  RETROGRADE CHOLANGIOPANCREATOGRAPHY (ERCP) WITH PROPOFOL;  Surgeon: Jackquline Denmark, MD;  [facility];  Service: Endoscopy;  Laterality: N/A;  . EXTRACORPOREAL SHOCK WAVE LITHOTRIPSY  01-17-2007   LEFT  . HERNIA REPAIR  01/30/2019   [facility] Left inguinal hernia repair  . HOLMIUM LASER APPLICATION Left 1/0/1751   Procedure: LASER OF LEFT RENAL PELVIC STONE;  Surgeon: Ailene Rud, MD;  [facility];  Service: Urology;  Laterality: Left;  . INGUINAL HERNIA REPAIR  2003  . JOINT REPLACEMENT     total knee right 03-01-17  . LEFT ACHILLES TENDON REPAIR  1992  . MASS EXCISION N/A 11/14/2018   Procedure: EXCISIONAL BIOPSY OF GLANS PENIS;  Surgeon: Raynelle Bring, MD;  [facility];  Service: Urology;  Laterality: N/A;  ONLY NEEDS 60 MIN  . NASAL SINUS SURGERY  2012  . PENILE SURGERY  OF MEATUS  1955  . REMOVAL OF STONES  04/13/2019   Procedure: REMOVAL OF STONES;  Surgeon: Jackquline Denmark, MD;  [facility];  Service: Endoscopy;;  . Spencer Cochran  04/13/2019   Procedure: Spencer Cochran;  Surgeon: Jackquline Denmark, MD;  [facility];  Service: Endoscopy;;  . TRANSTHORACIC ECHOCARDIOGRAM  03-31-2011   NORMAL LVEF  59%/ TRIVIAL MR/ DIASTOLIC DYSFUNCTION/ MODERATE LVH  . TRANSURETHRAL RESECTION OF PROSTATE N/A 04/20/2013   Procedure: TRANSURETHRAL RESECTION OF THE PROSTATE WITH GYRUS INSTRUMENTS;  Surgeon: Ailene Rud, MD;  [facility];  Service: Urology;  Laterality: N/A;    Current Medications: Current Meds  Medication Sig  . apixaban (ELIQUIS) 5 MG TABS tablet Take 1 tablet (5 mg total) by mouth 2 (two) times daily.  Marland Kitchen atorvastatin (LIPITOR) 40 MG tablet Take 40 mg by mouth at bedtime.   . benazepril (LOTENSIN) 40 MG tablet Take 1 tablet (40 mg total) by mouth daily.  . carvedilol (COREG) 25 MG tablet Take 1 tablet (25 mg total) by mouth 2 (two) times daily with a meal.  . ferrous sulfate 325 (65 FE) MG tablet Take 325 mg by mouth  daily.  . hydrochlorothiazide (HYDRODIURIL) 25 MG tablet Take 1 tablet (25 mg total) by mouth daily.  . nitroGLYCERIN (NITROSTAT) 0.4 MG SL tablet Place 0.4 mg under the tongue every 5 (five) minutes x 3 doses as needed for chest pain.   . Omega-3 Fatty Acids (OMEGA 3 PO) Take 520 mg by mouth daily.  Marland Kitchen omeprazole (PRILOSEC) 20 MG capsule Take 20 mg by mouth daily.   . potassium chloride (KLOR-CON) 10 MEQ tablet TAKE 1 TABLET BY MOUTH EVERY DAY  . Probiotic Product (PROBIOTIC DAILY PO) Take 1 tablet by mouth daily.  . timolol (TIMOPTIC) 0.5 % ophthalmic solution Place 1 drop into both eyes 2 (two) times daily.   Marland Kitchen triamcinolone cream (KENALOG) 0.1 % Apply 1 application topically daily as needed (skin irritation).      Allergies:   Hydrocodone   Social History   Socioeconomic History  . Marital status: Married    Spouse name: Not on file  . Number of children: 2  . Years of education: some college  . Highest education level: Not on file  Occupational History  . Occupation: retired Social research officer, government  Tobacco Use  . Smoking status: Never Smoker  . Smokeless tobacco: Never Used  Vaping Use  . Vaping Use: Never used  Substance and Sexual Activity  . Alcohol use: No  . Drug use: No  . Sexual activity: Yes  Other Topics Concern  . Not on file  Social History Narrative   Lives at home with his wife.   Right-handed.   Caffeine use:  32-40 ounces caffeine per day.   Social Determinants of Health   Financial Resource Strain:   . Difficulty of Paying Living Expenses: Not on file  Food Insecurity:   . Worried About Charity fundraiser in the Last Year: Not on file  . Ran Out of Food in the Last Year: Not on file  Transportation Needs:   . Lack of Transportation (Medical): Not on file  . Lack of Transportation (Non-Medical): Not on file  Physical Activity:   . Days of Exercise per Week: Not on file  . Minutes of Exercise per Session: Not on file  Stress:   . Feeling of Stress : Not on  file  Social Connections:   . Frequency of Communication with Friends and Family: Not on file  . Frequency of Social Gatherings with Friends and Family: Not on file  . Attends Religious Services: Not on file  . Active Member of Clubs or Organizations: Not on file  . Attends Archivist Meetings: Not on file  . Marital Status: Not on file     Family History: The patient's family history includes Breast cancer in his mother; Cirrhosis in his father; Colon cancer in his maternal aunt. There is no history of Esophageal cancer. ROS:   Please see the history of present illness.    All 14 point review of systems negative except as described per history of present illness  EKGs/Labs/Other Studies Reviewed:      Recent Labs: 03/16/2019: TSH 0.643 04/11/2019: Hemoglobin 12.9; Platelets 211.0 06/05/2019: ALT 19 08/10/2019: BUN 15; Creatinine, Ser 0.80; NT-Pro BNP 1,606; Potassium 4.8; Sodium 143  Recent Lipid Panel No results found for: CHOL, TRIG, HDL, CHOLHDL, VLDL, LDLCALC, LDLDIRECT  Physical Exam:    VS:  BP 124/70   Pulse 86   Ht 5\' 9"  (1.753 m)   Wt 184 lb 9.6 oz (83.7 kg)   SpO2 96%   BMI 27.26 kg/m     Wt Readings from Last 3 Encounters:  09/22/19 184 lb 9.6 oz (83.7 kg)  08/10/19 183 lb 12.8 oz (83.4 kg)  06/21/19 185 lb (83.9 kg)     GEN:  Well nourished, well developed in no acute distress HEENT: Normal NECK: No JVD; No carotid bruits LYMPHATICS: No lymphadenopathy CARDIAC: Irregular irregular, no murmurs, no rubs, no gallops RESPIRATORY:  Clear to auscultation without rales, wheezing or rhonchi  ABDOMEN: Soft, non-tender, non-distended MUSCULOSKELETAL:  No edema; No deformity  SKIN: Warm and dry LOWER EXTREMITIES: no swelling NEUROLOGIC:  Alert and oriented x 3 PSYCHIATRIC:  Normal affect   ASSESSMENT:    1. Permanent atrial fibrillation ([place])   2. Coronary artery disease involving native coronary artery of native heart without angina pectoris   3.  Obstructive sleep apnea   4. Gait abnormality   5. Dyslipidemia    PLAN:    In order of problems listed above:  1. Permanent atrial fibrillation.  Rate controlled.  Anticoagulated which I will continue. 2. Coronary disease stable from that point review no  recent issue. 3. Obstructive sleep apnea managed by pulmonary team from [facility].  He does have some issue with it and he will come medicate with them. 4. Gait abnormality I have no good explanation for this he may see neurologist for it. 5. Dyslipidemia has a Lipitor 40 which I will continue. 6. What might be concerned is the fact that he does have severe left ventricle hypertrophy on top of that he does have bilateral atrial enlargement old distinct have explanation with his high blood pressure as well as longstanding atrial fibrillation but I will make sure not dealing with amyloidosis.  I will ask him to have PYP nuclear test done.   Medication Adjustments/Labs and Tests Ordered: Current medicines are reviewed at length with the patient today.  Concerns regarding medicines are outlined above.  No orders of the defined types were placed in this encounter.  Medication changes: No orders of the defined types were placed in this encounter.   Signed, Park Liter, MD, [facility] 09/22/2019 11:21 AM    [place]

## 2019-09-23 ENCOUNTER — Other Ambulatory Visit: Payer: Self-pay | Admitting: Cardiology

## 2019-10-03 DIAGNOSIS — I2510 Atherosclerotic heart disease of native coronary artery without angina pectoris: Secondary | ICD-10-CM | POA: Diagnosis not present

## 2019-10-03 DIAGNOSIS — I10 Essential (primary) hypertension: Secondary | ICD-10-CM | POA: Diagnosis not present

## 2019-10-03 DIAGNOSIS — R7303 Prediabetes: Secondary | ICD-10-CM | POA: Diagnosis not present

## 2019-10-03 DIAGNOSIS — E782 Mixed hyperlipidemia: Secondary | ICD-10-CM | POA: Diagnosis not present

## 2019-10-04 ENCOUNTER — Telehealth (HOSPITAL_COMMUNITY): Payer: Self-pay | Admitting: *Deleted

## 2019-10-04 NOTE — Telephone Encounter (Signed)
Patient reminder call to be here on 10/11/19 at 8:15 for amyloid study.

## 2019-10-05 ENCOUNTER — Telehealth: Payer: Self-pay | Admitting: Cardiovascular Disease

## 2019-10-05 NOTE — Telephone Encounter (Signed)
Order sent to [facility] via community message. 

## 2019-10-05 NOTE — Telephone Encounter (Signed)
Patient calling to follow up on his F30i mask.

## 2019-10-06 NOTE — Telephone Encounter (Addendum)
Confirmation from [place] at [facility] order has been received. But Per Linus Orn (RT) patient is not eligible for a new mask until 10/12/19. Patient notified via cell phone.

## 2019-10-11 ENCOUNTER — Ambulatory Visit (INDEPENDENT_AMBULATORY_CARE_PROVIDER_SITE_OTHER): Payer: Medicare Other

## 2019-10-11 ENCOUNTER — Other Ambulatory Visit: Payer: Self-pay

## 2019-10-11 DIAGNOSIS — G4733 Obstructive sleep apnea (adult) (pediatric): Secondary | ICD-10-CM | POA: Diagnosis not present

## 2019-10-11 DIAGNOSIS — R269 Unspecified abnormalities of gait and mobility: Secondary | ICD-10-CM

## 2019-10-11 DIAGNOSIS — I4821 Permanent atrial fibrillation: Secondary | ICD-10-CM | POA: Diagnosis not present

## 2019-10-11 DIAGNOSIS — I2510 Atherosclerotic heart disease of native coronary artery without angina pectoris: Secondary | ICD-10-CM | POA: Diagnosis not present

## 2019-10-11 DIAGNOSIS — R0609 Other forms of dyspnea: Secondary | ICD-10-CM

## 2019-10-11 DIAGNOSIS — E785 Hyperlipidemia, unspecified: Secondary | ICD-10-CM | POA: Diagnosis not present

## 2019-10-11 DIAGNOSIS — R0600 Dyspnea, unspecified: Secondary | ICD-10-CM

## 2019-10-11 MED ORDER — TECHNETIUM TC 99M PYROPHOSPHATE
19.1000 | Freq: Once | INTRAVENOUS | Status: AC
  Administered 2019-10-11: 19.1 via INTRAVENOUS

## 2019-10-19 ENCOUNTER — Telehealth: Payer: Self-pay | Admitting: Cardiology

## 2019-10-19 DIAGNOSIS — E859 Amyloidosis, unspecified: Secondary | ICD-10-CM

## 2019-10-19 NOTE — Telephone Encounter (Signed)
Pt advised his results and aware Hayley will call him in the morning to let him know where he needs to have his labs suggested by Dr. Agustin Cree drawn.   "Study is suggesting for possible amyloidosis, he need to have light chain serum level, protein electrophoresis with immunofixation of both urine and serum"

## 2019-10-19 NOTE — Telephone Encounter (Signed)
Patient called again asking to speak with [facility] about his test results

## 2019-10-19 NOTE — Telephone Encounter (Signed)
Patient returning Spencer Cochran call in regards to results.

## 2019-10-20 DIAGNOSIS — E859 Amyloidosis, unspecified: Secondary | ICD-10-CM | POA: Diagnosis not present

## 2019-10-20 NOTE — Telephone Encounter (Signed)
Called patient. Informed him of where to get labs. Placed lab orders.

## 2019-10-23 DIAGNOSIS — E859 Amyloidosis, unspecified: Secondary | ICD-10-CM | POA: Diagnosis not present

## 2019-10-23 NOTE — Addendum Note (Signed)
Addended by: Senaida Ores on: 10/23/2019 09:09 AM   Modules accepted: Orders

## 2019-10-24 LAB — IFE+PROTEIN ELECTRO, 24-HR UR
% BETA, Urine: 42.7 %
ALBUMIN, U: 28.2 %
ALPHA 1 URINE: 2 %
ALPHA-2-GLOBULIN, U: 13.8 %
GAMMA GLOBULIN URINE: 13.4 %
Protein, 24H Urine: 378 mg/24 hr — ABNORMAL HIGH (ref 30–150)
Protein, Ur: 31.5 mg/dL

## 2019-10-24 LAB — FREE K+L LT CHAINS,QN,S
Ig Kappa Free Light Chain: 40.5 mg/L — ABNORMAL HIGH (ref 3.3–19.4)
Ig Lambda Free Light Chain: 25.6 mg/L (ref 5.7–26.3)
KAPPA/LAMBDA RATIO: 1.58 (ref 0.26–1.65)

## 2019-10-24 LAB — IMMUNOFIXATION, SERUM
IgA/Immunoglobulin A, Serum: 389 mg/dL (ref 61–437)
IgG (Immunoglobin G), Serum: 878 mg/dL (ref 603–1613)
IgM (Immunoglobulin M), Srm: 170 mg/dL — ABNORMAL HIGH (ref 15–143)

## 2019-10-30 ENCOUNTER — Telehealth: Payer: Self-pay | Admitting: Cardiology

## 2019-10-30 NOTE — Telephone Encounter (Signed)
New Message:     Pt says he need Dr Raliegh Ip to order him a new  C Pap machine please

## 2019-10-31 NOTE — Addendum Note (Signed)
Addended by: Senaida Ores on: 10/31/2019 11:28 AM   Modules accepted: Orders

## 2019-11-02 ENCOUNTER — Telehealth: Payer: Self-pay | Admitting: Cardiology

## 2019-11-02 NOTE — Telephone Encounter (Signed)
Called patient. Informed him there is no testing for [place] that needs be done. He needs referral to oncology which has already been sent. Explained this to his wife Monday but went over again with him. He verbally understood and will follow up with [facility] about appointment not further questions.

## 2019-11-02 NOTE — Telephone Encounter (Signed)
Patient states he is supposed to have some tests done, but has not heard anything. He states it is okay to leave a detailed message on his home voicemail.

## 2019-11-03 DIAGNOSIS — I2510 Atherosclerotic heart disease of native coronary artery without angina pectoris: Secondary | ICD-10-CM | POA: Diagnosis not present

## 2019-11-03 DIAGNOSIS — R7303 Prediabetes: Secondary | ICD-10-CM | POA: Diagnosis not present

## 2019-11-03 DIAGNOSIS — I10 Essential (primary) hypertension: Secondary | ICD-10-CM | POA: Diagnosis not present

## 2019-11-03 DIAGNOSIS — E782 Mixed hyperlipidemia: Secondary | ICD-10-CM | POA: Diagnosis not present

## 2019-11-06 DIAGNOSIS — Z23 Encounter for immunization: Secondary | ICD-10-CM | POA: Diagnosis not present

## 2019-11-06 NOTE — Telephone Encounter (Signed)
Patient is following up regarding status of request for new CPAP machine. Please return call.

## 2019-11-06 NOTE — Telephone Encounter (Signed)
Routed to [facility] triage pool

## 2019-11-22 ENCOUNTER — Other Ambulatory Visit: Payer: Self-pay

## 2019-11-22 ENCOUNTER — Telehealth: Payer: Self-pay | Admitting: Cardiology

## 2019-11-22 ENCOUNTER — Other Ambulatory Visit: Payer: Self-pay | Admitting: Cardiology

## 2019-11-22 MED ORDER — HYDROCHLOROTHIAZIDE 25 MG PO TABS: 25.0000 mg | ORAL_TABLET | Freq: Every day | ORAL | 1 refills | Status: DC

## 2019-11-22 MED ORDER — CARVEDILOL 25 MG PO TABS: 25.0000 mg | ORAL_TABLET | Freq: Two times a day (BID) | ORAL | 1 refills | Status: DC

## 2019-11-22 NOTE — Telephone Encounter (Signed)
Pt states that he would like to know what the plan is regarding the diagnosis of Amyloidosis. He states that he has not been told what he needs to do or how to precede.

## 2019-11-22 NOTE — Telephone Encounter (Signed)
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 Wendy Poet nurse. Please call.

## 2019-11-22 NOTE — Telephone Encounter (Signed)
*  STAT* If patient is at the pharmacy, call can be transferred to refill team.   1. Which medications need to be refilled? (please list name of each medication and dose if known) carvedilol (COREG) 25 MG tablet  2. Which pharmacy/location (including street and city if local pharmacy) is medication to be sent to? [facility] - [place], [place] - [address]  3. Do they need a 30 day or 90 day supply? 90  

## 2019-11-22 NOTE — Telephone Encounter (Signed)
Refill sent in per request.  

## 2019-11-23 NOTE — Telephone Encounter (Signed)
Can you please find out who is the oncologist telling him that, after I reviewed not then he will need to have an appointment so we can discuss this

## 2019-11-23 NOTE — Telephone Encounter (Signed)
Called spoke to patient he has not seen oncologist yet. Called their office was on hol for almost 10 minutes then transferred to leave a voicemail. If I don't hear back by mid day I will call back to follow up on referral that was sent 10/31/2019.

## 2019-11-24 NOTE — Telephone Encounter (Signed)
Called [facility] again. On hold and had to leave another voicemail. Dr. Agustin Cree aware. He is rounding at hospital today and is going to cancer center in person to notify them that patient needs to be scheduled.

## 2019-11-24 NOTE — Telephone Encounter (Signed)
Patient aware that Dr. Agustin Cree will go to cancer center today. Will follow up with patient to see if this expedited his appointment.

## 2019-11-27 ENCOUNTER — Telehealth: Payer: Self-pay | Admitting: Cardiology

## 2019-11-27 NOTE — Telephone Encounter (Signed)
    Pt is calling back to speak with Hayley. He said If he unable to answer to leave him a detailed message

## 2019-11-27 NOTE — Telephone Encounter (Signed)
Called patient informed him that referral was resent and I spoke to someone at [facility] center as well explained situation, they will be looking for referral. Patient verbally understood no further questions.

## 2019-11-27 NOTE — Telephone Encounter (Signed)
Spoke to [place]. Sent her labs she was requesting for patient. After she gets those she can schedule.

## 2019-11-27 NOTE — Telephone Encounter (Signed)
Left message for Spencer Cochran to return call.

## 2019-11-27 NOTE — Telephone Encounter (Signed)
Follow up:    Pamala Hurry returning a your call back.

## 2019-11-27 NOTE — Telephone Encounter (Signed)
Pamala Hurry is calling requesting to speak with Hayley in regards to a referral she sent. Please advise.

## 2019-11-27 NOTE — Telephone Encounter (Signed)
Spoke with Pamala Hurry sent the labs she was requesting after she gets that she can schedule.

## 2019-12-04 DIAGNOSIS — I10 Essential (primary) hypertension: Secondary | ICD-10-CM | POA: Diagnosis not present

## 2019-12-04 DIAGNOSIS — I2510 Atherosclerotic heart disease of native coronary artery without angina pectoris: Secondary | ICD-10-CM | POA: Diagnosis not present

## 2019-12-04 DIAGNOSIS — E782 Mixed hyperlipidemia: Secondary | ICD-10-CM | POA: Diagnosis not present

## 2019-12-04 DIAGNOSIS — R7303 Prediabetes: Secondary | ICD-10-CM | POA: Diagnosis not present

## 2019-12-04 NOTE — Telephone Encounter (Signed)
Called cancer center in [place] again. Spoke with Pamala Hurry again. She will call the patient today to schedule she reports. They are switching to epic and have been very busy. I will inform patient per Pamala Hurry patient will be scheduled today.

## 2019-12-04 NOTE — Telephone Encounter (Signed)
Called Cancer center in [place] no answer left a voicemail expressing urgency and concern as to why patient has not be scheduled. Left number for them to call back.

## 2019-12-08 ENCOUNTER — Other Ambulatory Visit: Payer: Self-pay

## 2019-12-08 DIAGNOSIS — R7303 Prediabetes: Secondary | ICD-10-CM | POA: Diagnosis not present

## 2019-12-08 DIAGNOSIS — E782 Mixed hyperlipidemia: Secondary | ICD-10-CM | POA: Diagnosis not present

## 2019-12-17 ENCOUNTER — Other Ambulatory Visit: Payer: Self-pay | Admitting: Oncology

## 2019-12-17 DIAGNOSIS — E859 Amyloidosis, unspecified: Secondary | ICD-10-CM

## 2019-12-17 NOTE — Progress Notes (Signed)
[facility]  [address] [place],  [place]  [address] [phone]  Clinic Day:  12/18/2019  Referring physician: Maryella Shivers, MD   HISTORY OF PRESENT ILLNESS:  The patient is a 74 y.o. male  who I was asked to consult upon for possible amyloidosis.  An initial echocardiogram showed severe concentric left ventricular hypertrophy, as well as severely dilated bilateral atria.  In September 2021, myocardial amyloid imaging study was done, for which Grade 1 scoring was identified.  Quantitative findings showed a heart/contralateal lung ratio of 1-1.5.  These findings were equivocal for TTR amyloidosis being present.  Based upon these findings, quantitative immunoglobulins, serum light chains and a serum protein electrophoresis were done.  A polyclonal increase in immunoglobulins was seen.  However, his kappa light chain level was moderately elevated at 40.5 mg/L.  Based upon these findings, he comes in today to discuss what can be further done to potentially exclude the presence of amyloidosis.  Overall, the patient is doing well.  He denies having any significant changes in his health over these past few months.    PAST MEDICAL HISTORY:   Past Medical History:  Diagnosis Date  . Arthritis knees and ankle  . Asymptomatic gallstones   . Cancer ([place])    skin   . Cataract immature BILATERAL EYES  . Coronary artery disease CARDIOLOGIST-  DR Agustin Cree  [facility])---  LAST VISIT 04-29-2011   DENIES CARDIAC SYMPTOMS  . DDD (degenerative disc disease), lumbar   . Dysrhythmia    A Fib   . Frequency of urination   . GERD (gastroesophageal reflux disease)   . Glaucoma    both eyes  . History of colon polyps   . History of kidney stones   . Hypertension   . 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 Left ventricular diastolic dysfunction PER ECHO 03-31-2011  W/ CHART  . Nocturia   . OSA on CPAP    wears cpap  .  Pre-diabetes    last hemaglobin a 1 c was 5.6  . Unsteadiness   . Urethral stricture     PAST SURGICAL HISTORY:   Past Surgical History:  Procedure Laterality Date  . APPENDECTOMY  1972  . BALLOON DILATION N/A 04/13/2019   Procedure: BALLOON DILATION;  Surgeon: Jackquline Denmark, MD;  Location: [facility];  Service: Endoscopy;  Laterality: N/A;  . CARDIOVASCULAR STRESS TEST  04-29-2010   DR Halford Chessman   NO EVIDENCE ISCHEMIA/ NORMAL LVSF AND WALL MOTION/ EF 63%  . CERVICAL FUSION  2006   C3 - 6  . COLONOSCOPY  03/23/2012   Small colonic polyps, status post polypectomy. Pancolonic diverticulosis predominantly in the left colon.Small internal and external hemorrhoids  . COLONOSCOPY  2019   Dr Orlena Sheldon. Diverticulosis. Doesn't think he removed polpys   . CYSTO/ URETHRAL DILATION/ TRANSURETHRAL INCISIONOF PROSTATE  01-13-2007  . CYSTOSCOPY  2005  . CYSTOSCOPY W/ RETROGRADES  06/12/2011   Procedure: CYSTOSCOPY WITH RETROGRADE PYELOGRAM;  Surgeon: Ailene Rud, MD;  [facility];  Service: Urology;  Laterality: N/A;  cysto, urethral dilation, right retrograde pyelogram    . CYSTOSCOPY WITH RETROGRADE PYELOGRAM, URETEROSCOPY AND STENT PLACEMENT Left 03/08/2014   Procedure: CYSTOSCOPY WITH LEFT RETROGRADE PYELOGRAM/LEFT  URETEROSCOPY;  Surgeon: Ailene Rud, MD;  Location: [facility];  Service: Urology;  Laterality: Left;  . CYSTOSCOPY WITH URETHRAL DILATATION N/A 04/20/2013   Procedure: CYSTOSCOPY WITH URETHRAL DILATATION;  Surgeon: Ailene Rud, MD;  Location: [facility];  Service: Urology;  Laterality: N/A;  . CYSTOSCOPY/URETEROSCOPY/HOLMIUM LASER/STENT PLACEMENT Left 06/10/2017   Procedure: CYSTOSCOPY/RETROGRADE/URETEROSCOPY/HOLMIUM LASER/STENT PLACEMENT;  Surgeon: Raynelle Bring, MD;  Location: [facility];  Service: Urology;  Laterality: Left;  . CYSTOSCOPY/URETEROSCOPY/HOLMIUM LASER/STENT PLACEMENT Right 03/09/2019   Procedure:  CYSTOSCOPY/RETROGRADE/URETEROSCOPY/HOLMIUM LASER/STENT PLACEMENT;  Surgeon: Raynelle Bring, MD;  Location: [facility];  Service: Urology;  Laterality: Right;  ONLY NEEDS 60 MIN  . ENDOSCOPIC RETROGRADE CHOLANGIOPANCREATOGRAPHY (ERCP) WITH PROPOFOL N/A 04/13/2019   Procedure: ENDOSCOPIC RETROGRADE CHOLANGIOPANCREATOGRAPHY (ERCP) WITH PROPOFOL;  Surgeon: Jackquline Denmark, MD;  Location: [facility];  Service: Endoscopy;  Laterality: N/A;  . EXTRACORPOREAL SHOCK WAVE LITHOTRIPSY  01-17-2007   LEFT  . HERNIA REPAIR  01/30/2019   [facility] Left inguinal hernia repair  . HOLMIUM LASER APPLICATION Left 0/03/6376   Procedure: LASER OF LEFT RENAL PELVIC STONE;  Surgeon: Ailene Rud, MD;  Location: [facility];  Service: Urology;  Laterality: Left;  . INGUINAL HERNIA REPAIR  2003  . JOINT REPLACEMENT     total knee right 03-01-17  . LEFT ACHILLES TENDON REPAIR  1992  . MASS EXCISION N/A 11/14/2018   Procedure: EXCISIONAL BIOPSY OF GLANS PENIS;  Surgeon: Raynelle Bring, MD;  Location: [facility];  Service: Urology;  Laterality: N/A;  ONLY NEEDS 60 MIN  . NASAL SINUS SURGERY  2012  . PENILE SURGERY  OF MEATUS  1955  . REMOVAL OF STONES  04/13/2019   Procedure: REMOVAL OF STONES;  Surgeon: Jackquline Denmark, MD;  Location: [facility];  Service: Endoscopy;;  . [name]  04/13/2019   Procedure: [name];  Surgeon: Jackquline Denmark, MD;  Location: [facility];  Service: Endoscopy;;  . TRANSTHORACIC ECHOCARDIOGRAM  03-31-2011   NORMAL LVEF  59%/ TRIVIAL MR/ DIASTOLIC DYSFUNCTION/ MODERATE LVH  . TRANSURETHRAL RESECTION OF PROSTATE N/A 04/20/2013   Procedure: TRANSURETHRAL RESECTION OF THE PROSTATE WITH GYRUS INSTRUMENTS;  Surgeon: Ailene Rud, MD;  Location: [facility];  Service: Urology;  Laterality: N/A;    CURRENT MEDICATIONS:   Current Outpatient Medications  Medication Sig Dispense Refill  . apixaban (ELIQUIS) 5 MG TABS tablet Take 1 tablet (5 mg total) by mouth 2 (two)  times daily. 180 tablet 3  . atorvastatin (LIPITOR) 40 MG tablet Take 40 mg by mouth at bedtime.     . benazepril (LOTENSIN) 40 MG tablet Take 1 tablet (40 mg total) by mouth daily. 90 tablet 1  . carvedilol (COREG) 25 MG tablet Take 1 tablet (25 mg total) by mouth 2 (two) times daily with a meal. 180 tablet 1  . ferrous sulfate 325 (65 FE) MG tablet Take 325 mg by mouth daily.    . hydrochlorothiazide (HYDRODIURIL) 25 MG tablet Take 1 tablet (25 mg total) by mouth daily. 90 tablet 1  . nitroGLYCERIN (NITROSTAT) 0.4 MG SL tablet Place 0.4 mg under the tongue every 5 (five) minutes x 3 doses as needed for chest pain.     . Omega-3 Fatty Acids (OMEGA 3 PO) Take 520 mg by mouth daily.    Marland Kitchen omeprazole (PRILOSEC) 20 MG capsule Take 20 mg by mouth daily.     . potassium chloride (KLOR-CON) 10 MEQ tablet TAKE 1 TABLET BY MOUTH EVERY DAY 90 tablet 3  . Probiotic Product (PROBIOTIC DAILY PO) Take 1 tablet by mouth daily.    . timolol (TIMOPTIC) 0.5 % ophthalmic solution Place 1 drop into both eyes 2 (two) times daily.     Marland Kitchen triamcinolone  cream (KENALOG) 0.1 % Apply 1 application topically daily as needed (skin irritation).      No current facility-administered medications for this visit.    ALLERGIES:   Allergies  Allergen Reactions  . Hydrocodone Other (See Comments)    insomnia "awake for 48 hours"    FAMILY HISTORY:   Family History  Problem Relation Age of Onset  . Breast cancer Mother   . Cirrhosis Father   . Colon cancer Maternal Aunt        died in 79's   . Esophageal cancer Neg Hx     SOCIAL HISTORY:  The patient was born in [place].  He lives in town with his wife of 63 years.  They have 2 children and 6 grandchildren.  He served in [employer] for 20 years.  He was a Administrator for 11 years.  He was also a Designer, industrial/product for 9 years.  There is no history of alcohol or tobacco abuse.    REVIEW OF SYSTEMS:  Review of Systems  Constitutional: Negative for fatigue,  fever and unexpected weight change.  Respiratory: Positive for cough. Negative for chest tightness, hemoptysis and shortness of breath.   Cardiovascular: Negative for chest pain and palpitations.  Gastrointestinal: Positive for blood in stool (seen intermittently) and constipation. Negative for abdominal distention, abdominal pain, diarrhea, nausea and vomiting.  Genitourinary: Positive for hematuria (seen rarely). Negative for dysuria and frequency.   Musculoskeletal: Positive for back pain. Negative for arthralgias, gait problem and myalgias.  Skin: Negative for itching and rash.  Neurological: Negative for dizziness, gait problem, headaches and light-headedness.  Psychiatric/Behavioral: Negative for depression and suicidal ideas. The patient is not nervous/anxious.      PHYSICAL EXAM:  Blood pressure 138/82, pulse 88, temperature 98.6 F (37 C), temperature source Oral, resp. rate 18, height 5' 9"  (1.753 m), weight 187 lb 12.8 oz (85.2 kg), SpO2 97 %. Wt Readings from Last 3 Encounters:  12/18/19 187 lb 12.8 oz (85.2 kg)  09/22/19 184 lb 9.6 oz (83.7 kg)  08/10/19 183 lb 12.8 oz (83.4 kg)   Body mass index is 27.73 kg/m. Performance status (ECOG): 1 Physical Exam Constitutional:      Appearance: Normal appearance. He is not ill-appearing.  HENT:     Mouth/Throat:     Mouth: Mucous membranes are moist.     Pharynx: Oropharynx is clear. No oropharyngeal exudate or posterior oropharyngeal erythema.  Cardiovascular:     Rate and Rhythm: Normal rate and regular rhythm.     Heart sounds: No murmur heard.  No friction rub. No gallop.   Pulmonary:     Effort: Pulmonary effort is normal. No respiratory distress.     Breath sounds: Normal breath sounds. No wheezing, rhonchi or rales.  Abdominal:     General: Bowel sounds are normal. There is no distension.     Palpations: Abdomen is soft. There is no mass.     Tenderness: There is no abdominal tenderness.  Musculoskeletal:         General: No swelling.     Right lower leg: No edema.     Left lower leg: No edema.  Lymphadenopathy:     Cervical: No cervical adenopathy.     Upper Body:     Right upper body: No supraclavicular or axillary adenopathy.     Left upper body: No supraclavicular or axillary adenopathy.     Lower Body: No right inguinal adenopathy. No left inguinal adenopathy.  Skin:  General: Skin is warm.     Coloration: Skin is not jaundiced.     Findings: No lesion or rash.  Neurological:     General: No focal deficit present.     Mental Status: He is alert and oriented to person, place, and time. Mental status is at baseline.     Cranial Nerves: Cranial nerves are intact.  Psychiatric:        Mood and Affect: Mood normal.        Behavior: Behavior normal.        Thought Content: Thought content normal.     LABS:     Ref. Range 12/18/2019 00:00  Sodium Latest Ref Range: 137 - 147  143  Potassium Latest Ref Range: 3.4 - 5.3  3.6  Chloride Latest Ref Range: 99 - 108  106  CO2 Latest Ref Range: 13 - 22  32 (A)  Glucose Unknown 106  BUN Latest Ref Range: 4 - 21  28 (A)  Creatinine Latest Ref Range: 0.6 - 1.3  0.8  Calcium Latest Ref Range: 8.7 - 10.7  9.4  Alkaline Phosphatase Latest Ref Range: 25 - 125  58  Albumin Latest Ref Range: 3.5 - 5.0  3.7  AST Latest Ref Range: 14 - 40  39  ALT Latest Ref Range: 10 - 40  19  Bilirubin, Total Unknown 0.6    Results for RAEL, YO (MRN [id number]) as of 12/20/2019 17:42  Ref. Range 10/23/2019 10:06 12/18/2019 00:00 12/18/2019 15:11  Total Protein ELP Latest Ref Range: 6.0 - 8.5 g/dL   6.2  Albumin SerPl Elph-Mcnc Latest Ref Range: 2.9 - 4.4 g/dL   3.2  Albumin/Glob SerPl Latest Ref Range: 0.7 - 1.7    1.1  Alpha2 Glob SerPl Elph-Mcnc Latest Ref Range: 0.4 - 1.0 g/dL   0.7  Alpha 1 Latest Ref Range: 0.0 - 0.4 g/dL   0.2  Gamma Glob SerPl Elph-Mcnc Latest Ref Range: 0.4 - 1.8 g/dL   1.1  M Protein SerPl Elph-Mcnc Latest Ref Range: Not  Observed g/dL   Not Observed  IFE 1 Unknown   Comment (A)  Globulin, Total Latest Ref Range: 2.2 - 3.9 g/dL   3.0  B-Globulin SerPl Elph-Mcnc Latest Ref Range: 0.7 - 1.3 g/dL   1.0  M-SPIKE, % Latest Ref Range: Not Observed % Not Observed    IgG (Immunoglobin G), Serum Latest Ref Range: 603 - 1,613 mg/dL   847  IgM (Immunoglobulin M), Srm Latest Ref Range: 15 - 143 mg/dL   166 (H)  IgA Latest Ref Range: 61 - 437 mg/dL   399     Comment: Polyclonal increase detected in one or more immunoglobulins.     Ref. Range 12/18/2019 15:11  Kappa free light chain Latest Ref Range: 3.3 - 19.4 mg/L 44.0 (H)  Lamda free light chains Latest Ref Range: 5.7 - 26.3 mg/L 25.8  Kappa, lamda light chain ratio Latest Ref Range: 0.26 - 1.65  1.71 (H)     ASSESSMENT & PLAN:  A 74 y.o. male who I was asked to consult upon for the possibility of amyloidosis being present.  Labs collected today mirror those done 2 months ago, including the presence of a mildly elevated kappa light chain level.  After speaking with his cardiologists, the plan will be for him to undergo a bone marrow biopsy and an abdominal fat pad biopsy to definitively rule out the presence of amyloidosis.  His bone marrow biopsy will be done later  this week.  Interventional radiology will perform his abdominal fat pat biopsy in the forthcoming weeks.  I will see him back in 2-3 weeks to go over his biopsy results and their implications.  The patient understands all the plans discussed today and is in agreement with them.  I do appreciate Maryella Shivers, MD for his new consult.   Cleatis Fandrich Macarthur Critchley, MD

## 2019-12-18 ENCOUNTER — Inpatient Hospital Stay: Payer: Medicare Other | Admitting: Hematology and Oncology

## 2019-12-18 ENCOUNTER — Other Ambulatory Visit: Payer: Self-pay

## 2019-12-18 ENCOUNTER — Encounter: Payer: Self-pay | Admitting: Oncology

## 2019-12-18 ENCOUNTER — Telehealth: Payer: Self-pay | Admitting: Oncology

## 2019-12-18 ENCOUNTER — Inpatient Hospital Stay: Payer: Medicare Other | Attending: Oncology | Admitting: Oncology

## 2019-12-18 VITALS — BP 138/82 | HR 88 | Temp 98.6°F | Resp 18 | Ht 69.0 in | Wt 187.8 lb

## 2019-12-18 DIAGNOSIS — E859 Amyloidosis, unspecified: Secondary | ICD-10-CM

## 2019-12-18 DIAGNOSIS — R768 Other specified abnormal immunological findings in serum: Secondary | ICD-10-CM | POA: Diagnosis not present

## 2019-12-18 DIAGNOSIS — E782 Mixed hyperlipidemia: Secondary | ICD-10-CM | POA: Diagnosis not present

## 2019-12-18 DIAGNOSIS — R7303 Prediabetes: Secondary | ICD-10-CM | POA: Diagnosis not present

## 2019-12-18 DIAGNOSIS — I10 Essential (primary) hypertension: Secondary | ICD-10-CM | POA: Diagnosis not present

## 2019-12-18 DIAGNOSIS — Z0001 Encounter for general adult medical examination with abnormal findings: Secondary | ICD-10-CM | POA: Diagnosis not present

## 2019-12-18 DIAGNOSIS — D649 Anemia, unspecified: Secondary | ICD-10-CM | POA: Diagnosis not present

## 2019-12-18 DIAGNOSIS — I2510 Atherosclerotic heart disease of native coronary artery without angina pectoris: Secondary | ICD-10-CM | POA: Diagnosis not present

## 2019-12-18 LAB — BASIC METABOLIC PANEL
BUN: 28 — AB (ref 4–21)
CO2: 32 — AB (ref 13–22)
Chloride: 106 (ref 99–108)
Creatinine: 0.8 (ref 0.6–1.3)
Glucose: 106
Potassium: 3.6 (ref 3.4–5.3)
Sodium: 143 (ref 137–147)

## 2019-12-18 LAB — CBC AND DIFFERENTIAL
HCT: 40 — AB (ref 41–53)
Hemoglobin: 13.5 (ref 13.5–17.5)
Neutrophils Absolute: 2.05
Platelets: 226 (ref 150–399)
WBC: 5.7

## 2019-12-18 LAB — HEPATIC FUNCTION PANEL
ALT: 19 (ref 10–40)
AST: 39 (ref 14–40)
Alkaline Phosphatase: 58 (ref 25–125)
Bilirubin, Total: 0.6

## 2019-12-18 LAB — CBC: RBC: 4.07 (ref 3.87–5.11)

## 2019-12-18 LAB — COMPREHENSIVE METABOLIC PANEL
Albumin: 3.7 (ref 3.5–5.0)
Calcium: 9.4 (ref 8.7–10.7)

## 2019-12-18 NOTE — Telephone Encounter (Signed)
Per 11/15 LOS - Scheduled Follow up Appt for 12/3 - Gave Appt to patient

## 2019-12-19 ENCOUNTER — Telehealth: Payer: Self-pay | Admitting: Oncology

## 2019-12-19 LAB — MULTIPLE MYELOMA PANEL, SERUM
Albumin SerPl Elph-Mcnc: 3.2 g/dL (ref 2.9–4.4)
Albumin/Glob SerPl: 1.1 (ref 0.7–1.7)
Alpha 1: 0.2 g/dL (ref 0.0–0.4)
Alpha2 Glob SerPl Elph-Mcnc: 0.7 g/dL (ref 0.4–1.0)
B-Globulin SerPl Elph-Mcnc: 1 g/dL (ref 0.7–1.3)
Gamma Glob SerPl Elph-Mcnc: 1.1 g/dL (ref 0.4–1.8)
Globulin, Total: 3 g/dL (ref 2.2–3.9)
IgA: 399 mg/dL (ref 61–437)
IgG (Immunoglobin G), Serum: 847 mg/dL (ref 603–1613)
IgM (Immunoglobulin M), Srm: 166 mg/dL — ABNORMAL HIGH (ref 15–143)
Total Protein ELP: 6.2 g/dL (ref 6.0–8.5)

## 2019-12-19 LAB — KAPPA/LAMBDA LIGHT CHAINS
Kappa free light chain: 44 mg/L — ABNORMAL HIGH (ref 3.3–19.4)
Kappa, lambda light chain ratio: 1.71 — ABNORMAL HIGH (ref 0.26–1.65)
Lambda free light chains: 25.8 mg/L (ref 5.7–26.3)

## 2019-12-19 NOTE — Telephone Encounter (Signed)
Patient requested to Reschedule Appt Time for 11/19 Bone Marrow Appt. Per Tammy unable to R/S Time on 11/19 but on another day it may be at a different time.  Patient chose to keep appt on 11/19

## 2019-12-20 ENCOUNTER — Telehealth: Payer: Self-pay | Admitting: Cardiology

## 2019-12-20 ENCOUNTER — Other Ambulatory Visit: Payer: Self-pay | Admitting: Oncology

## 2019-12-20 NOTE — Telephone Encounter (Signed)
Left message for patient to return call.

## 2019-12-20 NOTE — Telephone Encounter (Signed)
Called patient. He is referring to paperwork from [employer] that was faxed to Dr. Claiborne Billings that has not been responded to for his cpap machine. He reports this was sent over 2 months ago and would like some clarity on this situation. Will route to [facility] so it can be routed to appropriate staff for Dr. Claiborne Billings.

## 2019-12-20 NOTE — Telephone Encounter (Signed)
New message:      Patient calling to ask about some paper work that was sent twice. Patient would like for a doctor to call him. [phone]

## 2019-12-21 ENCOUNTER — Other Ambulatory Visit: Payer: Self-pay

## 2019-12-21 DIAGNOSIS — Z87442 Personal history of urinary calculi: Secondary | ICD-10-CM | POA: Insufficient documentation

## 2019-12-21 DIAGNOSIS — I10 Essential (primary) hypertension: Secondary | ICD-10-CM | POA: Insufficient documentation

## 2019-12-21 DIAGNOSIS — M1990 Unspecified osteoarthritis, unspecified site: Secondary | ICD-10-CM | POA: Insufficient documentation

## 2019-12-21 DIAGNOSIS — C801 Malignant (primary) neoplasm, unspecified: Secondary | ICD-10-CM | POA: Insufficient documentation

## 2019-12-21 DIAGNOSIS — R351 Nocturia: Secondary | ICD-10-CM | POA: Insufficient documentation

## 2019-12-21 DIAGNOSIS — Z86010 Personal history of colonic polyps: Secondary | ICD-10-CM | POA: Insufficient documentation

## 2019-12-21 DIAGNOSIS — R2681 Unsteadiness on feet: Secondary | ICD-10-CM | POA: Insufficient documentation

## 2019-12-21 DIAGNOSIS — R7303 Prediabetes: Secondary | ICD-10-CM | POA: Insufficient documentation

## 2019-12-21 DIAGNOSIS — I499 Cardiac arrhythmia, unspecified: Secondary | ICD-10-CM | POA: Insufficient documentation

## 2019-12-21 DIAGNOSIS — M5136 Other intervertebral disc degeneration, lumbar region: Secondary | ICD-10-CM | POA: Insufficient documentation

## 2019-12-21 DIAGNOSIS — I519 Heart disease, unspecified: Secondary | ICD-10-CM | POA: Insufficient documentation

## 2019-12-21 DIAGNOSIS — H9190 Unspecified hearing loss, unspecified ear: Secondary | ICD-10-CM | POA: Insufficient documentation

## 2019-12-21 DIAGNOSIS — K219 Gastro-esophageal reflux disease without esophagitis: Secondary | ICD-10-CM | POA: Insufficient documentation

## 2019-12-21 DIAGNOSIS — G4733 Obstructive sleep apnea (adult) (pediatric): Secondary | ICD-10-CM | POA: Insufficient documentation

## 2019-12-21 DIAGNOSIS — M51369 Other intervertebral disc degeneration, lumbar region without mention of lumbar back pain or lower extremity pain: Secondary | ICD-10-CM | POA: Insufficient documentation

## 2019-12-21 DIAGNOSIS — K8020 Calculus of gallbladder without cholecystitis without obstruction: Secondary | ICD-10-CM | POA: Insufficient documentation

## 2019-12-21 DIAGNOSIS — R350 Frequency of micturition: Secondary | ICD-10-CM | POA: Insufficient documentation

## 2019-12-22 ENCOUNTER — Ambulatory Visit (INDEPENDENT_AMBULATORY_CARE_PROVIDER_SITE_OTHER): Payer: Medicare Other | Admitting: Cardiology

## 2019-12-22 ENCOUNTER — Encounter: Payer: Self-pay | Admitting: Cardiology

## 2019-12-22 ENCOUNTER — Other Ambulatory Visit: Payer: Self-pay

## 2019-12-22 VITALS — BP 140/70 | HR 88 | Ht 69.0 in | Wt 193.0 lb

## 2019-12-22 DIAGNOSIS — I519 Heart disease, unspecified: Secondary | ICD-10-CM

## 2019-12-22 DIAGNOSIS — Z20828 Contact with and (suspected) exposure to other viral communicable diseases: Secondary | ICD-10-CM | POA: Diagnosis not present

## 2019-12-22 DIAGNOSIS — Z9989 Dependence on other enabling machines and devices: Secondary | ICD-10-CM | POA: Diagnosis not present

## 2019-12-22 DIAGNOSIS — I10 Essential (primary) hypertension: Secondary | ICD-10-CM | POA: Diagnosis not present

## 2019-12-22 DIAGNOSIS — I4821 Permanent atrial fibrillation: Secondary | ICD-10-CM | POA: Diagnosis not present

## 2019-12-22 DIAGNOSIS — G4733 Obstructive sleep apnea (adult) (pediatric): Secondary | ICD-10-CM | POA: Diagnosis not present

## 2019-12-22 DIAGNOSIS — I2510 Atherosclerotic heart disease of native coronary artery without angina pectoris: Secondary | ICD-10-CM | POA: Diagnosis not present

## 2019-12-22 DIAGNOSIS — Z1159 Encounter for screening for other viral diseases: Secondary | ICD-10-CM | POA: Diagnosis not present

## 2019-12-22 NOTE — Progress Notes (Signed)
Cardiology Office Note:    Date:  12/22/2019   ID:  Spencer Cochran, DOB 05/23/1945, MRN [id number]  PCP:  Maryella Shivers, MD  Cardiologist:  Jenne Campus, MD    Referring MD: Maryella Shivers, MD   Chief Complaint  Patient presents with  . Follow-up  I am doing fine  History of Present Illness:    Spencer Cochran is a 74 y.o. male with past medical history significant for remote coronary artery disease, permanent atrial fibrillation, recently he required gallbladder surgery, he lost significant amount of weight during the process.  However also recently after reviewing his echocardiogram we started raising suspicion for possibility of amyloidosis.  His PPY was equivocal, also immunoelectrophoresis of urine as well as serum showed some abnormality and he was referred to oncology team.  Plan is to pursue bone marrow biopsy which is going to happen today, and also for tissue biopsy for spectral analysis to look for amyloidosis.  Overall he is clinically doing well.  He goes to gym on the regular basis in the [place] is planning to go after my visit today.  He exercise does have 12 machines.  He also try to walk some.  He tells me that in his property mailboxes downhill and when he walks uphill he will develop shortness of breath but not worse than before.  Denies having a chest pain tightness squeezing pressure burning chest.  He does have some swelling of lower extremities at evening time he wears elastic stockings every other day.  There is no proximal nocturnal dyspnea.  Overall hemodynamically he looks to to be compensated.  Past Medical History:  Diagnosis Date  . Arthritis knees and ankle  . Asymptomatic gallstones   . Atrial fibrillation ([place]) 03/08/2014  . Benign prostatic hypertrophy with incomplete bladder emptying 04/20/2013  . Biliary obstruction   . Cancer ([place])    skin   . Cataract immature BILATERAL EYES  . Choledocholithiasis   . Coronary artery disease CARDIOLOGIST-   DR Agustin Cree  Tia Alert)---  LAST VISIT 04-29-2011   DENIES CARDIAC SYMPTOMS  . Coronary artery disease involving native coronary artery of native heart without angina pectoris 10/09/2014   Luminal disease but cardiac catheterization from 2014, stress test in the summer of 2018 showed no evidence of ischemia  Formatting of this note might be different from the original. 20% circumflex 20% RCA in July 2014  . DDD (degenerative disc disease), lumbar   . Dyslipidemia 10/01/2016  . Dysrhythmia    A Fib   . Frequency of urination   . Gait abnormality 08/03/2018  . GERD (gastroesophageal reflux disease)   . Glaucoma    both eyes  . History of colon polyps   . History of kidney stones   . Hypertension   . 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 Left ventricular diastolic dysfunction PER ECHO 03-31-2011  W/ CHART  . Nocturia   . Obstructive jaundice   . Obstructive sleep apnea 06/05/2019  . OSA on CPAP    wears cpap  . Palpitations 08/10/2019  . Paresthesia 07/18/2018  . Pre-diabetes    last hemaglobin a 1 c was 5.6  . Unsteadiness   . Urethral stricture     Past Surgical History:  Procedure Laterality Date  . APPENDECTOMY  1972  . BALLOON DILATION N/A 04/13/2019   Procedure: BALLOON DILATION;  Surgeon: Jackquline Denmark, MD;  Location: [facility];  Service: Endoscopy;  Laterality: N/A;  . CARDIOVASCULAR  STRESS TEST  04-29-2010   DR KRASAWSKI   NO EVIDENCE ISCHEMIA/ NORMAL LVSF AND WALL MOTION/ EF 63%  . CERVICAL FUSION  2006   C3 - 6  . COLONOSCOPY  03/23/2012   Small colonic polyps, status post polypectomy. Pancolonic diverticulosis predominantly in the left colon.Small internal and external hemorrhoids  . COLONOSCOPY  2019   Dr Orlena Sheldon. Diverticulosis. Doesn't think he removed polpys   . CYSTO/ URETHRAL DILATION/ TRANSURETHRAL INCISIONOF PROSTATE  01-13-2007  . CYSTOSCOPY  2005  . CYSTOSCOPY W/ RETROGRADES  06/12/2011   Procedure: CYSTOSCOPY WITH RETROGRADE  PYELOGRAM;  Surgeon: Ailene Rud, MD;  [facility];  Service: Urology;  Laterality: N/A;  cysto, urethral dilation, right retrograde pyelogram    . CYSTOSCOPY WITH RETROGRADE PYELOGRAM, URETEROSCOPY AND STENT PLACEMENT Left 03/08/2014   Procedure: CYSTOSCOPY WITH LEFT RETROGRADE PYELOGRAM/LEFT  URETEROSCOPY;  Surgeon: Ailene Rud, MD;  Location: [facility];  Service: Urology;  Laterality: Left;  . CYSTOSCOPY WITH URETHRAL DILATATION N/A 04/20/2013   Procedure: CYSTOSCOPY WITH URETHRAL DILATATION;  Surgeon: Ailene Rud, MD;  Location: [facility];  Service: Urology;  Laterality: N/A;  . CYSTOSCOPY/URETEROSCOPY/HOLMIUM LASER/STENT PLACEMENT Left 06/10/2017   Procedure: CYSTOSCOPY/RETROGRADE/URETEROSCOPY/HOLMIUM LASER/STENT PLACEMENT;  Surgeon: Raynelle Bring, MD;  Location: [facility];  Service: Urology;  Laterality: Left;  . CYSTOSCOPY/URETEROSCOPY/HOLMIUM LASER/STENT PLACEMENT Right 03/09/2019   Procedure: CYSTOSCOPY/RETROGRADE/URETEROSCOPY/HOLMIUM LASER/STENT PLACEMENT;  Surgeon: Raynelle Bring, MD;  Location: [facility];  Service: Urology;  Laterality: Right;  ONLY NEEDS 60 MIN  . ENDOSCOPIC RETROGRADE CHOLANGIOPANCREATOGRAPHY (ERCP) WITH PROPOFOL N/A 04/13/2019   Procedure: ENDOSCOPIC RETROGRADE CHOLANGIOPANCREATOGRAPHY (ERCP) WITH PROPOFOL;  Surgeon: Jackquline Denmark, MD;  Location: [facility];  Service: Endoscopy;  Laterality: N/A;  . EXTRACORPOREAL SHOCK WAVE LITHOTRIPSY  01-17-2007   LEFT  . HERNIA REPAIR  01/30/2019   [facility] Left inguinal hernia repair  . HOLMIUM LASER APPLICATION Left 09/07/7617   Procedure: LASER OF LEFT RENAL PELVIC STONE;  Surgeon: Ailene Rud, MD;  Location: [facility];  Service: Urology;  Laterality: Left;  . INGUINAL HERNIA REPAIR  2003  . JOINT REPLACEMENT     total knee right 03-01-17  . LEFT ACHILLES TENDON REPAIR  1992  . MASS EXCISION N/A 11/14/2018   Procedure: EXCISIONAL BIOPSY OF GLANS PENIS;   Surgeon: Raynelle Bring, MD;  Location: [facility];  Service: Urology;  Laterality: N/A;  ONLY NEEDS 60 MIN  . NASAL SINUS SURGERY  2012  . PENILE SURGERY  OF MEATUS  1955  . REMOVAL OF STONES  04/13/2019   Procedure: REMOVAL OF STONES;  Surgeon: Jackquline Denmark, MD;  Location: [facility];  Service: Endoscopy;;  . [name]  04/13/2019   Procedure: [name];  Surgeon: Jackquline Denmark, MD;  Location: [facility];  Service: Endoscopy;;  . TRANSTHORACIC ECHOCARDIOGRAM  03-31-2011   NORMAL LVEF  59%/ TRIVIAL MR/ DIASTOLIC DYSFUNCTION/ MODERATE LVH  . TRANSURETHRAL RESECTION OF PROSTATE N/A 04/20/2013   Procedure: TRANSURETHRAL RESECTION OF THE PROSTATE WITH GYRUS INSTRUMENTS;  Surgeon: Ailene Rud, MD;  Location: [facility];  Service: Urology;  Laterality: N/A;    Current Medications: Current Meds  Medication Sig  . apixaban (ELIQUIS) 5 MG TABS tablet Take 1 tablet (5 mg total) by mouth 2 (two) times daily.  Marland Kitchen atorvastatin (LIPITOR) 40 MG tablet Take 40 mg by mouth at bedtime.   . benazepril (LOTENSIN) 40 MG tablet Take 1 tablet (40 mg total) by mouth daily.  . carvedilol (COREG) 25 MG tablet Take 1 tablet (25  mg total) by mouth 2 (two) times daily with a meal.  . ferrous sulfate 325 (65 FE) MG tablet Take 325 mg by mouth daily.  . hydrochlorothiazide (HYDRODIURIL) 25 MG tablet Take 1 tablet (25 mg total) by mouth daily.  . nabumetone (RELAFEN) 500 MG tablet Take 500 mg by mouth 2 (two) times daily.  . nitroGLYCERIN (NITROSTAT) 0.4 MG SL tablet Place 0.4 mg under the tongue every 5 (five) minutes x 3 doses as needed for chest pain.   . Omega-3 Fatty Acids (OMEGA 3 PO) Take 520 mg by mouth daily.  Marland Kitchen omeprazole (PRILOSEC) 20 MG capsule Take 20 mg by mouth daily.   . potassium chloride (KLOR-CON) 10 MEQ tablet TAKE 1 TABLET BY MOUTH EVERY DAY  . Probiotic Product (PROBIOTIC DAILY PO) Take 1 tablet by mouth daily.  . timolol (TIMOPTIC) 0.5 % ophthalmic solution Place 1 drop into both eyes  2 (two) times daily.   Marland Kitchen triamcinolone cream (KENALOG) 0.1 % Apply 1 application topically daily as needed (skin irritation).      Allergies:   Hydrocodone   Social History   Socioeconomic History  . Marital status: Married    Spouse name: Not on file  . Number of children: 2  . Years of education: some college  . Highest education level: Not on file  Occupational History  . Occupation: retired Social research officer, government  Tobacco Use  . Smoking status: Never Smoker  . Smokeless tobacco: Never Used  Vaping Use  . Vaping Use: Never used  Substance and Sexual Activity  . Alcohol use: No  . Drug use: No  . Sexual activity: Yes  Other Topics Concern  . Not on file  Social History Narrative   Lives at home with his wife.   Right-handed.   Caffeine use:  32-40 ounces caffeine per day.   Social Determinants of Health   Financial Resource Strain:   . Difficulty of Paying Living Expenses: Not on file  Food Insecurity:   . Worried About Charity fundraiser in the Last Year: Not on file  . Ran Out of Food in the Last Year: Not on file  Transportation Needs:   . Lack of Transportation (Medical): Not on file  . Lack of Transportation (Non-Medical): Not on file  Physical Activity:   . Days of Exercise per Week: Not on file  . Minutes of Exercise per Session: Not on file  Stress:   . Feeling of Stress : Not on file  Social Connections:   . Frequency of Communication with Friends and Family: Not on file  . Frequency of Social Gatherings with Friends and Family: Not on file  . Attends Religious Services: Not on file  . Active Member of Clubs or Organizations: Not on file  . Attends Archivist Meetings: Not on file  . Marital Status: Not on file     Family History: The patient's family history includes Breast cancer in his mother; Cirrhosis in his father; Colon cancer in his maternal aunt. There is no history of Esophageal cancer. ROS:   Please see the history of present illness.      All 14 point review of systems negative except as described per history of present illness  EKGs/Labs/Other Studies Reviewed:      Recent Labs: 03/16/2019: TSH 0.643 08/10/2019: NT-Pro BNP 1,606 12/18/2019: ALT 19; BUN 28; Creatinine 0.8; Hemoglobin 13.5; Platelets 226; Potassium 3.6; Sodium 143  Recent Lipid Panel No results found for: CHOL, TRIG, HDL, CHOLHDL,  VLDL, LDLCALC, LDLDIRECT  Physical Exam:    VS:  BP 140/70 (BP Location: Left Arm, Patient Position: Sitting, Cuff Size: Normal)   Pulse 88   Ht 5' 9" (1.753 m)   Wt 193 lb (87.5 kg)   SpO2 96%   BMI 28.50 kg/m     Wt Readings from Last 3 Encounters:  12/22/19 193 lb (87.5 kg)  12/18/19 187 lb 12.8 oz (85.2 kg)  09/22/19 184 lb 9.6 oz (83.7 kg)     GEN:  Well nourished, well developed in no acute distress HEENT: Normal NECK: No JVD; No carotid bruits LYMPHATICS: No lymphadenopathy CARDIAC: Irregular irregular, no murmurs, no rubs, no gallops RESPIRATORY:  Clear to auscultation without rales, wheezing or rhonchi  ABDOMEN: Soft, non-tender, non-distended MUSCULOSKELETAL:  No edema; No deformity  SKIN: Warm and dry LOWER EXTREMITIES: no swelling NEUROLOGIC:  Alert and oriented x 3 PSYCHIATRIC:  Normal affect   ASSESSMENT:    1. Left ventricular diastolic dysfunction   2. Primary hypertension   3. Coronary artery disease involving native coronary artery of native heart without angina pectoris   4. Permanent atrial fibrillation ([place])   5. OSA on CPAP    PLAN:    In order of problems listed above:  1. Left ventricle diastolic dysfunction.  Noted.  Work-up for amyloidosis is pending. 2. Primary/essential hypertension.  His blood pressure slightly on the higher side today but he check his blood pressure at home is always good. 3. Remote coronary artery disease no reactivation of the problem.  Stable without any symptoms will continue risk factors modifications. 4. Permanent atrial fibrillation rate controlled  anticoagulated which I will continue. 5. Obstructive sleep apnea on CPAP managed by internal medicine team. 6. Dyslipidemia he is on Lipitor 40 last K PN show me his LDL of 60 and HDL of 44.  We will continue present management. 7. Rule out amyloidosis.  He is getting ready to have his bone marrow biopsy done today and then find tissue biopsy within the next few day.  Followed by oncology.  I did need review note by oncologist/hematologist Dr. Bobby Rumpf, and we did also talk on the phone about the case medication Adjustments/Labs and Tests Ordered: Current medicines are reviewed at length with the patient today.  Concerns regarding medicines are outlined above.  No orders of the defined types were placed in this encounter.  Medication changes: No orders of the defined types were placed in this encounter.   Signed, Park Liter, MD, [facility] 12/22/2019 8:29 AM    [place]

## 2019-12-22 NOTE — Patient Instructions (Signed)
Medication Instructions:  Your physician recommends that you continue on your current medications as directed. Please refer to the Current Medication list given to you today.  *If you need a refill on your cardiac medications before your next appointment, please call your pharmacy*   Lab Work: None If you have labs (blood work) drawn today and your tests are completely normal, you will receive your results only by: MyChart Message (if you have MyChart) OR A paper copy in the mail If you have any lab test that is abnormal or we need to change your treatment, we will call you to review the results.   Testing/Procedures: none   Follow-Up: At [facility], you and your health needs are our priority.  As part of our continuing mission to provide you with exceptional heart care, we have created designated Provider Care Teams.  These Care Teams include your primary Cardiologist (physician) and Advanced Practice Providers (APPs -  Physician Assistants and Nurse Practitioners) who all work together to provide you with the care you need, when you need it.  We recommend signing up for the patient portal called "MyChart".  Sign up information is provided on this After Visit Summary.  MyChart is used to connect with patients for Virtual Visits (Telemedicine).  Patients are able to view lab/test results, encounter notes, upcoming appointments, etc.  Non-urgent messages can be sent to your provider as well.   To learn more about what you can do with MyChart, go to [email or web address].com.    Your next appointment:   3 month(s)  The format for your next appointment:   In Person  Provider:   Robert Krasowski, MD   Other Instructions   

## 2019-12-25 ENCOUNTER — Encounter: Payer: Self-pay | Admitting: Oncology

## 2019-12-25 NOTE — Telephone Encounter (Signed)
Reached out to patient and informed him I will have to call [place] tomorrow and talk to the RT for help.

## 2019-12-26 DIAGNOSIS — E65 Localized adiposity: Secondary | ICD-10-CM | POA: Diagnosis not present

## 2019-12-26 DIAGNOSIS — E859 Amyloidosis, unspecified: Secondary | ICD-10-CM | POA: Diagnosis not present

## 2019-12-26 NOTE — Telephone Encounter (Signed)
CMN received from [facility] and faxed to [facility] to be signed by Dr Claiborne Billings for patients new cpap machine. Hayley will fax paperwork to [place].

## 2020-01-01 NOTE — Telephone Encounter (Signed)
Order signed by Dr. Claiborne Billings and faxed to [facility].

## 2020-01-03 ENCOUNTER — Telehealth: Payer: Self-pay

## 2020-01-03 DIAGNOSIS — E782 Mixed hyperlipidemia: Secondary | ICD-10-CM | POA: Diagnosis not present

## 2020-01-03 DIAGNOSIS — I10 Essential (primary) hypertension: Secondary | ICD-10-CM | POA: Diagnosis not present

## 2020-01-03 DIAGNOSIS — I2510 Atherosclerotic heart disease of native coronary artery without angina pectoris: Secondary | ICD-10-CM | POA: Diagnosis not present

## 2020-01-03 DIAGNOSIS — R7303 Prediabetes: Secondary | ICD-10-CM | POA: Diagnosis not present

## 2020-01-03 NOTE — Telephone Encounter (Signed)
I spoke w/pt @ 1145, & notified him of below. Pt states, "That's what I thought. I'll see you on Friday @ 2 pm".    Pt LVM on triage line asking for return call @ [phone].  I called pt back. Pt tells me that he still has a lot of bruising, about 6 inches in length @ bone marrow bx site. Describes as black & blue, & sore. No bright red bleeding noted, or oozing of blood. Pt is on Eliquis BID. Pt asking if he needs to come in to let Dr Bobby Rumpf see it before his appt on Friday?  I notified Dr Bobby Rumpf of above. His recommendation is he will check the area at the regular scheduled appt. [phone]

## 2020-01-04 ENCOUNTER — Other Ambulatory Visit: Payer: Self-pay | Admitting: Oncology

## 2020-01-04 NOTE — Progress Notes (Signed)
Spencer Cochran  [address] [place],  [place]  [address] [id number]  Clinic Day:  01/05/2020  Referring physician: Maryella Shivers, MD   HISTORY OF PRESENT ILLNESS:  The patient is a 74 y.o. male  who I recently began seeing for possible amyloidosis.  He recently underwent an abdominal fat pad biopsy to determine if this disease is present.  He was also scheduled to undergo a bone marrow biopsy to check for amyloidosis, which he has been unable to undergo yet.  He comes in today to go over his fat pad biopsy results.  Overall, the patient is doing well.  He denies having any significant changes in his health since his last visit.  PHYSICAL EXAM:  Blood pressure (!) 143/74, pulse 70, temperature 97.8 F (36.6 C), resp. rate 16, height _0  (1.753 m), weight 189 lb 14.4 oz (86.1 kg), SpO2 100 %. Wt Readings from Last 3 Encounters:  01/05/20 189 lb 14.4 oz (86.1 kg)  12/22/19 193 lb (87.5 kg)  12/18/19 187 lb 12.8 oz (85.2 kg)   Body mass index is 28.04 kg/m. Performance status (ECOG): 1 Physical Exam Constitutional:      Appearance: Normal appearance. He is not ill-appearing.  HENT:     Mouth/Throat:     Mouth: Mucous membranes are moist.     Pharynx: Oropharynx is clear. No oropharyngeal exudate or posterior oropharyngeal erythema.  Cardiovascular:     Rate and Rhythm: Normal rate and regular rhythm.     Heart sounds: No murmur heard.  No friction rub. No gallop.   Pulmonary:     Effort: Pulmonary effort is normal. No respiratory distress.     Breath sounds: Normal breath sounds. No wheezing, rhonchi or rales.  Abdominal:     General: Bowel sounds are normal. There is no distension.     Palpations: Abdomen is soft. There is no mass.     Tenderness: There is no abdominal tenderness.  Musculoskeletal:        General: No swelling.     Right lower leg: No edema.     Left lower leg: No edema.  Lymphadenopathy:     Cervical: No cervical  adenopathy.     Upper Body:     Right upper body: No supraclavicular or axillary adenopathy.     Left upper body: No supraclavicular or axillary adenopathy.     Lower Body: No right inguinal adenopathy. No left inguinal adenopathy.  Skin:    General: Skin is warm.     Coloration: Skin is not jaundiced.     Findings: No lesion or rash.  Neurological:     General: No focal deficit present.     Mental Status: He is alert and oriented to person, place, and time. Mental status is at baseline.     Cranial Nerves: Cranial nerves are intact.  Psychiatric:        Mood and Affect: Mood normal.        Behavior: Behavior normal.        Thought Content: Thought content normal.     STUDIES:  His abdominal fat pad biopsy did not reveal any evidence of amyloidosis.  ASSESSMENT & PLAN:  A 74 y.o. male who I recently began seeing for amyloidosis possibly being present.  Understandably, the patient was pleased to find out his abdominal fat pat biopsy came back negative for amyloidosis.  I did speak with cardiology, who recommended that his bone marrow biopsy be done to  definitively rule out amyloidosis.  His bone marrow will be done in the forthcoming week.  I will see him back a week later to go over his bone marrow biopsy results and their implications.  The patient understands all the plans discussed today and is in agreement with them.   Maryagnes Carrasco Macarthur Critchley, MD

## 2020-01-05 ENCOUNTER — Telehealth: Payer: Self-pay | Admitting: Oncology

## 2020-01-05 ENCOUNTER — Inpatient Hospital Stay: Payer: Medicare Other | Attending: Oncology | Admitting: Oncology

## 2020-01-05 ENCOUNTER — Other Ambulatory Visit: Payer: Self-pay

## 2020-01-05 ENCOUNTER — Encounter: Payer: Self-pay | Admitting: Oncology

## 2020-01-05 VITALS — BP 143/74 | HR 70 | Temp 97.8°F | Resp 16 | Ht 69.0 in | Wt 189.9 lb

## 2020-01-05 DIAGNOSIS — R768 Other specified abnormal immunological findings in serum: Secondary | ICD-10-CM | POA: Diagnosis not present

## 2020-01-05 DIAGNOSIS — E859 Amyloidosis, unspecified: Secondary | ICD-10-CM

## 2020-01-05 NOTE — Telephone Encounter (Signed)
Per 12/3 los Bone marrow biopsy sched on 12/6.1wk f/u.All appts given to patient

## 2020-01-07 NOTE — Progress Notes (Signed)
BONE MARROW PROCEDURE NOTE Due to the concern about this patient potentially having amyloidosis, a bone marrow biopsy was done today for further evaluation.  After consenting for the procedure, the patient's left posterior superior iliac crest was topically sterilized.  Afterwards, a bone marrow core and aspirate were collected.  There were no complications experienced with this procedure.

## 2020-01-07 NOTE — Patient Instructions (Signed)
ee

## 2020-01-08 ENCOUNTER — Other Ambulatory Visit: Payer: Self-pay

## 2020-01-08 ENCOUNTER — Encounter: Payer: Self-pay | Admitting: Oncology

## 2020-01-08 ENCOUNTER — Inpatient Hospital Stay (INDEPENDENT_AMBULATORY_CARE_PROVIDER_SITE_OTHER): Payer: Medicare Other | Admitting: Oncology

## 2020-01-08 ENCOUNTER — Telehealth: Payer: Self-pay | Admitting: Oncology

## 2020-01-08 ENCOUNTER — Inpatient Hospital Stay: Payer: Medicare Other

## 2020-01-08 VITALS — BP 139/82 | HR 89 | Temp 97.9°F | Resp 16 | Wt 187.6 lb

## 2020-01-08 DIAGNOSIS — D6489 Other specified anemias: Secondary | ICD-10-CM | POA: Diagnosis not present

## 2020-01-08 DIAGNOSIS — E859 Amyloidosis, unspecified: Secondary | ICD-10-CM | POA: Diagnosis not present

## 2020-01-08 DIAGNOSIS — D649 Anemia, unspecified: Secondary | ICD-10-CM | POA: Diagnosis not present

## 2020-01-08 DIAGNOSIS — Z0001 Encounter for general adult medical examination with abnormal findings: Secondary | ICD-10-CM | POA: Diagnosis not present

## 2020-01-08 DIAGNOSIS — R768 Other specified abnormal immunological findings in serum: Secondary | ICD-10-CM | POA: Diagnosis not present

## 2020-01-08 NOTE — Telephone Encounter (Signed)
Per 12/6 los next appt given to patient.

## 2020-01-10 LAB — SURGICAL PATHOLOGY

## 2020-01-16 ENCOUNTER — Encounter: Payer: Self-pay | Admitting: Oncology

## 2020-01-16 ENCOUNTER — Other Ambulatory Visit: Payer: Self-pay | Admitting: Cardiology

## 2020-01-16 NOTE — Progress Notes (Signed)
[facility]  [address] [place],  [place]  [address] [phone]  Clinic Day:  01/17/2020  Referring physician: Maryella Shivers, MD   HISTORY OF PRESENT ILLNESS:  The patient is a 74 y.o. male  who I recently began seeing for possible amyloidosis.  He recently underwent an abdominal fat pad biopsy, which showed no evidence of amyloid being present.  He now comes in today to go over his bone marrow biopsy results to determine if amyloid may be present per this procedure.  Overall, the patient has been doing okay.  He has had diarrhea over the past few days.  Otherwise, he denies having any significant changes in his health since his last visit.  PHYSICAL EXAM:  Temperature 98.3 F (36.8 C), resp. rate 16, height _0  (1.753 m), weight 182 lb 9.6 oz (82.8 kg), SpO2 93 %. Wt Readings from Last 3 Encounters:  01/17/20 182 lb 9.6 oz (82.8 kg)  01/08/20 187 lb 9.6 oz (85.1 kg)  01/05/20 189 lb 14.4 oz (86.1 kg)   Body mass index is 26.97 kg/m. Performance status (ECOG): 1 Physical Exam Constitutional:      Appearance: Normal appearance. He is not ill-appearing.  HENT:     Mouth/Throat:     Mouth: Mucous membranes are moist.     Pharynx: Oropharynx is clear. No oropharyngeal exudate or posterior oropharyngeal erythema.  Cardiovascular:     Rate and Rhythm: Normal rate and regular rhythm.     Heart sounds: No murmur heard. No friction rub. No gallop.   Pulmonary:     Effort: Pulmonary effort is normal. No respiratory distress.     Breath sounds: Normal breath sounds. No wheezing, rhonchi or rales.  Chest:  Breasts:     Right: No axillary adenopathy or supraclavicular adenopathy.     Left: No axillary adenopathy or supraclavicular adenopathy.    Abdominal:     General: Bowel sounds are normal. There is no distension.     Palpations: Abdomen is soft. There is no mass.     Tenderness: There is no abdominal tenderness.  Musculoskeletal:         General: No swelling.     Right lower leg: No edema.     Left lower leg: No edema.  Lymphadenopathy:     Cervical: No cervical adenopathy.     Upper Body:     Right upper body: No supraclavicular or axillary adenopathy.     Left upper body: No supraclavicular or axillary adenopathy.     Lower Body: No right inguinal adenopathy. No left inguinal adenopathy.  Skin:    General: Skin is warm.     Coloration: Skin is not jaundiced.     Findings: No lesion or rash.  Neurological:     General: No focal deficit present.     Mental Status: He is alert and oriented to person, place, and time. Mental status is at baseline.     Cranial Nerves: Cranial nerves are intact.  Psychiatric:        Mood and Affect: Mood normal.        Behavior: Behavior normal.        Thought Content: Thought content normal.     STUDIES:  His bone marrow biopsy results revealed the following DIAGNOSIS:   BONE MARROW, ASPIRATE, CLOT, CORE:  -Limited bone marrow material with trilineage hematopoiesis  -See comment   PERIPHERAL BLOOD:  -Mild normocytic-normochromic anemia   COMMENT:   The bone  marrow material is limited but shows trilineage hematopoiesis.  There is no morphologic evidence of a lymphoproliferative disorder or  plasma cell neoplasm. No amyloid deposits are seen. Correlation with  cytogenetic studies is recommended   MICROSCOPIC DESCRIPTION:   PERIPHERAL BLOOD SMEAR: The red blood cells display mild  anisopoikilocytosis with mild polychromasia. The white blood cells are  normal in number with no significant morphologic abnormalities. The  platelets are normal in number.   BONE MARROW ASPIRATE: The material is markedly hemodiluted with lack of  optimal bone marrow particles. The following evaluation was performed  on limited touch imprints  Erythroid precursors: Progressive maturation with occasional late  precursors displaying nuclear cytoplasmic dyssynchrony  Granulocytic  precursors: Apparently orderly and progressive maturation  Megakaryocytes: Scattered forms present  Lymphocytes/plasma cells: Large aggregates not present   TOUCH PREPARATIONS: See above   CLOT AND BIOPSY: No clot sections are available for review. The core  biopsy is small but shows an estimated 40 to 50% cellularity with a  mixture of myeloid cell types. Large clusters or sheets of plasma cells  are not identified. Significant lymphoid aggregates or granulomata are  not seen. Immunohistochemical stains for CD34, CD138 and in situ  hybridization for kappa lambda were performed on block C1 with  appropriate controls. CD138 highlights a minor plasma cell component  consisting of scattered interstitial cells and occasional small clusters  composed with few cells and displays polyclonal staining pattern for  kappa and lambda light chains. CD34 only highlights scattering of  positive mononuclear cells with lack of clusters. In addition, a [place]  red stain was performed on block C1 with appropriate control and is  negative for amyloid deposits.   IRON STAIN: Iron stains are performed on a bone marrow aspirate or touch  imprint smear and section of clot. The controls stained appropriately.     Storage Iron: Difficult to assess due to limited material    Ring Sideroblasts: Absent in scattered erythroid precursors  evaluated   ADDITIONAL DATA/TESTING: The specimen was sent for cytogenetic analysis  and a separate report will follow   CELL COUNT DATA:   Bone Marrow count performed on 500 cells performed on touch imprints  shows:  Blasts:  1%  Myeloid:    40%  Promyelocytes: 0%  Erythroid:     30%  Myelocytes:  7%  Lymphocytes:    22%  Metamyelocytes:   0%  Plasma cells:    6%  Bands:  12%  Neutrophils:  17% M:E ratio:     1.3  Eosinophils:  2%  Basophils:   1%  Monocytes:   2%   Lab Data: CBC performed on 01/08/2020 shows:  WBC:  5.5 k/uL Neutrophils:  45%  Hgb: 13.6 g/dL Lymphocytes:  45%  HCT: 39.8 %  Monocytes:   6%  MCV: 99.0 fL  Eosinophils:  4%  RDW: 12.8 %  Basophils:   0%  PLT: 183 k/uL   ASSESSMENT & PLAN:  A 74 y.o. male who I recently began seeing for the possibility of amyloidosis.  However, based upon his abdominal fat pad and bone marrow biopsies both coming back negative, it is highly unlikely amyloidosis is present.  Based upon these 2 negative studies, I will turn this gentleman's care back over to his cardiologists as they try to determine what else may be going on with respect to his cardiac abnormalities and dysfunction.  The patient understands all the plans discussed today and is in agreement with them.  Camary Sosa Macarthur Critchley, MD

## 2020-01-17 ENCOUNTER — Encounter: Payer: Self-pay | Admitting: Oncology

## 2020-01-17 ENCOUNTER — Other Ambulatory Visit: Payer: Self-pay

## 2020-01-17 ENCOUNTER — Inpatient Hospital Stay (INDEPENDENT_AMBULATORY_CARE_PROVIDER_SITE_OTHER): Payer: Medicare Other | Admitting: Oncology

## 2020-01-17 ENCOUNTER — Telehealth: Payer: Self-pay | Admitting: Oncology

## 2020-01-17 VITALS — Temp 98.3°F | Resp 16 | Ht 69.0 in | Wt 182.6 lb

## 2020-01-17 DIAGNOSIS — R768 Other specified abnormal immunological findings in serum: Secondary | ICD-10-CM

## 2020-01-17 DIAGNOSIS — E859 Amyloidosis, unspecified: Secondary | ICD-10-CM

## 2020-01-17 NOTE — Telephone Encounter (Signed)
Per 12/15 no other appts needed for f/u

## 2020-01-18 ENCOUNTER — Encounter (HOSPITAL_COMMUNITY): Payer: Self-pay | Admitting: Oncology

## 2020-01-19 ENCOUNTER — Encounter: Payer: Self-pay | Admitting: Oncology

## 2020-02-03 DIAGNOSIS — U071 COVID-19: Secondary | ICD-10-CM

## 2020-02-03 HISTORY — DX: COVID-19: U07.1

## 2020-02-05 DIAGNOSIS — I2510 Atherosclerotic heart disease of native coronary artery without angina pectoris: Secondary | ICD-10-CM | POA: Insufficient documentation

## 2020-02-07 ENCOUNTER — Telehealth (INDEPENDENT_AMBULATORY_CARE_PROVIDER_SITE_OTHER): Payer: Medicare PPO | Admitting: Cardiology

## 2020-02-07 ENCOUNTER — Ambulatory Visit: Payer: Medicare Other | Admitting: Gastroenterology

## 2020-02-07 VITALS — Ht 69.0 in | Wt 182.0 lb

## 2020-02-07 DIAGNOSIS — I519 Heart disease, unspecified: Secondary | ICD-10-CM

## 2020-02-07 DIAGNOSIS — I2510 Atherosclerotic heart disease of native coronary artery without angina pectoris: Secondary | ICD-10-CM

## 2020-02-07 DIAGNOSIS — G4733 Obstructive sleep apnea (adult) (pediatric): Secondary | ICD-10-CM

## 2020-02-07 DIAGNOSIS — E785 Hyperlipidemia, unspecified: Secondary | ICD-10-CM

## 2020-02-07 DIAGNOSIS — I4821 Permanent atrial fibrillation: Secondary | ICD-10-CM

## 2020-02-07 DIAGNOSIS — Z9989 Dependence on other enabling machines and devices: Secondary | ICD-10-CM

## 2020-02-07 MED ORDER — HYDROCHLOROTHIAZIDE 12.5 MG PO TABS: 12.5000 mg | ORAL_TABLET | Freq: Every day | ORAL | 1 refills | Status: DC

## 2020-02-07 NOTE — Patient Instructions (Signed)
Medication Instructions:  Your physician has recommended you make the following change in your medication:  DECREASE: hydrochlorothiazide  12.5 mg daily  *If you need a refill on your cardiac medications before your next appointment, please call your pharmacy*   Lab Work: None. If you have labs (blood work) drawn today and your tests are completely normal, you will receive your results only by: Marland Kitchen MyChart Message (if you have MyChart) OR . A paper copy in the mail If you have any lab test that is abnormal or we need to change your treatment, we will call you to review the results.   Testing/Procedures: None.   Follow-Up: At [facility], you and your health needs are our priority.  As part of our continuing mission to provide you with exceptional heart care, we have created designated Provider Care Teams.  These Care Teams include your primary Cardiologist (physician) and Advanced Practice Providers (APPs -  Physician Assistants and Nurse Practitioners) who all work together to provide you with the care you need, when you need it.  We recommend signing up for the patient portal called "MyChart".  Sign up information is provided on this After Visit Summary.  MyChart is used to connect with patients for Virtual Visits (Telemedicine).  Patients are able to view lab/test results, encounter notes, upcoming appointments, etc.  Non-urgent messages can be sent to your provider as well.   To learn more about what you can do with MyChart, go to [email or web address].    Your next appointment:   4 month(s)  The format for your next appointment:   In Person  Provider:   Gypsy Balsam, MD   Other Instructions

## 2020-02-07 NOTE — Addendum Note (Signed)
Addended by: Hazle Quant on: 02/07/2020 11:31 AM   Modules accepted: Orders

## 2020-02-07 NOTE — Progress Notes (Signed)
Virtual Visit via Telephone Note   This visit type was conducted due to national recommendations for restrictions regarding the COVID-19 Pandemic (e.g. social distancing) in an effort to limit this patient's exposure and mitigate transmission in our community.  Due to his co-morbid illnesses, this patient is at least at moderate risk for complications without adequate follow up.  This format is felt to be most appropriate for this patient at this time.  The patient did not have access to video technology/had technical difficulties with video requiring transitioning to audio format only (telephone).  All issues noted in this document were discussed and addressed.  No physical exam could be performed with this format.  Please refer to the patient's chart for his  consent to telehealth for [facility].  Evaluation Performed:  Follow-up visit  This visit type was conducted due to national recommendations for restrictions regarding the COVID-19 Pandemic (e.g. social distancing).  This format is felt to be most appropriate for this patient at this time.  All issues noted in this document were discussed and addressed.  No physical exam was performed (except for noted visual exam findings with Video Visits).  Please refer to the patient's chart (MyChart message for video visits and phone note for telephone visits) for the patient's consent to telehealth for [facility].  Date:  02/07/2020  ID: Spencer Cochran, DOB 22-Mar-1945, MRN [id number]   Patient Location: [place] [address]   Provider location:   [place] [facility]  PCP:  Spencer Shivers, MD  Cardiologist:  Spencer Campus, MD     Chief Complaint: I am doing better  History of Present Illness:    Spencer Cochran is a 75 y.o. male  who presents via audio/video conferencing for a telehealth visit today.  With past medical history significant for remote coronary artery disease, essential hypertension,  diastolic dysfunction.  We did have suspicion for potential amyloidosis.  Quite extensive evaluation has been done which included fat tissue biopsy which was negative for amyloidosis, he also had bone marrow done which showed no evidence of any issues.  He does have a phone visit with me today.  He is unable to establish EVD only.  The reason why we doing virtual visit is the fact that I was potentially exposed to COVID-19. [name] is doing well.  He describes some fatigue tiredness and shortness of breath.  He goes to gym on the regular basis 3-4 times a week and have no difficulty doing what he likes.  He overall feels stronger and better.   The patient does not have symptoms concerning for COVID-19 infection (fever, chills, cough, or new SHORTNESS OF BREATH).    Prior CV studies:   The following studies were reviewed today:       Past Medical History:  Diagnosis Date  . Arthritis knees and ankle  . Asymptomatic gallstones   . Atrial fibrillation ([place]) 03/08/2014  . Benign prostatic hypertrophy with incomplete bladder emptying 04/20/2013  . Biliary obstruction   . Cancer ([place])    skin   . Cataract immature BILATERAL EYES  . Choledocholithiasis   . Coronary artery disease CARDIOLOGIST-  Spencer Spencer Cochran  Tia Alert)---  LAST VISIT 04-29-2011   DENIES CARDIAC SYMPTOMS  . Coronary artery disease involving native coronary artery of native heart without angina pectoris 10/09/2014   Luminal disease but cardiac catheterization from 2014, stress test in the summer of 2018 showed no evidence of ischemia  Formatting of this note might  be different from the original. 20% circumflex 20% RCA in July 2014  . DDD (degenerative disc disease), lumbar   . Dyslipidemia 10/01/2016  . Dysrhythmia    A Fib   . Frequency of urination   . Gait abnormality 08/03/2018  . GERD (gastroesophageal reflux disease)   . Glaucoma    both eyes  . History of colon polyps   . History of kidney stones   . Hypertension   .  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 Left ventricular diastolic dysfunction PER ECHO 03-31-2011  W/ CHART  . Nocturia   . Obstructive jaundice   . Obstructive sleep apnea 06/05/2019  . OSA on CPAP    wears cpap  . Palpitations 08/10/2019  . Paresthesia 07/18/2018  . Pre-diabetes    last hemaglobin a 1 c was 5.6  . Unsteadiness   . Urethral stricture     Past Surgical History:  Procedure Laterality Date  . APPENDECTOMY  1972  . BALLOON DILATION N/A 04/13/2019   Procedure: BALLOON DILATION;  Surgeon: Spencer Denmark, MD;  Location: [facility];  Service: Endoscopy;  Laterality: N/A;  . CARDIOVASCULAR STRESS TEST  04-29-2010   Spencer Spencer Cochran   NO EVIDENCE ISCHEMIA/ NORMAL LVSF AND WALL MOTION/ EF 63%  . CERVICAL FUSION  2006   C3 - 6  . COLONOSCOPY  03/23/2012   Small colonic polyps, status post polypectomy. Pancolonic diverticulosis predominantly in the left colon.Small internal and external hemorrhoids  . COLONOSCOPY  2019   Spencer Spencer Cochran. Diverticulosis. Doesn't think he removed polpys   . CYSTO/ URETHRAL DILATION/ TRANSURETHRAL INCISIONOF PROSTATE  01-13-2007  . CYSTOSCOPY  2005  . CYSTOSCOPY W/ RETROGRADES  06/12/2011   Procedure: CYSTOSCOPY WITH RETROGRADE PYELOGRAM;  Surgeon: Spencer Rud, MD;  Location: [facility];  Service: Urology;  Laterality: N/A;  cysto, urethral dilation, right retrograde pyelogram    . CYSTOSCOPY WITH RETROGRADE PYELOGRAM, URETEROSCOPY AND STENT PLACEMENT Left 03/08/2014   Procedure: CYSTOSCOPY WITH LEFT RETROGRADE PYELOGRAM/LEFT  URETEROSCOPY;  Surgeon: Spencer Rud, MD;  Location: [facility];  Service: Urology;  Laterality: Left;  . CYSTOSCOPY WITH URETHRAL DILATATION N/A 04/20/2013   Procedure: CYSTOSCOPY WITH URETHRAL DILATATION;  Surgeon: Spencer Rud, MD;  Location: [facility];  Service: Urology;  Laterality: N/A;  . CYSTOSCOPY/URETEROSCOPY/HOLMIUM LASER/STENT PLACEMENT Left 06/10/2017    Procedure: CYSTOSCOPY/RETROGRADE/URETEROSCOPY/HOLMIUM LASER/STENT PLACEMENT;  Surgeon: Spencer Bring, MD;  Location: [facility];  Service: Urology;  Laterality: Left;  . CYSTOSCOPY/URETEROSCOPY/HOLMIUM LASER/STENT PLACEMENT Right 03/09/2019   Procedure: CYSTOSCOPY/RETROGRADE/URETEROSCOPY/HOLMIUM LASER/STENT PLACEMENT;  Surgeon: Spencer Bring, MD;  Location: [facility];  Service: Urology;  Laterality: Right;  ONLY NEEDS 60 MIN  . ENDOSCOPIC RETROGRADE CHOLANGIOPANCREATOGRAPHY (ERCP) WITH PROPOFOL N/A 04/13/2019   Procedure: ENDOSCOPIC RETROGRADE CHOLANGIOPANCREATOGRAPHY (ERCP) WITH PROPOFOL;  Surgeon: Spencer Denmark, MD;  Location: [facility];  Service: Endoscopy;  Laterality: N/A;  . EXTRACORPOREAL SHOCK WAVE LITHOTRIPSY  01-17-2007   LEFT  . HERNIA REPAIR  01/30/2019   [facility] Left inguinal hernia repair  . HOLMIUM LASER APPLICATION Left [id number]   Procedure: LASER OF LEFT RENAL PELVIC STONE;  Surgeon: Spencer Rud, MD;  Location: [facility];  Service: Urology;  Laterality: Left;  . INGUINAL HERNIA REPAIR  2003  . JOINT REPLACEMENT     total knee right 03-01-17  . LEFT ACHILLES TENDON REPAIR  1992  . MASS EXCISION N/A 11/14/2018   Procedure: EXCISIONAL BIOPSY OF GLANS PENIS;  Surgeon: Spencer Bring,  MD;  Location: [facility];  Service: Urology;  Laterality: N/A;  ONLY NEEDS 60 MIN  . NASAL SINUS SURGERY  2012  . PENILE SURGERY  OF MEATUS  1955  . REMOVAL OF STONES  04/13/2019   Procedure: REMOVAL OF STONES;  Surgeon: Spencer Denmark, MD;  Location: [facility];  Service: Endoscopy;;  . [name]  04/13/2019   Procedure: [name];  Surgeon: Spencer Denmark, MD;  Location: [facility];  Service: Endoscopy;;  . TRANSTHORACIC ECHOCARDIOGRAM  03-31-2011   NORMAL LVEF  59%/ TRIVIAL MR/ DIASTOLIC DYSFUNCTION/ MODERATE LVH  . TRANSURETHRAL RESECTION OF PROSTATE N/A 04/20/2013   Procedure: TRANSURETHRAL RESECTION OF THE PROSTATE WITH GYRUS INSTRUMENTS;  Surgeon: Spencer Rud, MD;  Location: [facility];  Service: Urology;  Laterality: N/A;     Current Meds  Medication Sig  . apixaban (ELIQUIS) 5 MG TABS tablet Take 1 tablet (5 mg total) by mouth 2 (two) times daily.  Marland Kitchen atorvastatin (LIPITOR) 40 MG tablet Take 40 mg by mouth at bedtime.   . benazepril (LOTENSIN) 40 MG tablet TAKE ONE TABLET BY MOUTH EVERY DAY  . carvedilol (COREG) 25 MG tablet Take 1 tablet (25 mg total) by mouth 2 (two) times daily with a meal.  . ferrous sulfate 325 (65 FE) MG tablet Take 325 mg by mouth daily.  . hydrochlorothiazide (HYDRODIURIL) 25 MG tablet Take 1 tablet (25 mg total) by mouth daily.  . nabumetone (RELAFEN) 500 MG tablet Take 500 mg by mouth 2 (two) times daily.  . nitroGLYCERIN (NITROSTAT) 0.4 MG SL tablet Place 0.4 mg under the tongue every 5 (five) minutes x 3 doses as needed for chest pain.   . Omega-3 Fatty Acids (OMEGA 3 PO) Take 520 mg by mouth daily.  Marland Kitchen omeprazole (PRILOSEC) 20 MG capsule Take 20 mg by mouth daily.   . potassium chloride (KLOR-CON) 10 MEQ tablet TAKE 1 TABLET BY MOUTH EVERY DAY  . Probiotic Product (PROBIOTIC DAILY PO) Take 1 tablet by mouth daily.  . timolol (TIMOPTIC) 0.5 % ophthalmic solution Place 1 drop into both eyes 2 (two) times daily.   Marland Kitchen triamcinolone cream (KENALOG) 0.1 % Apply 1 application topically daily as needed (skin irritation).       Family History: The patient's family history includes Breast cancer in his mother; Cirrhosis in his father; Colon cancer in his maternal aunt. There is no history of Esophageal cancer.   ROS:   Please see the history of present illness.     All other systems reviewed and are negative.   Labs/Other Tests and Data Reviewed:     Recent Labs: 03/16/2019: TSH 0.643 08/10/2019: NT-Pro BNP 1,606 12/18/2019: ALT 19; BUN 28; Creatinine 0.8; Hemoglobin 13.5; Platelets 226; Potassium 3.6; Sodium 143  Recent Lipid Panel No results found for: CHOL, TRIG, HDL, CHOLHDL, VLDL, LDLCALC,  LDLDIRECT    Exam:    Vital Signs:  Ht 5\' 9"  (1.753 m)   Wt 182 lb (82.6 kg)   BMI 26.88 kg/m     Wt Readings from Last 3 Encounters:  02/07/20 182 lb (82.6 kg)  01/17/20 182 lb 9.6 oz (82.8 kg)  01/08/20 187 lb 9.6 oz (85.1 kg)     Well nourished, well developed in no acute distress. Alert awake currently x3 not in any distress denies have any chest pain tightness squeezing pressure burning chest.  Overall comfortable while talking to me  Diagnosis for this visit:   1. Left ventricular diastolic dysfunction   2. Coronary artery disease  involving native coronary artery of native heart without angina pectoris   3. Permanent atrial fibrillation (HCC)   4. OSA on CPAP   5. Dyslipidemia      ASSESSMENT & PLAN:    1.  Left ventricle diastolic dysfunction.  Appropriately managed with medications.  He reports to have some dizziness when he gets up quickly on top of that he also notices blood pressure being low sometimes.  I recommended cutting down his diuretic from 25 mg hydrochlorothiazide to only 12.5 mg daily.  I warned him about potential negative and effect of this maneuver which may include swelling of lower extremities ask him to let me know if it happens. 2.  Coronary disease stable denies having any symptomatology. 3.  Permanent atrial fibrillation anticoagulated which I will continue. 4.  Obstructive sleep apnea: That being managed by internal medicine team. 5.  Dyslipidemia: We will make arrangements for fasting lipid profile. 6.  Potential amyloidosis, luckily all testing came negative.  COVID-19 Education: The signs and symptoms of COVID-19 were discussed with the patient and how to seek care for testing (follow up with PCP or arrange E-visit).  The importance of social distancing was discussed today.  Patient Risk:   After full review of this patients clinical status, I feel that they are at least moderate risk at this time.  Time:   Today, I have spent 15  minutes with the patient with telehealth technology discussing pt health issues.  I spent 15 minutes reviewing her chart before the visit.  Visit was finished at 1121.    Medication Adjustments/Labs and Tests Ordered: Current medicines are reviewed at length with the patient today.  Concerns regarding medicines are outlined above.  No orders of the defined types were placed in this encounter.  Medication changes: No orders of the defined types were placed in this encounter.    Disposition: 4 months  Signed, Georgeanna Lea, MD, [facility] 02/07/2020 11:18 AM    [facility]

## 2020-02-14 NOTE — Telephone Encounter (Signed)
Hb is fine He did have covid which by itself can have thrombotic complications He must increase omeprazole to 20 mg p.o. twice daily Dark stools may also be because of iron supplements He should continue Eliquis for now Just observe for now Avoid nonsteroidals if possible including Relafen If any increased and dark stools or if he starts feeling dizzy, needs to come over to ED  RG

## 2020-02-21 ENCOUNTER — Ambulatory Visit: Payer: Medicare Other | Admitting: Sports Medicine

## 2020-02-23 ENCOUNTER — Encounter: Payer: Self-pay | Admitting: Gastroenterology

## 2020-02-23 ENCOUNTER — Other Ambulatory Visit (INDEPENDENT_AMBULATORY_CARE_PROVIDER_SITE_OTHER): Payer: Medicare PPO

## 2020-02-23 ENCOUNTER — Other Ambulatory Visit: Payer: Self-pay

## 2020-02-23 ENCOUNTER — Ambulatory Visit (INDEPENDENT_AMBULATORY_CARE_PROVIDER_SITE_OTHER): Payer: Medicare PPO | Admitting: Gastroenterology

## 2020-02-23 VITALS — BP 120/74 | HR 78 | Ht 69.0 in | Wt 182.0 lb

## 2020-02-23 DIAGNOSIS — K831 Obstruction of bile duct: Secondary | ICD-10-CM | POA: Diagnosis not present

## 2020-02-23 DIAGNOSIS — Z8719 Personal history of other diseases of the digestive system: Secondary | ICD-10-CM

## 2020-02-23 LAB — COMPREHENSIVE METABOLIC PANEL
ALT: 16 U/L (ref 0–53)
AST: 23 U/L (ref 0–37)
Albumin: 3.8 g/dL (ref 3.5–5.2)
Alkaline Phosphatase: 64 U/L (ref 39–117)
BUN: 23 mg/dL (ref 6–23)
CO2: 34 mEq/L — ABNORMAL HIGH (ref 19–32)
Calcium: 9.3 mg/dL (ref 8.4–10.5)
Chloride: 103 mEq/L (ref 96–112)
Creatinine, Ser: 0.84 mg/dL (ref 0.40–1.50)
GFR: 85.29 mL/min (ref >60.00)
Glucose, Bld: 96 mg/dL (ref 70–99)
Potassium: 4.1 mEq/L (ref 3.5–5.1)
Sodium: 141 mEq/L (ref 135–145)
Total Bilirubin: 0.4 mg/dL (ref 0.2–1.2)
Total Protein: 6.3 g/dL (ref 6.0–8.3)

## 2020-02-23 LAB — CBC WITH DIFFERENTIAL/PLATELET
Basophils Absolute: 0 10*3/uL (ref 0.0–0.1)
Basophils Relative: 0.2 % (ref 0.0–3.0)
Eosinophils Absolute: 0.1 10*3/uL (ref 0.0–0.7)
Eosinophils Relative: 1.7 % (ref 0.0–5.0)
HCT: 38.4 % — ABNORMAL LOW (ref 39.0–52.0)
Hemoglobin: 12.9 g/dL — ABNORMAL LOW (ref 13.0–17.0)
Lymphocytes Relative: 30.2 % (ref 12.0–46.0)
Lymphs Abs: 2.3 10*3/uL (ref 0.7–4.0)
MCHC: 33.6 g/dL (ref 30.0–36.0)
MCV: 98.8 fl (ref 78.0–100.0)
Monocytes Absolute: 0.5 10*3/uL (ref 0.1–1.0)
Monocytes Relative: 6.6 % (ref 3.0–12.0)
Neutro Abs: 4.8 10*3/uL (ref 1.4–7.7)
Neutrophils Relative %: 61.3 % (ref 43.0–77.0)
Platelets: 301 10*3/uL (ref 150.0–400.0)
RBC: 3.88 Mil/uL — ABNORMAL LOW (ref 4.22–5.81)
RDW: 13.1 % (ref 11.5–15.5)
WBC: 7.8 10*3/uL (ref 4.0–10.5)

## 2020-02-23 NOTE — Patient Instructions (Signed)
If you are age 76 or older, your body mass index should be between 23-30. Your Body mass index is 26.88 kg/m. If this is out of the aforementioned range listed, please consider follow up with your Primary Care Provider.  If you are age 5 or younger, your body mass index should be between 19-25. Your Body mass index is 26.88 kg/m. If this is out of the aformentioned range listed, please consider follow up with your Primary Care Provider.   Labs have been ordered for you today. Please go to the second floor to have them completed.  Follow up as needed.  Thank you for entrusting me with your care and choosing [facility].  Dr Jackquline Denmark

## 2020-02-23 NOTE — Progress Notes (Signed)
Please inform the patient. Hb at baseline. No further melena (was not melena but dark stools assoc with iron) So, no need for EGD at this time Send report to family physician

## 2020-02-23 NOTE — Progress Notes (Signed)
Chief Complaint:   Referring Provider:  Maryella Shivers, MD      ASSESSMENT AND PLAN;   #1. H/O ?Melena.  Was likely due to iron supplements.    #2. Choledocholithiasis s/p ERCP with biliary sphincterotomy with stone extraction 04/13/2019, now s/p cholecystectomy.  #3. Covid-19. Dx 04/12/2019, then again 02/2020.  #4.  Comorbid conditions include A Fib on eliquis (followed by Dr Raliegh Ip), CAD, OSA, HTN, pre-DM.  Plan: -Check CBC, CMP today. -Omeprazole 84m po qd to continue -If Hb low, then he would consider EGD off Eliquis. -Otherwise conservative management for now. -He will closely follow-up with Dr. FMaryella Shivers        HPI:    Spencer Roseteis a 75y.o. male  For follow-up visit.  Had Covid 19 infection again- + 3 week ago.  He is doing much better now except for some cough.  Previously was tested positive on 04/13/2019.  No further dark stools ever since he has stopped iron supplements.  No nausea, vomiting, heartburn, regurgitation, odynophagia or dysphagia.  He continues to be on Eliquis.  No abdominal pain.  No diarrhea or constipation.  He feels much better.   S/P ERCP with biliary sphincterotomy, sphincteroplasty, extraction of a large impacted stone in the ampulla on 04/13/2019.  Had laparoscopic cholecystectomy thereafter.  No jaundice dark urine or pale stools.  No melena or hematochezia.  No abdominal pain.   Past GI procedures: -Colonoscopy 2019: Dr BMelina Copa neg per patient except diverticulosis.  We do not have the report.  Previous colonoscopy 03/2012: Small tubular adenomas s/p polypectomy, pancolonic diverticulosis predominantly in the left colon, small internal and external hemorrhoids.  Past Medical History:  Diagnosis Date  . Arthritis knees and ankle  . Asymptomatic gallstones   . [place] 03/[place]2014  . Benign prostatic hypertrophy with incomplete bladder emptying 04/20/2013  . Biliary obstruction   . [place]     skin   . Cataract immature BILATERAL EYES  . Choledocholithiasis   . Coronary artery disease CARDIOLOGIST-  DR KAgustin Cree ([facility]---  LAST VISIT 04-29-2011   DENIES CARDIAC SYMPTOMS  . Coronary artery disease involving native coronary artery of native heart without angina pectoris 10/09/2014   Luminal disease but cardiac catheterization from 2014, stress test in the summer of 2018 showed no evidence of ischemia  Formatting of this note might be different from the original. 20% circumflex 20% RCA in July 2014  . COVID-19 virus infection 02/2020  . DDD (degenerative disc disease), lumbar   . Dyslipidemia 10/01/2016  . Dysrhythmia    A Fib   . Frequency of urination   . Gait abnormality 08/03/2018  . GERD (gastroesophageal reflux disease)   . Glaucoma    both eyes  . History of colon polyps   . History of kidney stones   . Hypertension   . 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 Left ventricular diastolic dysfunction PER ECHO 03-31-2011  W/ CHART  . Nocturia   . Obstructive jaundice   . Obstructive sleep apnea 06/05/2019  . OSA on CPAP    wears cpap  . Palpitations 08/10/2019  . Paresthesia 07/18/2018  . Pre-diabetes    last hemaglobin a 1 c was 5.6  . Unsteadiness   . Urethral stricture     Past Surgical History:  Procedure Laterality Date  . APPENDECTOMY  1972  . BALLOON DILATION N/A 04/13/2019   Procedure: BALLOON DILATION;  Surgeon:  Jackquline Denmark, MD;  Location: [facility];  Service: Endoscopy;  Laterality: N/A;  . CARDIOVASCULAR STRESS TEST  04-29-2010   DR Halford Chessman   NO EVIDENCE ISCHEMIA/ NORMAL LVSF AND WALL MOTION/ EF 63%  . CERVICAL FUSION  2006   C3 - 6  . COLONOSCOPY  03/23/2012   Small colonic polyps, status post polypectomy. Pancolonic diverticulosis predominantly in the left colon.Small internal and external hemorrhoids  . COLONOSCOPY  2019   Dr Orlena Sheldon. Diverticulosis. Doesn't think he removed polpys   . CYSTO/ URETHRAL DILATION/  TRANSURETHRAL INCISIONOF PROSTATE  01-13-2007  . CYSTOSCOPY  2005  . CYSTOSCOPY W/ RETROGRADES  06/12/2011   Procedure: CYSTOSCOPY WITH RETROGRADE PYELOGRAM;  Surgeon: Ailene Rud, MD;  Location: [facility];  Service: Urology;  Laterality: N/A;  cysto, urethral dilation, right retrograde pyelogram    . CYSTOSCOPY WITH RETROGRADE PYELOGRAM, URETEROSCOPY AND STENT PLACEMENT Left 03/08/2014   Procedure: CYSTOSCOPY WITH LEFT RETROGRADE PYELOGRAM/LEFT  URETEROSCOPY;  Surgeon: Ailene Rud, MD;  Location: [facility];  Service: Urology;  Laterality: Left;  . CYSTOSCOPY WITH URETHRAL DILATATION N/A 04/20/2013   Procedure: CYSTOSCOPY WITH URETHRAL DILATATION;  Surgeon: Ailene Rud, MD;  Location: [facility];  Service: Urology;  Laterality: N/A;  . CYSTOSCOPY/URETEROSCOPY/HOLMIUM LASER/STENT PLACEMENT Left 06/10/2017   Procedure: CYSTOSCOPY/RETROGRADE/URETEROSCOPY/HOLMIUM LASER/STENT PLACEMENT;  Surgeon: Raynelle Bring, MD;  Location: [facility];  Service: Urology;  Laterality: Left;  . CYSTOSCOPY/URETEROSCOPY/HOLMIUM LASER/STENT PLACEMENT Right 03/09/2019   Procedure: CYSTOSCOPY/RETROGRADE/URETEROSCOPY/HOLMIUM LASER/STENT PLACEMENT;  Surgeon: Raynelle Bring, MD;  Location: [facility];  Service: Urology;  Laterality: Right;  ONLY NEEDS 60 MIN  . ENDOSCOPIC RETROGRADE CHOLANGIOPANCREATOGRAPHY (ERCP) WITH PROPOFOL N/A 04/13/2019   Procedure: ENDOSCOPIC RETROGRADE CHOLANGIOPANCREATOGRAPHY (ERCP) WITH PROPOFOL;  Surgeon: Jackquline Denmark, MD;  Location: [facility];  Service: Endoscopy;  Laterality: N/A;  . EXTRACORPOREAL SHOCK WAVE LITHOTRIPSY  01-17-2007   LEFT  . HERNIA REPAIR  01/30/2019   [facility] Left inguinal hernia repair  . HOLMIUM LASER APPLICATION Left 02/06/5206   Procedure: LASER OF LEFT RENAL PELVIC STONE;  Surgeon: Ailene Rud, MD;  Location: [facility];  Service: Urology;  Laterality: Left;  . INGUINAL HERNIA REPAIR  2003  . JOINT REPLACEMENT      total knee right 03-01-17  . LEFT ACHILLES TENDON REPAIR  1992  . MASS EXCISION N/A 11/14/2018   Procedure: EXCISIONAL BIOPSY OF GLANS PENIS;  Surgeon: Raynelle Bring, MD;  Location: [facility];  Service: Urology;  Laterality: N/A;  ONLY NEEDS 60 MIN  . NASAL SINUS SURGERY  2012  . PENILE SURGERY  OF MEATUS  1955  . REMOVAL OF STONES  04/13/2019   Procedure: REMOVAL OF STONES;  Surgeon: Jackquline Denmark, MD;  Location: [facility];  Service: Endoscopy;;  . [name]  04/13/2019   Procedure: [name];  Surgeon: Jackquline Denmark, MD;  Location: [facility];  Service: Endoscopy;;  . TRANSTHORACIC ECHOCARDIOGRAM  03-31-2011   NORMAL LVEF  59%/ TRIVIAL MR/ DIASTOLIC DYSFUNCTION/ MODERATE LVH  . TRANSURETHRAL RESECTION OF PROSTATE N/A 04/20/2013   Procedure: TRANSURETHRAL RESECTION OF THE PROSTATE WITH GYRUS INSTRUMENTS;  Surgeon: Ailene Rud, MD;  Location: [facility];  Service: Urology;  Laterality: N/A;    Family History  Problem Relation Age of Onset  . Breast cancer Mother   . Cirrhosis Father   . Colon cancer Maternal Aunt        died in 66's   . Esophageal cancer Neg Hx     Social History   Tobacco Use  .  Smoking status: Never Smoker  . Smokeless tobacco: Never Used  Vaping Use  . Vaping Use: Never used  Substance Use Topics  . Alcohol use: No  . Drug use: No    Current Outpatient Medications  Medication Sig Dispense Refill  . apixaban (ELIQUIS) 5 MG TABS tablet Take 1 tablet (5 mg total) by mouth 2 (two) times daily. 180 tablet 3  . atorvastatin (LIPITOR) 40 MG tablet Take 40 mg by mouth at bedtime.     . benazepril (LOTENSIN) 40 MG tablet TAKE ONE TABLET BY MOUTH EVERY DAY 90 tablet 2  . carvedilol (COREG) 25 MG tablet Take 1 tablet (25 mg total) by mouth 2 (two) times daily with a meal. 180 tablet 1  . ferrous sulfate 325 (65 FE) MG tablet Take 325 mg by mouth daily.    . hydrochlorothiazide (HYDRODIURIL) 12.5 MG tablet Take 1 tablet (12.5 mg total) by mouth  daily. 90 tablet 1  . nabumetone (RELAFEN) 500 MG tablet Take 500 mg by mouth as needed.    . Omega-3 Fatty Acids (OMEGA 3 PO) Take 520 mg by mouth daily.    Marland Kitchen omeprazole (PRILOSEC) 20 MG capsule Take 20 mg by mouth daily.     . potassium chloride (KLOR-CON) 10 MEQ tablet TAKE 1 TABLET BY MOUTH EVERY DAY 90 tablet 3  . Probiotic Product (PROBIOTIC DAILY PO) Take 1 tablet by mouth daily.    . timolol (TIMOPTIC) 0.5 % ophthalmic solution Place 1 drop into both eyes 2 (two) times daily.     Marland Kitchen triamcinolone cream (KENALOG) 0.1 % Apply 1 application topically daily as needed (skin irritation).     . nitroGLYCERIN (NITROSTAT) 0.4 MG SL tablet Place 0.4 mg under the tongue every 5 (five) minutes x 3 doses as needed for chest pain.  (Patient not taking: Reported on 02/23/2020)     No current facility-administered medications for this visit.    Allergies  Allergen Reactions  . Hydrocodone Other (See Comments)    insomnia "awake for 48 hours"    Review of Systems:  neg     Physical Exam:    BP 120/74   Pulse 78   Ht 5' 9"  (1.753 m)   Wt 182 lb (82.6 kg) Comment: verbalized  BMI 26.88 kg/m  Wt Readings from Last 3 Encounters:  02/23/20 182 lb (82.6 kg)  02/07/20 182 lb (82.6 kg)  01/17/20 182 lb 9.6 oz (82.8 kg)   Gen: awake, alert, NAD HEENT: anicteric, no pallor CV: RRR, no mrg Pulm: CTA b/l Abd: soft, NT/ND, +BS throughout Ext: no c/c/e Neuro: nonfocal   Data Reviewed: I have personally reviewed following labs and imaging studies  CBC: CBC Latest Ref Rng & Units 12/18/2019 04/11/2019 03/16/2019  WBC - 5.7 5.5 6.9  Hemoglobin 13.5 - 17.5 13.5 12.9(L) 12.4(L)  Hematocrit 41 - 53 40(A) 37.9(L) 36.9(L)  Platelets 150 - 399 226 211.0 253    CMP: CMP Latest Ref Rng & Units 12/18/2019 08/10/2019 06/05/2019  Glucose 65 - 99 mg/dL - 86 93  BUN 4 - 21 28(A) 15 14  Creatinine 0.6 - 1.3 0.8 0.80 0.74(L)  Sodium 137 - 147 143 143 144  Potassium 3.4 - 5.3 3.6 4.8 4.1  Chloride 99 -  108 106 104 106  CO2 13 - 22 32(A) 29 26  Calcium 8.7 - 10.7 9.4 9.2 9.2  Total Protein 6.0 - 8.5 g/dL - - 6.5  Total Bilirubin 0.0 - 1.2 mg/dL - - 0.7  Alkaline Phos 25 - 125 58 - 109  AST 14 - 40 39 - 37  ALT 10 - 40 19 - 19   Hepatic Function Latest Ref Rng & Units 12/18/2019 06/05/2019 04/11/2019  Total Protein 6.0 - 8.5 g/dL - 6.5 6.6  Albumin 3.5 - 5.0 3.7 3.7 2.9(L)  AST 14 - 40 39 37 86(H)  ALT 10 - 40 19 19 44  Alk Phosphatase 25 - 125 58 109 798(H)  Total Bilirubin 0.0 - 1.2 mg/dL - 0.7 2.0(H)     Carmell Austria, MD 02/23/2020, 10:47 AM  Cc: Maryella Shivers, MD

## 2020-02-29 ENCOUNTER — Telehealth: Payer: Self-pay

## 2020-02-29 NOTE — Telephone Encounter (Signed)
Faxed requested office notes and studies to assist with coverage to Tarri Glenn from [facility] .Marland Kitchen Fax [phone]

## 2020-03-13 ENCOUNTER — Encounter: Payer: Self-pay | Admitting: Sports Medicine

## 2020-03-13 ENCOUNTER — Ambulatory Visit (INDEPENDENT_AMBULATORY_CARE_PROVIDER_SITE_OTHER): Payer: Medicare PPO | Admitting: Sports Medicine

## 2020-03-13 ENCOUNTER — Other Ambulatory Visit: Payer: Self-pay

## 2020-03-13 ENCOUNTER — Ambulatory Visit: Payer: Medicare PPO | Admitting: Orthotics

## 2020-03-13 DIAGNOSIS — I739 Peripheral vascular disease, unspecified: Secondary | ICD-10-CM | POA: Diagnosis not present

## 2020-03-13 DIAGNOSIS — B351 Tinea unguium: Secondary | ICD-10-CM

## 2020-03-13 DIAGNOSIS — M79675 Pain in left toe(s): Secondary | ICD-10-CM | POA: Diagnosis not present

## 2020-03-13 DIAGNOSIS — E119 Type 2 diabetes mellitus without complications: Secondary | ICD-10-CM

## 2020-03-13 DIAGNOSIS — M79676 Pain in unspecified toe(s): Secondary | ICD-10-CM

## 2020-03-13 DIAGNOSIS — M79674 Pain in right toe(s): Secondary | ICD-10-CM

## 2020-03-13 NOTE — Progress Notes (Signed)
Refurb f/o with 4* RF varus post b/l

## 2020-03-13 NOTE — Progress Notes (Signed)
Subjective: Spencer Cochran is a 75 y.o. male patient seen today in office with complaint of mildly painful thickened and elongated toenails; unable to trim. Patient is still on Eliquis blood thinner for history of Cardiac/ A Fib like before with no changes reports that his blood sugar 1 week ago was 161 last A1c 5.6 and PCP visit on Thursday. No other issues noted.  Patient Active Problem List   Diagnosis Date Noted  . Coronary artery disease   . Unsteadiness   . Pre-diabetes   . OSA on CPAP   . Nocturia   . Left ventricular diastolic dysfunction   . Impaired hearing   . Hypertension   . History of kidney stones   . History of colon polyps   . Frequency of urination   . Dysrhythmia   . GERD (gastroesophageal reflux disease)   . DDD (degenerative disc disease), lumbar   . Cancer ([place])   . Asymptomatic gallstones   . Arthritis   . Palpitations 08/10/2019  . Obstructive sleep apnea 06/05/2019  . Obstructive jaundice   . Choledocholithiasis   . Biliary obstruction   . Gait abnormality 08/03/2018  . Paresthesia 07/18/2018  . Dyslipidemia 10/01/2016  . Coronary artery disease involving native coronary artery of native heart without angina pectoris 10/09/2014  . Atrial fibrillation ([place]) 03/08/2014  . Benign prostatic hypertrophy with incomplete bladder emptying 04/20/2013    Current Outpatient Medications on File Prior to Visit  Medication Sig Dispense Refill  . apixaban (ELIQUIS) 5 MG TABS tablet Take 1 tablet (5 mg total) by mouth 2 (two) times daily. 180 tablet 3  . atorvastatin (LIPITOR) 40 MG tablet Take 40 mg by mouth at bedtime.     . benazepril (LOTENSIN) 40 MG tablet TAKE ONE TABLET BY MOUTH EVERY DAY 90 tablet 2  . carvedilol (COREG) 25 MG tablet Take 1 tablet (25 mg total) by mouth 2 (two) times daily with a meal. 180 tablet 1  . ferrous sulfate 325 (65 FE) MG tablet Take 325 mg by mouth daily.    . hydrochlorothiazide (HYDRODIURIL) 12.5 MG tablet Take 1 tablet (12.5 mg  total) by mouth daily. 90 tablet 1  . nabumetone (RELAFEN) 500 MG tablet Take 500 mg by mouth as needed.    . nitroGLYCERIN (NITROSTAT) 0.4 MG SL tablet Place 0.4 mg under the tongue every 5 (five) minutes x 3 doses as needed for chest pain.  (Patient not taking: Reported on 02/23/2020)    . Omega-3 Fatty Acids (OMEGA 3 PO) Take 520 mg by mouth daily.    Marland Kitchen omeprazole (PRILOSEC) 20 MG capsule Take 20 mg by mouth daily.     . potassium chloride (KLOR-CON) 10 MEQ tablet TAKE 1 TABLET BY MOUTH EVERY DAY 90 tablet 3  . Probiotic Product (PROBIOTIC DAILY PO) Take 1 tablet by mouth daily.    . timolol (TIMOPTIC) 0.5 % ophthalmic solution Place 1 drop into both eyes 2 (two) times daily.     Marland Kitchen triamcinolone cream (KENALOG) 0.1 % Apply 1 application topically daily as needed (skin irritation).      No current facility-administered medications on file prior to visit.    Allergies  Allergen Reactions  . Hydrocodone Other (See Comments)    insomnia "awake for 48 hours"    Objective: Physical Exam  General: Well developed, nourished, no acute distress, awake, alert and oriented x 3  Vascular: Dorsalis pedis artery 1/4 bilateral, Posterior tibial artery 0/4 bilateral due to trace edema at ankles bilateral, skin  temperature warm to warm proximal to distal bilateral lower extremities, mild varicosities, scant pedal hair present bilateral.  Neurological: Gross sensation present via light touch bilateral.   Dermatological: Skin is warm, dry, and supple bilateral, Nails 1-10 are tender, long, thick, and discolored with mild subungal debris, previous ingrown procedures nail sites well-healed, no open lesions present bilateral, no callus/corns/hyperkeratotic tissue present bilateral. No signs of infection bilateral.  Musculoskeletal: Pes planus deformities noted bilateral. Muscular strength within normal limits without painon range of motion. No pain with calf compression bilateral.  Assessment and Plan:   Problem List Items Addressed This Visit   None   Visit Diagnoses    Pain due to onychomycosis of toenails of both feet    -  Primary   Diabetes mellitus without complication (HCC)       PVD (peripheral vascular disease) ([place])         -Examined patient.  -Re-Discussed treatment options for painful mycotic nails. -Mechanically debrided and reduced mycotic nails with sterile nail nipper and dremel nail file without incident -Patient to see Liliane Channel today for orthotic assessment -Continue with good supportive shoes daily for foot type -Patient to return as needed nail care in 6 months or sooner if symptoms worsen.  Landis Martins, DPM

## 2020-04-02 DIAGNOSIS — I482 Chronic atrial fibrillation, unspecified: Secondary | ICD-10-CM | POA: Diagnosis not present

## 2020-04-02 DIAGNOSIS — E782 Mixed hyperlipidemia: Secondary | ICD-10-CM | POA: Diagnosis not present

## 2020-04-02 DIAGNOSIS — I2510 Atherosclerotic heart disease of native coronary artery without angina pectoris: Secondary | ICD-10-CM | POA: Diagnosis not present

## 2020-04-02 DIAGNOSIS — R7303 Prediabetes: Secondary | ICD-10-CM | POA: Diagnosis not present

## 2020-04-04 ENCOUNTER — Ambulatory Visit: Payer: Medicare Other | Admitting: Cardiology

## 2020-04-04 DIAGNOSIS — H353121 Nonexudative age-related macular degeneration, left eye, early dry stage: Secondary | ICD-10-CM | POA: Diagnosis not present

## 2020-04-04 DIAGNOSIS — Z9842 Cataract extraction status, left eye: Secondary | ICD-10-CM | POA: Diagnosis not present

## 2020-04-04 DIAGNOSIS — H26491 Other secondary cataract, right eye: Secondary | ICD-10-CM | POA: Diagnosis not present

## 2020-04-04 DIAGNOSIS — Z7984 Long term (current) use of oral hypoglycemic drugs: Secondary | ICD-10-CM | POA: Diagnosis not present

## 2020-04-04 DIAGNOSIS — Z961 Presence of intraocular lens: Secondary | ICD-10-CM | POA: Diagnosis not present

## 2020-04-04 DIAGNOSIS — H47233 Glaucomatous optic atrophy, bilateral: Secondary | ICD-10-CM | POA: Diagnosis not present

## 2020-04-04 DIAGNOSIS — E119 Type 2 diabetes mellitus without complications: Secondary | ICD-10-CM | POA: Diagnosis not present

## 2020-04-04 DIAGNOSIS — H5211 Myopia, right eye: Secondary | ICD-10-CM | POA: Diagnosis not present

## 2020-04-04 DIAGNOSIS — H401132 Primary open-angle glaucoma, bilateral, moderate stage: Secondary | ICD-10-CM | POA: Diagnosis not present

## 2020-04-21 DIAGNOSIS — G4733 Obstructive sleep apnea (adult) (pediatric): Secondary | ICD-10-CM | POA: Diagnosis not present

## 2020-04-25 DIAGNOSIS — I4891 Unspecified atrial fibrillation: Secondary | ICD-10-CM | POA: Diagnosis not present

## 2020-04-25 DIAGNOSIS — Z7901 Long term (current) use of anticoagulants: Secondary | ICD-10-CM | POA: Diagnosis not present

## 2020-04-25 DIAGNOSIS — G4733 Obstructive sleep apnea (adult) (pediatric): Secondary | ICD-10-CM | POA: Diagnosis not present

## 2020-04-25 DIAGNOSIS — D6869 Other thrombophilia: Secondary | ICD-10-CM | POA: Diagnosis not present

## 2020-04-25 DIAGNOSIS — H547 Unspecified visual loss: Secondary | ICD-10-CM | POA: Diagnosis not present

## 2020-04-25 DIAGNOSIS — H409 Unspecified glaucoma: Secondary | ICD-10-CM | POA: Diagnosis not present

## 2020-04-25 DIAGNOSIS — E785 Hyperlipidemia, unspecified: Secondary | ICD-10-CM | POA: Diagnosis not present

## 2020-04-25 DIAGNOSIS — K219 Gastro-esophageal reflux disease without esophagitis: Secondary | ICD-10-CM | POA: Diagnosis not present

## 2020-04-25 DIAGNOSIS — I10 Essential (primary) hypertension: Secondary | ICD-10-CM | POA: Diagnosis not present

## 2020-05-01 DIAGNOSIS — G4733 Obstructive sleep apnea (adult) (pediatric): Secondary | ICD-10-CM | POA: Diagnosis not present

## 2020-05-03 DIAGNOSIS — I10 Essential (primary) hypertension: Secondary | ICD-10-CM | POA: Diagnosis not present

## 2020-05-03 DIAGNOSIS — I4891 Unspecified atrial fibrillation: Secondary | ICD-10-CM | POA: Diagnosis not present

## 2020-05-03 DIAGNOSIS — I2510 Atherosclerotic heart disease of native coronary artery without angina pectoris: Secondary | ICD-10-CM | POA: Diagnosis not present

## 2020-05-08 DIAGNOSIS — E782 Mixed hyperlipidemia: Secondary | ICD-10-CM | POA: Diagnosis not present

## 2020-05-08 DIAGNOSIS — R7303 Prediabetes: Secondary | ICD-10-CM | POA: Diagnosis not present

## 2020-05-15 DIAGNOSIS — R7303 Prediabetes: Secondary | ICD-10-CM | POA: Diagnosis not present

## 2020-05-15 DIAGNOSIS — I10 Essential (primary) hypertension: Secondary | ICD-10-CM | POA: Diagnosis not present

## 2020-05-15 DIAGNOSIS — E782 Mixed hyperlipidemia: Secondary | ICD-10-CM | POA: Diagnosis not present

## 2020-05-15 DIAGNOSIS — I2510 Atherosclerotic heart disease of native coronary artery without angina pectoris: Secondary | ICD-10-CM | POA: Diagnosis not present

## 2020-05-22 ENCOUNTER — Other Ambulatory Visit: Payer: Self-pay | Admitting: Cardiology

## 2020-05-22 ENCOUNTER — Telehealth: Payer: Self-pay | Admitting: Cardiology

## 2020-05-22 DIAGNOSIS — G4733 Obstructive sleep apnea (adult) (pediatric): Secondary | ICD-10-CM | POA: Diagnosis not present

## 2020-05-22 MED ORDER — CARVEDILOL 25 MG PO TABS: 25.0000 mg | ORAL_TABLET | Freq: Two times a day (BID) | ORAL | 1 refills | Status: DC

## 2020-05-22 NOTE — Telephone Encounter (Signed)
Refill sent to pharmacy.   

## 2020-05-22 NOTE — Telephone Encounter (Signed)
*  STAT* If patient is at the pharmacy, call can be transferred to refill team.   1. Which medications need to be refilled? (please list name of each medication and dose if known) carvedilol (COREG) 25 MG tablet  2. Which pharmacy/location (including street and city if local pharmacy) is medication to be sent to? [place], [place]  3. Do they need a 30 day or 90 day supply? [place]

## 2020-05-24 ENCOUNTER — Telehealth: Payer: Self-pay | Admitting: Cardiology

## 2020-05-24 DIAGNOSIS — I5033 Acute on chronic diastolic (congestive) heart failure: Secondary | ICD-10-CM | POA: Diagnosis not present

## 2020-05-24 DIAGNOSIS — Z6828 Body mass index (BMI) 28.0-28.9, adult: Secondary | ICD-10-CM | POA: Diagnosis not present

## 2020-05-24 DIAGNOSIS — R635 Abnormal weight gain: Secondary | ICD-10-CM | POA: Diagnosis not present

## 2020-05-24 NOTE — Telephone Encounter (Signed)
Returned a call to patient. Informed him that I will pull a download and speak with Dr Radford Pax. Dr Claiborne Billings is on Vacation. Once I hear back from Dr Radford Pax I will call him back with her decision.  It may be the beginning of the week. I am not sure if she is available today. Patient voiced understanding and agrees with the plan. Note staff message sent to Dr Radford Pax.

## 2020-05-24 NOTE — Telephone Encounter (Signed)
Patient would like for our office to contact [facility] and get his recent downloads and have Dr. Claiborne Billings review the results. Please give him a call back. Thank you!

## 2020-05-24 NOTE — Telephone Encounter (Signed)
Received a message back from Dr Radford Pax to change CPAP pressure from 14-16 CmH2O to 5-20 CmH2O. Follow up with Dr Claiborne Billings. Patient has been scheduled to see Dr Claiborne Billings on 06/03/20. He also mentioned that his "doctor" has given him 50 mg of trazodone.

## 2020-05-27 DIAGNOSIS — I5033 Acute on chronic diastolic (congestive) heart failure: Secondary | ICD-10-CM | POA: Diagnosis not present

## 2020-05-27 DIAGNOSIS — G4733 Obstructive sleep apnea (adult) (pediatric): Secondary | ICD-10-CM | POA: Diagnosis not present

## 2020-05-27 DIAGNOSIS — Z6827 Body mass index (BMI) 27.0-27.9, adult: Secondary | ICD-10-CM | POA: Diagnosis not present

## 2020-05-31 DIAGNOSIS — G4733 Obstructive sleep apnea (adult) (pediatric): Secondary | ICD-10-CM | POA: Diagnosis not present

## 2020-06-02 DIAGNOSIS — I10 Essential (primary) hypertension: Secondary | ICD-10-CM | POA: Diagnosis not present

## 2020-06-02 DIAGNOSIS — I4891 Unspecified atrial fibrillation: Secondary | ICD-10-CM | POA: Diagnosis not present

## 2020-06-02 DIAGNOSIS — I2510 Atherosclerotic heart disease of native coronary artery without angina pectoris: Secondary | ICD-10-CM | POA: Diagnosis not present

## 2020-06-03 ENCOUNTER — Other Ambulatory Visit: Payer: Self-pay

## 2020-06-03 ENCOUNTER — Encounter: Payer: Self-pay | Admitting: Cardiovascular Disease

## 2020-06-03 ENCOUNTER — Ambulatory Visit (INDEPENDENT_AMBULATORY_CARE_PROVIDER_SITE_OTHER): Payer: Medicare PPO | Admitting: Cardiovascular Disease

## 2020-06-03 DIAGNOSIS — I2510 Atherosclerotic heart disease of native coronary artery without angina pectoris: Secondary | ICD-10-CM

## 2020-06-03 DIAGNOSIS — Z7901 Long term (current) use of anticoagulants: Secondary | ICD-10-CM

## 2020-06-03 DIAGNOSIS — G4733 Obstructive sleep apnea (adult) (pediatric): Secondary | ICD-10-CM

## 2020-06-03 DIAGNOSIS — R600 Localized edema: Secondary | ICD-10-CM

## 2020-06-03 DIAGNOSIS — I10 Essential (primary) hypertension: Secondary | ICD-10-CM

## 2020-06-03 DIAGNOSIS — Z9989 Dependence on other enabling machines and devices: Secondary | ICD-10-CM | POA: Diagnosis not present

## 2020-06-03 DIAGNOSIS — I4821 Permanent atrial fibrillation: Secondary | ICD-10-CM

## 2020-06-03 NOTE — Patient Instructions (Addendum)
Medication Instructions:   changes - may take an extra HCTZ 12.5 mg for swelling if needed . Discuss with Dr Agustin Cree if extra pills are needed  *If you need a refill on your cardiac medications before your next appointment, please call your pharmacy*   Lab Work:  Not needed   Testing/Procedures:  Not needed  Follow-Up: At [facility], you and your health needs are our priority.  As part of our continuing mission to provide you with exceptional heart care, we have created designated Provider Care Teams.  These Care Teams include your primary Cardiologist (physician) and Advanced Practice Providers (APPs -  Physician Assistants and Nurse Practitioners) who all work together to provide you with the care you need, when you need it.     Your next appointment:   12 month(s)  The format for your next appointment:   In Person  Provider:   Shelva Majestic, MD   Other Instructions  pressure changes 14-20  Auto  were done to your C-PAP

## 2020-06-03 NOTE — Progress Notes (Signed)
Cardiology Office Note    Date:  06/04/2020   ID:  Spencer Cochran, DOB 1945-03-19, MRN [id number]  PCP:  Maryella Shivers, MD  Cardiologist:  Shelva Majestic, MD (sleep); Dr. Agustin Cree  Sleep clinic evaluation  History of Present Illness:  Spencer Cochran is a 75 y.o. male who is followed by Dr. Agustin Cree.  He has a history of permanent atrial fibrillation, CAD, hyperlipidemia, as well as sleep apnea.  I saw him for initial sleep evaluation in May 2021.  He presents for a follow-up sleep evaluation following obtaining a new CPAP machine in December 2021.  Spencer Cochran is retired from [employer] and had an initial sleep study in 2002 done in [employer].  He apparently has been on CPAP therapy since that time.  He currently is on his second CPAP machine and apparently has a ResMed CPAP unit with [employer] as his DME.  He has a history of atrial fibrillation as well as obesity and apparently lost over 60 pounds since September 2019.  He also has a history of choledocholithiasis and is status post ERCP with biliary sphincterotomy with stone extraction in March 2021.  I saw him for initial evaluation with me on Jun 21, 2019.  I was able to contact [facility] and to obtain a download from May 04, 2019 through June 02, 2019.  He has a ResMed air sense 10 AutoSet unit with a minimum pressure of 12 and maximum of 14.  Compliance was excellent at 100% with usage greater than 4 hours at 100%, averaging 7 hours and 53 minutes.  AHI was increased at 8.7 and he did have moderate leak with 95th percentile leak from his mask at 57.8 L/min.  I did not have the specifics of his initial sleep study and will try to obtain this.  He did have a history of snoring as well as fatigability.  During that evaluation, recommended adjusting his CPAP parameters to a minimum pressure of 12 with maximum to 18 and changed his ramp start from 4 to 6 cm and change his EPR to 3.  His blood pressure was stable on carvedilol 25 mg twice a  day, furosemide 40 mg daily, and amlodipine/benazepril 5/40 mg daily.  He continues to be on Eliquis for anticoagulation and was on atorvastatin for hyperlipidemia.  He had a COVID 19 infection in February/March 2021 and at that time had resolution of symptoms.  Since I last saw Mr. Spencer Cochran, apparently he was contacted by [employer] to get a new machine.  There was plans for him to get the new ResMed air sense 11 auto unit but his machine was delivered in December 2021 and this was his similar Res Med AirSense 10 CPAP unit.  The AitSense11 did not come out until February 2022.  His set up date was January 22, 2020.  A download was obtained from the off day.  Currently, there was some recent adjustment not done by me to his settings.  1 download his minimum pressure was 5 to a maximum of 20 and on his most recent download apparently somewhat has changed his settings to a minimum pressure of 14 and a maximum pressure of 16.  His most recent download from March 23 through May 23, 2020 confirms 100% use.  With his minimal pressure range of 14 to 16 cm, AHI was elevated at 7.7.  He does not have significant leak but has had an occasional leak in his mask on some occasions.  He believes he is  sleeping well.  He is unaware of breakthrough snoring.  He has noted some mild leg swelling and believes he has not been as aggressive in sodium restriction.  Since September 2019 he admits to a 60 pound weight loss and has been exercising.  He presents for evaluation.    Past Medical History:  Diagnosis Date  . Arthritis knees and ankle  . Asymptomatic gallstones   . Atrial fibrillation ([place]) 03/08/2014  . Benign prostatic hypertrophy with incomplete bladder emptying 04/20/2013  . Biliary obstruction   . Cancer ([place])    skin   . Cataract immature BILATERAL EYES  . Choledocholithiasis   . Coronary artery disease CARDIOLOGIST-  DR Agustin Cree  [facility])---  LAST VISIT 04-29-2011   DENIES CARDIAC SYMPTOMS  . Coronary  artery disease involving native coronary artery of native heart without angina pectoris 10/09/2014   Luminal disease but cardiac catheterization from 2014, stress test in the summer of 2018 showed no evidence of ischemia  Formatting of this note might be different from the original. 20% circumflex 20% RCA in July 2014  . COVID-19 virus infection 02/2020  . DDD (degenerative disc disease), lumbar   . Dyslipidemia 10/01/2016  . Dysrhythmia    A Fib   . Frequency of urination   . Gait abnormality 08/03/2018  . GERD (gastroesophageal reflux disease)   . Glaucoma    both eyes  . History of colon polyps   . History of kidney stones   . Hypertension   . 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 Left ventricular diastolic dysfunction PER ECHO 03-31-2011  W/ CHART  . Nocturia   . Obstructive jaundice   . Obstructive sleep apnea 06/05/2019  . OSA on CPAP    wears cpap  . Palpitations 08/10/2019  . Paresthesia 07/18/2018  . Pre-diabetes    last hemaglobin a 1 c was 5.6  . Unsteadiness   . Urethral stricture     Past Surgical History:  Procedure Laterality Date  . APPENDECTOMY  1972  . BALLOON DILATION N/A 04/13/2019   Procedure: BALLOON DILATION;  Surgeon: Jackquline Denmark, MD;  Location: [facility];  Service: Endoscopy;  Laterality: N/A;  . CARDIOVASCULAR STRESS TEST  04-29-2010   DR Halford Chessman   NO EVIDENCE ISCHEMIA/ NORMAL LVSF AND WALL MOTION/ EF 63%  . CERVICAL FUSION  2006   C3 - 6  . COLONOSCOPY  03/23/2012   Small colonic polyps, status post polypectomy. Pancolonic diverticulosis predominantly in the left colon.Small internal and external hemorrhoids  . COLONOSCOPY  2019   Dr Orlena Sheldon. Diverticulosis. Doesn't think he removed polpys   . CYSTO/ URETHRAL DILATION/ TRANSURETHRAL INCISIONOF PROSTATE  01-13-2007  . CYSTOSCOPY  2005  . CYSTOSCOPY W/ RETROGRADES  06/12/2011   Procedure: CYSTOSCOPY WITH RETROGRADE PYELOGRAM;  Surgeon: Ailene Rud, MD;   [facility];  Service: Urology;  Laterality: N/A;  cysto, urethral dilation, right retrograde pyelogram    . CYSTOSCOPY WITH RETROGRADE PYELOGRAM, URETEROSCOPY AND STENT PLACEMENT Left 03/08/2014   Procedure: CYSTOSCOPY WITH LEFT RETROGRADE PYELOGRAM/LEFT  URETEROSCOPY;  Surgeon: Ailene Rud, MD;  Location: [facility];  Service: Urology;  Laterality: Left;  . CYSTOSCOPY WITH URETHRAL DILATATION N/A 04/20/2013   Procedure: CYSTOSCOPY WITH URETHRAL DILATATION;  Surgeon: Ailene Rud, MD;  Location: [facility];  Service: Urology;  Laterality: N/A;  . CYSTOSCOPY/URETEROSCOPY/HOLMIUM LASER/STENT PLACEMENT Left 06/10/2017   Procedure: CYSTOSCOPY/RETROGRADE/URETEROSCOPY/HOLMIUM LASER/STENT PLACEMENT;  Surgeon: Raynelle Bring, MD;  Location: [facility];  Service: Urology;  Laterality: Left;  . CYSTOSCOPY/URETEROSCOPY/HOLMIUM LASER/STENT PLACEMENT Right 03/09/2019   Procedure: CYSTOSCOPY/RETROGRADE/URETEROSCOPY/HOLMIUM LASER/STENT PLACEMENT;  Surgeon: Raynelle Bring, MD;  Location: [facility];  Service: Urology;  Laterality: Right;  ONLY NEEDS 60 MIN  . ENDOSCOPIC RETROGRADE CHOLANGIOPANCREATOGRAPHY (ERCP) WITH PROPOFOL N/A 04/13/2019   Procedure: ENDOSCOPIC RETROGRADE CHOLANGIOPANCREATOGRAPHY (ERCP) WITH PROPOFOL;  Surgeon: Jackquline Denmark, MD;  Location[facility];  Service: Endoscopy;  Laterality: N/A;  . EXTRACORPOREAL SHOCK WAVE LITHOTRIPSY  01-17-2007   LEFT  . HERNIA REPAIR  01/30/2019   [facility] Left inguinal hernia repair  . HOLMIUM LASER APPLICATION Left 05/07/3644   Procedure: LASER OF LEFT RENAL PELVIC STONE;  Surgeon: Ailene Rud, MD;  Location: [facility];  Service: Urology;  Laterality: Left;  . INGUINAL HERNIA REPAIR  2003  . JOINT REPLACEMENT     total knee right 03-01-17  . LEFT ACHILLES TENDON REPAIR  1992  . MASS EXCISION N/A 11/14/2018   Procedure: EXCISIONAL BIOPSY OF GLANS PENIS;  Surgeon: Raynelle Bring, MD;  Location: [facility];   Service: Urology;  Laterality: N/A;  ONLY NEEDS 60 MIN  . NASAL SINUS SURGERY  2012  . PENILE SURGERY  OF MEATUS  1955  . REMOVAL OF STONES  04/13/2019   Procedure: REMOVAL OF STONES;  Surgeon: Jackquline Denmark, MD;  Location: [facility];  Service: Endoscopy;;  . [name]  04/13/2019   Procedure: [name];  Surgeon: Jackquline Denmark, MD;  Location: [facility];  Service: Endoscopy;;  . TRANSTHORACIC ECHOCARDIOGRAM  03-31-2011   NORMAL LVEF  59%/ TRIVIAL MR/ DIASTOLIC DYSFUNCTION/ MODERATE LVH  . TRANSURETHRAL RESECTION OF PROSTATE N/A 04/20/2013   Procedure: TRANSURETHRAL RESECTION OF THE PROSTATE WITH GYRUS INSTRUMENTS;  Surgeon: Ailene Rud, MD;  Location: [facility];  Service: Urology;  Laterality: N/A;    Current Medications: Outpatient Medications Prior to Visit  Medication Sig Dispense Refill  . apixaban (ELIQUIS) 5 MG TABS tablet Take 1 tablet (5 mg total) by mouth 2 (two) times daily. 180 tablet 3  . atorvastatin (LIPITOR) 40 MG tablet Take 20 mg by mouth at bedtime. Taking 20 MG Daily    . benazepril (LOTENSIN) 40 MG tablet TAKE ONE TABLET BY MOUTH EVERY DAY (Patient taking differently: Take 20 mg by mouth daily. Taking 20 MG Daily) 90 tablet 2  . carvedilol (COREG) 25 MG tablet Take 1 tablet (25 mg total) by mouth 2 (two) times daily. 180 tablet 1  . ferrous sulfate 325 (65 FE) MG tablet Take 325 mg by mouth daily.    . nabumetone (RELAFEN) 500 MG tablet Take 500 mg by mouth as needed.    . nitroGLYCERIN (NITROSTAT) 0.4 MG SL tablet Place 0.4 mg under the tongue every 5 (five) minutes x 3 doses as needed for chest pain.    . Omega-3 Fatty Acids (OMEGA 3 PO) Take 520 mg by mouth daily.    Marland Kitchen omeprazole (PRILOSEC) 20 MG capsule Take 20 mg by mouth daily.     . potassium chloride (KLOR-CON) 10 MEQ tablet TAKE 1 TABLET BY MOUTH EVERY DAY 90 tablet 3  . Probiotic Product (PROBIOTIC DAILY PO) Take 1 tablet by mouth daily.    . timolol (TIMOPTIC) 0.5 % ophthalmic solution  Place 1 drop into both eyes 2 (two) times daily.     Marland Kitchen triamcinolone cream (KENALOG) 0.1 % Apply 1 application topically daily as needed (skin irritation).     . hydrochlorothiazide (HYDRODIURIL) 12.5 MG tablet Take 1 tablet (12.5 mg total)  by mouth daily. 90 tablet 1   No facility-administered medications prior to visit.     Allergies:   Hydrocodone   Social History   Socioeconomic History  . Marital status: Married    Spouse name: Not on file  . Number of children: 2  . Years of education: some college  . Highest education level: Not on file  Occupational History  . Occupation: retired Social research officer, government  Tobacco Use  . Smoking status: Never Smoker  . Smokeless tobacco: Never Used  Vaping Use  . Vaping Use: Never used  Substance and Sexual Activity  . Alcohol use: No  . Drug use: No  . Sexual activity: Yes  Other Topics Concern  . Not on file  Social History Narrative   Lives at home with his wife.   Right-handed.   Caffeine use:  32-40 ounces caffeine per day.   Social Determinants of Health   Financial Resource Strain: Not on file  Food Insecurity: Not on file  Transportation Needs: Not on file  Physical Activity: Not on file  Stress: Not on file  Social Connections: Not on file    Social history is notable he is married for 42 years he has 2 children.  He is retired and previously worked as a Designer, industrial/product.  Family History:  The patient's family history includes Breast cancer in his mother; Cirrhosis in his father; Colon cancer in his maternal aunt.  His father died at age 46 with liver cancer and CHF.  Mother died at 71 with heart failure.  ROS General: Negative; No fevers, chills, or night sweats;  HEENT: Negative; No changes in vision or hearing, sinus congestion, difficulty swallowing Pulmonary: Negative; No cough, wheezing, shortness of breath, hemoptysis Cardiovascular: Negative; No chest pain, presyncope, syncope, palpitations GI: For gallbladder  disease. GU: Negative; No dysuria, hematuria, or difficulty voiding Musculoskeletal: Negative; no myalgias, joint pain, or weakness Hematologic/Oncology: Negative; no easy bruising, bleeding Endocrine: Negative; no heat/cold intolerance; no diabetes Neuro: Negative; no changes in balance, headaches Skin: Negative; No rashes or skin lesions Psychiatric: Negative; No behavioral problems, depression Sleep: Obstructive sleep apnea since 2002 CPAP.  No snoring, daytime sleepiness, hypersomnolence, bruxism, restless legs, hypnogognic hallucinations, no cataplexy Other comprehensive 14 point system review is negative.   PHYSICAL EXAM:   VS:  BP 120/80   Pulse 78   Ht 5' 9"  (1.753 m)   Wt 183 lb (83 kg)   BMI 27.02 kg/m     Repeat blood pressure by me was 122/78  Wt Readings from Last 3 Encounters:  06/03/20 183 lb (83 kg)  02/23/20 182 lb (82.6 kg)  02/07/20 182 lb (82.6 kg)    General: Alert, oriented, no distress.  Skin: normal turgor, no rashes, warm and dry HEENT: Normocephalic, atraumatic. Pupils equal round and reactive to light; sclera anicteric; extraocular muscles intact;  Nose without nasal septal hypertrophy Mouth/Parynx benign; Mallinpatti scale 3 Neck: No JVD, no carotid bruits; normal carotid upstroke Lungs: clear to ausculatation and percussion; no wheezing or rales Chest wall: without tenderness to palpitation Heart: PMI not displaced, RRR, s1 s2 normal, 1/6 systolic murmur, no diastolic murmur, no rubs, gallops, thrills, or heaves Abdomen: soft, nontender; no hepatosplenomehaly, BS+; abdominal aorta nontender and not dilated by palpation. Back: no CVA tenderness Pulses 2+ Musculoskeletal: full range of motion, normal strength, no joint deformities Extremities: no clubbing cyanosis or edema, Homan's sign negative  Neurologic: grossly nonfocal; Cranial nerves grossly wnl Psychologic: Normal mood and affect   Studies/Labs  Reviewed:   EKG:  EKG is  ordered today.   ECG (independently read by me): Atrial fibrillation at 78, LAHB, QTc 446 msec  I personally reviewed the ECG from November 07, 2018 which shows his permanent atrial fibrillation with PVC.  There is left anterior hemiblock.  Recent Labs: BMP Latest Ref Rng & Units 02/23/2020 12/18/2019 08/10/2019  Glucose 70 - 99 mg/dL 96 - 86  BUN 6 - 23 mg/dL 23 28(A) 15  Creatinine 0.40 - 1.50 mg/dL 0.84 0.8 0.80  BUN/Creat Ratio 10 - 24 - - 19  Sodium 135 - 145 mEq/L 141 143 143  Potassium 3.5 - 5.1 mEq/L 4.1 3.6 4.8  Chloride 96 - 112 mEq/L 103 106 104  CO2 19 - 32 mEq/L 34(H) 32(A) 29  Calcium 8.4 - 10.5 mg/dL 9.3 9.4 9.2     Hepatic Function Latest Ref Rng & Units 02/23/2020 12/18/2019 06/05/2019  Total Protein 6.0 - 8.3 g/dL 6.3 - 6.5  Albumin 3.5 - 5.2 g/dL 3.8 3.7 3.7  AST 0 - 37 U/L 23 39 37  ALT 0 - 53 U/L 16 19 19   Alk Phosphatase 39 - 117 U/L 64 58 109  Total Bilirubin 0.2 - 1.2 mg/dL 0.4 - 0.7    CBC Latest Ref Rng & Units 02/23/2020 12/18/2019 04/11/2019  WBC 4.0 - 10.5 K/uL 7.8 5.7 5.5  Hemoglobin 13.0 - 17.0 g/dL 12.9(L) 13.5 12.9(L)  Hematocrit 39.0 - 52.0 % 38.4(L) 40(A) 37.9(L)  Platelets 150.0 - 400.0 K/uL 301.0 226 211.0   Lab Results  Component Value Date   MCV 98.8 02/23/2020   MCV 96.1 04/11/2019   MCV 98 (H) 03/16/2019   Lab Results  Component Value Date   TSH 0.643 03/16/2019   Lab Results  Component Value Date   HGBA1C 5.5 11/07/2018     BNP No results found for: BNP  ProBNP    Component Value Date/Time   PROBNP 1,606 (H) 08/10/2019 1646     Lipid Panel  No results found for: CHOL, TRIG, HDL, CHOLHDL, VLDL, LDLCALC, LDLDIRECT, LABVLDL   RADIOLOGY: No results found.   Additional studies/ records that were reviewed today include:  I reviewed the records of Dr. Agustin Cree.  I have personally obtained a download of his machine from November 09, 2018 through December 08, 2018; April 1 through June 02, 2019 and from Jun 09, 2019 through Jun 16, 2019.   ASSESSMENT:    1. OSA on CPAP   2. Coronary artery disease involving native coronary artery of native heart without angina pectoris   3. Permanent atrial fibrillation ([place])   4. Bilateral lower extremity edema   5. Primary hypertension   6. Anticoagulated     PLAN:  Spencer Cochran a 75 year old gentleman who has a history of permanent atrial fibrillation, prior obesity, who has normal LV function.  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  He has been on CPAP therapy for 20 years commencing in 2002 with an apparent initial sleep study performed in [place].  He currently has a more recent ResMed air sense 10 CPAP auto machine and his DME company is [employer].  In September 2019 he has been successful with a 60 pound weight loss and this has significantly improved his sleep apnea.  Since I last saw him, he apparently qualified for a new machine and this was setup  by [employer] on January 22, 2020.  Unfortunately, he did not get the new ResMed AirSense 11 which was not available until February 2022.  I reviewed his most recent download.  Apparently either he or [employer] had made recent adjustments such that his minimum pressure was set at 14 with maximum pressure at 16.  His AHI is elevated at 7.7.  I am changing his pressure to a minimum of 14 and maximum of 20 cm.  On his most recent download his 95th percentile pressure was 15.6 with a maximum average pressure at 15.8.  He is compliant with usage at 100% and average use at 7 hours and 24 minutes.  An Epworth sleepiness scale was calculated in the office today and this endorsed at 9 arguing against residual daytime sleepiness.  He is unaware of breakthrough snoring.  He has had some issues with leg swelling particularly pretibially above his sock line.  He has been on a medical regimen for blood pressure with benazepril 40 mg daily, HCTZ 12.5 mg daily, carvedilol 25 mg twice a day.   Have suggested that he can take an extra 12.5 mg of HCTZ on a as needed basis depending upon his leg edema.  He continues to be in atrial fibrillation with a controlled ventricular rate and is on Eliquis 5 mg twice a day without bleeding issues.  He continues to be on atorvastatin 20 mg daily for hyperlipidemia.  He will be having a follow-up visit with Dr. Agustin Cree in several weeks.  I will see him in 1 year for follow-up evaluation   Medication Adjustments/Labs and Tests Ordered: Current medicines are reviewed at length with the patient today.  Concerns regarding medicines are outlined above.  Medication changes, Labs and Tests ordered today are listed in the Patient Instructions below. Patient Instructions  Medication Instructions:   changes - may take an extra HCTZ 12.5 mg for swelling if needed . Discuss with Dr Agustin Cree if extra pills are needed  *If you need a refill on your cardiac medications before your next appointment, please call your pharmacy*   Lab Work:  Not needed   Testing/Procedures:  Not needed  Follow-Up: At [facility], you and your health needs are our priority.  As part of our continuing mission to provide you with exceptional heart care, we have created designated Provider Care Teams.  These Care Teams include your primary Cardiologist (physician) and Advanced Practice Providers (APPs -  Physician Assistants and Nurse Practitioners) who all work together to provide you with the care you need, when you need it.     Your next appointment:   12 month(s)  The format for your next appointment:   In Person  Provider:   Shelva Majestic, MD   Other Instructions  pressure changes 14-20  Auto  were done to your C-PAP    Signed, Shelva Majestic, MD  06/04/2020 11:24 AM    [facility] [address] [place], [place], [place]  [address] Phone: [phone]

## 2020-06-04 ENCOUNTER — Telehealth: Payer: Self-pay | Admitting: Cardiovascular Disease

## 2020-06-04 ENCOUNTER — Encounter: Payer: Self-pay | Admitting: Cardiovascular Disease

## 2020-06-04 NOTE — Telephone Encounter (Signed)
1) What problem are you experiencing? Numbers were supposed to be adjusted by Dr. Claiborne Billings, but they are the same.   2) Who is your medical equipment company? [employer]   States Claiborne Billings advised he was switching it from 5 to 20 to 14 to 20 at appt, but when he used it last night he saw it was still 5 to 20. States he can readjust it himself if need be. Please advise.   Please route to the sleep study assistant.

## 2020-06-05 NOTE — Telephone Encounter (Signed)
Message routed to [facility]

## 2020-06-05 NOTE — Telephone Encounter (Signed)
Follow Up   : Pt is calling back, said he is waiting to find out what to do about his C Pap machine. If he does not answer, please leave a message. He can do the adjustment if he is told what to adjust it to.

## 2020-06-07 ENCOUNTER — Encounter: Payer: Self-pay | Admitting: Cardiovascular Disease

## 2020-06-10 ENCOUNTER — Ambulatory Visit: Payer: Medicare PPO | Admitting: Cardiology

## 2020-06-13 DIAGNOSIS — I38 Endocarditis, valve unspecified: Secondary | ICD-10-CM

## 2020-06-13 DIAGNOSIS — Z461 Encounter for fitting and adjustment of hearing aid: Secondary | ICD-10-CM | POA: Insufficient documentation

## 2020-06-13 DIAGNOSIS — D126 Benign neoplasm of colon, unspecified: Secondary | ICD-10-CM

## 2020-06-13 DIAGNOSIS — R319 Hematuria, unspecified: Secondary | ICD-10-CM

## 2020-06-13 DIAGNOSIS — H903 Sensorineural hearing loss, bilateral: Secondary | ICD-10-CM | POA: Insufficient documentation

## 2020-06-13 DIAGNOSIS — E669 Obesity, unspecified: Secondary | ICD-10-CM

## 2020-06-13 DIAGNOSIS — N200 Calculus of kidney: Secondary | ICD-10-CM

## 2020-06-13 HISTORY — DX: Endocarditis, valve unspecified: I38

## 2020-06-13 HISTORY — DX: Benign neoplasm of colon, unspecified: D12.6

## 2020-06-13 HISTORY — DX: Calculus of kidney: N20.0

## 2020-06-13 HISTORY — DX: Obesity, unspecified: E66.9

## 2020-06-13 HISTORY — DX: Sensorineural hearing loss, bilateral: H90.3

## 2020-06-13 HISTORY — DX: Hematuria, unspecified: R31.9

## 2020-06-13 HISTORY — DX: Encounter for fitting and adjustment of hearing aid: Z46.1

## 2020-06-18 DIAGNOSIS — E860 Dehydration: Secondary | ICD-10-CM | POA: Diagnosis not present

## 2020-06-18 DIAGNOSIS — I4891 Unspecified atrial fibrillation: Secondary | ICD-10-CM | POA: Diagnosis not present

## 2020-06-18 DIAGNOSIS — I517 Cardiomegaly: Secondary | ICD-10-CM | POA: Diagnosis not present

## 2020-06-18 DIAGNOSIS — R4020 Unspecified coma: Secondary | ICD-10-CM | POA: Diagnosis not present

## 2020-06-18 DIAGNOSIS — R42 Dizziness and giddiness: Secondary | ICD-10-CM | POA: Diagnosis not present

## 2020-06-18 DIAGNOSIS — K4090 Unilateral inguinal hernia, without obstruction or gangrene, not specified as recurrent: Secondary | ICD-10-CM | POA: Diagnosis not present

## 2020-06-18 DIAGNOSIS — R55 Syncope and collapse: Secondary | ICD-10-CM | POA: Diagnosis not present

## 2020-06-18 DIAGNOSIS — N200 Calculus of kidney: Secondary | ICD-10-CM | POA: Diagnosis not present

## 2020-06-18 DIAGNOSIS — I959 Hypotension, unspecified: Secondary | ICD-10-CM | POA: Diagnosis not present

## 2020-06-18 DIAGNOSIS — R197 Diarrhea, unspecified: Secondary | ICD-10-CM | POA: Diagnosis not present

## 2020-06-18 DIAGNOSIS — Z6827 Body mass index (BMI) 27.0-27.9, adult: Secondary | ICD-10-CM | POA: Diagnosis not present

## 2020-06-18 DIAGNOSIS — N390 Urinary tract infection, site not specified: Secondary | ICD-10-CM | POA: Diagnosis not present

## 2020-06-20 ENCOUNTER — Encounter: Payer: Self-pay | Admitting: Cardiology

## 2020-06-20 ENCOUNTER — Ambulatory Visit (INDEPENDENT_AMBULATORY_CARE_PROVIDER_SITE_OTHER): Payer: Medicare PPO | Admitting: Cardiology

## 2020-06-20 ENCOUNTER — Other Ambulatory Visit: Payer: Self-pay

## 2020-06-20 VITALS — BP 100/70 | HR 80 | Ht 69.0 in | Wt 184.0 lb

## 2020-06-20 DIAGNOSIS — I519 Heart disease, unspecified: Secondary | ICD-10-CM | POA: Diagnosis not present

## 2020-06-20 DIAGNOSIS — I10 Essential (primary) hypertension: Secondary | ICD-10-CM

## 2020-06-20 DIAGNOSIS — I2510 Atherosclerotic heart disease of native coronary artery without angina pectoris: Secondary | ICD-10-CM

## 2020-06-20 DIAGNOSIS — R002 Palpitations: Secondary | ICD-10-CM

## 2020-06-20 NOTE — Patient Instructions (Signed)
Medication Instructions:  Your physician recommends that you continue on your current medications as directed. Please refer to the Current Medication list given to you today.  *If you need a refill on your cardiac medications before your next appointment, please call your pharmacy*   Lab Work: NONE If you have labs (blood work) drawn today and your tests are completely normal, you will receive your results only by: MyChart Message (if you have MyChart) OR A paper copy in the mail If you have any lab test that is abnormal or we need to change your treatment, we will call you to review the results.   Testing/Procedures: NONE   Follow-Up: At [facility], you and your health needs are our priority.  As part of our continuing mission to provide you with exceptional heart care, we have created designated Provider Care Teams.  These Care Teams include your primary Cardiologist (physician) and Advanced Practice Providers (APPs -  Physician Assistants and Nurse Practitioners) who all work together to provide you with the care you need, when you need it.  We recommend signing up for the patient portal called "MyChart".  Sign up information is provided on this After Visit Summary.  MyChart is used to connect with patients for Virtual Visits (Telemedicine).  Patients are able to view lab/test results, encounter notes, upcoming appointments, etc.  Non-urgent messages can be sent to your provider as well.   To learn more about what you can do with MyChart, go to [email or web address].    Your next appointment:   3 month(s)  The format for your next appointment:   In Person  Provider:   Robert Krasowski, MD    Other Instructions   

## 2020-06-20 NOTE — Progress Notes (Signed)
Cardiology Office Note:    Date:  06/20/2020   ID:  Spencer Cochran, DOB August 27, 1945, MRN [id number]  PCP:  Maryella Shivers, MD  Cardiologist:  Jenne Campus, MD    Referring MD: Maryella Shivers, MD   Chief Complaint  Patient presents with  . Follow-up  Diarrhea on Saturday  History of Present Illness:    Spencer Cochran is a 75 y.o. male with past medical history significant for remote coronary artery disease, permanent atrial fibrillation, there was some suspicion for amyloidosis however work-up has been negative.  Essential hypertension, dyslipidemia. He comes today to my office for follow-up.  On Saturday he end up having diarrhea he went to his primary care physician he was hypotensive was transported via ambulance to the hospital in the hospital he was rehydrated was also given Cipro and now couple days later he is back to himself.  He denies having any chest pain, tightness, pressure, burning in the chest.  He goes to the gym on the regular basis 3 times a week and does exercises that he enjoys and have no difficulty doing it.  Past Medical History:  Diagnosis Date  . Arthritis knees and ankle  . Asymptomatic gallstones   . Atrial fibrillation ([place]) 03/08/2014  . Benign neoplasm of colon 06/13/2020  . Benign prostatic hypertrophy with incomplete bladder emptying 04/20/2013  . Biliary obstruction   . Cancer ([place])    skin   . Cataract immature BILATERAL EYES  . Choledocholithiasis   . Coronary artery disease CARDIOLOGIST-  DR Agustin Cree  [facility])---  LAST VISIT 04-29-2011   DENIES CARDIAC SYMPTOMS  . Coronary artery disease involving native coronary artery of native heart without angina pectoris 10/09/2014   Luminal disease but cardiac catheterization from 2014, stress test in the summer of 2018 showed no evidence of ischemia  Formatting of this note might be different from the original. 20% circumflex 20% RCA in July 2014  . COVID-19 virus infection 02/2020  . DDD  (degenerative disc disease), lumbar   . Dyslipidemia 10/01/2016  . Dysrhythmia    A Fib   . Endocarditis 06/13/2020   Apr 25, 2007 Entered By: Mahlon Gammon C Comment: AORTIC STENOSIS, MITRAL AND TRICUSPID REGURGITATION  . Frequency of urination   . Gait abnormality 08/03/2018  . GERD (gastroesophageal reflux disease)   . Glaucoma    both eyes  . History of colon polyps   . History of kidney stones   . Hypertension   . 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 Kidney stone 06/13/2020   Apr 25, 2007 Entered By: Mahlon Gammon C Comment: TREATED WITH LITHOTRIPSY  . Left ventricular diastolic dysfunction PER ECHO 03-31-2011  W/ CHART  . Nocturia   . Obstructive jaundice   . Obstructive sleep apnea 06/05/2019  . OSA on CPAP    wears cpap  . Palpitations 08/10/2019  . Paresthesia 07/18/2018  . Pre-diabetes    last hemaglobin a 1 c was 5.6  . Unsteadiness   . Urethral stricture     Past Surgical History:  Procedure Laterality Date  . APPENDECTOMY  1972  . BALLOON DILATION N/A 04/13/2019   Procedure: BALLOON DILATION;  Surgeon: Jackquline Denmark, MD;  Location: [facility];  Service: Endoscopy;  Laterality: N/A;  . CARDIOVASCULAR STRESS TEST  04-29-2010   DR Halford Chessman   NO EVIDENCE ISCHEMIA/ NORMAL LVSF AND WALL MOTION/ EF 63%  . CERVICAL FUSION  2006   C3 - 6  . COLONOSCOPY  03/23/2012   Small colonic polyps, status post polypectomy. Pancolonic diverticulosis predominantly in the left colon.Small internal and external hemorrhoids  . COLONOSCOPY  2019   Dr Orlena Sheldon. Diverticulosis. Doesn't think he removed polpys   . CYSTO/ URETHRAL DILATION/ TRANSURETHRAL INCISIONOF PROSTATE  01-13-2007  . CYSTOSCOPY  2005  . CYSTOSCOPY W/ RETROGRADES  06/12/2011   Procedure: CYSTOSCOPY WITH RETROGRADE PYELOGRAM;  Surgeon: Ailene Rud, MD;  Location: [facility];  Service: Urology;  Laterality: N/A;  cysto, urethral dilation, right retrograde pyelogram     . CYSTOSCOPY WITH RETROGRADE PYELOGRAM, URETEROSCOPY AND STENT PLACEMENT Left 03/08/2014   Procedure: CYSTOSCOPY WITH LEFT RETROGRADE PYELOGRAM/LEFT  URETEROSCOPY;  Surgeon: Ailene Rud, MD;  Location: [facility];  Service: Urology;  Laterality: Left;  . CYSTOSCOPY WITH URETHRAL DILATATION N/A 04/20/2013   Procedure: CYSTOSCOPY WITH URETHRAL DILATATION;  Surgeon: Ailene Rud, MD;  Location: [facility];  Service: Urology;  Laterality: N/A;  . CYSTOSCOPY/URETEROSCOPY/HOLMIUM LASER/STENT PLACEMENT Left 06/10/2017   Procedure: CYSTOSCOPY/RETROGRADE/URETEROSCOPY/HOLMIUM LASER/STENT PLACEMENT;  Surgeon: Raynelle Bring, MD;  Location: [facility];  Service: Urology;  Laterality: Left;  . CYSTOSCOPY/URETEROSCOPY/HOLMIUM LASER/STENT PLACEMENT Right 03/09/2019   Procedure: CYSTOSCOPY/RETROGRADE/URETEROSCOPY/HOLMIUM LASER/STENT PLACEMENT;  Surgeon: Raynelle Bring, MD;  Location: [facility];  Service: Urology;  Laterality: Right;  ONLY NEEDS 60 MIN  . ENDOSCOPIC RETROGRADE CHOLANGIOPANCREATOGRAPHY (ERCP) WITH PROPOFOL N/A 04/13/2019   Procedure: ENDOSCOPIC RETROGRADE CHOLANGIOPANCREATOGRAPHY (ERCP) WITH PROPOFOL;  Surgeon: Jackquline Denmark, MD;  Location: [facility];  Service: Endoscopy;  Laterality: N/A;  . EXTRACORPOREAL SHOCK WAVE LITHOTRIPSY  01-17-2007   LEFT  . HERNIA REPAIR  01/30/2019   [facility] Left inguinal hernia repair  . HOLMIUM LASER APPLICATION Left [id number]   Procedure: LASER OF LEFT RENAL PELVIC STONE;  Surgeon: Ailene Rud, MD;  Location: [facility];  Service: Urology;  Laterality: Left;  . INGUINAL HERNIA REPAIR  2003  . JOINT REPLACEMENT     total knee right 03-01-17  . LEFT ACHILLES TENDON REPAIR  1992  . MASS EXCISION N/A 11/14/2018   Procedure: EXCISIONAL BIOPSY OF GLANS PENIS;  Surgeon: Raynelle Bring, MD;  Location: [facility];  Service: Urology;  Laterality: N/A;  ONLY NEEDS 60 MIN  . NASAL SINUS SURGERY  2012  . PENILE SURGERY  OF MEATUS  1955  . REMOVAL OF  STONES  04/13/2019   Procedure: REMOVAL OF STONES;  Surgeon: Jackquline Denmark, MD;  Location: [facility];  Service: Endoscopy;;  . [name]  04/13/2019   Procedure: [name];  Surgeon: Jackquline Denmark, MD;  Location: [facility];  Service: Endoscopy;;  . TRANSTHORACIC ECHOCARDIOGRAM  03-31-2011   NORMAL LVEF  59%/ TRIVIAL MR/ DIASTOLIC DYSFUNCTION/ MODERATE LVH  . TRANSURETHRAL RESECTION OF PROSTATE N/A 04/20/2013   Procedure: TRANSURETHRAL RESECTION OF THE PROSTATE WITH GYRUS INSTRUMENTS;  Surgeon: Ailene Rud, MD;  Location: [facility];  Service: Urology;  Laterality: N/A;    Current Medications: Current Meds  Medication Sig  . apixaban (ELIQUIS) 5 MG TABS tablet Take 1 tablet (5 mg total) by mouth 2 (two) times daily.  Marland Kitchen atorvastatin (LIPITOR) 40 MG tablet Take 20 mg by mouth at bedtime. Taking 20 MG Daily  . benazepril (LOTENSIN) 20 MG tablet Take 20 mg by mouth daily.  . carvedilol (COREG) 25 MG tablet Take 1 tablet (25 mg total) by mouth 2 (two) times daily.  . ferrous sulfate 325 (65 FE) MG tablet Take 325 mg by mouth daily.  . nabumetone (RELAFEN) 500 MG tablet Take 500 mg by mouth  as needed for moderate pain.  . nitroGLYCERIN (NITROSTAT) 0.4 MG SL tablet Place 0.4 mg under the tongue every 5 (five) minutes x 3 doses as needed for chest pain.  . Omega-3 Fatty Acids (OMEGA 3 PO) Take 520 mg by mouth daily.  Marland Kitchen omeprazole (PRILOSEC) 20 MG capsule Take 20 mg by mouth daily.   . potassium chloride (KLOR-CON) 10 MEQ tablet Take 10 mEq by mouth daily.  . Probiotic Product (PROBIOTIC DAILY PO) Take 1 tablet by mouth daily.  . timolol (TIMOPTIC) 0.5 % ophthalmic solution Place 1 drop into both eyes 2 (two) times daily.   Marland Kitchen triamcinolone cream (KENALOG) 0.1 % Apply 1 application topically daily as needed (skin irritation).      Allergies:   Hydrocodone   Social History   Socioeconomic History  . Marital status: Married    Spouse name: Not on file  . Number of children: 2   . Years of education: some college  . Highest education level: Not on file  Occupational History  . Occupation: retired Social research officer, [employer]  Tobacco Use  . Smoking status: Never Smoker  . Smokeless tobacco: Never Used  Vaping Use  . Vaping Use: Never used  Substance and Sexual Activity  . Alcohol use: No  . Drug use: No  . Sexual activity: Yes  Other Topics Concern  . Not on file  Social History Narrative   Lives at home with his wife.   Right-handed.   Caffeine use:  32-40 ounces caffeine per day.   Social Determinants of Health   Financial Resource Strain: Not on file  Food Insecurity: Not on file  Transportation Needs: Not on file  Physical Activity: Not on file  Stress: Not on file  Social Connections: Not on file     Family History: The patient's family history includes Breast cancer in his mother; Cirrhosis in his father; Colon cancer in his maternal aunt. There is no history of Esophageal cancer. ROS:   Please see the history of present illness.    All 14 point review of systems negative except as described per history of present illness  EKGs/Labs/Other Studies Reviewed:      Recent Labs: 08/10/2019: NT-Pro BNP 1,606 02/23/2020: ALT 16; BUN 23; Creatinine, Ser 0.84; Hemoglobin 12.9; Platelets 301.0; Potassium 4.1; Sodium 141  Recent Lipid Panel No results found for: CHOL, TRIG, HDL, CHOLHDL, VLDL, LDLCALC, LDLDIRECT  Physical Exam:    VS:  BP 100/70 (BP Location: Left Arm, Patient Position: Sitting, Cuff Size: Normal)   Pulse 80   Ht 5\' 9"  (1.753 m)   Wt 184 lb (83.5 kg)   SpO2 94%   BMI 27.17 kg/m     Wt Readings from Last 3 Encounters:  06/20/20 184 lb (83.5 kg)  06/03/20 183 lb (83 kg)  02/23/20 182 lb (82.6 kg)     GEN:  Well nourished, well developed in no acute distress HEENT: Normal NECK: No JVD; No carotid bruits LYMPHATICS: No lymphadenopathy CARDIAC: RRR, no murmurs, no rubs, no gallops RESPIRATORY:  Clear to auscultation without rales,  wheezing or rhonchi  ABDOMEN: Soft, non-tender, non-distended MUSCULOSKELETAL:  No edema; No deformity  SKIN: Warm and dry LOWER EXTREMITIES: no swelling NEUROLOGIC:  Alert and oriented x 3 PSYCHIATRIC:  Normal affect   ASSESSMENT:    1. Left ventricular diastolic dysfunction   2. Primary hypertension   3. Coronary artery disease involving native coronary artery of native heart without angina pectoris   4. Palpitations    PLAN:  In order of problems listed above:  1. Left ventricle diastolic dysfunction overall he looks compensated but recently he got bouts of diarrhea which led to dehydration.  I will bring him back to my office in about 3 months for evaluation. 2. Essential hypertension, blood pressure seems to be well controlled we will continue present management. 3. Coronary disease: It is a stable no chest pain tightness squeezing pressure burning chest. 4. Permanent atrial fibrillation.  Rate is controlled overall doing well from that point review.  Overall he seems to be compensated today but is difficult evaluation because of recent bout of diarrhea.  We will bring him back in 3 months for reevaluation. I did review record from the hospital for that visit  Medication Adjustments/Labs and Tests Ordered: Current medicines are reviewed at length with the patient today.  Concerns regarding medicines are outlined above.  No orders of the defined types were placed in this encounter.  Medication changes: No orders of the defined types were placed in this encounter.   Signed, Park Liter, MD, [facility] 06/20/2020 4:54 PM    [place]

## 2020-06-21 DIAGNOSIS — R197 Diarrhea, unspecified: Secondary | ICD-10-CM | POA: Diagnosis not present

## 2020-06-21 DIAGNOSIS — E876 Hypokalemia: Secondary | ICD-10-CM | POA: Diagnosis not present

## 2020-06-21 DIAGNOSIS — E860 Dehydration: Secondary | ICD-10-CM | POA: Diagnosis not present

## 2020-06-21 DIAGNOSIS — G4733 Obstructive sleep apnea (adult) (pediatric): Secondary | ICD-10-CM | POA: Diagnosis not present

## 2020-06-21 DIAGNOSIS — Z7689 Persons encountering health services in other specified circumstances: Secondary | ICD-10-CM | POA: Diagnosis not present

## 2020-07-02 ENCOUNTER — Other Ambulatory Visit: Payer: Self-pay

## 2020-07-02 ENCOUNTER — Ambulatory Visit (INDEPENDENT_AMBULATORY_CARE_PROVIDER_SITE_OTHER): Payer: Medicare PPO | Admitting: Sports Medicine

## 2020-07-02 ENCOUNTER — Encounter: Payer: Self-pay | Admitting: Sports Medicine

## 2020-07-02 DIAGNOSIS — M79674 Pain in right toe(s): Secondary | ICD-10-CM | POA: Diagnosis not present

## 2020-07-02 DIAGNOSIS — I739 Peripheral vascular disease, unspecified: Secondary | ICD-10-CM | POA: Diagnosis not present

## 2020-07-02 DIAGNOSIS — E119 Type 2 diabetes mellitus without complications: Secondary | ICD-10-CM

## 2020-07-02 MED ORDER — NEOMYCIN-POLYMYXIN-HC 3.5-10000-1 OT SOLN: OTIC | 0 refills | Status: DC

## 2020-07-02 NOTE — Progress Notes (Signed)
Subjective: Tresean Mattix is a 75 y.o. male patient seen today in office with complaint of mild pain 2 out of 10 at the right hallux medial border for the last 5 days reports that he got new shoes and is not sure if it is from the shoes denies any swelling redness warmth drainage or any acute symptoms at this time.  Patient Active Problem List   Diagnosis Date Noted  . Benign neoplasm of colon 06/13/2020  . Obesity 06/13/2020  . Kidney stone 06/13/2020  . Endocarditis 06/13/2020  . Encounter for fitting and adjustment of hearing aid 06/13/2020  . Blood in urine 06/13/2020  . Sensorineural hearing loss, bilateral 06/13/2020  . Coronary artery disease   . COVID-19 virus infection 02/2020  . Unsteadiness   . Pre-diabetes   . OSA on CPAP   . Nocturia   . Left ventricular diastolic dysfunction   . Impaired hearing   . Hypertension   . History of kidney stones   . History of colon polyps   . Frequency of urination   . Dysrhythmia   . GERD (gastroesophageal reflux disease)   . DDD (degenerative disc disease), lumbar   . Cancer ([place])   . Asymptomatic gallstones   . Arthritis   . Palpitations 08/10/2019  . Obstructive sleep apnea 06/05/2019  . Obstructive jaundice   . Choledocholithiasis   . Biliary obstruction   . Gait abnormality 08/03/2018  . Paresthesia 07/18/2018  . Dyslipidemia 10/01/2016  . Coronary artery disease involving native coronary artery of native heart without angina pectoris 10/09/2014  . Atrial fibrillation ([place]) 03/08/2014  . Benign prostatic hypertrophy with incomplete bladder emptying 04/20/2013    Current Outpatient Medications on File Prior to Visit  Medication Sig Dispense Refill  . apixaban (ELIQUIS) 5 MG TABS tablet Take 1 tablet (5 mg total) by mouth 2 (two) times daily. 180 tablet 3  . atorvastatin (LIPITOR) 40 MG tablet Take 20 mg by mouth at bedtime. Taking 20 MG Daily    . benazepril (LOTENSIN) 20 MG tablet Take 20 mg by mouth daily.    .  carvedilol (COREG) 25 MG tablet Take 1 tablet (25 mg total) by mouth 2 (two) times daily. 180 tablet 1  . ferrous sulfate 325 (65 FE) MG tablet Take 325 mg by mouth daily.    . hydrochlorothiazide (HYDRODIURIL) 12.5 MG tablet Take 1 tablet (12.5 mg total) by mouth daily. 90 tablet 1  . nabumetone (RELAFEN) 500 MG tablet Take 500 mg by mouth as needed for moderate pain.    . nitroGLYCERIN (NITROSTAT) 0.4 MG SL tablet Place 0.4 mg under the tongue every 5 (five) minutes x 3 doses as needed for chest pain.    . Omega-3 Fatty Acids (OMEGA 3 PO) Take 520 mg by mouth daily.    Marland Kitchen omeprazole (PRILOSEC) 20 MG capsule Take 20 mg by mouth daily.     . potassium chloride (KLOR-CON) 10 MEQ tablet Take 10 mEq by mouth daily.    . Probiotic Product (PROBIOTIC DAILY PO) Take 1 tablet by mouth daily.    . timolol (TIMOPTIC) 0.5 % ophthalmic solution Place 1 drop into both eyes 2 (two) times daily.     Marland Kitchen triamcinolone cream (KENALOG) 0.1 % Apply 1 application topically daily as needed (skin irritation).      No current facility-administered medications on file prior to visit.    Allergies  Allergen Reactions  . Hydrocodone Other (See Comments)    insomnia "awake for 48 hours"  Objective: Physical Exam  General: Well developed, nourished, no acute distress, awake, alert and oriented x 3  Vascular: Dorsalis pedis artery 1/4 bilateral, Posterior tibial artery 0/4 bilateral due to trace edema at ankles bilateral, skin temperature warm to warm proximal to distal bilateral lower extremities, mild varicosities, scant pedal hair present bilateral.  Neurological: Gross sensation present via light touch bilateral.   Dermatological: Skin is warm, dry, and supple bilateral, right hallux nail mildly elongated and thickened with no acute ingrowing noted, there is minimal pain with palpation around the nail no lifting no redness no warmth no swelling no acute signs of action, no callus/corns/hyperkeratotic tissue  present bilateral. No signs of infection bilateral.  Musculoskeletal: Pes planus deformities noted bilateral. Muscular strength within normal limits without painon range of motion. No pain with calf compression bilateral.  Assessment and Plan:  Problem List Items Addressed This Visit   None   Visit Diagnoses    Pain around toenail, right foot    -  Primary   Diabetes mellitus without complication (HCC)       PVD (peripheral vascular disease) ([place])         -Examined patient.  -Discussed likely shoe rubbing great toe gets along the nail -Mechanically debrided and reduced mycotic right great toenail with sterile nail nipper without incident -For preventative measures prescribed Corticosporin solution for patient to use at skin around nail and nail fold of the right hallux after the bath for 1 week -Advised patient to continue to break-in new shoes slowly -Continue with good supportive shoes daily for foot type -Patient to return as scheduled for routine care or sooner if symptoms worsen.  Landis Martins, DPM

## 2020-07-03 DIAGNOSIS — I4891 Unspecified atrial fibrillation: Secondary | ICD-10-CM | POA: Diagnosis not present

## 2020-07-03 DIAGNOSIS — I10 Essential (primary) hypertension: Secondary | ICD-10-CM | POA: Diagnosis not present

## 2020-07-03 DIAGNOSIS — I2510 Atherosclerotic heart disease of native coronary artery without angina pectoris: Secondary | ICD-10-CM | POA: Diagnosis not present

## 2020-07-04 ENCOUNTER — Other Ambulatory Visit: Payer: Self-pay | Admitting: Cardiology

## 2020-07-10 DIAGNOSIS — Z6828 Body mass index (BMI) 28.0-28.9, adult: Secondary | ICD-10-CM | POA: Diagnosis not present

## 2020-07-10 DIAGNOSIS — M79642 Pain in left hand: Secondary | ICD-10-CM | POA: Diagnosis not present

## 2020-07-10 DIAGNOSIS — M79641 Pain in right hand: Secondary | ICD-10-CM | POA: Diagnosis not present

## 2020-07-10 DIAGNOSIS — Z981 Arthrodesis status: Secondary | ICD-10-CM | POA: Diagnosis not present

## 2020-07-12 DIAGNOSIS — M79641 Pain in right hand: Secondary | ICD-10-CM | POA: Diagnosis not present

## 2020-07-12 DIAGNOSIS — Z981 Arthrodesis status: Secondary | ICD-10-CM | POA: Diagnosis not present

## 2020-07-12 DIAGNOSIS — M47812 Spondylosis without myelopathy or radiculopathy, cervical region: Secondary | ICD-10-CM | POA: Diagnosis not present

## 2020-07-12 DIAGNOSIS — R200 Anesthesia of skin: Secondary | ICD-10-CM | POA: Diagnosis not present

## 2020-07-12 DIAGNOSIS — M4312 Spondylolisthesis, cervical region: Secondary | ICD-10-CM | POA: Diagnosis not present

## 2020-07-22 DIAGNOSIS — G4733 Obstructive sleep apnea (adult) (pediatric): Secondary | ICD-10-CM | POA: Diagnosis not present

## 2020-07-29 DIAGNOSIS — M79642 Pain in left hand: Secondary | ICD-10-CM | POA: Diagnosis not present

## 2020-07-29 DIAGNOSIS — M4312 Spondylolisthesis, cervical region: Secondary | ICD-10-CM | POA: Diagnosis not present

## 2020-07-29 DIAGNOSIS — I10 Essential (primary) hypertension: Secondary | ICD-10-CM | POA: Diagnosis not present

## 2020-07-29 DIAGNOSIS — Z139 Encounter for screening, unspecified: Secondary | ICD-10-CM | POA: Diagnosis not present

## 2020-07-29 DIAGNOSIS — Z1331 Encounter for screening for depression: Secondary | ICD-10-CM | POA: Diagnosis not present

## 2020-07-29 DIAGNOSIS — M79641 Pain in right hand: Secondary | ICD-10-CM | POA: Diagnosis not present

## 2020-07-29 DIAGNOSIS — Z0000 Encounter for general adult medical examination without abnormal findings: Secondary | ICD-10-CM | POA: Diagnosis not present

## 2020-08-02 ENCOUNTER — Other Ambulatory Visit: Payer: Self-pay | Admitting: Cardiology

## 2020-08-02 DIAGNOSIS — I10 Essential (primary) hypertension: Secondary | ICD-10-CM | POA: Diagnosis not present

## 2020-08-02 DIAGNOSIS — I4891 Unspecified atrial fibrillation: Secondary | ICD-10-CM | POA: Diagnosis not present

## 2020-08-02 DIAGNOSIS — I2510 Atherosclerotic heart disease of native coronary artery without angina pectoris: Secondary | ICD-10-CM | POA: Diagnosis not present

## 2020-08-02 NOTE — Telephone Encounter (Signed)
Prescription refill request for Eliquis received. Indication:atrial fib Last office visit:5/22 Scr:0.8 Age: 75 Weight:83.5 kg  Prescription refilled

## 2020-08-06 ENCOUNTER — Ambulatory Visit: Payer: Medicare PPO | Admitting: Cardiovascular Disease

## 2020-08-09 ENCOUNTER — Telehealth: Payer: Self-pay

## 2020-08-09 NOTE — Telephone Encounter (Addendum)
   Patient Name: Spencer Cochran  DOB: 18-Aug-1945 MRN: [id number]  Primary Cardiologist: Jenne Campus, MD  Chart reviewed as part of pre-operative protocol coverage. Pt is having 1 tooth extracted with local anesthetic.  IF SIMPLE EXTRACTION/CLEANINGS: Simple dental extractions are considered low risk procedures per guidelines and generally do not require any specific cardiac clearance. It is also generally accepted that for simple extractions and dental cleanings, there is no need to interrupt blood thinner therapy.  SBE prophylaxis is not required for the patient from a cardiac standpoint.  I will route this recommendation and the most recent OV note as requested to the requesting party via [facility] fax function and remove from pre-op pool.  Please call with questions.  Arvil Chaco, PA-C 08/09/2020, 2:30 PM

## 2020-08-09 NOTE — Telephone Encounter (Signed)
   [facility] Pre-operative Risk Assessment    Request for surgical clearance:  What type of surgery is being performed? Tooth Extraction    When is this surgery scheduled? TBD   What type of clearance is required (medical clearance vs. Pharmacy clearance to hold med vs. Both)?Both  Are there any medications that need to be held prior to surgery and how long?Eliquis (holding length not specified)   Practice name and name of physician performing surgery?[facility]    What is your office phone number: [phone]    7.   What is your office fax number: [phone]  8.   Anesthesia type (None, local, MAC, general) ? Local  Provider is requesting pre medication instruction if required    Spencer Cochran [name] 08/09/2020, 10:00 AM  _________________________________________________________________   (provider comments below)

## 2020-08-09 NOTE — Telephone Encounter (Signed)
I s/w the dental office for Dr. Elinor Parkinson. Confirmed the pt will be having only 1 tooth extracted. Dr. Elinor Parkinson would like recommendations to come from our office as to how long safely to hold Eliquis. Pt dental office they did also ask for the most recent ov note to be faxed to them as well. I will update the pre op provider.

## 2020-08-09 NOTE — Telephone Encounter (Signed)
   Patient Name: Spencer Cochran  DOB: 11/17/1945 MRN: [id number]  Primary Cardiologist: Jenne Campus, MD  Chart reviewed as part of pre-operative protocol coverage.   The procedure is for tooth extraction.  Routing back to preoperative call back pool to get additional information regarding the number of teeth to extract to assist with pharmacy recommendations regarding anticoagulation.   I called the [facility] but was unable to reach anyone to clarify. They appear open until 1PM today 7/8/ per online hours.  Please route back to the preop pool with this information once obtained.  Arvil Chaco, PA-C 08/09/2020, 10:26 AM

## 2020-08-21 DIAGNOSIS — G4733 Obstructive sleep apnea (adult) (pediatric): Secondary | ICD-10-CM | POA: Diagnosis not present

## 2020-09-01 DIAGNOSIS — G4733 Obstructive sleep apnea (adult) (pediatric): Secondary | ICD-10-CM | POA: Diagnosis not present

## 2020-09-02 DIAGNOSIS — I2510 Atherosclerotic heart disease of native coronary artery without angina pectoris: Secondary | ICD-10-CM | POA: Diagnosis not present

## 2020-09-02 DIAGNOSIS — I4891 Unspecified atrial fibrillation: Secondary | ICD-10-CM | POA: Diagnosis not present

## 2020-09-02 DIAGNOSIS — I10 Essential (primary) hypertension: Secondary | ICD-10-CM | POA: Diagnosis not present

## 2020-09-10 ENCOUNTER — Encounter: Payer: Self-pay | Admitting: Sports Medicine

## 2020-09-10 ENCOUNTER — Ambulatory Visit (INDEPENDENT_AMBULATORY_CARE_PROVIDER_SITE_OTHER): Payer: Medicare PPO | Admitting: Sports Medicine

## 2020-09-10 ENCOUNTER — Other Ambulatory Visit: Payer: Self-pay

## 2020-09-10 DIAGNOSIS — B351 Tinea unguium: Secondary | ICD-10-CM

## 2020-09-10 DIAGNOSIS — M79674 Pain in right toe(s): Secondary | ICD-10-CM | POA: Diagnosis not present

## 2020-09-10 DIAGNOSIS — E119 Type 2 diabetes mellitus without complications: Secondary | ICD-10-CM

## 2020-09-10 DIAGNOSIS — I739 Peripheral vascular disease, unspecified: Secondary | ICD-10-CM

## 2020-09-10 DIAGNOSIS — M2040 Other hammer toe(s) (acquired), unspecified foot: Secondary | ICD-10-CM

## 2020-09-10 DIAGNOSIS — M79675 Pain in left toe(s): Secondary | ICD-10-CM

## 2020-09-10 NOTE — Progress Notes (Signed)
Subjective: Spencer Cochran is a 75 y.o. male patient seen today in office with complaint of mildly painful thickened and elongated toenails; unable to trim.  Patient admits some rubbing of his toes when he is in shoes and has noticed a little bit of redness at his right fourth toe.  Patient is still on Eliquis blood thinner for history of Cardiac/ A Fib like before with no changes reports that his blood sugar yesterday was 105 last A1c 5.7 and last visit to PCP Dr. Nyra Capes 6 months ago.  Patient Active Problem List   Diagnosis Date Noted   Benign neoplasm of colon 06/13/2020   Obesity 06/13/2020   Kidney stone 06/13/2020   Endocarditis 06/13/2020   Encounter for fitting and adjustment of hearing aid 06/13/2020   Blood in urine 06/13/2020   Sensorineural hearing loss, bilateral 06/13/2020   Coronary artery disease    COVID-19 virus infection 02/2020   Unsteadiness    Pre-diabetes    OSA on CPAP    Nocturia    Left ventricular diastolic dysfunction    Impaired hearing    Hypertension    History of kidney stones    History of colon polyps    Frequency of urination    Dysrhythmia    GERD (gastroesophageal reflux disease)    DDD (degenerative disc disease), lumbar    Cancer ([place])    Asymptomatic gallstones    Arthritis    Palpitations 08/10/2019   Obstructive sleep apnea 06/05/2019   Obstructive jaundice    Choledocholithiasis    Biliary obstruction    Gait abnormality 08/03/2018   Paresthesia 07/18/2018   Dyslipidemia 10/01/2016   Coronary artery disease involving native coronary artery of native heart without angina pectoris 10/09/2014   Atrial fibrillation ([place]) 03/08/2014   Benign prostatic hypertrophy with incomplete bladder emptying 04/20/2013    Current Outpatient Medications on File Prior to Visit  Medication Sig Dispense Refill   apixaban (ELIQUIS) 5 MG TABS tablet TAKE ONE TABLET BY MOUTH TWICE DAILY 180 tablet 1   atorvastatin (LIPITOR) 40 MG tablet Take 20 mg by  mouth at bedtime. Taking 20 MG Daily     benazepril (LOTENSIN) 20 MG tablet Take 20 mg by mouth daily.     carvedilol (COREG) 25 MG tablet Take 1 tablet (25 mg total) by mouth 2 (two) times daily. 180 tablet 1   ferrous sulfate 325 (65 FE) MG tablet Take 325 mg by mouth daily.     hydrochlorothiazide (HYDRODIURIL) 12.5 MG tablet TAKE ONE TABLET BY MOUTH EVERY DAY 90 tablet 2   nabumetone (RELAFEN) 500 MG tablet Take 500 mg by mouth as needed for moderate pain.     neomycin-polymyxin-hydrocortisone (CORTISPORIN) OTIC solution Apply 1-2 drops to right toenail once a day after bath/shower for 1 week 10 mL 0   nitroGLYCERIN (NITROSTAT) 0.4 MG SL tablet Place 0.4 mg under the tongue every 5 (five) minutes x 3 doses as needed for chest pain.     Omega-3 Fatty Acids (OMEGA 3 PO) Take 520 mg by mouth daily.     omeprazole (PRILOSEC) 20 MG capsule Take 20 mg by mouth daily.      potassium chloride (KLOR-CON) 10 MEQ tablet TAKE ONE TABLET BY MOUTH EVERY DAY 90 tablet 2   Probiotic Product (PROBIOTIC DAILY PO) Take 1 tablet by mouth daily.     timolol (TIMOPTIC) 0.5 % ophthalmic solution Place 1 drop into both eyes 2 (two) times daily.      triamcinolone cream (KENALOG)  0.1 % Apply 1 application topically daily as needed (skin irritation).      No current facility-administered medications on file prior to visit.    Allergies  Allergen Reactions   Hydrocodone Other (See Comments)    insomnia "awake for 48 hours"    Objective: Physical Exam  General: Well developed, nourished, no acute distress, awake, alert and oriented x 3  Vascular: Dorsalis pedis artery 1/4 bilateral, Posterior tibial artery 0/4 bilateral due to trace edema at ankles bilateral, skin temperature warm to warm proximal to distal bilateral lower extremities, mild varicosities, scant pedal hair present bilateral.  Neurological: Gross sensation present via light touch bilateral.   Dermatological: Skin is warm, dry, and supple  bilateral, Nails 1-10 are tender, long, thick, and discolored with mild subungal debris, there is minimal keratotic lesion/corn noted to the right fourth toe.  No open lesions present bilateral.  No signs of infection bilateral.  Musculoskeletal: Fourth hammertoe bilateral and pes planus deformities noted bilateral. Muscular strength within normal limits without painon range of motion. No pain with calf compression bilateral.  Assessment and Plan:  Problem List Items Addressed This Visit   None Visit Diagnoses     Pain due to onychomycosis of toenails of both feet    -  Primary   Diabetes mellitus without complication (HCC)       PVD (peripheral vascular disease) ([place])       Hammer toe, unspecified laterality          -Examined patient.  -Re-Discussed treatment options for painful mycotic nails. -Mechanically debrided and reduced mycotic nails with sterile nail nipper and dremel nail file without incident -At no additional charge mechanically debrided corn to the right fourth toe using a sterile 15 blade without incident -Dispensed toe caps for patient to use as directed -Continue with good supportive shoes daily for foot type that do not rub toes -Patient to return as needed nail care in 2-1/2-3 months or sooner if symptoms worsen.  Landis Martins, DPM

## 2020-09-16 DIAGNOSIS — L578 Other skin changes due to chronic exposure to nonionizing radiation: Secondary | ICD-10-CM | POA: Diagnosis not present

## 2020-09-16 DIAGNOSIS — L821 Other seborrheic keratosis: Secondary | ICD-10-CM | POA: Diagnosis not present

## 2020-09-16 DIAGNOSIS — L570 Actinic keratosis: Secondary | ICD-10-CM | POA: Diagnosis not present

## 2020-09-21 DIAGNOSIS — G4733 Obstructive sleep apnea (adult) (pediatric): Secondary | ICD-10-CM | POA: Diagnosis not present

## 2020-10-01 ENCOUNTER — Other Ambulatory Visit: Payer: Self-pay | Admitting: Cardiology

## 2020-10-03 DIAGNOSIS — I2510 Atherosclerotic heart disease of native coronary artery without angina pectoris: Secondary | ICD-10-CM | POA: Diagnosis not present

## 2020-10-03 DIAGNOSIS — I10 Essential (primary) hypertension: Secondary | ICD-10-CM | POA: Diagnosis not present

## 2020-10-03 DIAGNOSIS — I4891 Unspecified atrial fibrillation: Secondary | ICD-10-CM | POA: Diagnosis not present

## 2020-10-08 ENCOUNTER — Ambulatory Visit: Payer: Medicare PPO | Admitting: Cardiology

## 2020-10-09 DIAGNOSIS — H9313 Tinnitus, bilateral: Secondary | ICD-10-CM | POA: Insufficient documentation

## 2020-10-09 DIAGNOSIS — H6123 Impacted cerumen, bilateral: Secondary | ICD-10-CM | POA: Insufficient documentation

## 2020-10-09 DIAGNOSIS — H6122 Impacted cerumen, left ear: Secondary | ICD-10-CM

## 2020-10-09 DIAGNOSIS — Z7189 Other specified counseling: Secondary | ICD-10-CM

## 2020-10-09 HISTORY — DX: Impacted cerumen, left ear: H61.22

## 2020-10-09 HISTORY — DX: Tinnitus, bilateral: H93.13

## 2020-10-09 HISTORY — DX: Other specified counseling: Z71.89

## 2020-10-10 ENCOUNTER — Other Ambulatory Visit: Payer: Self-pay

## 2020-10-10 ENCOUNTER — Ambulatory Visit (INDEPENDENT_AMBULATORY_CARE_PROVIDER_SITE_OTHER): Payer: Medicare PPO | Admitting: Cardiology

## 2020-10-10 ENCOUNTER — Encounter: Payer: Self-pay | Admitting: Cardiology

## 2020-10-10 VITALS — BP 114/68 | HR 72 | Ht 68.5 in | Wt 188.8 lb

## 2020-10-10 DIAGNOSIS — I2510 Atherosclerotic heart disease of native coronary artery without angina pectoris: Secondary | ICD-10-CM

## 2020-10-10 DIAGNOSIS — I10 Essential (primary) hypertension: Secondary | ICD-10-CM

## 2020-10-10 DIAGNOSIS — I519 Heart disease, unspecified: Secondary | ICD-10-CM | POA: Diagnosis not present

## 2020-10-10 DIAGNOSIS — R0602 Shortness of breath: Secondary | ICD-10-CM | POA: Diagnosis not present

## 2020-10-10 DIAGNOSIS — G4733 Obstructive sleep apnea (adult) (pediatric): Secondary | ICD-10-CM | POA: Diagnosis not present

## 2020-10-10 NOTE — Progress Notes (Signed)
Cardiology Office Note:    Date:  10/10/2020   ID:  Spencer Cochran, DOB May 26, 1945, MRN [id number]  PCP:  Maryella Shivers, MD  Cardiologist:  Jenne Campus, MD    Referring MD: Maryella Shivers, MD   No chief complaint on file. Am doing fine  History of Present Illness:    Spencer Cochran is a 75 y.o. male with past medical history significant for remote coronary artery disease, permanent atrial fibrillation, we did have some suspicion for amyloidosis however work-up has been negative.  Also history of dyslipidemia.  He still very active.  He still goes to gym 3 times a week and exercise and regular basis he tells me that his strength is not as good as it used to be but overall he is very happy about the fact he can go to gym.  Denies have any palpitations, no chest pain tightness squeezing pressure burning chest.  He did notice swelling of the lower extremities special evening time and he is concerned about it.  There is no proximal nocturnal dyspnea there is no exertional dyspnea.  Past Medical History:  Diagnosis Date   Arthritis knees and ankle   Asymptomatic gallstones    Atrial fibrillation ([place]) 03/08/2014   Benign neoplasm of colon 06/13/2020   Benign prostatic hypertrophy with incomplete bladder emptying 04/20/2013   Biliary obstruction    Cancer (HCC)    skin    Cataract immature BILATERAL EYES   Choledocholithiasis    Coronary artery disease CARDIOLOGIST-  DR Agustin Cree  [facility])---  LAST VISIT 04-29-2011   DENIES CARDIAC SYMPTOMS   Coronary artery disease involving native coronary artery of native heart without angina pectoris 10/09/2014   Luminal disease but cardiac catheterization from 2014, stress test in the summer of 2018 showed no evidence of ischemia  Formatting of this note might be different from the original. 20% circumflex 20% RCA in July 2014   COVID-19 virus infection 02/2020   DDD (degenerative disc disease), lumbar    Dyslipidemia 10/01/2016   Dysrhythmia     A Fib    Endocarditis 06/13/2020   Apr 25, 2007 Entered By: Mahlon Gammon C Comment: AORTIC STENOSIS, MITRAL AND TRICUSPID REGURGITATION   Frequency of urination    Gait abnormality 08/03/2018   GERD (gastroesophageal reflux disease)    Glaucoma    both eyes   History of colon polyps    History of kidney stones    Hypertension    Impaired hearing has bilateral aids--  but does not wear at all times   Kidney stone 06/13/2020   Apr 25, 2007 Entered By: Mahlon Gammon C Comment: TREATED WITH LITHOTRIPSY   Left ventricular diastolic dysfunction PER ECHO 03-31-2011  W/ CHART   Nocturia    Obstructive jaundice    Obstructive sleep apnea 06/05/2019   OSA on CPAP    wears cpap   Palpitations 08/10/2019   Paresthesia 07/18/2018   Pre-diabetes    last hemaglobin a 1 c was 5.6   Unsteadiness    Urethral stricture     Past Surgical History:  Procedure Laterality Date   APPENDECTOMY  1972   BALLOON DILATION N/A 04/13/2019   Procedure: Larrie Kass DILATION;  Surgeon: Jackquline Denmark, MD;  Location: [facility];  Service: Endoscopy;  Laterality: N/A;   CARDIOVASCULAR STRESS TEST  04-29-2010   DR KRASAWSKI   NO EVIDENCE ISCHEMIA/ NORMAL LVSF AND WALL MOTION/ EF 63%   CERVICAL FUSION  2006   C3 - 6   COLONOSCOPY  03/23/2012  Small colonic polyps, status post polypectomy. Pancolonic diverticulosis predominantly in the left colon.Small internal and external hemorrhoids   COLONOSCOPY  2019   Dr Orlena Sheldon. Diverticulosis. Doesn't think he removed polpys    CYSTO/ URETHRAL DILATION/ TRANSURETHRAL INCISIONOF PROSTATE  01-13-2007   CYSTOSCOPY  2005   CYSTOSCOPY W/ RETROGRADES  06/12/2011   Procedure: CYSTOSCOPY WITH RETROGRADE PYELOGRAM;  Surgeon: Ailene Rud, MD;  Location: [facility];  Service: Urology;  Laterality: N/A;  cysto, urethral dilation, right retrograde pyelogram     CYSTOSCOPY WITH RETROGRADE PYELOGRAM, URETEROSCOPY AND STENT PLACEMENT Left 03/08/2014   Procedure:  CYSTOSCOPY WITH LEFT RETROGRADE PYELOGRAM/LEFT  URETEROSCOPY;  Surgeon: Ailene Rud, MD;  Location: [facility];  Service: Urology;  Laterality: Left;   CYSTOSCOPY WITH URETHRAL DILATATION N/A 04/20/2013   Procedure: CYSTOSCOPY WITH URETHRAL DILATATION;  Surgeon: Ailene Rud, MD;  Location: [facility];  Service: Urology;  Laterality: N/A;   CYSTOSCOPY/URETEROSCOPY/HOLMIUM LASER/STENT PLACEMENT Left 06/10/2017   Procedure: CYSTOSCOPY/RETROGRADE/URETEROSCOPY/HOLMIUM LASER/STENT PLACEMENT;  Surgeon: Raynelle Bring, MD;  Location: [facility];  Service: Urology;  Laterality: Left;   CYSTOSCOPY/URETEROSCOPY/HOLMIUM LASER/STENT PLACEMENT Right 03/09/2019   Procedure: CYSTOSCOPY/RETROGRADE/URETEROSCOPY/HOLMIUM LASER/STENT PLACEMENT;  Surgeon: Raynelle Bring, MD;  [facility];  Service: Urology;  Laterality: Right;  ONLY NEEDS 60 MIN   ENDOSCOPIC RETROGRADE CHOLANGIOPANCREATOGRAPHY (ERCP) WITH PROPOFOL N/A 04/13/2019   Procedure: ENDOSCOPIC RETROGRADE CHOLANGIOPANCREATOGRAPHY (ERCP) WITH PROPOFOL;  Surgeon: Jackquline Denmark, MD;  Location: [facility];  Service: Endoscopy;  Laterality: N/A;   EXTRACORPOREAL SHOCK WAVE LITHOTRIPSY  01-17-2007   LEFT   HERNIA REPAIR  01/30/2019   [facility] Left inguinal hernia repair   HOLMIUM LASER APPLICATION Left [id number]   Procedure: LASER OF LEFT RENAL PELVIC STONE;  Surgeon: Ailene Rud, MD;  Location: [facility];  Service: Urology;  Laterality: Left;   INGUINAL HERNIA REPAIR  2003   JOINT REPLACEMENT     total knee right 03-01-17   LEFT ACHILLES TENDON REPAIR  1992   MASS EXCISION N/A 11/14/2018   Procedure: EXCISIONAL BIOPSY OF GLANS PENIS;  Surgeon: Raynelle Bring, MD;  Location: [facility];  Service: Urology;  Laterality: N/A;  ONLY NEEDS 60 MIN   NASAL SINUS SURGERY  2012   PENILE SURGERY  OF MEATUS  1955   REMOVAL OF STONES  04/13/2019   Procedure: REMOVAL OF STONES;  Surgeon: Jackquline Denmark, MD;  Location: [facility];  Service:  Endoscopy;;   SPHINCTEROTOMY  04/13/2019   Procedure: [name];  Surgeon: Jackquline Denmark, MD;  Location: [facility];  Service: Endoscopy;;   TRANSTHORACIC ECHOCARDIOGRAM  03-31-2011   NORMAL LVEF  59%/ TRIVIAL MR/ DIASTOLIC DYSFUNCTION/ MODERATE LVH   TRANSURETHRAL RESECTION OF PROSTATE N/A 04/20/2013   Procedure: TRANSURETHRAL RESECTION OF THE PROSTATE WITH GYRUS INSTRUMENTS;  Surgeon: Ailene Rud, MD;  Location: [facility];  Service: Urology;  Laterality: N/A;    Current Medications: No outpatient medications have been marked as taking for the 10/10/20 encounter (Appointment) with Park Liter, MD.     Allergies:   Hydrocodone   Social History   Socioeconomic History   Marital status: Married    Spouse name: Not on file   Number of children: 2   Years of education: some college   Highest education level: Not on file  Occupational History   Occupation: retired Social research officer, government  Tobacco Use   Smoking status: Never   Smokeless tobacco: Never  Vaping Use   Vaping Use: Never used  Substance and Sexual Activity  Alcohol use: No   Drug use: No   Sexual activity: Yes  Other Topics Concern   Not on file  Social History Narrative   Lives at home with his wife.   Right-handed.   Caffeine use:  32-40 ounces caffeine per day.   Social Determinants of Health   Financial Resource Strain: Not on file  Food Insecurity: Not on file  Transportation Needs: Not on file  Physical Activity: Not on file  Stress: Not on file  Social Connections: Not on file     Family History: The patient's family history includes Breast cancer in his mother; Cirrhosis in his father; Colon cancer in his maternal aunt. There is no history of Esophageal cancer. ROS:   Please see the history of present illness.    All 14 point review of systems negative except as described per history of present illness  EKGs/Labs/Other Studies Reviewed:      Recent Labs: 02/23/2020: ALT 16; BUN 23;  Creatinine, Ser 0.84; Hemoglobin 12.9; Platelets 301.0; Potassium 4.1; Sodium 141  Recent Lipid Panel No results found for: CHOL, TRIG, HDL, CHOLHDL, VLDL, LDLCALC, LDLDIRECT  Physical Exam:    VS:  There were no vitals taken for this visit.    Wt Readings from Last 3 Encounters:  06/20/20 184 lb (83.5 kg)  06/03/20 183 lb (83 kg)  02/23/20 182 lb (82.6 kg)     GEN:  Well nourished, well developed in no acute distress HEENT: Normal NECK: No JVD; No carotid bruits LYMPHATICS: No lymphadenopathy CARDIAC: Irregularly irregular, no murmurs, no rubs, no gallops RESPIRATORY:  Clear to auscultation without rales, wheezing or rhonchi  ABDOMEN: Soft, non-tender, non-distended MUSCULOSKELETAL:  No edema; No deformity  SKIN: Warm and dry LOWER EXTREMITIES: no swelling NEUROLOGIC:  Alert and oriented x 3 PSYCHIATRIC:  Normal affect   ASSESSMENT:    1. Coronary artery disease involving native coronary artery of native heart without angina pectoris   2. Left ventricular diastolic dysfunction   3. Primary hypertension   4. Obstructive sleep apnea    PLAN:    In order of problems listed above:  Coronary artery disease stable from that point review denies have any chest pain tightness squeezing pressure burning chest. Swelling of lower extremities obviously concerning.  He does have diastolic dysfunction, I will check his proBNP as well as Chem-7 with have echocardiogram done last year if lab work test will be abnormal we may need to repeat echocardiogram. Essential hypertension blood pressure well controlled continue present management. Obstructive sleep apnea followed by antimedicine team Dyslipidemia I did review his K PN which show me his LDL of 50 and HDL 36 this is from April of this year.  He is on Lipitor 40 which I will continue Atrial fibrillation which is permanent.  He is anticoagulated rate is controlled   Medication Adjustments/Labs and Tests Ordered: Current medicines are  reviewed at length with the patient today.  Concerns regarding medicines are outlined above.  No orders of the defined types were placed in this encounter.  Medication changes: No orders of the defined types were placed in this encounter.   Signed, Park Liter, MD, [facility] 10/10/2020 9:33 AM    [place]

## 2020-10-10 NOTE — Patient Instructions (Signed)
Medication Instructions:  Your physician recommends that you continue on your current medications as directed. Please refer to the Current Medication list given to you today.  *If you need a refill on your cardiac medications before your next appointment, please call your pharmacy*   Lab Work: Your physician recommends that you return for lab work in: [place] If you have labs (blood work) drawn today and your tests are completely normal, you will receive your results only by: [place] (if you have [place]) OR A paper copy in the mail If you have any lab test that is abnormal or we need to change your treatment, we will call you to review the results.   Testing/Procedures: None   Follow-Up: At [facility], you and your health needs are our priority.  As part of our continuing mission to provide you with exceptional heart care, we have created designated Provider Care Teams.  These Care Teams include your primary Cardiologist (physician) and Advanced Practice Providers (APPs -  Physician Assistants and Nurse Practitioners) who all work together to provide you with the care you need, when you need it.  We recommend signing up for the patient portal called "MyChart".  Sign up information is provided on this After Visit Summary.  MyChart is used to connect with patients for Virtual Visits (Telemedicine).  Patients are able to view lab/test results, encounter notes, upcoming appointments, etc.  Non-urgent messages can be sent to your provider as well.   To learn more about what you can do with MyChart, go to [email or web address].    Your next appointment:   5 month(s)  The format for your next appointment:   In Person  Provider:   Jenne Campus, MD   Other Instructions

## 2020-10-11 LAB — BASIC METABOLIC PANEL
BUN/Creatinine Ratio: 34 — ABNORMAL HIGH (ref 10–24)
BUN: 28 mg/dL — ABNORMAL HIGH (ref 8–27)
CO2: 28 mmol/L (ref 20–29)
Calcium: 9.1 mg/dL (ref 8.6–10.2)
Chloride: 105 mmol/L (ref 96–106)
Creatinine, Ser: 0.82 mg/dL (ref 0.76–1.27)
Glucose: 101 mg/dL — ABNORMAL HIGH (ref 65–99)
Potassium: 4.6 mmol/L (ref 3.5–5.2)
Sodium: 144 mmol/L (ref 134–144)
eGFR: 91 mL/min/{1.73_m2} (ref >59)

## 2020-10-11 LAB — PRO B NATRIURETIC PEPTIDE: NT-Pro BNP: 729 pg/mL — ABNORMAL HIGH (ref 0–486)

## 2020-10-11 MED ORDER — FUROSEMIDE 20 MG PO TABS: 20.0000 mg | ORAL_TABLET | Freq: Every day | ORAL | 3 refills | Status: DC

## 2020-10-11 NOTE — Addendum Note (Signed)
Addended by: Truddie Hidden on: 10/11/2020 04:55 PM   Modules accepted: Orders

## 2020-10-14 ENCOUNTER — Telehealth: Payer: Self-pay | Admitting: Cardiology

## 2020-10-14 DIAGNOSIS — R7303 Prediabetes: Secondary | ICD-10-CM | POA: Diagnosis not present

## 2020-10-14 DIAGNOSIS — E782 Mixed hyperlipidemia: Secondary | ICD-10-CM | POA: Diagnosis not present

## 2020-10-14 NOTE — Telephone Encounter (Signed)
   Pt said he supposed to discontinued one of his meds but cannot remember which meds.

## 2020-10-14 NOTE — Telephone Encounter (Signed)
You had told this pt to stop chlorthalidone and start lasix due to lab but pt is not taking chlorthalidone. How do you advise?

## 2020-10-15 ENCOUNTER — Telehealth: Payer: Self-pay | Admitting: Cardiology

## 2020-10-15 NOTE — Telephone Encounter (Signed)
Pt called yesterday with same question. Last message routed to Dr. Agustin Cree, awaiting response. Pt aware.

## 2020-10-15 NOTE — Telephone Encounter (Signed)
Pt c/o medication issue:  1. Name of Medication: furosemide (LASIX) 20 MG tablet  2. How are you currently taking this medication (dosage and times per day)? As directed  3. Are you having a reaction (difficulty breathing--STAT)? no  4. What is your medication issue? Patient thought when he started this medication he was supposed to stop another medication, but he can not remember which one it was

## 2020-10-16 NOTE — Telephone Encounter (Signed)
Recommendations reviewed with pt as per Dr. Krasowski's note.  Pt verbalized understanding and had no additional questions.  

## 2020-10-21 DIAGNOSIS — E782 Mixed hyperlipidemia: Secondary | ICD-10-CM | POA: Diagnosis not present

## 2020-10-21 DIAGNOSIS — I5030 Unspecified diastolic (congestive) heart failure: Secondary | ICD-10-CM | POA: Diagnosis not present

## 2020-10-21 DIAGNOSIS — I482 Chronic atrial fibrillation, unspecified: Secondary | ICD-10-CM | POA: Diagnosis not present

## 2020-10-21 DIAGNOSIS — R42 Dizziness and giddiness: Secondary | ICD-10-CM | POA: Diagnosis not present

## 2020-10-22 DIAGNOSIS — G4733 Obstructive sleep apnea (adult) (pediatric): Secondary | ICD-10-CM | POA: Diagnosis not present

## 2020-11-02 DIAGNOSIS — I10 Essential (primary) hypertension: Secondary | ICD-10-CM | POA: Diagnosis not present

## 2020-11-02 DIAGNOSIS — I4891 Unspecified atrial fibrillation: Secondary | ICD-10-CM | POA: Diagnosis not present

## 2020-11-02 DIAGNOSIS — I2510 Atherosclerotic heart disease of native coronary artery without angina pectoris: Secondary | ICD-10-CM | POA: Diagnosis not present

## 2020-11-06 ENCOUNTER — Telehealth: Payer: Self-pay | Admitting: Cardiology

## 2020-11-06 MED ORDER — CARVEDILOL 12.5 MG PO TABS: 12.5000 mg | ORAL_TABLET | Freq: Two times a day (BID) | ORAL | 2 refills | Status: DC

## 2020-11-06 NOTE — Addendum Note (Signed)
Addended by: Senaida Ores on: 11/06/2020 02:54 PM   Modules accepted: Orders

## 2020-11-06 NOTE — Telephone Encounter (Signed)
Pt c/o medication issue:  1. Name of Medication:  carvedilol (COREG) 25 MG tablet  2. How are you currently taking this medication (dosage and times per day)? N/A   3. Are you having a reaction (difficulty breathing--STAT)? N/A  4. What is your medication issue? [facility] is calling stating Richardson Landry called wanting to get a prescription of this medication, but advised he thought it was supposed to be a new prescription coming in for a 1/2 a tablet instead. Requesting callback with confirmation.

## 2020-11-06 NOTE — Telephone Encounter (Signed)
Spoke to pharmacy. Informed them we will do carvedilol 12.5 twice daily for patient. They understood new script sent in. Patient aware.

## 2020-11-07 DIAGNOSIS — M19012 Primary osteoarthritis, left shoulder: Secondary | ICD-10-CM | POA: Diagnosis not present

## 2020-11-15 DIAGNOSIS — I482 Chronic atrial fibrillation, unspecified: Secondary | ICD-10-CM | POA: Diagnosis not present

## 2020-11-15 DIAGNOSIS — I10 Essential (primary) hypertension: Secondary | ICD-10-CM | POA: Diagnosis not present

## 2020-11-15 DIAGNOSIS — Z6828 Body mass index (BMI) 28.0-28.9, adult: Secondary | ICD-10-CM | POA: Diagnosis not present

## 2020-11-15 DIAGNOSIS — R42 Dizziness and giddiness: Secondary | ICD-10-CM | POA: Diagnosis not present

## 2020-11-19 ENCOUNTER — Encounter: Payer: Self-pay | Admitting: Sports Medicine

## 2020-11-19 ENCOUNTER — Other Ambulatory Visit: Payer: Self-pay

## 2020-11-19 ENCOUNTER — Ambulatory Visit (INDEPENDENT_AMBULATORY_CARE_PROVIDER_SITE_OTHER): Payer: Medicare PPO | Admitting: Sports Medicine

## 2020-11-19 DIAGNOSIS — M79675 Pain in left toe(s): Secondary | ICD-10-CM | POA: Diagnosis not present

## 2020-11-19 DIAGNOSIS — B351 Tinea unguium: Secondary | ICD-10-CM

## 2020-11-19 DIAGNOSIS — E119 Type 2 diabetes mellitus without complications: Secondary | ICD-10-CM

## 2020-11-19 DIAGNOSIS — I739 Peripheral vascular disease, unspecified: Secondary | ICD-10-CM

## 2020-11-19 DIAGNOSIS — M79674 Pain in right toe(s): Secondary | ICD-10-CM

## 2020-11-19 NOTE — Progress Notes (Signed)
Subjective: Spencer Cochran is a 75 y.o. male patient seen today in office with complaint of mildly painful thickened and elongated toenails; unable to trim.  Patient admits that he wore the toe caps in the first day they stretched and he got a hole in them so had to come back by the office to get new toe caps which he has not tried to wear yet.  Patient also states that his blood sugar was 98 this morning last A1c 5.8 and last visit to PCP Dr. Nyra Capes was 1 week ago.  Patient Active Problem List   Diagnosis Date Noted   Impacted cerumen, left ear 10/09/2020   Other specified counseling 10/09/2020   Tinnitus, bilateral 10/09/2020   Benign neoplasm of colon 06/13/2020   Obesity 06/13/2020   Kidney stone 06/13/2020   Endocarditis 06/13/2020   Encounter for fitting and adjustment of hearing aid 06/13/2020   Blood in urine 06/13/2020   Sensorineural hearing loss, bilateral 06/13/2020   Coronary artery disease    COVID-19 virus infection 02/2020   Unsteadiness    Pre-diabetes    OSA on CPAP    Nocturia    Left ventricular diastolic dysfunction    Impaired hearing    Hypertension    History of kidney stones    History of colon polyps    Frequency of urination    Dysrhythmia    GERD (gastroesophageal reflux disease)    DDD (degenerative disc disease), lumbar    Cancer ([place])    Asymptomatic gallstones    Arthritis    Palpitations 08/10/2019   Obstructive sleep apnea 06/05/2019   Obstructive jaundice    Choledocholithiasis    Biliary obstruction    Gait abnormality 08/03/2018   Paresthesia 07/18/2018   Dyslipidemia 10/01/2016   Coronary artery disease involving native coronary artery of native heart without angina pectoris 10/09/2014   Atrial fibrillation ([place]) 03/08/2014   Benign prostatic hypertrophy with incomplete bladder emptying 04/20/2013    Current Outpatient Medications on File Prior to Visit  Medication Sig Dispense Refill   apixaban (ELIQUIS) 5 MG TABS tablet TAKE ONE  TABLET BY MOUTH TWICE DAILY (Patient taking differently: Take 5 mg by mouth 2 (two) times daily.) 180 tablet 1   atorvastatin (LIPITOR) 40 MG tablet Take 20 mg by mouth at bedtime. Taking 20 MG Daily     benazepril (LOTENSIN) 20 MG tablet Take 20 mg by mouth daily.     carvedilol (COREG) 12.5 MG tablet Take 1 tablet (12.5 mg total) by mouth 2 (two) times daily. 60 tablet 2   ferrous sulfate 325 (65 FE) MG tablet Take 325 mg by mouth daily.     furosemide (LASIX) 20 MG tablet Take 1 tablet (20 mg total) by mouth daily. 90 tablet 3   hydrochlorothiazide (HYDRODIURIL) 12.5 MG tablet TAKE ONE TABLET BY MOUTH EVERY DAY (Patient taking differently: Take 12.5 mg by mouth daily.) 90 tablet 2   nitroGLYCERIN (NITROSTAT) 0.4 MG SL tablet Place 0.4 mg under the tongue every 5 (five) minutes x 3 doses as needed for chest pain.     Omega-3 Fatty Acids (OMEGA 3 PO) Take 520 mg by mouth daily.     omeprazole (PRILOSEC) 20 MG capsule Take 20 mg by mouth daily.      potassium chloride (KLOR-CON) 10 MEQ tablet TAKE ONE TABLET BY MOUTH EVERY DAY (Patient taking differently: Take 10 mEq by mouth daily.) 90 tablet 2   Probiotic Product (PROBIOTIC DAILY PO) Take 1 tablet by mouth daily.  Unknown strength     timolol (TIMOPTIC) 0.5 % ophthalmic solution Place 1 drop into both eyes 2 (two) times daily.      triamcinolone cream (KENALOG) 0.1 % Apply 1 application topically daily as needed (skin irritation).      No current facility-administered medications on file prior to visit.    Allergies  Allergen Reactions   Hydrocodone Other (See Comments)    insomnia "awake for 48 hours"    Objective: Physical Exam  General: Well developed, nourished, no acute distress, awake, alert and oriented x 3  Vascular: Dorsalis pedis artery 1/4 bilateral, Posterior tibial artery 0/4 bilateral due to trace edema at ankles bilateral, skin temperature warm to warm proximal to distal bilateral lower extremities, mild varicosities,  scant pedal hair present bilateral.  Neurological: Gross sensation present via light touch bilateral.   Dermatological: Skin is warm, dry, and supple bilateral, Nails 1-10 are tender, long, thick, and discolored with mild subungal debris, there is minimal keratotic lesion/corn noted to the right fourth toe.  No open lesions present bilateral.  No signs of infection bilateral.  Musculoskeletal: Fourth hammertoe bilateral and pes planus deformities noted bilateral. Muscular strength within normal limits without painon range of motion. No pain with calf compression bilateral.  Assessment and Plan:  Problem List Items Addressed This Visit   None Visit Diagnoses     Pain due to onychomycosis of toenails of both feet    -  Primary   Diabetes mellitus without complication (HCC)       PVD (peripheral vascular disease) ([place])          -Examined patient.  -Re-Discussed treatment options for painful mycotic nails. -Mechanically debrided and reduced mycotic nails with sterile nail nipper and dremel nail file without incident -Fitted patient for toe caps for patient to use as directed and advised patient that doing a lot of walking and standing plus using them with his compression garments may have been too much -Continue with good supportive shoes daily for foot type that do not rub toes like previous -Patient to return as needed nail care in 2.5 months or sooner if symptoms worsen.  Landis Martins, DPM

## 2020-11-21 DIAGNOSIS — G4733 Obstructive sleep apnea (adult) (pediatric): Secondary | ICD-10-CM | POA: Diagnosis not present

## 2020-11-26 DIAGNOSIS — M4156 Other secondary scoliosis, lumbar region: Secondary | ICD-10-CM | POA: Diagnosis not present

## 2020-11-26 DIAGNOSIS — M545 Low back pain, unspecified: Secondary | ICD-10-CM | POA: Diagnosis not present

## 2020-11-28 DIAGNOSIS — Z6828 Body mass index (BMI) 28.0-28.9, adult: Secondary | ICD-10-CM | POA: Diagnosis not present

## 2020-11-28 DIAGNOSIS — L739 Follicular disorder, unspecified: Secondary | ICD-10-CM | POA: Diagnosis not present

## 2020-11-30 DIAGNOSIS — G4733 Obstructive sleep apnea (adult) (pediatric): Secondary | ICD-10-CM | POA: Diagnosis not present

## 2020-12-03 DIAGNOSIS — I4891 Unspecified atrial fibrillation: Secondary | ICD-10-CM | POA: Diagnosis not present

## 2020-12-03 DIAGNOSIS — I2510 Atherosclerotic heart disease of native coronary artery without angina pectoris: Secondary | ICD-10-CM | POA: Diagnosis not present

## 2020-12-03 DIAGNOSIS — I10 Essential (primary) hypertension: Secondary | ICD-10-CM | POA: Diagnosis not present

## 2020-12-23 DIAGNOSIS — M19012 Primary osteoarthritis, left shoulder: Secondary | ICD-10-CM | POA: Diagnosis not present

## 2021-01-02 DIAGNOSIS — I2510 Atherosclerotic heart disease of native coronary artery without angina pectoris: Secondary | ICD-10-CM | POA: Diagnosis not present

## 2021-01-02 DIAGNOSIS — I4891 Unspecified atrial fibrillation: Secondary | ICD-10-CM | POA: Diagnosis not present

## 2021-01-02 DIAGNOSIS — I10 Essential (primary) hypertension: Secondary | ICD-10-CM | POA: Diagnosis not present

## 2021-01-24 ENCOUNTER — Ambulatory Visit (INDEPENDENT_AMBULATORY_CARE_PROVIDER_SITE_OTHER): Payer: Medicare PPO | Admitting: Sports Medicine

## 2021-01-24 ENCOUNTER — Encounter: Payer: Self-pay | Admitting: Sports Medicine

## 2021-01-24 DIAGNOSIS — I739 Peripheral vascular disease, unspecified: Secondary | ICD-10-CM

## 2021-01-24 DIAGNOSIS — B351 Tinea unguium: Secondary | ICD-10-CM

## 2021-01-24 DIAGNOSIS — E119 Type 2 diabetes mellitus without complications: Secondary | ICD-10-CM

## 2021-01-24 DIAGNOSIS — M79674 Pain in right toe(s): Secondary | ICD-10-CM | POA: Diagnosis not present

## 2021-01-24 DIAGNOSIS — M79675 Pain in left toe(s): Secondary | ICD-10-CM | POA: Diagnosis not present

## 2021-01-24 NOTE — Progress Notes (Signed)
Subjective: Spencer Cochran is a 75 y.o. male patient seen today in office with complaint of mildly painful thickened and elongated toenails; unable to trim.  Patient reports that his last blood sugar was 99.  No other pedal complaints noted..  Patient Active Problem List   Diagnosis Date Noted   Impacted cerumen, left ear 10/09/2020   Other specified counseling 10/09/2020   Tinnitus, bilateral 10/09/2020   Benign neoplasm of colon 06/13/2020   Obesity 06/13/2020   Kidney stone 06/13/2020   Endocarditis 06/13/2020   Encounter for fitting and adjustment of hearing aid 06/13/2020   Blood in urine 06/13/2020   Sensorineural hearing loss, bilateral 06/13/2020   Coronary artery disease    COVID-19 virus infection 02/2020   Unsteadiness    Pre-diabetes    OSA on CPAP    Nocturia    Left ventricular diastolic dysfunction    Impaired hearing    Hypertension    History of kidney stones    History of colon polyps    Frequency of urination    Dysrhythmia    GERD (gastroesophageal reflux disease)    DDD (degenerative disc disease), lumbar    Cancer ([place])    Asymptomatic gallstones    Arthritis    Palpitations 08/10/2019   Obstructive sleep apnea 06/05/2019   Obstructive jaundice    Choledocholithiasis    Biliary obstruction    Gait abnormality 08/03/2018   Paresthesia 07/18/2018   Dyslipidemia 10/01/2016   Coronary artery disease involving native coronary artery of native heart without angina pectoris 10/09/2014   Atrial fibrillation ([place]) 03/08/2014   Benign prostatic hypertrophy with incomplete bladder emptying 04/20/2013    Current Outpatient Medications on File Prior to Visit  Medication Sig Dispense Refill   apixaban (ELIQUIS) 5 MG TABS tablet TAKE ONE TABLET BY MOUTH TWICE DAILY (Patient taking differently: Take 5 mg by mouth 2 (two) times daily.) 180 tablet 1   atorvastatin (LIPITOR) 40 MG tablet Take 20 mg by mouth at bedtime. Taking 20 MG Daily     benazepril (LOTENSIN)  20 MG tablet Take 20 mg by mouth daily.     carvedilol (COREG) 12.5 MG tablet Take 1 tablet (12.5 mg total) by mouth 2 (two) times daily. 60 tablet 2   ferrous sulfate 325 (65 FE) MG tablet Take 325 mg by mouth daily.     furosemide (LASIX) 20 MG tablet Take 1 tablet (20 mg total) by mouth daily. 90 tablet 3   hydrochlorothiazide (HYDRODIURIL) 12.5 MG tablet TAKE ONE TABLET BY MOUTH EVERY DAY (Patient taking differently: Take 12.5 mg by mouth daily.) 90 tablet 2   nitroGLYCERIN (NITROSTAT) 0.4 MG SL tablet Place 0.4 mg under the tongue every 5 (five) minutes x 3 doses as needed for chest pain.     Omega-3 Fatty Acids (OMEGA 3 PO) Take 520 mg by mouth daily.     omeprazole (PRILOSEC) 20 MG capsule Take 20 mg by mouth daily.      potassium chloride (KLOR-CON) 10 MEQ tablet TAKE ONE TABLET BY MOUTH EVERY DAY (Patient taking differently: Take 10 mEq by mouth daily.) 90 tablet 2   Probiotic Product (PROBIOTIC DAILY PO) Take 1 tablet by mouth daily. Unknown strength     timolol (TIMOPTIC) 0.5 % ophthalmic solution Place 1 drop into both eyes 2 (two) times daily.      triamcinolone cream (KENALOG) 0.1 % Apply 1 application topically daily as needed (skin irritation).      No current facility-administered medications on file prior  to visit.    Allergies  Allergen Reactions   Hydrocodone Other (See Comments)    insomnia "awake for 48 hours"    Objective: Physical Exam  General: Well developed, nourished, no acute distress, awake, alert and oriented x 3  Vascular: Dorsalis pedis artery 1/4 bilateral, Posterior tibial artery 0/4 bilateral due to trace edema at ankles bilateral.  Neurological: Gross sensation present via light touch bilateral.   Dermatological: Skin is warm, dry, and supple bilateral, Nails 1-10 are tender, long, thick, and discolored with mild subungal debris, there is minimal keratotic lesion/corn noted to the right fourth toe.  No open lesions present bilateral.  No signs of  infection bilateral.  Musculoskeletal: Fourth hammertoe bilateral and pes planus deformities noted bilateral. Muscular strength within normal limits without painon range of motion. No pain with calf compression bilateral.  Assessment and Plan:  Problem List Items Addressed This Visit   None Visit Diagnoses     Pain due to onychomycosis of toenails of both feet    -  Primary   Diabetes mellitus without complication (HCC)       PVD (peripheral vascular disease) ([place])          -Examined patient.  -Re-Discussed treatment options for painful mycotic nails. -Mechanically debrided and reduced mycotic nails with sterile nail nipper and dremel nail file without incident -Patient to return as needed nail care in 2.5 months or sooner if symptoms worsen.  Landis Martins, DPM

## 2021-02-02 DIAGNOSIS — I482 Chronic atrial fibrillation, unspecified: Secondary | ICD-10-CM | POA: Diagnosis not present

## 2021-02-02 DIAGNOSIS — I10 Essential (primary) hypertension: Secondary | ICD-10-CM | POA: Diagnosis not present

## 2021-02-02 DIAGNOSIS — I2510 Atherosclerotic heart disease of native coronary artery without angina pectoris: Secondary | ICD-10-CM | POA: Diagnosis not present

## 2021-02-02 DIAGNOSIS — R7303 Prediabetes: Secondary | ICD-10-CM | POA: Diagnosis not present

## 2021-02-05 DIAGNOSIS — R195 Other fecal abnormalities: Secondary | ICD-10-CM | POA: Diagnosis not present

## 2021-02-05 DIAGNOSIS — R6889 Other general symptoms and signs: Secondary | ICD-10-CM | POA: Diagnosis not present

## 2021-02-05 DIAGNOSIS — D649 Anemia, unspecified: Secondary | ICD-10-CM | POA: Diagnosis not present

## 2021-02-05 DIAGNOSIS — R42 Dizziness and giddiness: Secondary | ICD-10-CM | POA: Diagnosis not present

## 2021-02-06 ENCOUNTER — Other Ambulatory Visit: Payer: Self-pay | Admitting: Cardiology

## 2021-02-10 DIAGNOSIS — M19012 Primary osteoarthritis, left shoulder: Secondary | ICD-10-CM | POA: Diagnosis not present

## 2021-02-17 ENCOUNTER — Encounter: Payer: Self-pay | Admitting: Cardiology

## 2021-02-19 DIAGNOSIS — Z6827 Body mass index (BMI) 27.0-27.9, adult: Secondary | ICD-10-CM | POA: Diagnosis not present

## 2021-02-19 DIAGNOSIS — Z139 Encounter for screening, unspecified: Secondary | ICD-10-CM | POA: Diagnosis not present

## 2021-02-19 DIAGNOSIS — R42 Dizziness and giddiness: Secondary | ICD-10-CM | POA: Diagnosis not present

## 2021-03-03 DIAGNOSIS — M6281 Muscle weakness (generalized): Secondary | ICD-10-CM | POA: Diagnosis not present

## 2021-03-03 DIAGNOSIS — R2681 Unsteadiness on feet: Secondary | ICD-10-CM | POA: Diagnosis not present

## 2021-03-03 DIAGNOSIS — R42 Dizziness and giddiness: Secondary | ICD-10-CM | POA: Diagnosis not present

## 2021-03-05 DIAGNOSIS — I10 Essential (primary) hypertension: Secondary | ICD-10-CM | POA: Diagnosis not present

## 2021-03-05 DIAGNOSIS — I482 Chronic atrial fibrillation, unspecified: Secondary | ICD-10-CM | POA: Diagnosis not present

## 2021-03-05 DIAGNOSIS — I2510 Atherosclerotic heart disease of native coronary artery without angina pectoris: Secondary | ICD-10-CM | POA: Diagnosis not present

## 2021-03-05 DIAGNOSIS — R7303 Prediabetes: Secondary | ICD-10-CM | POA: Diagnosis not present

## 2021-03-08 ENCOUNTER — Other Ambulatory Visit: Payer: Self-pay | Admitting: Cardiology

## 2021-03-10 NOTE — Telephone Encounter (Signed)
Prescription refill request for Eliquis received. Indication:Afib Last office visit:9/22 Scr:0.8 Age: 76 Weight:85.6 kg  Prescription refilled

## 2021-03-11 ENCOUNTER — Other Ambulatory Visit: Payer: Self-pay

## 2021-03-11 DIAGNOSIS — M545 Low back pain, unspecified: Secondary | ICD-10-CM | POA: Insufficient documentation

## 2021-03-11 DIAGNOSIS — R2681 Unsteadiness on feet: Secondary | ICD-10-CM | POA: Diagnosis not present

## 2021-03-11 DIAGNOSIS — M6281 Muscle weakness (generalized): Secondary | ICD-10-CM | POA: Diagnosis not present

## 2021-03-11 DIAGNOSIS — R42 Dizziness and giddiness: Secondary | ICD-10-CM | POA: Diagnosis not present

## 2021-03-11 HISTORY — DX: Low back pain, unspecified: M54.50

## 2021-03-12 ENCOUNTER — Ambulatory Visit (INDEPENDENT_AMBULATORY_CARE_PROVIDER_SITE_OTHER): Payer: Medicare PPO | Admitting: Cardiology

## 2021-03-12 ENCOUNTER — Encounter: Payer: Self-pay | Admitting: Cardiology

## 2021-03-12 ENCOUNTER — Other Ambulatory Visit: Payer: Self-pay

## 2021-03-12 VITALS — Ht 68.0 in | Wt 187.4 lb

## 2021-03-12 DIAGNOSIS — I10 Essential (primary) hypertension: Secondary | ICD-10-CM

## 2021-03-12 DIAGNOSIS — I2510 Atherosclerotic heart disease of native coronary artery without angina pectoris: Secondary | ICD-10-CM | POA: Diagnosis not present

## 2021-03-12 DIAGNOSIS — E785 Hyperlipidemia, unspecified: Secondary | ICD-10-CM | POA: Diagnosis not present

## 2021-03-12 DIAGNOSIS — I519 Heart disease, unspecified: Secondary | ICD-10-CM

## 2021-03-12 DIAGNOSIS — I4821 Permanent atrial fibrillation: Secondary | ICD-10-CM

## 2021-03-12 MED ORDER — BENAZEPRIL HCL 20 MG PO TABS: 20.0000 mg | ORAL_TABLET | Freq: Every day | ORAL | 3 refills | Status: DC

## 2021-03-12 MED ORDER — BENAZEPRIL HCL 10 MG PO TABS: 10.0000 mg | ORAL_TABLET | Freq: Every day | ORAL | 3 refills | Status: DC

## 2021-03-12 NOTE — Progress Notes (Signed)
Cardiology Office Note:    Date:  03/12/2021   ID:  Colonel Krauser, DOB [birth year], MRN [id number]  PCP:  Maryella Shivers, MD  Cardiologist:  Jenne Campus, MD    Referring MD: Maryella Shivers, MD   Chief Complaint  Patient presents with   Dizziness    Ongoing for 6 months  Occur when sitting to stand mostly    History of Present Illness:    Spencer Cochran is a 76 y.o. male past medical history significant for remote coronary artery disease, permanent atrial fibrillation, diastolic congestive heart failure.  There was some suspicion for amyloidosis although work-up has been negative she also have history of dyslipidemia.  She is always very active she goes to gym 3 times a day and does all exercises he likes to do.  However he noticed when he gets up very quickly he gets dizzy and truly he is orthostatic on the physical exam today.  He never fell down did not pass down because of this.  Denies having chest pain tightness squeezing pressure in chest.  Still have some swelling of lower extremities  Past Medical History:  Diagnosis Date   Arthritis knees and ankle   Asymptomatic gallstones    Atrial fibrillation ([place]) 03/08/2014   Benign neoplasm of colon 06/13/2020   Benign prostatic hypertrophy with incomplete bladder emptying 04/20/2013   Biliary obstruction    Cancer (HCC)    skin    Cataract immature BILATERAL EYES   Choledocholithiasis    Coronary artery disease CARDIOLOGIST-  DR Agustin Cree  [facility])---  LAST VISIT 04-29-2011   DENIES CARDIAC SYMPTOMS   Coronary artery disease involving native coronary artery of native heart without angina pectoris 10/09/2014   Luminal disease but cardiac catheterization from 2014, stress test in the summer of 2018 showed no evidence of ischemia  Formatting of this note might be different from the original. 20% circumflex 20% RCA in July 2014   COVID-19 virus infection 02/2020   DDD (degenerative disc disease), lumbar    Dyslipidemia  10/01/2016   Dysrhythmia    A Fib    Endocarditis 06/13/2020   Apr 25, 2007 Entered By: Mahlon Gammon C Comment: AORTIC STENOSIS, MITRAL AND TRICUSPID REGURGITATION   Frequency of urination    Gait abnormality 08/03/2018   GERD (gastroesophageal reflux disease)    Glaucoma    both eyes   History of colon polyps    History of kidney stones    Hypertension    Impaired hearing has bilateral aids--  but does not wear at all times   Kidney stone 06/13/2020   Apr 25, 2007 Entered By: Mahlon Gammon C Comment: TREATED WITH LITHOTRIPSY   Left ventricular diastolic dysfunction PER ECHO 03-31-2011  W/ CHART   Low back pain, unspecified 03/11/2021   Nocturia    Obstructive jaundice    Obstructive sleep apnea 06/05/2019   OSA on CPAP    wears cpap   Palpitations 08/10/2019   Paresthesia 07/18/2018   Pre-diabetes    last hemaglobin a 1 c was 5.6   Unsteadiness    Urethral stricture     Past Surgical History:  Procedure Laterality Date   APPENDECTOMY  1972   BALLOON DILATION N/A 04/13/2019   Procedure: Larrie Kass DILATION;  Surgeon: Jackquline Denmark, MD;  Location: [facility];  Service: Endoscopy;  Laterality: N/A;   CARDIOVASCULAR STRESS TEST  04-29-2010   DR KRASAWSKI   NO EVIDENCE ISCHEMIA/ NORMAL LVSF AND WALL MOTION/ EF 63%   CERVICAL FUSION  2006  C3 - 6   COLONOSCOPY  03/23/2012   Small colonic polyps, status post polypectomy. Pancolonic diverticulosis predominantly in the left colon.Small internal and external hemorrhoids   COLONOSCOPY  2019   Dr Orlena Sheldon. Diverticulosis. Doesn't think he removed polpys    CYSTO/ URETHRAL DILATION/ TRANSURETHRAL INCISIONOF PROSTATE  01-13-2007   CYSTOSCOPY  2005   CYSTOSCOPY W/ RETROGRADES  06/12/2011   Procedure: CYSTOSCOPY WITH RETROGRADE PYELOGRAM;  Surgeon: Ailene Rud, MD;  Location: [facility];  Service: Urology;  Laterality: N/A;  cysto, urethral dilation, right retrograde pyelogram     CYSTOSCOPY WITH RETROGRADE PYELOGRAM,  URETEROSCOPY AND STENT PLACEMENT Left 03/08/2014   Procedure: CYSTOSCOPY WITH LEFT RETROGRADE PYELOGRAM/LEFT  URETEROSCOPY;  Surgeon: Ailene Rud, MD;  Location: [facility];  Service: Urology;  Laterality: Left;   CYSTOSCOPY WITH URETHRAL DILATATION N/A 04/20/2013   Procedure: CYSTOSCOPY WITH URETHRAL DILATATION;  Surgeon: Ailene Rud, MD;  Location: [facility];  Service: Urology;  Laterality: N/A;   CYSTOSCOPY/URETEROSCOPY/HOLMIUM LASER/STENT PLACEMENT Left 06/10/2017   Procedure: CYSTOSCOPY/RETROGRADE/URETEROSCOPY/HOLMIUM LASER/STENT PLACEMENT;  Surgeon: Raynelle Bring, MD;  Location: [facility];  Service: Urology;  Laterality: Left;   CYSTOSCOPY/URETEROSCOPY/HOLMIUM LASER/STENT PLACEMENT Right 03/09/2019   Procedure: CYSTOSCOPY/RETROGRADE/URETEROSCOPY/HOLMIUM LASER/STENT PLACEMENT;  Surgeon: Raynelle Bring, MD;  Location: [facility];  Service: Urology;  Laterality: Right;  ONLY NEEDS 60 MIN   ENDOSCOPIC RETROGRADE CHOLANGIOPANCREATOGRAPHY (ERCP) WITH PROPOFOL N/A 04/13/2019   Procedure: ENDOSCOPIC RETROGRADE CHOLANGIOPANCREATOGRAPHY (ERCP) WITH PROPOFOL;  Surgeon: Jackquline Denmark, MD;  Location: [facility];  Service: Endoscopy;  Laterality: N/A;   EXTRACORPOREAL SHOCK WAVE LITHOTRIPSY  01-17-2007   LEFT   HERNIA REPAIR  01/30/2019   [facility] Left inguinal hernia repair   HOLMIUM LASER APPLICATION Left 07/06/4032   Procedure: LASER OF LEFT RENAL PELVIC STONE;  Surgeon: Ailene Rud, MD;  Location: [facility];  Service: Urology;  Laterality: Left;   INGUINAL HERNIA REPAIR  2003   JOINT REPLACEMENT     total knee right 03-01-17   LEFT ACHILLES TENDON REPAIR  1992   MASS EXCISION N/A 11/14/2018   Procedure: EXCISIONAL BIOPSY OF GLANS PENIS;  Surgeon: Raynelle Bring, MD;  Location: [facility];  Service: Urology;  Laterality: N/A;  ONLY NEEDS 60 MIN   NASAL SINUS SURGERY  2012   PENILE SURGERY  OF MEATUS  1955   REMOVAL OF STONES  04/13/2019   Procedure: REMOVAL OF STONES;   Surgeon: Jackquline Denmark, MD;  Location: [facility];  Service: Endoscopy;;   SPHINCTEROTOMY  04/13/2019   Procedure: [name];  Surgeon: Jackquline Denmark, MD;  Location: [facility];  Service: Endoscopy;;   TRANSTHORACIC ECHOCARDIOGRAM  03-31-2011   NORMAL LVEF  59%/ TRIVIAL MR/ DIASTOLIC DYSFUNCTION/ MODERATE LVH   TRANSURETHRAL RESECTION OF PROSTATE N/A 04/20/2013   Procedure: TRANSURETHRAL RESECTION OF THE PROSTATE WITH GYRUS INSTRUMENTS;  Surgeon: Ailene Rud, MD;  Location: [facility];  Service: Urology;  Laterality: N/A;    Current Medications: Current Meds  Medication Sig   atorvastatin (LIPITOR) 40 MG tablet Take 20 mg by mouth at bedtime. Taking 20 MG Daily   carvedilol (COREG) 12.5 MG tablet TAKE ONE TABLET BY MOUTH TWICE DAILY (Patient taking differently: Take 12.5 mg by mouth 2 (two) times daily with a meal.)   ELIQUIS 5 MG TABS tablet TAKE ONE TABLET BY MOUTH TWICE DAILY (Patient taking differently: Take 5 mg by mouth 2 (two) times daily.)   ferrous sulfate 325 (65 FE) MG tablet Take 325 mg by mouth daily.  hydrochlorothiazide (HYDRODIURIL) 12.5 MG tablet TAKE ONE TABLET BY MOUTH EVERY DAY (Patient taking differently: Take 12.5 mg by mouth daily.)   nitroGLYCERIN (NITROSTAT) 0.4 MG SL tablet Place 0.4 mg under the tongue every 5 (five) minutes x 3 doses as needed for chest pain.   Omega-3 Fatty Acids (OMEGA 3 PO) Take 520 mg by mouth daily.   omeprazole (PRILOSEC) 20 MG capsule Take 20 mg by mouth daily.    potassium chloride (KLOR-CON) 10 MEQ tablet TAKE ONE TABLET BY MOUTH EVERY DAY (Patient taking differently: Take 10 mEq by mouth daily.)   Probiotic Product (PROBIOTIC DAILY PO) Take 1 tablet by mouth daily. Unknown strength   timolol (TIMOPTIC) 0.5 % ophthalmic solution Place 1 drop into both eyes 2 (two) times daily.    traZODone (DESYREL) 50 MG tablet Take 75 mg by mouth at bedtime.   triamcinolone cream (KENALOG) 0.1 % Apply 1 application topically daily as needed  (skin irritation).      Allergies:   Hydrocodone   Social History   Socioeconomic History   Marital status: Married    Spouse name: Not on file   Number of children: 2   Years of education: some college   Highest education level: Not on file  Occupational History   Occupation: retired Social research officer, government  Tobacco Use   Smoking status: Never   Smokeless tobacco: Never  Vaping Use   Vaping Use: Never used  Substance and Sexual Activity   Alcohol use: No   Drug use: No   Sexual activity: Yes  Other Topics Concern   Not on file  Social History Narrative   Lives at home with his wife.   Right-handed.   Caffeine use:  32-40 ounces caffeine per day.   Social Determinants of Health   Financial Resource Strain: Not on file  Food Insecurity: Not on file  Transportation Needs: Not on file  Physical Activity: Not on file  Stress: Not on file  Social Connections: Not on file     Family History: The patient's family history includes Breast cancer in his mother; Cirrhosis in his father; Colon cancer in his maternal aunt. There is no history of Esophageal cancer. ROS:   Please see the history of present illness.    All 14 point review of systems negative except as described per history of present illness  EKGs/Labs/Other Studies Reviewed:      Recent Labs: 10/10/2020: BUN 28; Creatinine, Ser 0.82; NT-Pro BNP 729; Potassium 4.6; Sodium 144  Recent Lipid Panel No results found for: CHOL, TRIG, HDL, CHOLHDL, VLDL, LDLCALC, LDLDIRECT  Physical Exam:    VS:  Ht 5\' 8"  (1.727 m)    Wt 187 lb 6.4 oz (85 kg)    SpO2 96%    BMI 28.49 kg/m     Wt Readings from Last 3 Encounters:  03/12/21 187 lb 6.4 oz (85 kg)  10/10/20 188 lb 12.8 oz (85.6 kg)  06/20/20 184 lb (83.5 kg)     GEN:  Well nourished, well developed in no acute distress HEENT: Normal NECK: No JVD; No carotid bruits LYMPHATICS: No lymphadenopathy CARDIAC: Irregularly irregular no murmurs, no rubs, no gallops RESPIRATORY:   Clear to auscultation without rales, wheezing or rhonchi  ABDOMEN: Soft, non-tender, non-distended MUSCULOSKELETAL:  No edema; No deformity  SKIN: Warm and dry LOWER EXTREMITIES: no swelling NEUROLOGIC:  Alert and oriented x 3 PSYCHIATRIC:  Normal affect   ASSESSMENT:    1. Coronary artery disease involving native coronary artery of  native heart without angina pectoris   2. Primary hypertension   3. Left ventricular diastolic dysfunction   4. Permanent atrial fibrillation ([place])   5. Dyslipidemia    PLAN:    In order of problems listed above:  Coronary artery disease: Stable from that point review continue present management. Essential hypertension: Blood pressure well controlled.  In contrary I think were dealing with too much medication with orthostatic hypotension.  I prefer not to discontinue diuretic because he does have some swelling of lower extremities, I asked him to make sure he drink plenty of fluids, I will cut down his Lotensin to half of the dose.  He is not sure if he is taking 20 or 40 mg I told him what ever he takes simply cut it in half. Diastolic congestive heart failure more about his symptomatology I will ask him to have echocardiogram done. Dyslipidemia: He is fasting lipid profile reviewed from [facility] October 14, 2020 with LDL 53 HDL 35 and this is on Lipitor 40 which I will continue the high intensity statin. Permanent atrial fibrillation, rate controlled, anticoagulated, continue present management   Medication Adjustments/Labs and Tests Ordered: Current medicines are reviewed at length with the patient today.  Concerns regarding medicines are outlined above.  No orders of the defined types were placed in this encounter.  Medication changes: No orders of the defined types were placed in this encounter.   Signed, Park Liter, MD, [facility] 03/12/2021 1:23 PM    [place]

## 2021-03-12 NOTE — Patient Instructions (Addendum)
Medication Instructions:  Your physician has recommended you make the following change in your medication:   START: Lotensin 20 mg daily   *If you need a refill on your cardiac medications before your next appointment, please call your pharmacy*   Lab Work: None If you have labs (blood work) drawn today and your tests are completely normal, you will receive your results only by: [place] (if you have MyChart) OR A paper copy in the mail If you have any lab test that is abnormal or we need to change your treatment, we will call you to review the results.   Testing/Procedures: Your physician has requested that you have an echocardiogram. Echocardiography is a painless test that uses sound waves to create images of your heart. It provides your doctor with information about the size and shape of your heart and how well your hearts chambers and valves are working. This procedure takes approximately one hour. There are no restrictions for this procedure.    Follow-Up: At [facility], you and your health needs are our priority.  As part of our continuing mission to provide you with exceptional heart care, we have created designated Provider Care Teams.  These Care Teams include your primary Cardiologist (physician) and Advanced Practice Providers (APPs -  Physician Assistants and Nurse Practitioners) who all work together to provide you with the care you need, when you need it.  We recommend signing up for the patient portal called "MyChart".  Sign up information is provided on this After Visit Summary.  MyChart is used to connect with patients for Virtual Visits (Telemedicine).  Patients are able to view lab/test results, encounter notes, upcoming appointments, etc.  Non-urgent messages can be sent to your provider as well.   To learn more about what you can do with MyChart, go to [email or web address].    Your next appointment:   5 month(s)  The format for your next  appointment:   In Person  Provider:   Jenne Campus, MD    Other Instructions None

## 2021-03-13 DIAGNOSIS — R2681 Unsteadiness on feet: Secondary | ICD-10-CM | POA: Diagnosis not present

## 2021-03-13 DIAGNOSIS — M6281 Muscle weakness (generalized): Secondary | ICD-10-CM | POA: Diagnosis not present

## 2021-03-13 DIAGNOSIS — R42 Dizziness and giddiness: Secondary | ICD-10-CM | POA: Diagnosis not present

## 2021-03-17 DIAGNOSIS — R42 Dizziness and giddiness: Secondary | ICD-10-CM | POA: Diagnosis not present

## 2021-03-17 DIAGNOSIS — L570 Actinic keratosis: Secondary | ICD-10-CM | POA: Diagnosis not present

## 2021-03-17 DIAGNOSIS — M6281 Muscle weakness (generalized): Secondary | ICD-10-CM | POA: Diagnosis not present

## 2021-03-17 DIAGNOSIS — R2681 Unsteadiness on feet: Secondary | ICD-10-CM | POA: Diagnosis not present

## 2021-03-17 DIAGNOSIS — L578 Other skin changes due to chronic exposure to nonionizing radiation: Secondary | ICD-10-CM | POA: Diagnosis not present

## 2021-03-17 DIAGNOSIS — L821 Other seborrheic keratosis: Secondary | ICD-10-CM | POA: Diagnosis not present

## 2021-03-18 ENCOUNTER — Encounter: Payer: Self-pay | Admitting: Cardiology

## 2021-03-18 ENCOUNTER — Other Ambulatory Visit: Payer: Self-pay

## 2021-03-18 ENCOUNTER — Ambulatory Visit (INDEPENDENT_AMBULATORY_CARE_PROVIDER_SITE_OTHER): Payer: Medicare PPO

## 2021-03-18 DIAGNOSIS — I519 Heart disease, unspecified: Secondary | ICD-10-CM

## 2021-03-18 DIAGNOSIS — E785 Hyperlipidemia, unspecified: Secondary | ICD-10-CM

## 2021-03-18 DIAGNOSIS — I2510 Atherosclerotic heart disease of native coronary artery without angina pectoris: Secondary | ICD-10-CM

## 2021-03-18 DIAGNOSIS — I4821 Permanent atrial fibrillation: Secondary | ICD-10-CM

## 2021-03-18 DIAGNOSIS — Z1212 Encounter for screening for malignant neoplasm of rectum: Secondary | ICD-10-CM | POA: Diagnosis not present

## 2021-03-18 DIAGNOSIS — I10 Essential (primary) hypertension: Secondary | ICD-10-CM | POA: Diagnosis not present

## 2021-03-18 DIAGNOSIS — R195 Other fecal abnormalities: Secondary | ICD-10-CM | POA: Diagnosis not present

## 2021-03-18 DIAGNOSIS — D649 Anemia, unspecified: Secondary | ICD-10-CM | POA: Diagnosis not present

## 2021-03-18 LAB — ECHOCARDIOGRAM COMPLETE
AR max vel: 1.39 cm2
AV Area VTI: 1.54 cm2
AV Area mean vel: 1.39 cm2
AV Mean grad: 8.5 mmHg
AV Peak grad: 14.4 mmHg
Ao pk vel: 1.9 m/s
Area-P 1/2: 3.91 cm2
MV M vel: 5.27 m/s
MV Peak grad: 111.1 mmHg
P 1/2 time: 661 msec
S' Lateral: 4 cm

## 2021-03-19 DIAGNOSIS — R2681 Unsteadiness on feet: Secondary | ICD-10-CM | POA: Diagnosis not present

## 2021-03-19 DIAGNOSIS — R42 Dizziness and giddiness: Secondary | ICD-10-CM | POA: Diagnosis not present

## 2021-03-19 DIAGNOSIS — M6281 Muscle weakness (generalized): Secondary | ICD-10-CM | POA: Diagnosis not present

## 2021-03-24 ENCOUNTER — Other Ambulatory Visit: Payer: Self-pay

## 2021-03-24 MED ORDER — POTASSIUM CHLORIDE ER 10 MEQ PO TBCR: 10.0000 meq | EXTENDED_RELEASE_TABLET | Freq: Every day | ORAL | 2 refills | Status: DC

## 2021-03-24 MED ORDER — APIXABAN 5 MG PO TABS: 5.0000 mg | ORAL_TABLET | Freq: Two times a day (BID) | ORAL | 1 refills | Status: DC

## 2021-03-24 MED ORDER — HYDROCHLOROTHIAZIDE 12.5 MG PO TABS: 12.5000 mg | ORAL_TABLET | Freq: Every day | ORAL | 2 refills | Status: DC

## 2021-03-26 ENCOUNTER — Other Ambulatory Visit: Payer: Self-pay

## 2021-03-26 MED ORDER — BENAZEPRIL HCL 20 MG PO TABS: 20.0000 mg | ORAL_TABLET | Freq: Every day | ORAL | 3 refills | Status: DC

## 2021-03-26 MED ORDER — FUROSEMIDE 20 MG PO TABS: 20.0000 mg | ORAL_TABLET | Freq: Every day | ORAL | 3 refills | Status: DC

## 2021-03-28 DIAGNOSIS — M6281 Muscle weakness (generalized): Secondary | ICD-10-CM | POA: Diagnosis not present

## 2021-03-28 DIAGNOSIS — R2681 Unsteadiness on feet: Secondary | ICD-10-CM | POA: Diagnosis not present

## 2021-03-28 DIAGNOSIS — R42 Dizziness and giddiness: Secondary | ICD-10-CM | POA: Diagnosis not present

## 2021-03-31 ENCOUNTER — Telehealth: Payer: Self-pay | Admitting: Cardiology

## 2021-03-31 NOTE — Telephone Encounter (Signed)
FYI--Patient would like to inform Dr. Agustin Cree that he is no longer able to give blood due to being on Eliquis.

## 2021-04-01 DIAGNOSIS — R2681 Unsteadiness on feet: Secondary | ICD-10-CM | POA: Diagnosis not present

## 2021-04-01 DIAGNOSIS — R42 Dizziness and giddiness: Secondary | ICD-10-CM | POA: Diagnosis not present

## 2021-04-01 DIAGNOSIS — M6281 Muscle weakness (generalized): Secondary | ICD-10-CM | POA: Diagnosis not present

## 2021-04-02 DIAGNOSIS — I2510 Atherosclerotic heart disease of native coronary artery without angina pectoris: Secondary | ICD-10-CM | POA: Diagnosis not present

## 2021-04-02 DIAGNOSIS — I482 Chronic atrial fibrillation, unspecified: Secondary | ICD-10-CM | POA: Diagnosis not present

## 2021-04-02 DIAGNOSIS — R7303 Prediabetes: Secondary | ICD-10-CM | POA: Diagnosis not present

## 2021-04-02 DIAGNOSIS — I10 Essential (primary) hypertension: Secondary | ICD-10-CM | POA: Diagnosis not present

## 2021-04-03 DIAGNOSIS — M6281 Muscle weakness (generalized): Secondary | ICD-10-CM | POA: Diagnosis not present

## 2021-04-03 DIAGNOSIS — R2681 Unsteadiness on feet: Secondary | ICD-10-CM | POA: Diagnosis not present

## 2021-04-03 DIAGNOSIS — R42 Dizziness and giddiness: Secondary | ICD-10-CM | POA: Diagnosis not present

## 2021-04-04 ENCOUNTER — Encounter: Payer: Self-pay | Admitting: Sports Medicine

## 2021-04-04 ENCOUNTER — Ambulatory Visit (INDEPENDENT_AMBULATORY_CARE_PROVIDER_SITE_OTHER): Payer: Medicare PPO | Admitting: Sports Medicine

## 2021-04-04 ENCOUNTER — Other Ambulatory Visit: Payer: Self-pay

## 2021-04-04 DIAGNOSIS — M79675 Pain in left toe(s): Secondary | ICD-10-CM

## 2021-04-04 DIAGNOSIS — B351 Tinea unguium: Secondary | ICD-10-CM | POA: Diagnosis not present

## 2021-04-04 DIAGNOSIS — I739 Peripheral vascular disease, unspecified: Secondary | ICD-10-CM

## 2021-04-04 DIAGNOSIS — E119 Type 2 diabetes mellitus without complications: Secondary | ICD-10-CM

## 2021-04-04 DIAGNOSIS — M79674 Pain in right toe(s): Secondary | ICD-10-CM

## 2021-04-04 NOTE — Progress Notes (Signed)
Subjective: ?Spencer Cochran is a 76 y.o. male patient seen today in office with complaint of mildly painful thickened and elongated toenails; unable to trim.  Patient reports that his last blood sugar was 138, last A1c 5.8 and blood pressure 92/57 this morning.  No other pedal complaints noted.. ? ?Patient Active Problem List  ? Diagnosis Date Noted  ? Low back pain, unspecified 03/11/2021  ? Impacted cerumen, left ear 10/09/2020  ? Other specified counseling 10/09/2020  ? Tinnitus, bilateral 10/09/2020  ? Benign neoplasm of colon 06/13/2020  ? Obesity 06/13/2020  ? Kidney stone 06/13/2020  ? Endocarditis 06/13/2020  ? Encounter for fitting and adjustment of hearing aid 06/13/2020  ? Blood in urine 06/13/2020  ? Sensorineural hearing loss, bilateral 06/13/2020  ? Coronary artery disease   ? COVID-19 virus infection 02/2020  ? Unsteadiness   ? Pre-diabetes   ? OSA on CPAP   ? Nocturia   ? Left ventricular diastolic dysfunction   ? Impaired hearing   ? Hypertension   ? History of kidney stones   ? History of colon polyps   ? Frequency of urination   ? Dysrhythmia   ? GERD (gastroesophageal reflux disease)   ? DDD (degenerative disc disease), lumbar   ? Cancer [facility])   ? Asymptomatic gallstones   ? Arthritis   ? Palpitations 08/10/2019  ? Obstructive sleep apnea 06/05/2019  ? Obstructive jaundice   ? Choledocholithiasis   ? Biliary obstruction   ? Gait abnormality 08/03/2018  ? Paresthesia 07/18/2018  ? Dyslipidemia 10/01/2016  ? Coronary artery disease involving native coronary artery of native heart without angina pectoris 10/09/2014  ? Atrial fibrillation ([place]) 03/08/2014  ? Benign prostatic hypertrophy with incomplete bladder emptying 04/20/2013  ? ? ?Current Outpatient Medications on File Prior to Visit  ?Medication Sig Dispense Refill  ? apixaban (ELIQUIS) 5 MG TABS tablet Take 1 tablet (5 mg total) by mouth 2 (two) times daily. 180 tablet 1  ? atorvastatin (LIPITOR) 40 MG tablet Take 20 mg by mouth at bedtime.  Taking 20 MG Daily    ? benazepril (LOTENSIN) 20 MG tablet Take 1 tablet (20 mg total) by mouth daily. 90 tablet 3  ? carvedilol (COREG) 12.5 MG tablet TAKE ONE TABLET BY MOUTH TWICE DAILY (Patient taking differently: Take 12.5 mg by mouth 2 (two) times daily with a meal.) 60 tablet 2  ? ferrous sulfate 325 (65 FE) MG tablet Take 325 mg by mouth daily.    ? furosemide (LASIX) 20 MG tablet Take 1 tablet (20 mg total) by mouth daily. 90 tablet 3  ? hydrochlorothiazide (HYDRODIURIL) 12.5 MG tablet Take 1 tablet (12.5 mg total) by mouth daily. 90 tablet 2  ? nitroGLYCERIN (NITROSTAT) 0.4 MG SL tablet Place 0.4 mg under the tongue every 5 (five) minutes x 3 doses as needed for chest pain.    ? Omega-3 Fatty Acids (OMEGA 3 PO) Take 520 mg by mouth daily.    ? omeprazole (PRILOSEC) 20 MG capsule Take 20 mg by mouth daily.     ? potassium chloride (KLOR-CON) 10 MEQ tablet Take 1 tablet (10 mEq total) by mouth daily. 90 tablet 2  ? Probiotic Product (PROBIOTIC DAILY PO) Take 1 tablet by mouth daily. Unknown strength    ? timolol (TIMOPTIC) 0.5 % ophthalmic solution Place 1 drop into both eyes 2 (two) times daily.     ? traZODone (DESYREL) 50 MG tablet Take 75 mg by mouth at bedtime.    ? triamcinolone  cream (KENALOG) 0.1 % Apply 1 application topically daily as needed (skin irritation).     ? ?No current facility-administered medications on file prior to visit.  ? ? ?Allergies  ?Allergen Reactions  ? Hydrocodone Other (See Comments)  ?  insomnia "awake for 48 hours"  ? ? ?Objective: ?Physical Exam ? ?General: Well developed, nourished, no acute distress, awake, alert and oriented x 3 ? ?Vascular: Dorsalis pedis artery 1/4 bilateral, Posterior tibial artery 0/4 bilateral due to trace edema at ankles bilateral. ? ?Neurological: Gross sensation present via light touch bilateral.  ? ?Dermatological: Skin is warm, dry, and supple bilateral, Nails 1-10 are tender, long, thick, and discolored with mild subungal debris, there is  minimal keratotic lesion/corn noted to the right fourth toe.  No open lesions present bilateral.  No signs of infection bilateral. ? ?Musculoskeletal: Fourth hammertoe bilateral and pes planus deformities noted bilateral. Muscular strength within normal limits without painon range of motion. No pain with calf compression bilateral. ? ?Assessment and Plan:  ?Problem List Items Addressed This Visit   ?None ?Visit Diagnoses   ? ? Pain due to onychomycosis of toenails of both feet    -  Primary  ? Diabetes mellitus without complication ([place])      ? PVD (peripheral vascular disease) ([place])      ? ?  ? ?-Examined patient.  ?-Re-Discussed treatment options for painful mycotic nails. ?-Mechanically debrided and reduced all painful mycotic nails with sterile nail nipper and dremel nail file without incident ?-Patient to return as needed nail care in 2.5 months or sooner if symptoms worsen. ? Landis Martins, DPM ? ?

## 2021-04-05 ENCOUNTER — Other Ambulatory Visit: Payer: Self-pay | Admitting: Cardiology

## 2021-04-07 DIAGNOSIS — H52223 Regular astigmatism, bilateral: Secondary | ICD-10-CM | POA: Diagnosis not present

## 2021-04-07 DIAGNOSIS — H401132 Primary open-angle glaucoma, bilateral, moderate stage: Secondary | ICD-10-CM | POA: Diagnosis not present

## 2021-04-07 DIAGNOSIS — Z9849 Cataract extraction status, unspecified eye: Secondary | ICD-10-CM | POA: Diagnosis not present

## 2021-04-07 DIAGNOSIS — I10 Essential (primary) hypertension: Secondary | ICD-10-CM | POA: Diagnosis not present

## 2021-04-07 DIAGNOSIS — H5213 Myopia, bilateral: Secondary | ICD-10-CM | POA: Diagnosis not present

## 2021-04-07 DIAGNOSIS — E119 Type 2 diabetes mellitus without complications: Secondary | ICD-10-CM | POA: Diagnosis not present

## 2021-04-07 DIAGNOSIS — H47233 Glaucomatous optic atrophy, bilateral: Secondary | ICD-10-CM | POA: Diagnosis not present

## 2021-04-07 DIAGNOSIS — H353121 Nonexudative age-related macular degeneration, left eye, early dry stage: Secondary | ICD-10-CM | POA: Diagnosis not present

## 2021-04-07 DIAGNOSIS — Z961 Presence of intraocular lens: Secondary | ICD-10-CM | POA: Diagnosis not present

## 2021-04-08 ENCOUNTER — Other Ambulatory Visit: Payer: Self-pay | Admitting: Cardiology

## 2021-04-08 DIAGNOSIS — M6281 Muscle weakness (generalized): Secondary | ICD-10-CM | POA: Diagnosis not present

## 2021-04-08 DIAGNOSIS — R2681 Unsteadiness on feet: Secondary | ICD-10-CM | POA: Diagnosis not present

## 2021-04-08 DIAGNOSIS — R42 Dizziness and giddiness: Secondary | ICD-10-CM | POA: Diagnosis not present

## 2021-04-08 MED ORDER — POTASSIUM CHLORIDE ER 10 MEQ PO TBCR: 10.0000 meq | EXTENDED_RELEASE_TABLET | Freq: Every day | ORAL | 2 refills | Status: DC

## 2021-04-08 MED ORDER — BENAZEPRIL HCL 20 MG PO TABS: 20.0000 mg | ORAL_TABLET | Freq: Every day | ORAL | 2 refills | Status: DC

## 2021-04-08 MED ORDER — CARVEDILOL 12.5 MG PO TABS: 12.5000 mg | ORAL_TABLET | Freq: Two times a day (BID) | ORAL | 2 refills | Status: DC

## 2021-04-08 MED ORDER — HYDROCHLOROTHIAZIDE 12.5 MG PO TABS: 12.5000 mg | ORAL_TABLET | Freq: Every day | ORAL | 2 refills | Status: DC

## 2021-04-08 MED ORDER — NITROGLYCERIN 0.4 MG SL SUBL: 0.4000 mg | SUBLINGUAL_TABLET | SUBLINGUAL | 10 refills | Status: DC | PRN

## 2021-04-08 MED ORDER — FUROSEMIDE 20 MG PO TABS: 20.0000 mg | ORAL_TABLET | Freq: Every day | ORAL | 2 refills | Status: DC

## 2021-04-08 NOTE — Telephone Encounter (Signed)
Refills of Lotensin 20 mg, Carvedilol 12.5 mg, Lasix 20 mg, HCTZ 12.5 mg, Nitroglycerin 0.4 mg, Potassium Chloride 10 mEq sent to [facility]. ?

## 2021-04-08 NOTE — Telephone Encounter (Signed)
Patient called and said his insurance changed and all of his medications now need to go to [facility] ? ? ?*STAT* If patient is at the pharmacy, call can be transferred to refill team. ? ? ?1. Which medications need to be refilled? (please list name of each medication and dose if known)  ?apixaban (ELIQUIS) 5 MG TABS tablet ?benazepril (LOTENSIN) 20 MG tablet ?carvedilol (COREG) 12.5 MG tablet ?furosemide (LASIX) 20 MG tablet ?hydrochlorothiazide (HYDRODIURIL) 12.5 MG tablet ?nitroGLYCERIN (NITROSTAT) 0.4 MG SL tablet ?potassium chloride (KLOR-CON) 10 MEQ tablet ? ?2. Which pharmacy/location (including street and city if local pharmacy) is medication to be sent to? ?[place], [place] ? ?3. Do they need a 30 day or 90 day supply? 90 with refills  ?

## 2021-04-09 MED ORDER — APIXABAN 5 MG PO TABS: 5.0000 mg | ORAL_TABLET | Freq: Two times a day (BID) | ORAL | 1 refills | Status: DC

## 2021-04-09 NOTE — Telephone Encounter (Signed)
Prescription refill request for Eliquis received. ?Indication:Afib ?Last office visit:2/23 ?Scr:0.8 ?Age: 76 ?Weight:85 kg ? ?Prescription refilled ? ?

## 2021-04-10 DIAGNOSIS — R2681 Unsteadiness on feet: Secondary | ICD-10-CM | POA: Diagnosis not present

## 2021-04-10 DIAGNOSIS — R42 Dizziness and giddiness: Secondary | ICD-10-CM | POA: Diagnosis not present

## 2021-04-10 DIAGNOSIS — M6281 Muscle weakness (generalized): Secondary | ICD-10-CM | POA: Diagnosis not present

## 2021-04-21 ENCOUNTER — Encounter: Payer: Self-pay | Admitting: Cardiology

## 2021-05-03 DIAGNOSIS — E782 Mixed hyperlipidemia: Secondary | ICD-10-CM | POA: Diagnosis not present

## 2021-05-03 DIAGNOSIS — I10 Essential (primary) hypertension: Secondary | ICD-10-CM | POA: Diagnosis not present

## 2021-05-07 DIAGNOSIS — I10 Essential (primary) hypertension: Secondary | ICD-10-CM | POA: Diagnosis not present

## 2021-05-07 DIAGNOSIS — R7303 Prediabetes: Secondary | ICD-10-CM | POA: Diagnosis not present

## 2021-05-07 DIAGNOSIS — E782 Mixed hyperlipidemia: Secondary | ICD-10-CM | POA: Diagnosis not present

## 2021-05-08 DIAGNOSIS — I110 Hypertensive heart disease with heart failure: Secondary | ICD-10-CM | POA: Diagnosis not present

## 2021-05-08 DIAGNOSIS — I509 Heart failure, unspecified: Secondary | ICD-10-CM | POA: Diagnosis not present

## 2021-05-08 DIAGNOSIS — D6869 Other thrombophilia: Secondary | ICD-10-CM | POA: Diagnosis not present

## 2021-05-08 DIAGNOSIS — D649 Anemia, unspecified: Secondary | ICD-10-CM | POA: Diagnosis not present

## 2021-05-08 DIAGNOSIS — E261 Secondary hyperaldosteronism: Secondary | ICD-10-CM | POA: Diagnosis not present

## 2021-05-08 DIAGNOSIS — E785 Hyperlipidemia, unspecified: Secondary | ICD-10-CM | POA: Diagnosis not present

## 2021-05-08 DIAGNOSIS — G4700 Insomnia, unspecified: Secondary | ICD-10-CM | POA: Diagnosis not present

## 2021-05-08 DIAGNOSIS — I4891 Unspecified atrial fibrillation: Secondary | ICD-10-CM | POA: Diagnosis not present

## 2021-05-08 DIAGNOSIS — E669 Obesity, unspecified: Secondary | ICD-10-CM | POA: Diagnosis not present

## 2021-05-12 DIAGNOSIS — M19012 Primary osteoarthritis, left shoulder: Secondary | ICD-10-CM | POA: Diagnosis not present

## 2021-05-14 DIAGNOSIS — I482 Chronic atrial fibrillation, unspecified: Secondary | ICD-10-CM | POA: Diagnosis not present

## 2021-05-14 DIAGNOSIS — R7303 Prediabetes: Secondary | ICD-10-CM | POA: Diagnosis not present

## 2021-05-14 DIAGNOSIS — I10 Essential (primary) hypertension: Secondary | ICD-10-CM | POA: Diagnosis not present

## 2021-05-14 DIAGNOSIS — E782 Mixed hyperlipidemia: Secondary | ICD-10-CM | POA: Diagnosis not present

## 2021-05-24 ENCOUNTER — Other Ambulatory Visit: Payer: Self-pay | Admitting: Cardiology

## 2021-06-05 IMAGING — MR MR ABDOMEN WO/W CM
36 of 46 series · 38 of 48 positions shown · IV contrast (8ML GADAVIST)
Comparison: CT abdomen/pelvis dated 01/18/2019

CLINICAL DATA: Obstructive jaundice, choledocholithiasis

EXAM:
MRI ABDOMEN WITHOUT AND WITH CONTRAST
TECHNIQUE: Multiplanar multisequence MR imaging of the abdomen was performed
both before and after the administration of intravenous contrast.
CONTRAST:  8mL GADAVIST GADOBUTROL 1 MMOL/ML IV SOLN

[Series 4: T2 fat-sat · axial · 6.0mm · 1.31mm/px · 1 of 40 slices shown (1 of 4)]
[im 1/40]
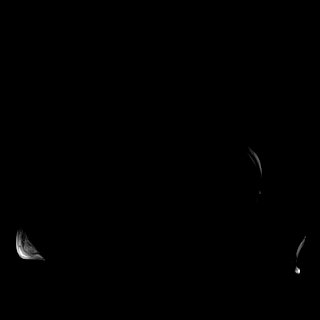

[Series 4: T2 fat-sat · axial · 6.0mm · 1.31mm/px · 1 of 40 slices shown (2 of 4)]
[im 1/40]
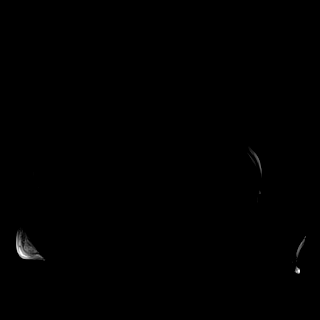

[Series 6: T1 · axial · 3.8mm · 1.31mm/px · 1 of 80 slices shown (1 of 2)]
[im 1/80]
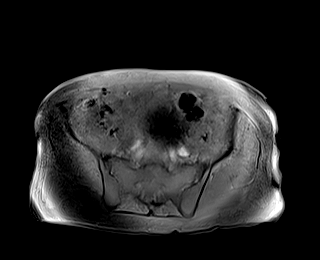

[Series 6: T1 · axial · 3.8mm · 1.31mm/px · 1 of 80 slices shown (2 of 2)]
[im 1/80]
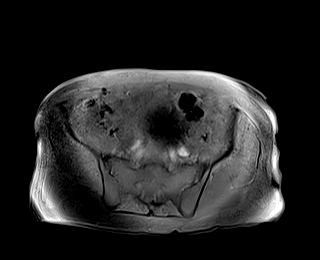

[Series 7: bSSFP · coronal · 6.0mm · 0.82mm/px · 1 of 50 slices shown (1 of 4)]
[im 1/50]
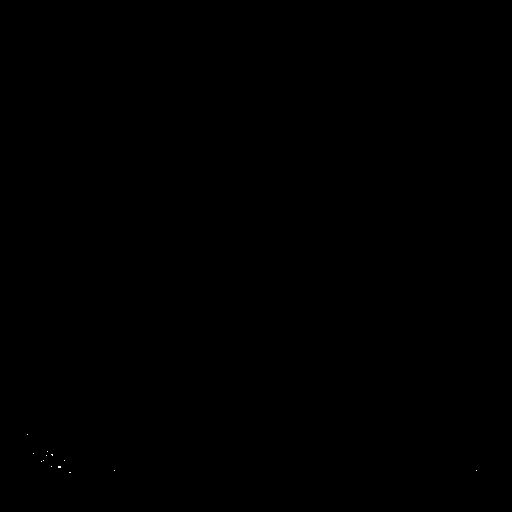

[Series 7: bSSFP · coronal · 6.0mm · 0.82mm/px · 1 of 50 slices shown (2 of 4)]
[im 1/50]
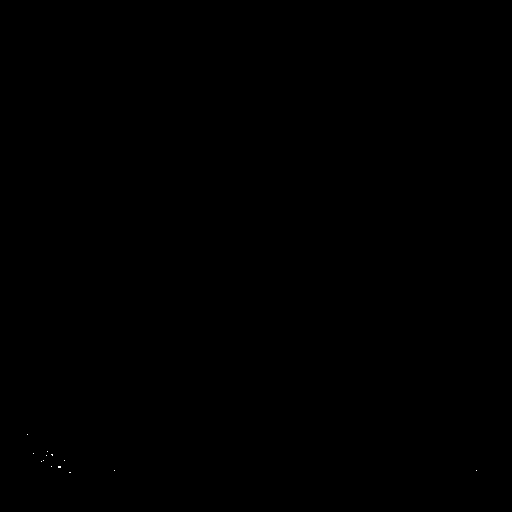

[Series 10: DWI · axial · 6.0mm · 1.57mm/px · z∈[-137,+158]mm · 2 of 126 slices shown (1 of 4)]
[im 1/126]
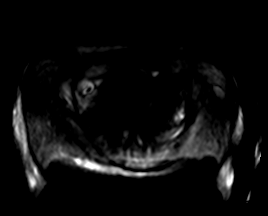
[im 126/126]
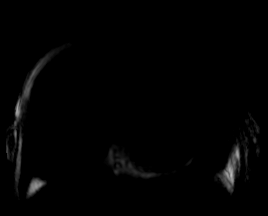

[Series 10: DWI · axial · 6.0mm · 1.57mm/px · z∈[-137,+158]mm · 2 of 126 slices shown (2 of 4)]
[im 1/126]
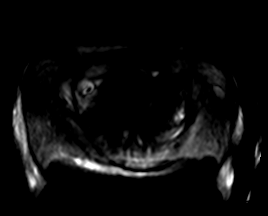
[im 126/126]
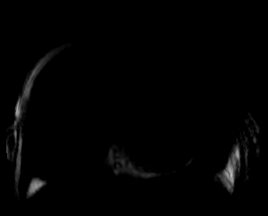

[Series 11: DWI · axial · 6.0mm · 1.57mm/px · 1 of 42 slices shown (3 of 4)]
[im 1/42]
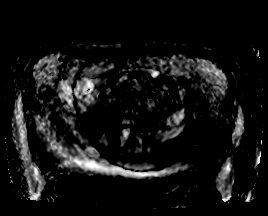

[Series 11: DWI · axial · 6.0mm · 1.57mm/px · 1 of 42 slices shown (4 of 4)]
[im 1/42]
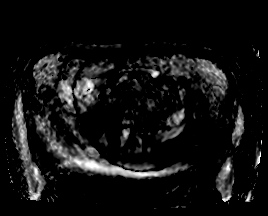

[Series 12: cor obl thk · oblique · 50.0mm · 0.78mm/px · 1 of 9 slices shown (1 of 4)]
[im 1/9]
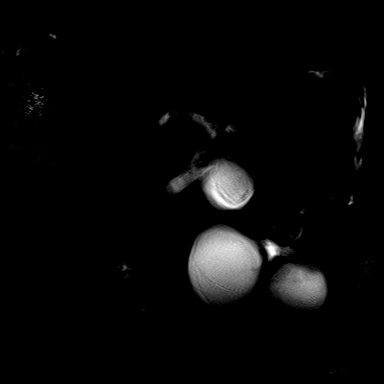

[Series 12: cor obl thk · oblique · 50.0mm · 0.78mm/px · 1 of 9 slices shown (2 of 4)]
[im 1/9]
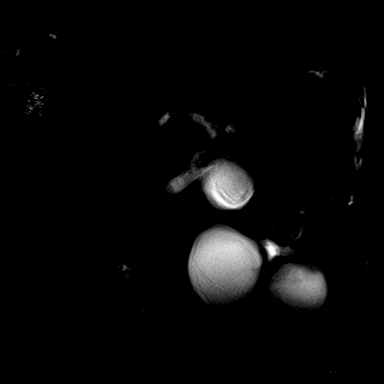

[Series 14: cor obl thk · oblique · 50.0mm · 0.78mm/px · 1 of 9 slices shown (3 of 4)]
[im 1/9]
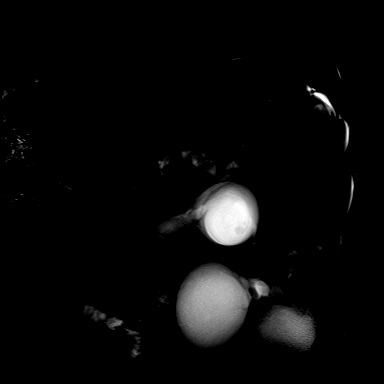

[Series 14: cor obl thk · oblique · 50.0mm · 0.78mm/px · 1 of 9 slices shown (4 of 4)]
[im 1/9]
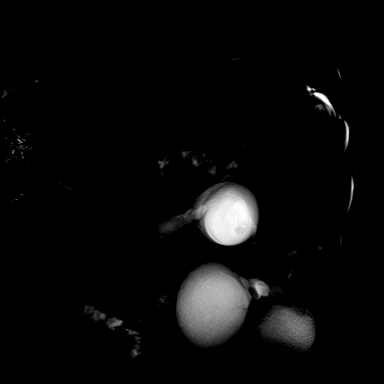

[Series 15: cor_3d_spc_trig · coronal · 1.1mm · 0.53mm/px · 1 of 80 slices shown (1 of 2)]
[im 1/80]
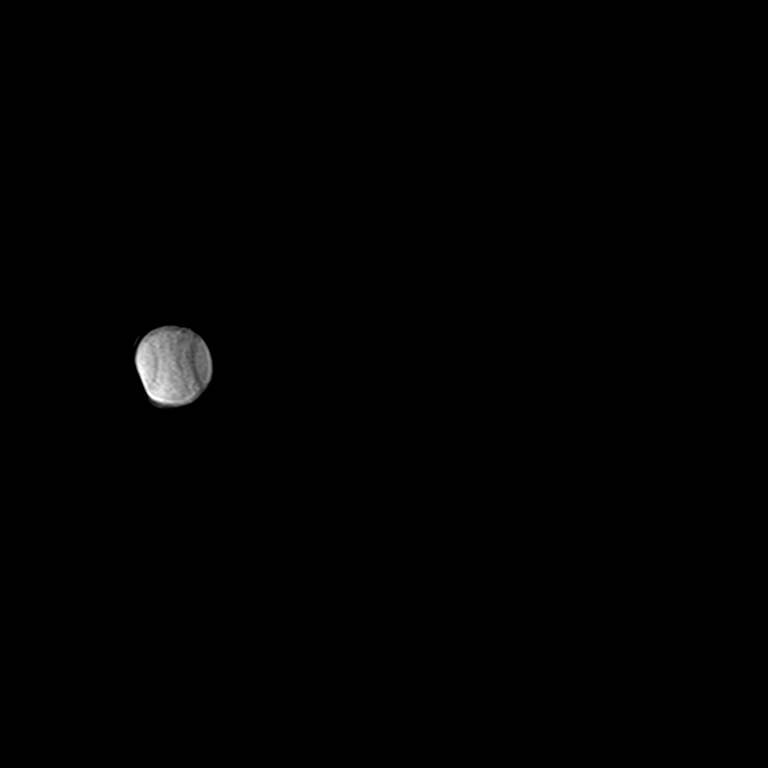

[Series 15: cor_3d_spc_trig · coronal · 1.1mm · 0.53mm/px · 1 of 80 slices shown (2 of 2)]
[im 1/80]
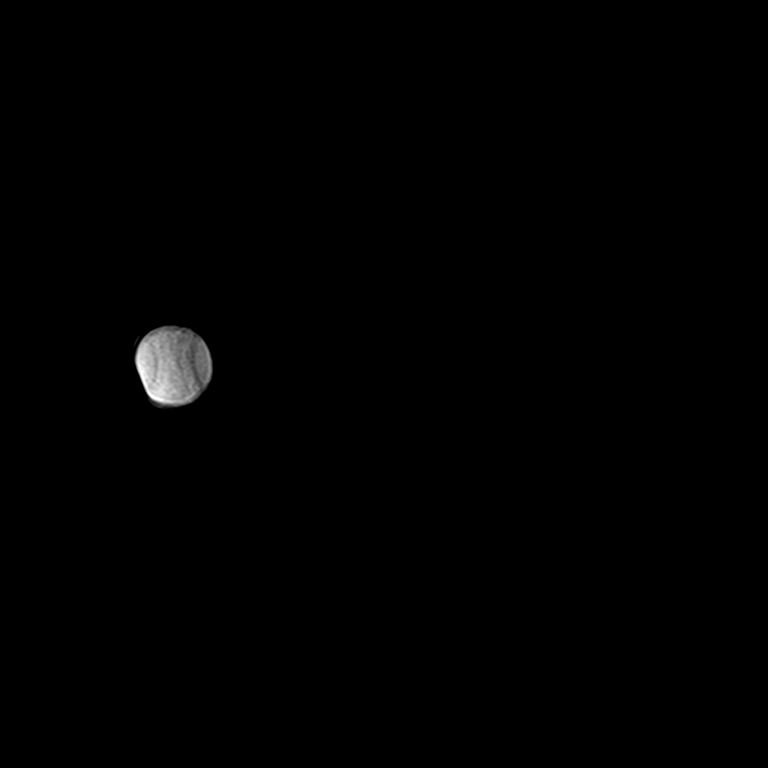

[Series 18: T2 fat-sat · axial · 6.0mm · 1.31mm/px · 1 of 40 slices shown (3 of 4)]
[im 1/40]
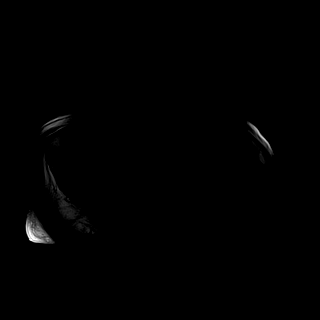

[Series 18: T2 fat-sat · axial · 6.0mm · 1.31mm/px · 1 of 40 slices shown (4 of 4)]
[im 1/40]
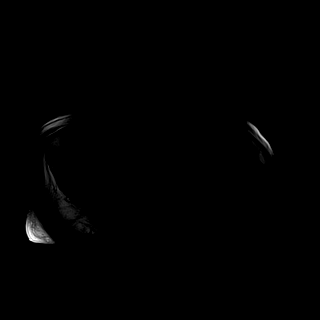

[Series 19: bSSFP · coronal · 6.0mm · 1.47mm/px · 1 of 35 slices shown (3 of 4)]
[im 1/35]
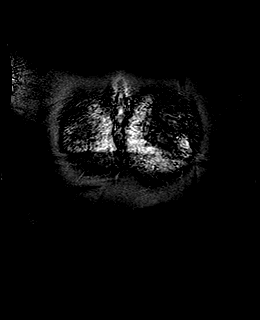

[Series 19: bSSFP · coronal · 6.0mm · 1.47mm/px · 1 of 35 slices shown (4 of 4)]
[im 1/35]
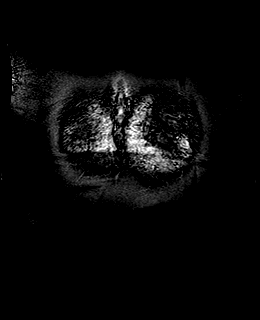

[Series 20: T2 · axial · 6.6mm · 1.68mm/px · 1 of 46 slices shown (1 of 2)]
[im 1/46]
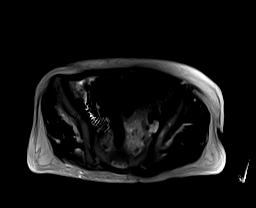

[Series 20: T2 · axial · 6.6mm · 1.68mm/px · 1 of 46 slices shown (2 of 2)]
[im 1/46]
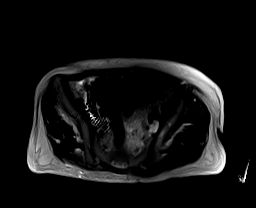

[Series 22: T1 dynamic · axial · 3.6mm · 1.38mm/px · 1 of 88 slices shown (1 of 14)]
[im 1/88]
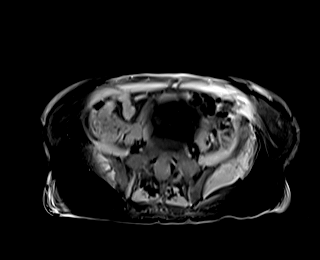

[Series 22: T1 dynamic · axial · 3.6mm · 1.38mm/px · 1 of 88 slices shown (2 of 14)]
[im 1/88]
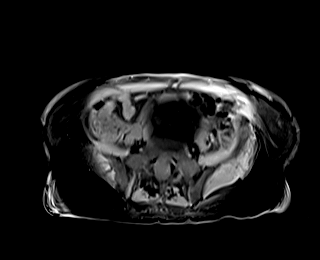

[Series 26: T1 dynamic · axial · 3.6mm · 1.38mm/px · 1 of 88 slices shown (3 of 14)]
[im 1/88]
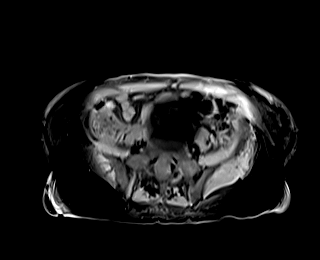

[Series 26: T1 dynamic · axial · 3.6mm · 1.38mm/px · 1 of 88 slices shown (4 of 14)]
[im 1/88]
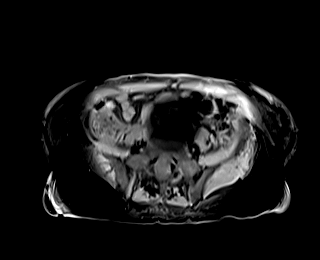

[Series 27: T1 dynamic · axial · 3.6mm · 1.38mm/px · 1 of 88 slices shown (5 of 14)]
[im 1/88]
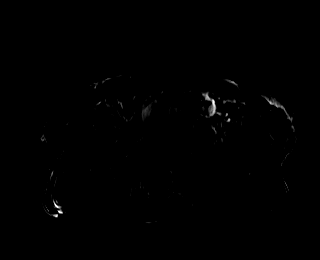

[Series 27: T1 dynamic · axial · 3.6mm · 1.38mm/px · 1 of 88 slices shown (6 of 14)]
[im 1/88]
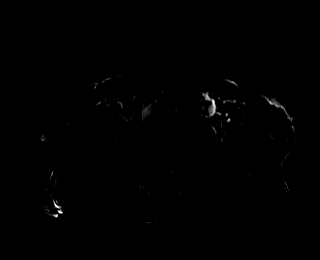

[Series 30: T1 dynamic · axial · 3.6mm · 1.38mm/px · 1 of 88 slices shown (7 of 14)]
[im 1/88]
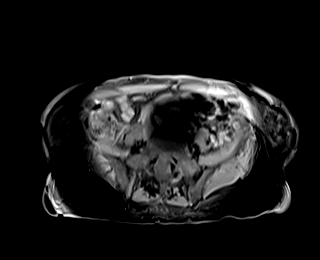

[Series 30: T1 dynamic · axial · 3.6mm · 1.38mm/px · 1 of 88 slices shown (8 of 14)]
[im 1/88]
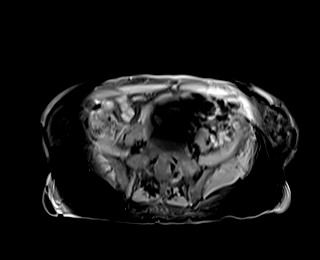

[Series 31: T1 dynamic · axial · 3.6mm · 1.38mm/px · 1 of 88 slices shown (9 of 14)]
[im 1/88]
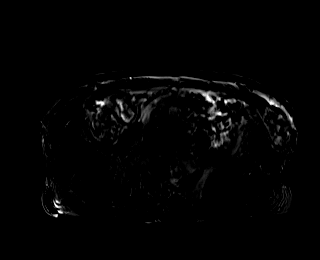

[Series 31: T1 dynamic · axial · 3.6mm · 1.38mm/px · 1 of 88 slices shown (10 of 14)]
[im 1/88]
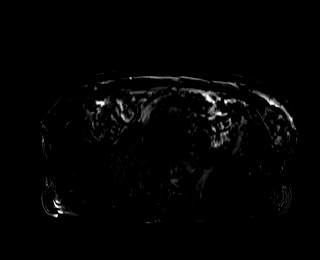

[Series 33: T1 dynamic · axial · 3.6mm · 1.38mm/px · 1 of 88 slices shown (11 of 14)]
[im 1/88]
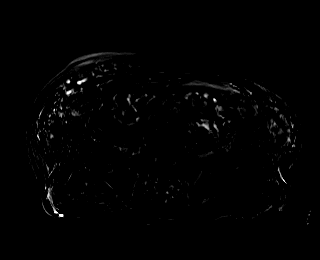

[Series 33: T1 dynamic · axial · 3.6mm · 1.38mm/px · 1 of 88 slices shown (12 of 14)]
[im 1/88]
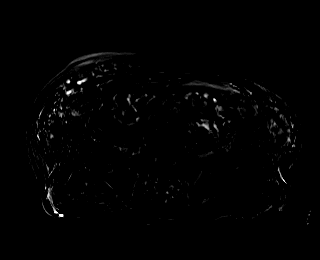

[Series 34: T1 dynamic · axial · 3.6mm · 1.38mm/px · 1 of 88 slices shown (13 of 14)]
[im 1/88]
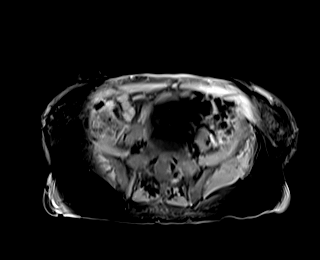

[Series 34: T1 dynamic · axial · 3.6mm · 1.38mm/px · 1 of 88 slices shown (14 of 14)]
[im 1/88]
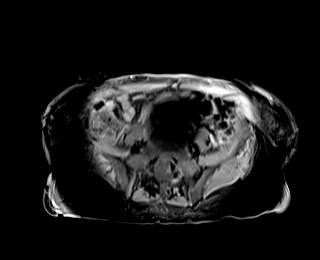

[38 of 48 positions shown; findings below may reference images not displayed]

FINDINGS: Motion degraded images.

Lower chest: Lung bases are clear.

Hepatobiliary: Dominant 13 mm cyst in the right hepatic dome (series
20/image 10). No suspicious hepatic lesions.

Mild central intrahepatic ductal dilatation. Dilated common duct,
measuring 12 mm (series 19/image 22). Associated 12 mm distal CBD
stone at the ampulla (series 19/image 23).

Layering subcentimeter gallstone (series 20/image 25). Gallbladder
distension. No gallbladder wall thickening or pericholecystic
fluid/inflammatory changes.

Pancreas:  Within normal limits.

Spleen:  Within normal limits.

Adrenals/Urinary Tract:  Adrenal glands are within normal limits.

Bilateral renal cysts, measuring up to 8.3 cm on the left (series
20/image 27). No enhancing renal lesions. No hydronephrosis.

Stomach/Bowel: Stomach and visualized bowel are unremarkable.

Vascular/Lymphatic:  No evidence of abdominal aortic aneurysm.

No suspicious abdominopelvic lymphadenopathy.

Other:  No abdominal ascites.

Musculoskeletal: Mild degenerative changes of the visualized
thoracolumbar spine.
IMPRESSION: Motion degraded images.

Distended bladder with cholelithiasis. No evidence of acute
cholecystitis.

Mild intrahepatic and extrahepatic ductal dilatation. Dilated common
duct, measuring 12 mm. Associated choledocholithiasis is 12 mm
distal CBD stone at the ampulla. ERCP is suggested.

These results will be called to the ordering clinician or
representative by the Radiologist Assistant, and communication
documented in the PACS or zVision Dashboard.

## 2021-06-07 IMAGING — RF DG ERCP WO/W SPHINCTEROTOMY
1 series · 15 of 15 positions shown · non-contrast
Comparison: MRI/MRCP on 04/11/2019

CLINICAL DATA: Biliary obstruction and choledocholithiasis.

EXAM:
ERCP
TECHNIQUE: Multiple spot images obtained with the fluoroscopic device and
submitted for interpretation post-procedure.

[Series 1: run · 12 acquisitions, 15 frames shown]
[im 1/12]
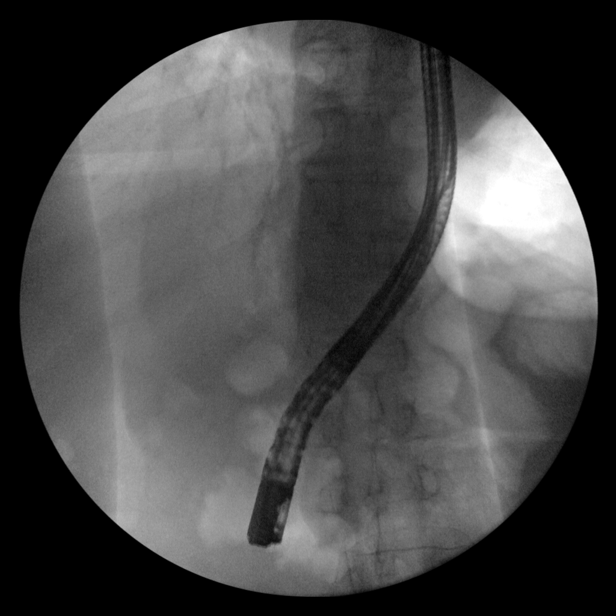
[im 2/12]
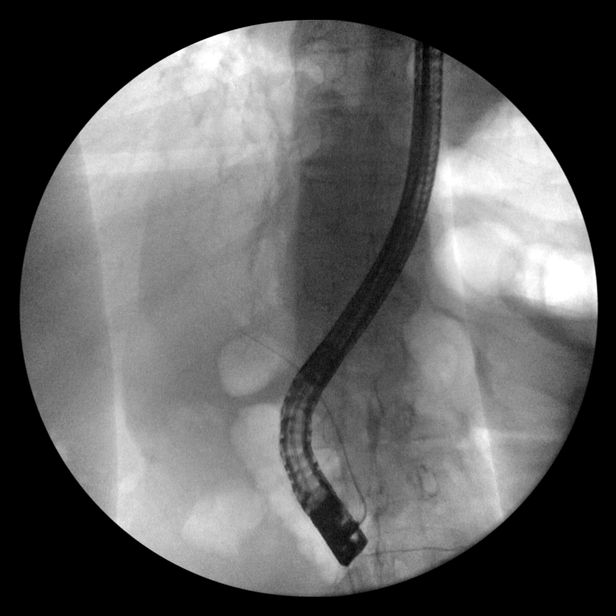
[im 3/12]
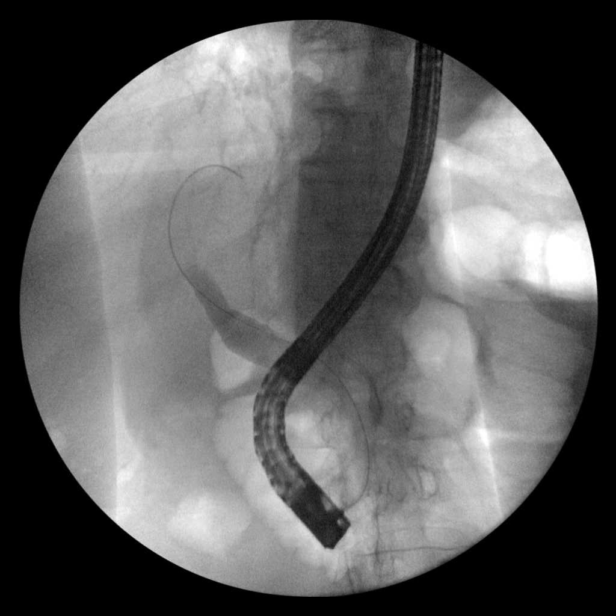
[im 4/12]
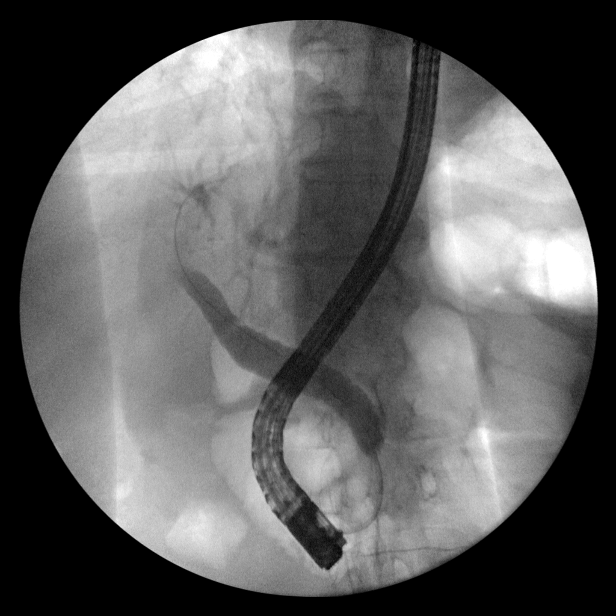
[im 5/12]
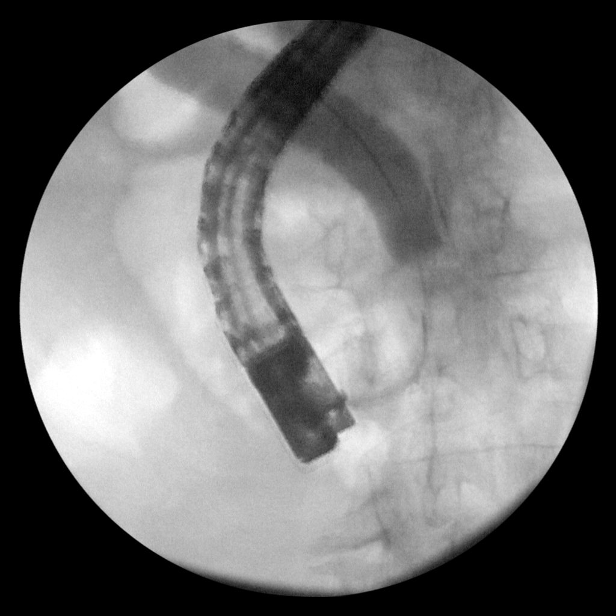
[im 6/12]
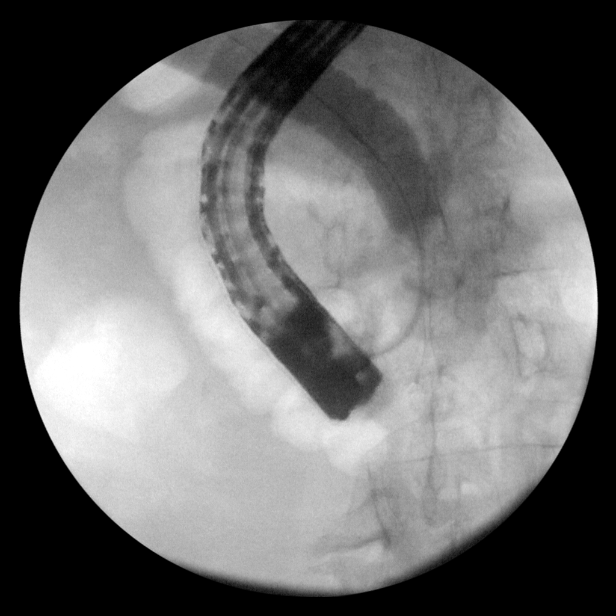
[im 7/12]
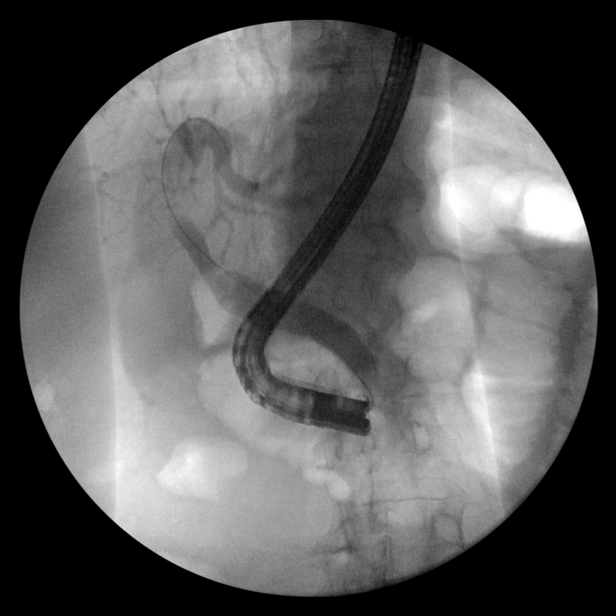
[im 8/12]
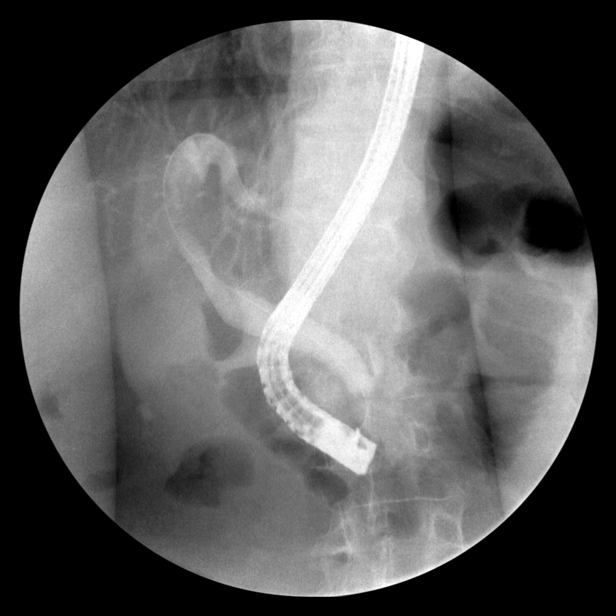
[im 9/12]
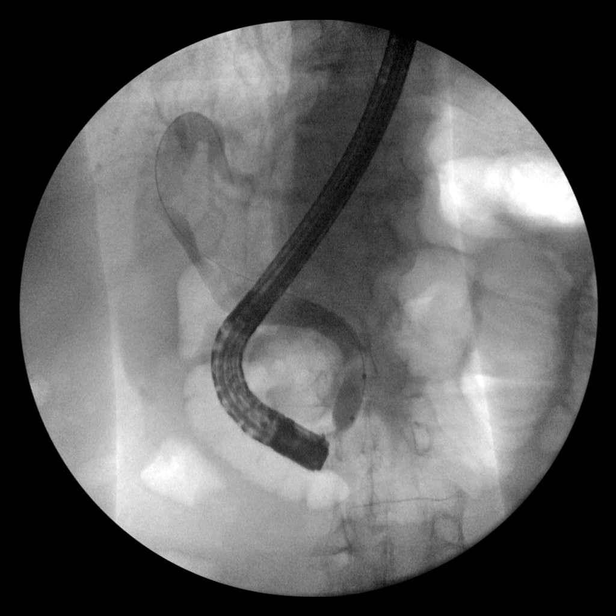
[im 10/12]
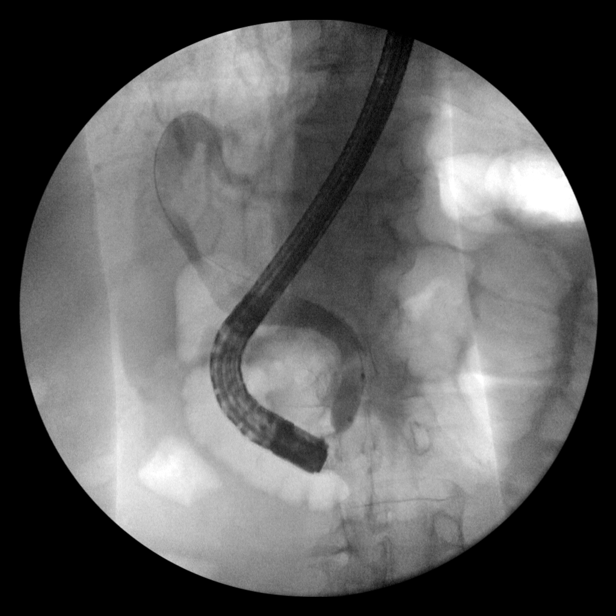
[im 11/12]
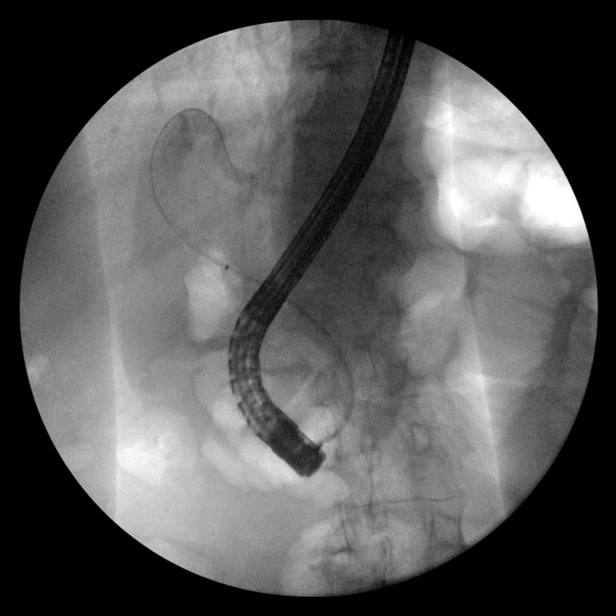
[im 12/12]
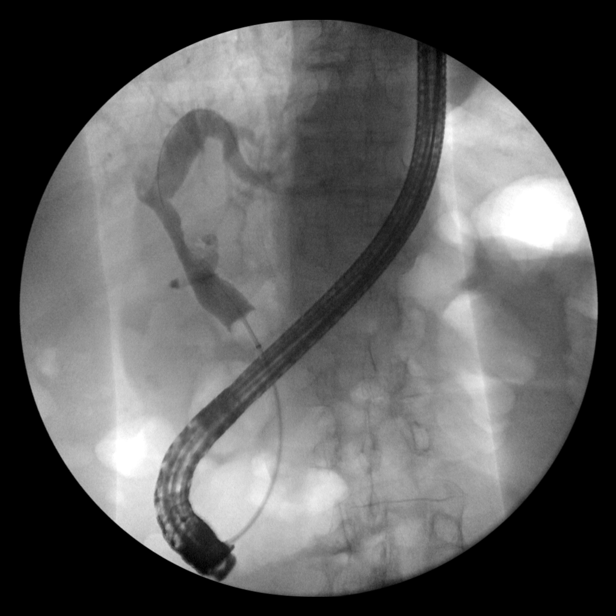
[im 12/12]
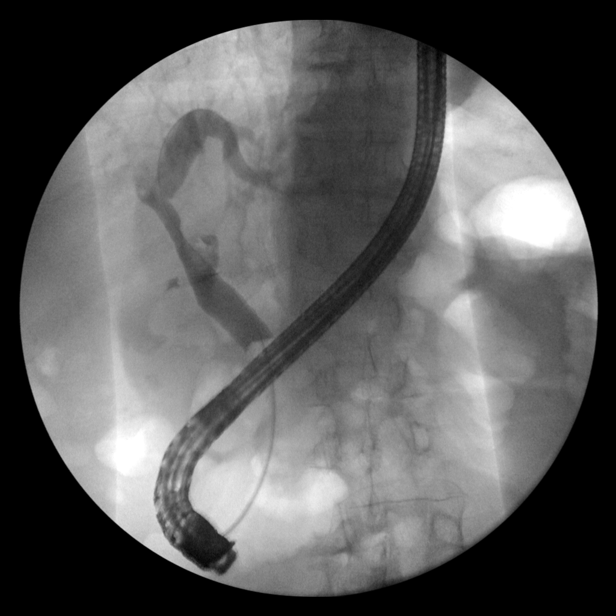
[im 12/12]
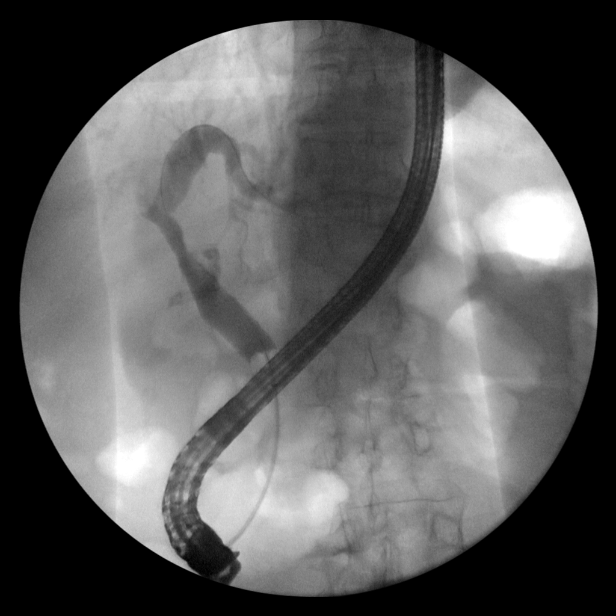
[im 12/12  full-range]
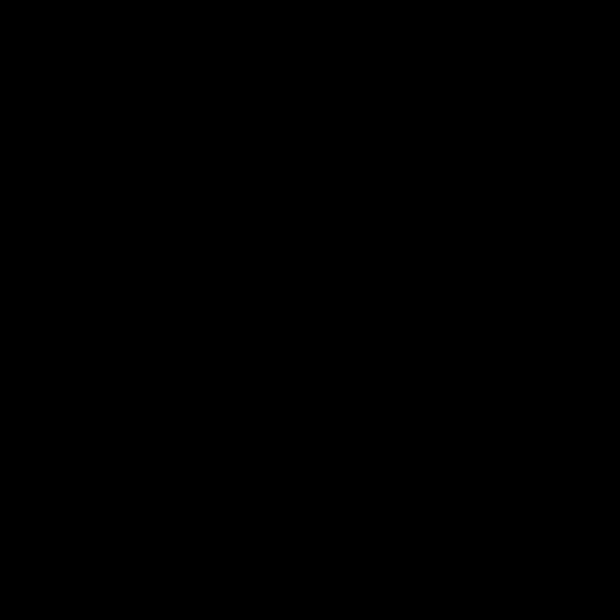

[15 of 15 positions shown; findings below may reference images not displayed]

FINDINGS: Imaging demonstrates cannulation of the common bile duct with
cholangiogram demonstrating moderate dilatation of the biliary tree
and suggestion of a filling defect in the distal common bile duct.
The calculus appears to have been successfully extracted with a
balloon sweep maneuver.
IMPRESSION: Imaging obtained during ERCP with suggestion of filling defect in
the distal common bile duct causing biliary obstruction. The
calculus appears to have been successfully extracted with a balloon
sweep maneuver.

These images were submitted for radiologic interpretation only.
Please see the procedural report for the amount of contrast and the
fluoroscopy time utilized.

## 2021-06-10 ENCOUNTER — Telehealth: Payer: Self-pay | Admitting: Cardiology

## 2021-06-10 NOTE — Telephone Encounter (Signed)
Pharmacy has been verified with pt's wife per DPR that this is [facility] as we do not have a [facility]. ?

## 2021-06-10 NOTE — Telephone Encounter (Signed)
New Message: ? ? ? ? ?Patient wants his pharmacy changed to [place] please.n ?

## 2021-06-11 DIAGNOSIS — R8271 Bacteriuria: Secondary | ICD-10-CM | POA: Diagnosis not present

## 2021-06-11 DIAGNOSIS — N401 Enlarged prostate with lower urinary tract symptoms: Secondary | ICD-10-CM | POA: Diagnosis not present

## 2021-06-11 DIAGNOSIS — R3912 Poor urinary stream: Secondary | ICD-10-CM | POA: Diagnosis not present

## 2021-06-11 DIAGNOSIS — N200 Calculus of kidney: Secondary | ICD-10-CM | POA: Diagnosis not present

## 2021-06-12 ENCOUNTER — Telehealth: Payer: Self-pay | Admitting: Cardiology

## 2021-06-12 MED ORDER — ATORVASTATIN CALCIUM 20 MG PO TABS: 20.0000 mg | ORAL_TABLET | Freq: Every day | ORAL | 2 refills | Status: AC

## 2021-06-12 MED ORDER — HYDROCHLOROTHIAZIDE 12.5 MG PO TABS: 12.5000 mg | ORAL_TABLET | Freq: Every day | ORAL | 2 refills | Status: DC

## 2021-06-12 MED ORDER — CARVEDILOL 12.5 MG PO TABS: 12.5000 mg | ORAL_TABLET | Freq: Two times a day (BID) | ORAL | 2 refills | Status: DC

## 2021-06-12 MED ORDER — POTASSIUM CHLORIDE ER 10 MEQ PO TBCR: 10.0000 meq | EXTENDED_RELEASE_TABLET | Freq: Every day | ORAL | 2 refills | Status: DC

## 2021-06-12 MED ORDER — FUROSEMIDE 20 MG PO TABS: 20.0000 mg | ORAL_TABLET | Freq: Every day | ORAL | 2 refills | Status: DC

## 2021-06-12 MED ORDER — APIXABAN 5 MG PO TABS: 5.0000 mg | ORAL_TABLET | Freq: Two times a day (BID) | ORAL | 2 refills | Status: DC

## 2021-06-12 MED ORDER — BENAZEPRIL HCL 20 MG PO TABS: 20.0000 mg | ORAL_TABLET | Freq: Every day | ORAL | 2 refills | Status: DC

## 2021-06-12 NOTE — Telephone Encounter (Signed)
Pt states that new pharmacy that he is now using is [facility]. Telephone number is [phone]. Pt states that you can call him if any other info is needed. Please advise ?

## 2021-06-12 NOTE — Telephone Encounter (Signed)
Verified that [facility] and [facility] are the same and pt confirmed. [facility] and [facility] is aware. ?

## 2021-06-16 ENCOUNTER — Telehealth: Payer: Self-pay | Admitting: Cardiology

## 2021-06-16 MED ORDER — HYDROCHLOROTHIAZIDE 12.5 MG PO TABS: 12.5000 mg | ORAL_TABLET | Freq: Every day | ORAL | 3 refills | Status: DC

## 2021-06-16 MED ORDER — POTASSIUM CHLORIDE ER 10 MEQ PO TBCR: 10.0000 meq | EXTENDED_RELEASE_TABLET | Freq: Every day | ORAL | 3 refills | Status: DC

## 2021-06-16 NOTE — Telephone Encounter (Signed)
?*  STAT* If patient is at the pharmacy, call can be transferred to refill team. ? ? ?1. Which medications need to be refilled? (please list name of each medication and dose if known)  ?hydrochlorothiazide (HYDRODIURIL) 12.5 MG tablet ?potassium chloride (KLOR-CON) 10 MEQ tablet ? ?2. Which pharmacy/location (including street and city if local pharmacy) is medication to be sent to? ?[facility] [phone] - [place], [address] AT [address] ? ?3. Do they need a 30 day or 90 day supply? 90 with refills ? ?Patient said these medications were denied by his insurance company to be filled by the mail order service. He will need them to go to his local [facility]  ?

## 2021-06-16 NOTE — Telephone Encounter (Signed)
Refills sent to pharmacy of choice, patient notified. ?

## 2021-06-17 DIAGNOSIS — D649 Anemia, unspecified: Secondary | ICD-10-CM | POA: Diagnosis not present

## 2021-06-19 DIAGNOSIS — R109 Unspecified abdominal pain: Secondary | ICD-10-CM | POA: Diagnosis not present

## 2021-06-19 DIAGNOSIS — R195 Other fecal abnormalities: Secondary | ICD-10-CM | POA: Diagnosis not present

## 2021-06-19 DIAGNOSIS — D649 Anemia, unspecified: Secondary | ICD-10-CM | POA: Diagnosis not present

## 2021-06-20 DIAGNOSIS — R109 Unspecified abdominal pain: Secondary | ICD-10-CM | POA: Diagnosis not present

## 2021-06-20 DIAGNOSIS — Z6826 Body mass index (BMI) 26.0-26.9, adult: Secondary | ICD-10-CM | POA: Diagnosis not present

## 2021-06-20 DIAGNOSIS — R1904 Left lower quadrant abdominal swelling, mass and lump: Secondary | ICD-10-CM | POA: Diagnosis not present

## 2021-06-23 DIAGNOSIS — N281 Cyst of kidney, acquired: Secondary | ICD-10-CM | POA: Diagnosis not present

## 2021-06-23 DIAGNOSIS — N200 Calculus of kidney: Secondary | ICD-10-CM | POA: Diagnosis not present

## 2021-06-23 DIAGNOSIS — R1904 Left lower quadrant abdominal swelling, mass and lump: Secondary | ICD-10-CM | POA: Diagnosis not present

## 2021-06-23 DIAGNOSIS — K5730 Diverticulosis of large intestine without perforation or abscess without bleeding: Secondary | ICD-10-CM | POA: Diagnosis not present

## 2021-06-23 DIAGNOSIS — K8689 Other specified diseases of pancreas: Secondary | ICD-10-CM | POA: Diagnosis not present

## 2021-06-24 DIAGNOSIS — R195 Other fecal abnormalities: Secondary | ICD-10-CM | POA: Diagnosis not present

## 2021-06-25 ENCOUNTER — Encounter: Payer: Self-pay | Admitting: Cardiology

## 2021-06-25 ENCOUNTER — Telehealth: Payer: Self-pay

## 2021-06-25 NOTE — Telephone Encounter (Signed)
Pt called with questions about blood work in 2021. Discussed with pt Dr. Agustin Cree referred him to Oncology to follow up after this blood work was obtained. He stated that he does recall and has followed up on this test. He had no further questions or concerns.

## 2021-06-27 ENCOUNTER — Ambulatory Visit: Payer: Medicare PPO | Admitting: Sports Medicine

## 2021-07-01 ENCOUNTER — Encounter: Payer: Self-pay | Admitting: Cardiology

## 2021-07-02 ENCOUNTER — Ambulatory Visit (INDEPENDENT_AMBULATORY_CARE_PROVIDER_SITE_OTHER): Payer: Medicare PPO | Admitting: Sports Medicine

## 2021-07-02 ENCOUNTER — Encounter: Payer: Self-pay | Admitting: Sports Medicine

## 2021-07-02 DIAGNOSIS — M79674 Pain in right toe(s): Secondary | ICD-10-CM

## 2021-07-02 DIAGNOSIS — M79675 Pain in left toe(s): Secondary | ICD-10-CM

## 2021-07-02 DIAGNOSIS — E291 Testicular hypofunction: Secondary | ICD-10-CM

## 2021-07-02 DIAGNOSIS — B351 Tinea unguium: Secondary | ICD-10-CM

## 2021-07-02 DIAGNOSIS — M2140 Flat foot [pes planus] (acquired), unspecified foot: Secondary | ICD-10-CM

## 2021-07-02 DIAGNOSIS — I739 Peripheral vascular disease, unspecified: Secondary | ICD-10-CM

## 2021-07-02 DIAGNOSIS — E119 Type 2 diabetes mellitus without complications: Secondary | ICD-10-CM

## 2021-07-02 HISTORY — DX: Flat foot (pes planus) (acquired), unspecified foot: M21.40

## 2021-07-02 HISTORY — DX: Testicular hypofunction: E29.1

## 2021-07-02 NOTE — Progress Notes (Signed)
Subjective: Spencer Cochran is a 76 y.o. male patient seen today in office with complaint of mildly painful thickened and elongated toenails; unable to trim.  Patient reports that his last blood sugar was not recorded, last A1c 5.8 and is now considered prediabetic.  No other pedal complaints noted. Maryella Shivers, MD, PCP last visit 3 weeks ago.   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   Low back pain, unspecified 03/11/2021   Impacted cerumen, left ear 10/09/2020   Other specified counseling 10/09/2020   Tinnitus, bilateral 10/09/2020   Benign neoplasm of colon 06/13/2020   Obesity 06/13/2020   Kidney stone 06/13/2020   Endocarditis 06/13/2020   Encounter for fitting and adjustment of hearing aid 06/13/2020   Blood in urine 06/13/2020   Sensorineural hearing loss, bilateral 06/13/2020   Coronary artery disease    COVID-19 virus infection 02/2020   Unsteadiness    Pre-diabetes    OSA on CPAP    Nocturia    Left ventricular diastolic dysfunction    Impaired hearing    Hypertension    History of kidney stones    History of colon polyps    Frequency of urination    Dysrhythmia    GERD (gastroesophageal reflux disease)    DDD (degenerative disc disease), lumbar    Cancer ([place])    Asymptomatic gallstones    Arthritis    Palpitations 08/10/2019   Obstructive sleep apnea 06/05/2019   Obstructive jaundice    Choledocholithiasis    Biliary obstruction    Gait abnormality 08/03/2018   Paresthesia 07/18/2018   Dyslipidemia 10/01/2016   Coronary artery disease involving native coronary artery of native heart without angina pectoris 10/09/2014   Atrial fibrillation ([place]) 03/08/2014   Benign prostatic hypertrophy with incomplete bladder emptying 04/20/2013    Current Outpatient Medications on File Prior to Visit  Medication Sig Dispense Refill    NEOMYCIN-POLYMYXIN-HYDROCORTISONE (CORTISPORIN) 1 % SOLN OTIC solution      apixaban (ELIQUIS) 5 MG TABS tablet Take 1 tablet (5 mg total) by mouth 2 (two) times daily. 180 tablet 2   atorvastatin (LIPITOR) 20 MG tablet Take 1 tablet (20 mg total) by mouth at bedtime. Taking 20 MG Daily 90 tablet 2   benazepril (LOTENSIN) 20 MG tablet Take 1 tablet (20 mg total) by mouth daily. 90 tablet 2   carvedilol (COREG) 12.5 MG tablet Take 1 tablet (12.5 mg total) by mouth 2 (two) times daily. 180 tablet 2   ferrous sulfate 325 (65 FE) MG tablet Take 325 mg by mouth daily.     furosemide (LASIX) 20 MG tablet Take 1 tablet (20 mg total) by mouth daily. 90 tablet 2   hydrochlorothiazide (HYDRODIURIL) 12.5 MG tablet Take 1 tablet (12.5 mg total) by mouth daily. 90 tablet 3   nitroGLYCERIN (NITROSTAT) 0.4 MG SL tablet Place 1 tablet (0.4 mg total) under the tongue every 5 (five) minutes x 3 doses as needed for chest pain. 25 tablet 10   Omega-3 Fatty Acids (OMEGA 3 PO) Take 520 mg by mouth daily.     omeprazole (PRILOSEC) 20 MG capsule Take 20 mg by mouth daily.      potassium chloride (KLOR-CON) 10 MEQ tablet Take 1 tablet (10 mEq total) by mouth daily. 90 tablet 3   Probiotic Product (PROBIOTIC DAILY PO) Take 1 tablet by mouth daily. Unknown strength     timolol (TIMOPTIC) 0.5 %  ophthalmic solution Place 1 drop into both eyes 2 (two) times daily.      traZODone (DESYREL) 50 MG tablet Take 75 mg by mouth at bedtime.     triamcinolone cream (KENALOG) 0.1 % Apply 1 application topically daily as needed (skin irritation).      No current facility-administered medications on file prior to visit.    Allergies  Allergen Reactions   Hydrocodone Other (See Comments)    insomnia "awake for 48 hours"    Objective: Physical Exam  General: Well developed, nourished, no acute distress, awake, alert and oriented x 3  Vascular: Dorsalis pedis artery 1/4 bilateral, Posterior tibial artery 0/4 bilateral due to  trace edema at ankles bilateral.  Neurological: Gross sensation present via light touch bilateral.   Dermatological: Skin is warm, dry, and supple bilateral, Nails 1-10 are tender, long, thick, and discolored with mild subungal debris, there is minimal keratotic lesion/corn noted to the right fourth toe.  No open lesions present bilateral.  No signs of infection bilateral.  Musculoskeletal: Fourth hammertoe bilateral and pes planus deformities noted bilateral. Muscular strength within normal limits without painon range of motion. No pain with calf compression bilateral.  Assessment and Plan:  Problem List Items Addressed This Visit   None Visit Diagnoses     Pain due to onychomycosis of toenails of both feet    -  Primary   Diabetes mellitus without complication ([place])       PRE   PVD (peripheral vascular disease) ([place])          -Examined patient.  -Re-Discussed treatment options for painful mycotic nails. -Mechanically debrided and reduced all painful mycotic nails x10 in length and girth with sterile nail nipper and dremel nail file without incident -Patient to return as needed nail care in 2.5 to 3 months or sooner if symptoms worsen.  Landis Martins, DPM

## 2021-07-03 DIAGNOSIS — E782 Mixed hyperlipidemia: Secondary | ICD-10-CM | POA: Diagnosis not present

## 2021-07-03 DIAGNOSIS — I10 Essential (primary) hypertension: Secondary | ICD-10-CM | POA: Diagnosis not present

## 2021-07-16 DIAGNOSIS — K4091 Unilateral inguinal hernia, without obstruction or gangrene, recurrent: Secondary | ICD-10-CM | POA: Diagnosis not present

## 2021-07-22 ENCOUNTER — Telehealth: Payer: Self-pay

## 2021-07-22 NOTE — Telephone Encounter (Signed)
Patient with diagnosis of afib on Eliquis for anticoagulation.    Procedure: laproscopic repair of left inguinal hernia with mesh Date of procedure: 07/28/21   CHA2DS2-VASc Score = 5   This indicates a 7.2% annual risk of stroke. The patient's score is based upon: CHF History: 1 HTN History: 1 Diabetes History: 0 Stroke History: 0 Vascular Disease History: 1 Age Score: 2 Gender Score: 0      CrCl 74 ml/min  Per office protocol, patient can hold Eliquis for 2 days prior to procedure.

## 2021-07-22 NOTE — Telephone Encounter (Signed)
   Pre-operative Risk Assessment    Patient Name: Spencer Cochran  DOB: [birth year] MRN: [id number]      Request for Surgical Clearance    Procedure:   laproscopic repair of left inguinal hernia with mesh  Date of Surgery:  Clearance 07/28/21                                 Surgeon:  Dr. Romualdo Bolk Group or Practice Name:  [facility] Phone number:  [phone] Fax number:  [phone]   Type of Clearance Requested:   - Pharmacy:  Hold Apixaban (Eliquis) 48 hours prior to surgery   Type of Anesthesia:  General    Additional requests/questions:    Gretchen Short   07/22/2021, 11:58 AM

## 2021-07-23 NOTE — Telephone Encounter (Signed)
   Name: Spencer Cochran  DOB: Feb 26, 1945  MRN: [id number]   Primary Cardiologist: Jenne Campus, MD  Chart reviewed as part of pre-operative protocol coverage.   We have been asked for guidance to hold eliquis for laproscopic repair of left inguinal hernia with mesh. Per our clinical pharmacist: Per office protocol, patient can hold Eliquis for 2 days prior to procedure.    I will route this recommendation to the requesting party via Epic fax function and remove from pre-op pool. Please call with questions.  Tami Lin Kessler Kopinski, PA 07/23/2021, 8:14 AM

## 2021-07-28 DIAGNOSIS — I110 Hypertensive heart disease with heart failure: Secondary | ICD-10-CM | POA: Diagnosis not present

## 2021-07-28 DIAGNOSIS — E785 Hyperlipidemia, unspecified: Secondary | ICD-10-CM | POA: Diagnosis not present

## 2021-07-28 DIAGNOSIS — I519 Heart disease, unspecified: Secondary | ICD-10-CM | POA: Diagnosis not present

## 2021-07-28 DIAGNOSIS — I2510 Atherosclerotic heart disease of native coronary artery without angina pectoris: Secondary | ICD-10-CM | POA: Diagnosis not present

## 2021-07-28 DIAGNOSIS — I509 Heart failure, unspecified: Secondary | ICD-10-CM | POA: Diagnosis not present

## 2021-07-28 DIAGNOSIS — I4891 Unspecified atrial fibrillation: Secondary | ICD-10-CM | POA: Diagnosis not present

## 2021-07-28 DIAGNOSIS — E119 Type 2 diabetes mellitus without complications: Secondary | ICD-10-CM | POA: Diagnosis not present

## 2021-07-28 DIAGNOSIS — N400 Enlarged prostate without lower urinary tract symptoms: Secondary | ICD-10-CM | POA: Diagnosis not present

## 2021-07-28 DIAGNOSIS — K4091 Unilateral inguinal hernia, without obstruction or gangrene, recurrent: Secondary | ICD-10-CM | POA: Diagnosis not present

## 2021-08-02 DIAGNOSIS — I10 Essential (primary) hypertension: Secondary | ICD-10-CM | POA: Diagnosis not present

## 2021-08-02 DIAGNOSIS — E782 Mixed hyperlipidemia: Secondary | ICD-10-CM | POA: Diagnosis not present

## 2021-08-06 DIAGNOSIS — N529 Male erectile dysfunction, unspecified: Secondary | ICD-10-CM | POA: Diagnosis not present

## 2021-08-06 DIAGNOSIS — N390 Urinary tract infection, site not specified: Secondary | ICD-10-CM | POA: Diagnosis not present

## 2021-08-06 DIAGNOSIS — N200 Calculus of kidney: Secondary | ICD-10-CM | POA: Diagnosis not present

## 2021-08-06 DIAGNOSIS — Z79899 Other long term (current) drug therapy: Secondary | ICD-10-CM | POA: Diagnosis not present

## 2021-08-06 DIAGNOSIS — N401 Enlarged prostate with lower urinary tract symptoms: Secondary | ICD-10-CM | POA: Diagnosis not present

## 2021-08-13 DIAGNOSIS — Z1331 Encounter for screening for depression: Secondary | ICD-10-CM | POA: Diagnosis not present

## 2021-08-13 DIAGNOSIS — Z1339 Encounter for screening examination for other mental health and behavioral disorders: Secondary | ICD-10-CM | POA: Diagnosis not present

## 2021-08-13 DIAGNOSIS — Z0000 Encounter for general adult medical examination without abnormal findings: Secondary | ICD-10-CM | POA: Diagnosis not present

## 2021-08-13 DIAGNOSIS — Z1389 Encounter for screening for other disorder: Secondary | ICD-10-CM | POA: Diagnosis not present

## 2021-08-13 DIAGNOSIS — Z136 Encounter for screening for cardiovascular disorders: Secondary | ICD-10-CM | POA: Diagnosis not present

## 2021-08-13 DIAGNOSIS — Z789 Other specified health status: Secondary | ICD-10-CM | POA: Diagnosis not present

## 2021-08-13 DIAGNOSIS — Z139 Encounter for screening, unspecified: Secondary | ICD-10-CM | POA: Diagnosis not present

## 2021-08-15 ENCOUNTER — Ambulatory Visit (INDEPENDENT_AMBULATORY_CARE_PROVIDER_SITE_OTHER): Payer: Medicare PPO | Admitting: Cardiology

## 2021-08-15 ENCOUNTER — Encounter: Payer: Self-pay | Admitting: Cardiology

## 2021-08-15 VITALS — BP 110/73 | HR 76 | Ht 68.0 in | Wt 187.4 lb

## 2021-08-15 DIAGNOSIS — I10 Essential (primary) hypertension: Secondary | ICD-10-CM | POA: Diagnosis not present

## 2021-08-15 DIAGNOSIS — R002 Palpitations: Secondary | ICD-10-CM | POA: Diagnosis not present

## 2021-08-15 DIAGNOSIS — I2510 Atherosclerotic heart disease of native coronary artery without angina pectoris: Secondary | ICD-10-CM | POA: Diagnosis not present

## 2021-08-15 DIAGNOSIS — I519 Heart disease, unspecified: Secondary | ICD-10-CM

## 2021-08-15 NOTE — Progress Notes (Signed)
Cardiology Office Note:    Date:  08/15/2021   ID:  Spencer Cochran, DOB March 12, 1945, MRN [id number]  PCP:  Maryella Shivers, MD  Cardiologist:  Jenne Campus, MD    Referring MD: Maryella Shivers, MD   No chief complaint on file. Doing fine I get hernia surgery  History of Present Illness:    Spencer Cochran is a 76 y.o. male with past medical history significant for coronary artery disease luminal disease many years ago since that time stress test negative, permanent atrial fibrillation, diastolic congestive heart failure.  There was some time that we suspect amyloidosis however work-up since that time has been negative.  He used to be very active goes to gym on the regular basis however he got surgery for he is a hernia recently and he is still recovering from it.  Denies have any chest pain tightness squeezing pressure burning chest but had a lot of questions today about his condition congestive heart failure coronary artery disease all his questions were answered to his satisfaction  Past Medical History:  Diagnosis Date   Arthritis knees and ankle   Asymptomatic gallstones    Atrial fibrillation ([place]) 03/08/2014   Benign neoplasm of colon 06/13/2020   Benign prostatic hypertrophy with incomplete bladder emptying 04/20/2013   Biliary obstruction    Cancer (HCC)    skin    Cataract immature BILATERAL EYES   Choledocholithiasis    Coronary artery disease CARDIOLOGIST-  DR Agustin Cree  [facility])---  LAST VISIT 04-29-2011   DENIES CARDIAC SYMPTOMS   Coronary artery disease involving native coronary artery of native heart without angina pectoris 10/09/2014   Luminal disease but cardiac catheterization from 2014, stress test in the summer of 2018 showed no evidence of ischemia  Formatting of this note might be different from the original. 20% circumflex 20% RCA in July 2014   COVID-19 virus infection 02/2020   DDD (degenerative disc disease), lumbar    Dyslipidemia 10/01/2016    Dysrhythmia    A Fib    Endocarditis 06/13/2020   Apr 25, 2007 Entered By: Mahlon Gammon C Comment: AORTIC STENOSIS, MITRAL AND TRICUSPID REGURGITATION   Frequency of urination    Gait abnormality 08/03/2018   GERD (gastroesophageal reflux disease)    Glaucoma    both eyes   History of colon polyps    History of kidney stones    Hypertension    Impaired hearing has bilateral aids--  but does not wear at all times   Kidney stone 06/13/2020   Apr 25, 2007 Entered By: Mahlon Gammon C Comment: TREATED WITH LITHOTRIPSY   Left ventricular diastolic dysfunction PER ECHO 03-31-2011  W/ CHART   Low back pain, unspecified 03/11/2021   Nocturia    Obstructive jaundice    Obstructive sleep apnea 06/05/2019   OSA on CPAP    wears cpap   Palpitations 08/10/2019   Paresthesia 07/18/2018   Pre-diabetes    last hemaglobin a 1 c was 5.6   Unsteadiness    Urethral stricture     Past Surgical History:  Procedure Laterality Date   APPENDECTOMY  1972   BALLOON DILATION N/A 04/13/2019   Procedure: Larrie Kass DILATION;  Surgeon: Jackquline Denmark, MD;  Location: [facility];  Service: Endoscopy;  Laterality: N/A;   CARDIOVASCULAR STRESS TEST  04-29-2010   DR KRASAWSKI   NO EVIDENCE ISCHEMIA/ NORMAL LVSF AND WALL MOTION/ EF 63%   CERVICAL FUSION  2006   C3 - 6   COLONOSCOPY  03/23/2012   Small colonic  polyps, status post polypectomy. Pancolonic diverticulosis predominantly in the left colon.Small internal and external hemorrhoids   COLONOSCOPY  2019   Dr Orlena Sheldon. Diverticulosis. Doesn't think he removed polpys    CYSTO/ URETHRAL DILATION/ TRANSURETHRAL INCISIONOF PROSTATE  01-13-2007   CYSTOSCOPY  2005   CYSTOSCOPY W/ RETROGRADES  06/12/2011   Procedure: CYSTOSCOPY WITH RETROGRADE PYELOGRAM;  Surgeon: Ailene Rud, MD;  [facility];  Service: Urology;  Laterality: N/A;  cysto, urethral dilation, right retrograde pyelogram     CYSTOSCOPY WITH RETROGRADE PYELOGRAM, URETEROSCOPY AND  STENT PLACEMENT Left 03/08/2014   Procedure: CYSTOSCOPY WITH LEFT RETROGRADE PYELOGRAM/LEFT  URETEROSCOPY;  Surgeon: Ailene Rud, MD;  Location: [facility];  Service: Urology;  Laterality: Left;   CYSTOSCOPY WITH URETHRAL DILATATION N/A 04/20/2013   Procedure: CYSTOSCOPY WITH URETHRAL DILATATION;  Surgeon: Ailene Rud, MD;  Location: [facility];  Service: Urology;  Laterality: N/A;   CYSTOSCOPY/URETEROSCOPY/HOLMIUM LASER/STENT PLACEMENT Left 06/10/2017   Procedure: CYSTOSCOPY/RETROGRADE/URETEROSCOPY/HOLMIUM LASER/STENT PLACEMENT;  Surgeon: Raynelle Bring, MD;  Location: [facility];  Service: Urology;  Laterality: Left;   CYSTOSCOPY/URETEROSCOPY/HOLMIUM LASER/STENT PLACEMENT Right 03/09/2019   Procedure: CYSTOSCOPY/RETROGRADE/URETEROSCOPY/HOLMIUM LASER/STENT PLACEMENT;  Surgeon: Raynelle Bring, MD;  Location: [facility];  Service: Urology;  Laterality: Right;  ONLY NEEDS 60 MIN   ENDOSCOPIC RETROGRADE CHOLANGIOPANCREATOGRAPHY (ERCP) WITH PROPOFOL N/A 04/13/2019   Procedure: ENDOSCOPIC RETROGRADE CHOLANGIOPANCREATOGRAPHY (ERCP) WITH PROPOFOL;  Surgeon: Jackquline Denmark, MD;  Location[facility];  Service: Endoscopy;  Laterality: N/A;   EXTRACORPOREAL SHOCK WAVE LITHOTRIPSY  01-17-2007   LEFT   HERNIA REPAIR  01/30/2019   [facility] Left inguinal hernia repair   HOLMIUM LASER APPLICATION Left 07/06/1495   Procedure: LASER OF LEFT RENAL PELVIC STONE;  Surgeon: Ailene Rud, MD;  Location: [facility];  Service: Urology;  Laterality: Left;   INGUINAL HERNIA REPAIR  2003   JOINT REPLACEMENT     total knee right 03-01-17   LEFT ACHILLES TENDON REPAIR  1992   MASS EXCISION N/A 11/14/2018   Procedure: EXCISIONAL BIOPSY OF GLANS PENIS;  Surgeon: Raynelle Bring, MD;  Location: [facility];  Service: Urology;  Laterality: N/A;  ONLY NEEDS 60 MIN   NASAL SINUS SURGERY  2012   PENILE SURGERY  OF MEATUS  1955   REMOVAL OF STONES  04/13/2019   Procedure: REMOVAL OF STONES;  Surgeon: Jackquline Denmark, MD;  Location: [facility];  Service: Endoscopy;;   SPHINCTEROTOMY  04/13/2019   Procedure: [name];  Surgeon: Jackquline Denmark, MD;  Location: [facility];  Service: Endoscopy;;   TRANSTHORACIC ECHOCARDIOGRAM  03-31-2011   NORMAL LVEF  59%/ TRIVIAL MR/ DIASTOLIC DYSFUNCTION/ MODERATE LVH   TRANSURETHRAL RESECTION OF PROSTATE N/A 04/20/2013   Procedure: TRANSURETHRAL RESECTION OF THE PROSTATE WITH GYRUS INSTRUMENTS;  Surgeon: Ailene Rud, MD;  Location: [facility];  Service: Urology;  Laterality: N/A;    Current Medications: Current Meds  Medication Sig   apixaban (ELIQUIS) 5 MG TABS tablet Take 1 tablet (5 mg total) by mouth 2 (two) times daily.   atorvastatin (LIPITOR) 20 MG tablet Take 1 tablet (20 mg total) by mouth at bedtime. Taking 20 MG Daily   benazepril (LOTENSIN) 20 MG tablet Take 1 tablet (20 mg total) by mouth daily.   carvedilol (COREG) 12.5 MG tablet Take 1 tablet (12.5 mg total) by mouth 2 (two) times daily.   ferrous sulfate 325 (65 FE) MG tablet Take 325 mg by mouth daily.   furosemide (LASIX) 20 MG tablet Take 1 tablet (20  mg total) by mouth daily.   hydrochlorothiazide (HYDRODIURIL) 12.5 MG tablet Take 1 tablet (12.5 mg total) by mouth daily.   NEOMYCIN-POLYMYXIN-HYDROCORTISONE (CORTISPORIN) 1 % SOLN OTIC solution    nitroGLYCERIN (NITROSTAT) 0.4 MG SL tablet Place 1 tablet (0.4 mg total) under the tongue every 5 (five) minutes x 3 doses as needed for chest pain.   Omega-3 Fatty Acids (OMEGA 3 PO) Take 520 mg by mouth daily.   omeprazole (PRILOSEC) 20 MG capsule Take 20 mg by mouth daily.    potassium chloride (KLOR-CON) 10 MEQ tablet Take 1 tablet (10 mEq total) by mouth daily.   Probiotic Product (PROBIOTIC DAILY PO) Take 1 tablet by mouth daily. Unknown strength   timolol (TIMOPTIC) 0.5 % ophthalmic solution Place 1 drop into both eyes 2 (two) times daily.    traZODone (DESYREL) 50 MG tablet Take 75 mg by mouth at bedtime.   triamcinolone cream  (KENALOG) 0.1 % Apply 1 application topically daily as needed (skin irritation).      Allergies:   Hydrocodone   Social History   Socioeconomic History   Marital status: Married    Spouse name: Not on file   Number of children: 2   Years of education: some college   Highest education level: Not on file  Occupational History   Occupation: retired Social research officer, government  Tobacco Use   Smoking status: Never   Smokeless tobacco: Never  Vaping Use   Vaping Use: Never used  Substance and Sexual Activity   Alcohol use: No   Drug use: No   Sexual activity: Yes  Other Topics Concern   Not on file  Social History Narrative   Lives at home with his wife.   Right-handed.   Caffeine use:  32-40 ounces caffeine per day.   Social Determinants of Health   Financial Resource Strain: Not on file  Food Insecurity: Not on file  Transportation Needs: Not on file  Physical Activity: Not on file  Stress: Not on file  Social Connections: Not on file     Family History: The patient's family history includes Breast cancer in his mother; Cirrhosis in his father; Colon cancer in his maternal aunt. There is no history of Esophageal cancer. ROS:   Please see the history of present illness.    All 14 point review of systems negative except as described per history of present illness  EKGs/Labs/Other Studies Reviewed:      Recent Labs: 10/10/2020: BUN 28; Creatinine, Ser 0.82; NT-Pro BNP 729; Potassium 4.6; Sodium 144  Recent Lipid Panel No results found for: "CHOL", "TRIG", "HDL", "CHOLHDL", "VLDL", "LDLCALC", "LDLDIRECT"  Physical Exam:    VS:  BP 110/73   Pulse 76   Ht '5\' 8"'$  (1.727 m)   Wt 187 lb 6.4 oz (85 kg)   SpO2 94%   BMI 28.49 kg/m     Wt Readings from Last 3 Encounters:  08/15/21 187 lb 6.4 oz (85 kg)  03/12/21 187 lb 6.4 oz (85 kg)  10/10/20 188 lb 12.8 oz (85.6 kg)     GEN:  Well nourished, well developed in no acute distress HEENT: Normal NECK: No JVD; No carotid  bruits LYMPHATICS: No lymphadenopathy CARDIAC: RRR, no murmurs, no rubs, no gallops RESPIRATORY:  Clear to auscultation without rales, wheezing or rhonchi  ABDOMEN: Soft, non-tender, non-distended MUSCULOSKELETAL:  No edema; No deformity  SKIN: Warm and dry LOWER EXTREMITIES: no swelling NEUROLOGIC:  Alert and oriented x 3 PSYCHIATRIC:  Normal affect   ASSESSMENT:  1. Coronary artery disease involving native coronary artery of native heart without angina pectoris   2. Primary hypertension   3. Left ventricular diastolic dysfunction   4. Palpitations    PLAN:    In order of problems listed above:  Coronary disease stable from that point review and appropriate medications were I will continue, he is asymptomatic. Permanent atrial fibrillation rate controlled he is on Eliquis which I will continue. Diastolic congestive heart failure seems to be compensated on physical exam.  He is on furosemide 20 mg daily and hydrochlorothiazide.  I told him we need to simplify this management.  I will ask him to have Chem-7 done if Chem-7 is fine we will simply double the dose of Lasix to 40 and continue with present dose of potassium which is 10 mg a day.  I will increase the dose of potassium can be modified based on Chem-7. Palpitations.  Denies having any   Medication Adjustments/Labs and Tests Ordered: Current medicines are reviewed at length with the patient today.  Concerns regarding medicines are outlined above.  Orders Placed This Encounter  Procedures   Basic metabolic panel   Medication changes: No orders of the defined types were placed in this encounter.   Signed, Park Liter, MD, [facility] 08/15/2021 12:36 PM    [place]

## 2021-08-15 NOTE — Patient Instructions (Signed)
Medication Instructions:  Your physician recommends that you continue on your current medications as directed. Please refer to the Current Medication list given to you today.  *If you need a refill on your cardiac medications before your next appointment, please call your pharmacy*   Lab Work: Your physician recommends that you have a BMET done today in the office.  If you have labs (blood work) drawn today and your tests are completely normal, you will receive your results only by: [place] (if you have MyChart) OR A paper copy in the mail If you have any lab test that is abnormal or we need to change your treatment, we will call you to review the results.   Testing/Procedures: None ordered   Follow-Up: At [facility], you and your health needs are our priority.  As part of our continuing mission to provide you with exceptional heart care, we have created designated Provider Care Teams.  These Care Teams include your primary Cardiologist (physician) and Advanced Practice Providers (APPs -  Physician Assistants and Nurse Practitioners) who all work together to provide you with the care you need, when you need it.  We recommend signing up for the patient portal called "MyChart".  Sign up information is provided on this After Visit Summary.  MyChart is used to connect with patients for Virtual Visits (Telemedicine).  Patients are able to view lab/test results, encounter notes, upcoming appointments, etc.  Non-urgent messages can be sent to your provider as well.   To learn more about what you can do with MyChart, go to [email or web address].    Your next appointment:   6 month(s)  The format for your next appointment:   In Person  Provider:   Jenne Campus, MD   Other Instructions NA

## 2021-08-16 ENCOUNTER — Encounter: Payer: Self-pay | Admitting: Cardiology

## 2021-08-16 LAB — BASIC METABOLIC PANEL
BUN/Creatinine Ratio: 35 — ABNORMAL HIGH (ref 10–24)
BUN: 25 mg/dL (ref 8–27)
CO2: 28 mmol/L (ref 20–29)
Calcium: 9 mg/dL (ref 8.6–10.2)
Chloride: 104 mmol/L (ref 96–106)
Creatinine, Ser: 0.71 mg/dL — ABNORMAL LOW (ref 0.76–1.27)
Glucose: 91 mg/dL (ref 70–99)
Potassium: 3.9 mmol/L (ref 3.5–5.2)
Sodium: 144 mmol/L (ref 134–144)
eGFR: 94 mL/min/{1.73_m2} (ref >59)

## 2021-08-18 ENCOUNTER — Telehealth: Payer: Self-pay

## 2021-08-18 DIAGNOSIS — I10 Essential (primary) hypertension: Secondary | ICD-10-CM

## 2021-08-18 DIAGNOSIS — I4821 Permanent atrial fibrillation: Secondary | ICD-10-CM

## 2021-08-18 MED ORDER — FUROSEMIDE 40 MG PO TABS: 40.0000 mg | ORAL_TABLET | Freq: Every day | ORAL | 3 refills | Status: DC

## 2021-08-18 NOTE — Telephone Encounter (Signed)
Results reviewed with pt as per Dr. Wendy Poet note. Pt will come back for Lab work in 10 days. Lab req sent to lab.  Pt verbalized understanding and had no additional questions. Routed to PCP

## 2021-08-19 DIAGNOSIS — H43393 Other vitreous opacities, bilateral: Secondary | ICD-10-CM | POA: Diagnosis not present

## 2021-08-28 DIAGNOSIS — I10 Essential (primary) hypertension: Secondary | ICD-10-CM | POA: Diagnosis not present

## 2021-08-28 DIAGNOSIS — I4821 Permanent atrial fibrillation: Secondary | ICD-10-CM | POA: Diagnosis not present

## 2021-08-29 LAB — BASIC METABOLIC PANEL
BUN/Creatinine Ratio: 26 — ABNORMAL HIGH (ref 10–24)
BUN: 24 mg/dL (ref 8–27)
CO2: 26 mmol/L (ref 20–29)
Calcium: 9.2 mg/dL (ref 8.6–10.2)
Chloride: 105 mmol/L (ref 96–106)
Creatinine, Ser: 0.94 mg/dL (ref 0.76–1.27)
Glucose: 115 mg/dL — ABNORMAL HIGH (ref 70–99)
Potassium: 3.9 mmol/L (ref 3.5–5.2)
Sodium: 146 mmol/L — ABNORMAL HIGH (ref 134–144)
eGFR: 83 mL/min/{1.73_m2} (ref >59)

## 2021-09-02 ENCOUNTER — Telehealth: Payer: Self-pay

## 2021-09-02 DIAGNOSIS — E782 Mixed hyperlipidemia: Secondary | ICD-10-CM | POA: Diagnosis not present

## 2021-09-02 DIAGNOSIS — I10 Essential (primary) hypertension: Secondary | ICD-10-CM | POA: Diagnosis not present

## 2021-09-02 NOTE — Telephone Encounter (Signed)
-----   Message from Park Liter, MD sent at 08/29/2021 10:21 AM EDT ----- Chem-7 looks good, continue present management

## 2021-09-02 NOTE — Telephone Encounter (Signed)
Patient notified of results.

## 2021-09-15 DIAGNOSIS — L570 Actinic keratosis: Secondary | ICD-10-CM | POA: Diagnosis not present

## 2021-09-15 DIAGNOSIS — L821 Other seborrheic keratosis: Secondary | ICD-10-CM | POA: Diagnosis not present

## 2021-09-15 DIAGNOSIS — L578 Other skin changes due to chronic exposure to nonionizing radiation: Secondary | ICD-10-CM | POA: Diagnosis not present

## 2021-09-16 DIAGNOSIS — N390 Urinary tract infection, site not specified: Secondary | ICD-10-CM | POA: Diagnosis not present

## 2021-09-16 DIAGNOSIS — N401 Enlarged prostate with lower urinary tract symptoms: Secondary | ICD-10-CM | POA: Diagnosis not present

## 2021-09-16 DIAGNOSIS — N200 Calculus of kidney: Secondary | ICD-10-CM | POA: Diagnosis not present

## 2021-09-16 DIAGNOSIS — Z125 Encounter for screening for malignant neoplasm of prostate: Secondary | ICD-10-CM | POA: Diagnosis not present

## 2021-09-16 DIAGNOSIS — N529 Male erectile dysfunction, unspecified: Secondary | ICD-10-CM | POA: Diagnosis not present

## 2021-09-30 ENCOUNTER — Ambulatory Visit: Payer: Medicare PPO | Admitting: Podiatrist

## 2021-10-03 DIAGNOSIS — U071 COVID-19: Secondary | ICD-10-CM | POA: Diagnosis not present

## 2021-10-03 DIAGNOSIS — R059 Cough, unspecified: Secondary | ICD-10-CM | POA: Diagnosis not present

## 2021-10-03 DIAGNOSIS — J1282 Pneumonia due to coronavirus disease 2019: Secondary | ICD-10-CM | POA: Diagnosis not present

## 2021-10-03 DIAGNOSIS — Z20822 Contact with and (suspected) exposure to covid-19: Secondary | ICD-10-CM | POA: Diagnosis not present

## 2021-10-20 DIAGNOSIS — R1032 Left lower quadrant pain: Secondary | ICD-10-CM | POA: Diagnosis not present

## 2021-10-20 DIAGNOSIS — S79912A Unspecified injury of left hip, initial encounter: Secondary | ICD-10-CM | POA: Diagnosis not present

## 2021-10-22 ENCOUNTER — Ambulatory Visit (INDEPENDENT_AMBULATORY_CARE_PROVIDER_SITE_OTHER): Payer: Medicare PPO | Admitting: Podiatry

## 2021-10-22 ENCOUNTER — Encounter: Payer: Self-pay | Admitting: Podiatry

## 2021-10-22 DIAGNOSIS — I739 Peripheral vascular disease, unspecified: Secondary | ICD-10-CM

## 2021-10-22 DIAGNOSIS — E119 Type 2 diabetes mellitus without complications: Secondary | ICD-10-CM

## 2021-10-22 DIAGNOSIS — M79675 Pain in left toe(s): Secondary | ICD-10-CM | POA: Diagnosis not present

## 2021-10-22 DIAGNOSIS — B351 Tinea unguium: Secondary | ICD-10-CM | POA: Diagnosis not present

## 2021-10-22 DIAGNOSIS — M79674 Pain in right toe(s): Secondary | ICD-10-CM

## 2021-10-22 NOTE — Progress Notes (Signed)
Subjective: Spencer Cochran is a 76 y.o. male patient seen today in office with complaint of mildly painful thickened and elongated toenails; unable to trim.  Patient reports that his last blood sugar was not recorded, last A1c 5.8 and is now considered prediabetic.  No other pedal complaints noted. Maryella Shivers, MD, PCP last visit 3 weeks ago.   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   Low back pain, unspecified 03/11/2021   Impacted cerumen, left ear 10/09/2020   Other specified counseling 10/09/2020   Tinnitus, bilateral 10/09/2020   Benign neoplasm of colon 06/13/2020   Obesity 06/13/2020   Kidney stone 06/13/2020   Endocarditis 06/13/2020   Encounter for fitting and adjustment of hearing aid 06/13/2020   Blood in urine 06/13/2020   Sensorineural hearing loss, bilateral 06/13/2020   Coronary artery disease    COVID-19 virus infection 02/2020   Unsteadiness    Pre-diabetes    OSA on CPAP    Nocturia    Left ventricular diastolic dysfunction    Impaired hearing    Hypertension    History of kidney stones    History of colon polyps    Frequency of urination    Dysrhythmia    GERD (gastroesophageal reflux disease)    DDD (degenerative disc disease), lumbar    Cancer ([place])    Asymptomatic gallstones    Arthritis    Palpitations 08/10/2019   Obstructive sleep apnea 06/05/2019   Obstructive jaundice    Choledocholithiasis    Biliary obstruction    Gait abnormality 08/03/2018   Paresthesia 07/18/2018   Dyslipidemia 10/01/2016   Coronary artery disease involving native coronary artery of native heart without angina pectoris 10/09/2014   Atrial fibrillation ([place]) 03/08/2014   Benign prostatic hypertrophy with incomplete bladder emptying 04/20/2013    Current Outpatient Medications on File Prior to Visit  Medication Sig Dispense Refill   apixaban (ELIQUIS) 5 MG  TABS tablet Take 1 tablet (5 mg total) by mouth 2 (two) times daily. 180 tablet 2   atorvastatin (LIPITOR) 20 MG tablet Take 1 tablet (20 mg total) by mouth at bedtime. Taking 20 MG Daily 90 tablet 2   benazepril (LOTENSIN) 20 MG tablet Take 1 tablet (20 mg total) by mouth daily. 90 tablet 2   carvedilol (COREG) 12.5 MG tablet Take 1 tablet (12.5 mg total) by mouth 2 (two) times daily. 180 tablet 2   ferrous sulfate 325 (65 FE) MG tablet Take 325 mg by mouth daily.     furosemide (LASIX) 40 MG tablet Take 1 tablet (40 mg total) by mouth daily. 90 tablet 3   NEOMYCIN-POLYMYXIN-HYDROCORTISONE (CORTISPORIN) 1 % SOLN OTIC solution      nitroGLYCERIN (NITROSTAT) 0.4 MG SL tablet Place 1 tablet (0.4 mg total) under the tongue every 5 (five) minutes x 3 doses as needed for chest pain. 25 tablet 10   Omega-3 Fatty Acids (OMEGA 3 PO) Take 520 mg by mouth daily.     omeprazole (PRILOSEC) 20 MG capsule Take 20 mg by mouth daily.      potassium chloride (KLOR-CON) 10 MEQ tablet Take 1 tablet (10 mEq total) by mouth daily. 90 tablet 3   Probiotic Product (PROBIOTIC DAILY PO) Take 1 tablet by mouth daily. Unknown strength     timolol (TIMOPTIC) 0.5 % ophthalmic solution Place 1 drop into both eyes 2 (two) times daily.      traZODone (  DESYREL) 50 MG tablet Take 75 mg by mouth at bedtime.     triamcinolone cream (KENALOG) 0.1 % Apply 1 application topically daily as needed (skin irritation).      [DISCONTINUED] hydrochlorothiazide (HYDRODIURIL) 12.5 MG tablet Take 1 tablet (12.5 mg total) by mouth daily. 90 tablet 3   No current facility-administered medications on file prior to visit.    Allergies  Allergen Reactions   Hydrocodone Other (See Comments)    insomnia "awake for 48 hours"    Objective: Physical Exam  General: Well developed, nourished, no acute distress, awake, alert and oriented x 3  Vascular: Dorsalis pedis artery 1/4 bilateral, Posterior tibial artery 0/4 bilateral due to trace edema  at ankles bilateral.  Neurological: Gross sensation present via light touch bilateral.   Dermatological: Skin is warm, dry, and supple bilateral, Nails 1-10 are tender, long, thick, and discolored with mild subungal debris, there is minimal keratotic lesion/corn noted to the right fourth toe.  No open lesions present bilateral.  No signs of infection bilateral.  Musculoskeletal: Fourth hammertoe bilateral and pes planus deformities noted bilateral. Muscular strength within normal limits without painon range of motion. No pain with calf compression bilateral.  Assessment and Plan:  Problem List Items Addressed This Visit   None   -Examined patient.  -Re-Discussed treatment options for painful mycotic nails. -Mechanically debrided and reduced all painful mycotic nails x10 in length and girth with sterile nail nipper and dremel nail file without incident -Patient to return as needed nail care in 2.5 to 3 months or sooner if symptoms worsen.  Lorenda Peck, DPM

## 2021-10-23 DIAGNOSIS — M1612 Unilateral primary osteoarthritis, left hip: Secondary | ICD-10-CM | POA: Diagnosis not present

## 2021-10-23 DIAGNOSIS — M25552 Pain in left hip: Secondary | ICD-10-CM | POA: Diagnosis not present

## 2021-10-23 DIAGNOSIS — M7989 Other specified soft tissue disorders: Secondary | ICD-10-CM | POA: Diagnosis not present

## 2021-10-23 DIAGNOSIS — S76312A Strain of muscle, fascia and tendon of the posterior muscle group at thigh level, left thigh, initial encounter: Secondary | ICD-10-CM | POA: Diagnosis not present

## 2021-10-24 ENCOUNTER — Telehealth: Payer: Self-pay | Admitting: *Deleted

## 2021-10-24 DIAGNOSIS — I4821 Permanent atrial fibrillation: Secondary | ICD-10-CM

## 2021-10-24 MED ORDER — APIXABAN 5 MG PO TABS: 5.0000 mg | ORAL_TABLET | Freq: Two times a day (BID) | ORAL | 2 refills | Status: DC

## 2021-10-24 NOTE — Telephone Encounter (Addendum)
Eliquis '5mg'$  refill request received. Patient is 76 years old, weight-85kg, Crea-0.94 on 08/28/2021, Diagnosis-Afib, and last seen by [facility] on 08/15/2021. Dose is appropriate based on dosing criteria. Will send in refill to requested pharmacy.    Also, called pt to clarify which pharmacy to send med to since not in the original encounter.

## 2021-10-27 DIAGNOSIS — S79912A Unspecified injury of left hip, initial encounter: Secondary | ICD-10-CM | POA: Diagnosis not present

## 2021-10-27 DIAGNOSIS — S76312A Strain of muscle, fascia and tendon of the posterior muscle group at thigh level, left thigh, initial encounter: Secondary | ICD-10-CM | POA: Diagnosis not present

## 2021-10-29 DIAGNOSIS — M19012 Primary osteoarthritis, left shoulder: Secondary | ICD-10-CM | POA: Diagnosis not present

## 2021-10-30 ENCOUNTER — Encounter (INDEPENDENT_AMBULATORY_CARE_PROVIDER_SITE_OTHER): Payer: Self-pay

## 2021-10-31 DIAGNOSIS — M62552 Muscle wasting and atrophy, not elsewhere classified, left thigh: Secondary | ICD-10-CM | POA: Diagnosis not present

## 2021-10-31 DIAGNOSIS — M79652 Pain in left thigh: Secondary | ICD-10-CM | POA: Diagnosis not present

## 2021-10-31 DIAGNOSIS — R2689 Other abnormalities of gait and mobility: Secondary | ICD-10-CM | POA: Diagnosis not present

## 2021-11-03 DIAGNOSIS — M62552 Muscle wasting and atrophy, not elsewhere classified, left thigh: Secondary | ICD-10-CM | POA: Diagnosis not present

## 2021-11-03 DIAGNOSIS — R2689 Other abnormalities of gait and mobility: Secondary | ICD-10-CM | POA: Diagnosis not present

## 2021-11-03 DIAGNOSIS — M79652 Pain in left thigh: Secondary | ICD-10-CM | POA: Diagnosis not present

## 2021-11-06 DIAGNOSIS — R21 Rash and other nonspecific skin eruption: Secondary | ICD-10-CM | POA: Diagnosis not present

## 2021-11-06 DIAGNOSIS — Z6828 Body mass index (BMI) 28.0-28.9, adult: Secondary | ICD-10-CM | POA: Diagnosis not present

## 2021-11-07 DIAGNOSIS — M79652 Pain in left thigh: Secondary | ICD-10-CM | POA: Diagnosis not present

## 2021-11-07 DIAGNOSIS — R2689 Other abnormalities of gait and mobility: Secondary | ICD-10-CM | POA: Diagnosis not present

## 2021-11-07 DIAGNOSIS — M62552 Muscle wasting and atrophy, not elsewhere classified, left thigh: Secondary | ICD-10-CM | POA: Diagnosis not present

## 2021-11-10 DIAGNOSIS — G4733 Obstructive sleep apnea (adult) (pediatric): Secondary | ICD-10-CM | POA: Diagnosis not present

## 2021-11-12 DIAGNOSIS — M62552 Muscle wasting and atrophy, not elsewhere classified, left thigh: Secondary | ICD-10-CM | POA: Diagnosis not present

## 2021-11-12 DIAGNOSIS — R21 Rash and other nonspecific skin eruption: Secondary | ICD-10-CM | POA: Diagnosis not present

## 2021-11-12 DIAGNOSIS — R2689 Other abnormalities of gait and mobility: Secondary | ICD-10-CM | POA: Diagnosis not present

## 2021-11-12 DIAGNOSIS — Z6828 Body mass index (BMI) 28.0-28.9, adult: Secondary | ICD-10-CM | POA: Diagnosis not present

## 2021-11-12 DIAGNOSIS — M79652 Pain in left thigh: Secondary | ICD-10-CM | POA: Diagnosis not present

## 2021-11-14 DIAGNOSIS — R2689 Other abnormalities of gait and mobility: Secondary | ICD-10-CM | POA: Diagnosis not present

## 2021-11-14 DIAGNOSIS — M62552 Muscle wasting and atrophy, not elsewhere classified, left thigh: Secondary | ICD-10-CM | POA: Diagnosis not present

## 2021-11-14 DIAGNOSIS — M79652 Pain in left thigh: Secondary | ICD-10-CM | POA: Diagnosis not present

## 2021-11-17 DIAGNOSIS — M62552 Muscle wasting and atrophy, not elsewhere classified, left thigh: Secondary | ICD-10-CM | POA: Diagnosis not present

## 2021-11-17 DIAGNOSIS — R2689 Other abnormalities of gait and mobility: Secondary | ICD-10-CM | POA: Diagnosis not present

## 2021-11-17 DIAGNOSIS — M79652 Pain in left thigh: Secondary | ICD-10-CM | POA: Diagnosis not present

## 2021-11-18 DIAGNOSIS — R7303 Prediabetes: Secondary | ICD-10-CM | POA: Diagnosis not present

## 2021-11-18 DIAGNOSIS — E782 Mixed hyperlipidemia: Secondary | ICD-10-CM | POA: Diagnosis not present

## 2021-11-19 DIAGNOSIS — Z6826 Body mass index (BMI) 26.0-26.9, adult: Secondary | ICD-10-CM | POA: Diagnosis not present

## 2021-11-19 DIAGNOSIS — Z9989 Dependence on other enabling machines and devices: Secondary | ICD-10-CM | POA: Diagnosis not present

## 2021-11-19 DIAGNOSIS — G4733 Obstructive sleep apnea (adult) (pediatric): Secondary | ICD-10-CM | POA: Diagnosis not present

## 2021-11-20 DIAGNOSIS — M62552 Muscle wasting and atrophy, not elsewhere classified, left thigh: Secondary | ICD-10-CM | POA: Diagnosis not present

## 2021-11-20 DIAGNOSIS — M79652 Pain in left thigh: Secondary | ICD-10-CM | POA: Diagnosis not present

## 2021-11-20 DIAGNOSIS — R2689 Other abnormalities of gait and mobility: Secondary | ICD-10-CM | POA: Diagnosis not present

## 2021-11-24 DIAGNOSIS — M62552 Muscle wasting and atrophy, not elsewhere classified, left thigh: Secondary | ICD-10-CM | POA: Diagnosis not present

## 2021-11-24 DIAGNOSIS — Z6827 Body mass index (BMI) 27.0-27.9, adult: Secondary | ICD-10-CM | POA: Diagnosis not present

## 2021-11-24 DIAGNOSIS — I509 Heart failure, unspecified: Secondary | ICD-10-CM | POA: Diagnosis not present

## 2021-11-24 DIAGNOSIS — Z23 Encounter for immunization: Secondary | ICD-10-CM | POA: Diagnosis not present

## 2021-11-24 DIAGNOSIS — M79652 Pain in left thigh: Secondary | ICD-10-CM | POA: Diagnosis not present

## 2021-11-24 DIAGNOSIS — Z139 Encounter for screening, unspecified: Secondary | ICD-10-CM | POA: Diagnosis not present

## 2021-11-24 DIAGNOSIS — R2689 Other abnormalities of gait and mobility: Secondary | ICD-10-CM | POA: Diagnosis not present

## 2021-11-24 DIAGNOSIS — R7303 Prediabetes: Secondary | ICD-10-CM | POA: Diagnosis not present

## 2021-11-24 DIAGNOSIS — E782 Mixed hyperlipidemia: Secondary | ICD-10-CM | POA: Diagnosis not present

## 2021-11-24 DIAGNOSIS — I482 Chronic atrial fibrillation, unspecified: Secondary | ICD-10-CM | POA: Diagnosis not present

## 2021-11-26 DIAGNOSIS — M62552 Muscle wasting and atrophy, not elsewhere classified, left thigh: Secondary | ICD-10-CM | POA: Diagnosis not present

## 2021-11-26 DIAGNOSIS — R2689 Other abnormalities of gait and mobility: Secondary | ICD-10-CM | POA: Diagnosis not present

## 2021-11-26 DIAGNOSIS — M79652 Pain in left thigh: Secondary | ICD-10-CM | POA: Diagnosis not present

## 2021-11-27 DIAGNOSIS — G4733 Obstructive sleep apnea (adult) (pediatric): Secondary | ICD-10-CM | POA: Diagnosis not present

## 2021-12-02 DIAGNOSIS — G4733 Obstructive sleep apnea (adult) (pediatric): Secondary | ICD-10-CM | POA: Diagnosis not present

## 2021-12-02 DIAGNOSIS — G4737 Central sleep apnea in conditions classified elsewhere: Secondary | ICD-10-CM | POA: Diagnosis not present

## 2021-12-03 DIAGNOSIS — I10 Essential (primary) hypertension: Secondary | ICD-10-CM | POA: Diagnosis not present

## 2021-12-03 DIAGNOSIS — I482 Chronic atrial fibrillation, unspecified: Secondary | ICD-10-CM | POA: Diagnosis not present

## 2021-12-05 DIAGNOSIS — M62552 Muscle wasting and atrophy, not elsewhere classified, left thigh: Secondary | ICD-10-CM | POA: Diagnosis not present

## 2021-12-05 DIAGNOSIS — R2689 Other abnormalities of gait and mobility: Secondary | ICD-10-CM | POA: Diagnosis not present

## 2021-12-05 DIAGNOSIS — M79652 Pain in left thigh: Secondary | ICD-10-CM | POA: Diagnosis not present

## 2021-12-08 DIAGNOSIS — S79912A Unspecified injury of left hip, initial encounter: Secondary | ICD-10-CM | POA: Diagnosis not present

## 2021-12-09 DIAGNOSIS — G4733 Obstructive sleep apnea (adult) (pediatric): Secondary | ICD-10-CM | POA: Diagnosis not present

## 2021-12-22 DIAGNOSIS — N390 Urinary tract infection, site not specified: Secondary | ICD-10-CM | POA: Diagnosis not present

## 2021-12-22 DIAGNOSIS — N401 Enlarged prostate with lower urinary tract symptoms: Secondary | ICD-10-CM | POA: Diagnosis not present

## 2021-12-22 DIAGNOSIS — N529 Male erectile dysfunction, unspecified: Secondary | ICD-10-CM | POA: Diagnosis not present

## 2021-12-22 DIAGNOSIS — N200 Calculus of kidney: Secondary | ICD-10-CM | POA: Diagnosis not present

## 2021-12-25 DIAGNOSIS — R195 Other fecal abnormalities: Secondary | ICD-10-CM | POA: Diagnosis not present

## 2021-12-31 DIAGNOSIS — N200 Calculus of kidney: Secondary | ICD-10-CM | POA: Diagnosis not present

## 2022-01-02 DIAGNOSIS — E782 Mixed hyperlipidemia: Secondary | ICD-10-CM | POA: Diagnosis not present

## 2022-01-02 DIAGNOSIS — I482 Chronic atrial fibrillation, unspecified: Secondary | ICD-10-CM | POA: Diagnosis not present

## 2022-01-06 DIAGNOSIS — J069 Acute upper respiratory infection, unspecified: Secondary | ICD-10-CM | POA: Diagnosis not present

## 2022-01-06 DIAGNOSIS — Z20822 Contact with and (suspected) exposure to covid-19: Secondary | ICD-10-CM | POA: Diagnosis not present

## 2022-01-06 DIAGNOSIS — D649 Anemia, unspecified: Secondary | ICD-10-CM | POA: Diagnosis not present

## 2022-01-06 DIAGNOSIS — R059 Cough, unspecified: Secondary | ICD-10-CM | POA: Diagnosis not present

## 2022-01-06 DIAGNOSIS — Z6827 Body mass index (BMI) 27.0-27.9, adult: Secondary | ICD-10-CM | POA: Diagnosis not present

## 2022-01-06 DIAGNOSIS — R042 Hemoptysis: Secondary | ICD-10-CM | POA: Diagnosis not present

## 2022-01-07 DIAGNOSIS — R195 Other fecal abnormalities: Secondary | ICD-10-CM | POA: Diagnosis not present

## 2022-01-07 DIAGNOSIS — Z7901 Long term (current) use of anticoagulants: Secondary | ICD-10-CM | POA: Diagnosis not present

## 2022-01-07 DIAGNOSIS — K5900 Constipation, unspecified: Secondary | ICD-10-CM | POA: Diagnosis not present

## 2022-01-07 DIAGNOSIS — D500 Iron deficiency anemia secondary to blood loss (chronic): Secondary | ICD-10-CM | POA: Diagnosis not present

## 2022-01-07 DIAGNOSIS — Z791 Long term (current) use of non-steroidal anti-inflammatories (NSAID): Secondary | ICD-10-CM | POA: Diagnosis not present

## 2022-01-21 DIAGNOSIS — K635 Polyp of colon: Secondary | ICD-10-CM | POA: Insufficient documentation

## 2022-01-21 DIAGNOSIS — E785 Hyperlipidemia, unspecified: Secondary | ICD-10-CM | POA: Insufficient documentation

## 2022-01-21 HISTORY — DX: Hyperlipidemia, unspecified: E78.5

## 2022-01-21 HISTORY — DX: Polyp of colon: K63.5

## 2022-01-23 ENCOUNTER — Ambulatory Visit (INDEPENDENT_AMBULATORY_CARE_PROVIDER_SITE_OTHER): Payer: Medicare PPO | Admitting: Podiatry

## 2022-01-23 DIAGNOSIS — Z01812 Encounter for preprocedural laboratory examination: Secondary | ICD-10-CM | POA: Diagnosis not present

## 2022-01-23 DIAGNOSIS — I444 Left anterior fascicular block: Secondary | ICD-10-CM | POA: Diagnosis not present

## 2022-01-23 DIAGNOSIS — B351 Tinea unguium: Secondary | ICD-10-CM

## 2022-01-23 DIAGNOSIS — M79675 Pain in left toe(s): Secondary | ICD-10-CM | POA: Diagnosis not present

## 2022-01-23 DIAGNOSIS — E119 Type 2 diabetes mellitus without complications: Secondary | ICD-10-CM

## 2022-01-23 DIAGNOSIS — M79674 Pain in right toe(s): Secondary | ICD-10-CM | POA: Diagnosis not present

## 2022-01-23 DIAGNOSIS — Z01810 Encounter for preprocedural cardiovascular examination: Secondary | ICD-10-CM | POA: Diagnosis not present

## 2022-01-23 DIAGNOSIS — G4733 Obstructive sleep apnea (adult) (pediatric): Secondary | ICD-10-CM | POA: Diagnosis not present

## 2022-01-23 DIAGNOSIS — I493 Ventricular premature depolarization: Secondary | ICD-10-CM | POA: Diagnosis not present

## 2022-01-23 DIAGNOSIS — I739 Peripheral vascular disease, unspecified: Secondary | ICD-10-CM

## 2022-01-23 DIAGNOSIS — Z6829 Body mass index (BMI) 29.0-29.9, adult: Secondary | ICD-10-CM | POA: Diagnosis not present

## 2022-01-23 DIAGNOSIS — I4891 Unspecified atrial fibrillation: Secondary | ICD-10-CM | POA: Diagnosis not present

## 2022-01-23 NOTE — Progress Notes (Signed)
  Subjective:  Patient ID: Spencer Cochran, male    DOB: 1945-09-26,  MRN: [id number]  Chief Complaint  Patient presents with   Nail Problem    Routine Foot Care     76 y.o. male presents with the above complaint. History confirmed with patient. Patient presenting with pain related to dystrophic thickened elongated nails. Patient is unable to trim own nails related to nail dystrophy and/or mobility issues. Patient does not have a history of T2DM.   Objective:  Physical Exam: warm, good capillary refill nail exam onychomycosis of the toenails, onycholysis, and dystrophic nails DP pulses palpable, PT pulses palpable, and protective sensation intact Left Foot:  Pain with palpation of nails due to elongation and dystrophic growth.  Right Foot: Pain with palpation of nails due to elongation and dystrophic growth.   Assessment:   1. Pain due to onychomycosis of toenails of both feet   2. Diabetes mellitus without complication ([place])   3. PVD (peripheral vascular disease) ([place])      Plan:  Patient was evaluated and treated and all questions answered.    #Onychomycosis with pain  -Nails palliatively debrided as below. -Educated on self-care  Procedure: Nail Debridement Rationale: Pain Type of Debridement: manual, sharp debridement. Instrumentation: Nail nipper, rotary burr. Number of Nails: 10  Return in about 3 months (around 04/24/2022) for RFC.         Everitt Amber, DPM [facility]

## 2022-01-24 DIAGNOSIS — I4891 Unspecified atrial fibrillation: Secondary | ICD-10-CM | POA: Diagnosis not present

## 2022-01-24 DIAGNOSIS — I493 Ventricular premature depolarization: Secondary | ICD-10-CM | POA: Diagnosis not present

## 2022-01-24 DIAGNOSIS — I444 Left anterior fascicular block: Secondary | ICD-10-CM | POA: Diagnosis not present

## 2022-01-27 DIAGNOSIS — G4733 Obstructive sleep apnea (adult) (pediatric): Secondary | ICD-10-CM | POA: Diagnosis not present

## 2022-02-02 DIAGNOSIS — E782 Mixed hyperlipidemia: Secondary | ICD-10-CM | POA: Diagnosis not present

## 2022-02-02 DIAGNOSIS — I482 Chronic atrial fibrillation, unspecified: Secondary | ICD-10-CM | POA: Diagnosis not present

## 2022-02-13 DIAGNOSIS — I110 Hypertensive heart disease with heart failure: Secondary | ICD-10-CM | POA: Diagnosis not present

## 2022-02-13 DIAGNOSIS — I2510 Atherosclerotic heart disease of native coronary artery without angina pectoris: Secondary | ICD-10-CM | POA: Diagnosis not present

## 2022-02-13 DIAGNOSIS — I517 Cardiomegaly: Secondary | ICD-10-CM | POA: Diagnosis not present

## 2022-02-13 DIAGNOSIS — Z6827 Body mass index (BMI) 27.0-27.9, adult: Secondary | ICD-10-CM | POA: Diagnosis not present

## 2022-02-13 DIAGNOSIS — Z9989 Dependence on other enabling machines and devices: Secondary | ICD-10-CM | POA: Diagnosis not present

## 2022-02-13 DIAGNOSIS — G4733 Obstructive sleep apnea (adult) (pediatric): Secondary | ICD-10-CM | POA: Diagnosis not present

## 2022-02-13 DIAGNOSIS — I4821 Permanent atrial fibrillation: Secondary | ICD-10-CM | POA: Diagnosis not present

## 2022-02-13 DIAGNOSIS — E785 Hyperlipidemia, unspecified: Secondary | ICD-10-CM | POA: Diagnosis not present

## 2022-02-13 DIAGNOSIS — I509 Heart failure, unspecified: Secondary | ICD-10-CM | POA: Diagnosis not present

## 2022-02-13 DIAGNOSIS — I771 Stricture of artery: Secondary | ICD-10-CM | POA: Diagnosis not present

## 2022-02-13 DIAGNOSIS — Z969 Presence of functional implant, unspecified: Secondary | ICD-10-CM | POA: Diagnosis not present

## 2022-02-13 DIAGNOSIS — J984 Other disorders of lung: Secondary | ICD-10-CM | POA: Diagnosis not present

## 2022-02-23 DIAGNOSIS — G4733 Obstructive sleep apnea (adult) (pediatric): Secondary | ICD-10-CM | POA: Diagnosis not present

## 2022-03-02 ENCOUNTER — Telehealth: Payer: Self-pay

## 2022-03-02 DIAGNOSIS — D509 Iron deficiency anemia, unspecified: Secondary | ICD-10-CM

## 2022-03-02 DIAGNOSIS — R195 Other fecal abnormalities: Secondary | ICD-10-CM | POA: Diagnosis not present

## 2022-03-02 HISTORY — DX: Other fecal abnormalities: R19.5

## 2022-03-02 HISTORY — DX: Iron deficiency anemia, unspecified: D50.9

## 2022-03-02 NOTE — Telephone Encounter (Signed)
..  Pre-operative Risk Assessment    Patient Name: Spencer Cochran  DOB: 06-Aug-1945 MRN: [id number]      Request for Surgical Clearance    Procedure:   colonoscopy and endoscopy  Date of Surgery:  Clearance TBD                                 Surgeon:  DR Jerel Shepherd Surgeon's Group or Practice Name:  [facility] Phone number:  [phone] Fax number:  [phone]   Type of Clearance Requested:   - Medical  - Pharmacy:  Hold Apixaban (Eliquis)     Type of Anesthesia:   PROPOFOL   Additional requests/questions:    [name]   03/02/2022, 3:01 PM

## 2022-03-03 NOTE — Telephone Encounter (Signed)
Primary Cardiologist:Robert Agustin Cree, MD   Preoperative team, please contact this patient and set up a phone call appointment for further preoperative risk assessment. Please obtain consent and complete medication review. Thank you for your help.   I confirm that guidance regarding antiplatelet and oral anticoagulation therapy has been completed and, if necessary, noted below.   Emmaline Life, NP-C  03/03/2022, 10:59 AM [address] Office [phone] Fax [phone]

## 2022-03-03 NOTE — Telephone Encounter (Signed)
Patient with diagnosis of A Fib on Eliquis for anticoagulation.    Procedure: colonoscopy and endoscopy Date of procedure: TBD   CHA2DS2-VASc Score = 5  This indicates a 7.2% annual risk of stroke. The patient's score is based upon: CHF History: 1 HTN History: 1 Diabetes History: 0 Stroke History: 0 Vascular Disease History: 1 Age Score: 2 Gender Score: 0   CrCl 79 mL/min Platelet count 209K  Per office protocol, patient can hold Eliquis for 2 days prior to procedure.    **This guidance is not considered finalized until pre-operative APP has relayed final recommendations.**

## 2022-03-03 NOTE — Telephone Encounter (Signed)
Pt has appt with Dr. Agustin Cree 03/09/22. I have added need pre op clearance to appt notes. Will update the surgeon office pt has appt.

## 2022-03-05 DIAGNOSIS — N200 Calculus of kidney: Secondary | ICD-10-CM | POA: Diagnosis not present

## 2022-03-05 DIAGNOSIS — N529 Male erectile dysfunction, unspecified: Secondary | ICD-10-CM | POA: Diagnosis not present

## 2022-03-05 DIAGNOSIS — N401 Enlarged prostate with lower urinary tract symptoms: Secondary | ICD-10-CM | POA: Diagnosis not present

## 2022-03-05 DIAGNOSIS — N390 Urinary tract infection, site not specified: Secondary | ICD-10-CM | POA: Diagnosis not present

## 2022-03-09 ENCOUNTER — Ambulatory Visit: Payer: Medicare PPO | Attending: Cardiology | Admitting: Cardiology

## 2022-03-09 ENCOUNTER — Encounter: Payer: Self-pay | Admitting: Cardiology

## 2022-03-09 VITALS — BP 126/76 | HR 92 | Ht 68.0 in | Wt 188.0 lb

## 2022-03-09 DIAGNOSIS — E785 Hyperlipidemia, unspecified: Secondary | ICD-10-CM

## 2022-03-09 DIAGNOSIS — I519 Heart disease, unspecified: Secondary | ICD-10-CM | POA: Diagnosis not present

## 2022-03-09 DIAGNOSIS — I4821 Permanent atrial fibrillation: Secondary | ICD-10-CM | POA: Diagnosis not present

## 2022-03-09 DIAGNOSIS — R0602 Shortness of breath: Secondary | ICD-10-CM

## 2022-03-09 DIAGNOSIS — G4733 Obstructive sleep apnea (adult) (pediatric): Secondary | ICD-10-CM | POA: Diagnosis not present

## 2022-03-09 DIAGNOSIS — I2510 Atherosclerotic heart disease of native coronary artery without angina pectoris: Secondary | ICD-10-CM | POA: Diagnosis not present

## 2022-03-09 NOTE — Progress Notes (Signed)
Cardiology Office Note:    Date:  03/09/2022   ID:  Spencer Cochran, DOB [birth year], MRN [id number]  PCP:  Maryella Shivers, MD  Cardiologist:  Jenne Campus, MD    Referring MD: Maryella Shivers, MD   Chief Complaint  Patient presents with   Follow-up  Doing fine  History of Present Illness:    Spencer Cochran is a 77 y.o. male with past medical history significant for coronary artery disease, luminal disease based on cardiac catheterization done years ago recent stress test negative, permanent atrial fibrillation, anticoagulated, diastolic dysfunction, obstructive sleep apnea.  He comes today to months for follow-up.  He is doing very well.  Recently he did have Inspira device implanted.  And he is very enthusiastic about potential opportunity to get rid of CPAP mask.  Does not go to gym right now because of recovery after procedure.  Until then he was going on the regular basis with no difficulties.  Denies have any cardiac complaints.  There is no chest pain tightness squeezing pressure burning chest.  Past Medical History:  Diagnosis Date   Arthritis knees and ankle   Asymptomatic gallstones    Atrial fibrillation ([place]) 03/08/2014   Benign neoplasm of colon 06/13/2020   Benign prostatic hypertrophy with incomplete bladder emptying 04/20/2013   Biliary obstruction    Cancer (HCC)    skin    Cataract immature BILATERAL EYES   Choledocholithiasis    Coronary artery disease CARDIOLOGIST-  DR Agustin Cree  Tia Alert)---  LAST VISIT 04-29-2011   DENIES CARDIAC SYMPTOMS   Coronary artery disease involving native coronary artery of native heart without angina pectoris 10/09/2014   Luminal disease but cardiac catheterization from 2014, stress test in the summer of 2018 showed no evidence of ischemia  Formatting of this note might be different from the original. 20% circumflex 20% RCA in July 2014   COVID-19 virus infection 02/2020   DDD (degenerative disc disease), lumbar    Dyslipidemia  10/01/2016   Dysrhythmia    A Fib    Endocarditis 06/13/2020   Apr 25, 2007 Entered By: Mahlon Gammon C Comment: AORTIC STENOSIS, MITRAL AND TRICUSPID REGURGITATION   Frequency of urination    Gait abnormality 08/03/2018   GERD (gastroesophageal reflux disease)    Glaucoma    both eyes   History of colon polyps    History of kidney stones    Hypertension    Impaired hearing has bilateral aids--  but does not wear at all times   Kidney stone 06/13/2020   Apr 25, 2007 Entered By: Mahlon Gammon C Comment: TREATED WITH LITHOTRIPSY   Left ventricular diastolic dysfunction PER ECHO 03-31-2011  W/ CHART   Low back pain, unspecified 03/11/2021   Nocturia    Obstructive jaundice    Obstructive sleep apnea 06/05/2019   OSA on CPAP    wears cpap   Palpitations 08/10/2019   Paresthesia 07/18/2018   Pre-diabetes    last hemaglobin a 1 c was 5.6   Unsteadiness    Urethral stricture     Past Surgical History:  Procedure Laterality Date   APPENDECTOMY  1972   BALLOON DILATION N/A 04/13/2019   Procedure: Larrie Kass DILATION;  Surgeon: Jackquline Denmark, MD;  Location: [facility];  Service: Endoscopy;  Laterality: N/A;   CARDIOVASCULAR STRESS TEST  04-29-2010   DR KRASAWSKI   NO EVIDENCE ISCHEMIA/ NORMAL LVSF AND WALL MOTION/ EF 63%   CERVICAL FUSION  2006   C3 - 6   COLONOSCOPY  03/23/2012  Small colonic polyps, status post polypectomy. Pancolonic diverticulosis predominantly in the left colon.Small internal and external hemorrhoids   COLONOSCOPY  2019   Dr Orlena Sheldon. Diverticulosis. Doesn't think he removed polpys    CYSTO/ URETHRAL DILATION/ TRANSURETHRAL INCISIONOF PROSTATE  01-13-2007   CYSTOSCOPY  2005   CYSTOSCOPY W/ RETROGRADES  06/12/2011   Procedure: CYSTOSCOPY WITH RETROGRADE PYELOGRAM;  Surgeon: Ailene Rud, MD;  Location: [facility];  Service: Urology;  Laterality: N/A;  cysto, urethral dilation, right retrograde pyelogram     CYSTOSCOPY WITH RETROGRADE PYELOGRAM,  URETEROSCOPY AND STENT PLACEMENT Left 03/08/2014   Procedure: CYSTOSCOPY WITH LEFT RETROGRADE PYELOGRAM/LEFT  URETEROSCOPY;  Surgeon: Ailene Rud, MD;  Location: [facility];  Service: Urology;  Laterality: Left;   CYSTOSCOPY WITH URETHRAL DILATATION N/A 04/20/2013   Procedure: CYSTOSCOPY WITH URETHRAL DILATATION;  Surgeon: Ailene Rud, MD;  Location: [facility];  Service: Urology;  Laterality: N/A;   CYSTOSCOPY/URETEROSCOPY/HOLMIUM LASER/STENT PLACEMENT Left 06/10/2017   Procedure: CYSTOSCOPY/RETROGRADE/URETEROSCOPY/HOLMIUM LASER/STENT PLACEMENT;  Surgeon: Raynelle Bring, MD;  Location: [facility];  Service: Urology;  Laterality: Left;   CYSTOSCOPY/URETEROSCOPY/HOLMIUM LASER/STENT PLACEMENT Right 03/09/2019   Procedure: CYSTOSCOPY/RETROGRADE/URETEROSCOPY/HOLMIUM LASER/STENT PLACEMENT;  Surgeon: Raynelle Bring, MD;  Location: [facility];  Service: Urology;  Laterality: Right;  ONLY NEEDS 60 MIN   ENDOSCOPIC RETROGRADE CHOLANGIOPANCREATOGRAPHY (ERCP) WITH PROPOFOL N/A 04/13/2019   Procedure: ENDOSCOPIC RETROGRADE CHOLANGIOPANCREATOGRAPHY (ERCP) WITH PROPOFOL;  Surgeon: Jackquline Denmark, MD;  [facility];  Service: Endoscopy;  Laterality: N/A;   EXTRACORPOREAL SHOCK WAVE LITHOTRIPSY  01-17-2007   LEFT   HERNIA REPAIR  01/30/2019   [facility] Left inguinal hernia repair   HOLMIUM LASER APPLICATION Left [id number]   Procedure: LASER OF LEFT RENAL PELVIC STONE;  Surgeon: Ailene Rud, MD;  Location: [facility];  Service: Urology;  Laterality: Left;   INGUINAL HERNIA REPAIR  2003   JOINT REPLACEMENT     total knee right 03-01-17   LEFT ACHILLES TENDON REPAIR  1992   MASS EXCISION N/A 11/14/2018   Procedure: EXCISIONAL BIOPSY OF GLANS PENIS;  Surgeon: Raynelle Bring, MD;  Location: [facility];  Service: Urology;  Laterality: N/A;  ONLY NEEDS 60 MIN   NASAL SINUS SURGERY  2012   PENILE SURGERY  OF MEATUS  1955   REMOVAL OF STONES  04/13/2019   Procedure: REMOVAL OF STONES;   Surgeon: Jackquline Denmark, MD;  Location: [facility];  Service: Endoscopy;;   SPHINCTEROTOMY  04/13/2019   Procedure: [name];  Surgeon: Jackquline Denmark, MD;  Location: [facility];  Service: Endoscopy;;   TRANSTHORACIC ECHOCARDIOGRAM  03-31-2011   NORMAL LVEF  59%/ TRIVIAL MR/ DIASTOLIC DYSFUNCTION/ MODERATE LVH   TRANSURETHRAL RESECTION OF PROSTATE N/A 04/20/2013   Procedure: TRANSURETHRAL RESECTION OF THE PROSTATE WITH GYRUS INSTRUMENTS;  Surgeon: Ailene Rud, MD;  Location: [facility];  Service: Urology;  Laterality: N/A;    Current Medications: Current Meds  Medication Sig   apixaban (ELIQUIS) 5 MG TABS tablet Take 1 tablet (5 mg total) by mouth 2 (two) times daily.   atorvastatin (LIPITOR) 20 MG tablet Take 1 tablet (20 mg total) by mouth at bedtime. Taking 20 MG Daily   benazepril (LOTENSIN) 20 MG tablet Take 1 tablet (20 mg total) by mouth daily.   carvedilol (COREG) 12.5 MG tablet Take 1 tablet (12.5 mg total) by mouth 2 (two) times daily.   ferrous sulfate 325 (65 FE) MG tablet Take 325 mg by mouth daily.   furosemide (LASIX) 40 MG tablet Take 1  tablet (40 mg total) by mouth daily.   omeprazole (PRILOSEC) 20 MG capsule Take 20 mg by mouth daily.    timolol (TIMOPTIC) 0.5 % ophthalmic solution Place 1 drop into both eyes 2 (two) times daily.    [DISCONTINUED] NEOMYCIN-POLYMYXIN-HYDROCORTISONE (CORTISPORIN) 1 % SOLN OTIC solution Place 3 drops into both ears every 6 (six) hours.   [DISCONTINUED] nitroGLYCERIN (NITROSTAT) 0.4 MG SL tablet Place 1 tablet (0.4 mg total) under the tongue every 5 (five) minutes x 3 doses as needed for chest pain.   [DISCONTINUED] Omega-3 Fatty Acids (OMEGA 3 PO) Take 520 mg by mouth daily.   [DISCONTINUED] potassium chloride (KLOR-CON) 10 MEQ tablet Take 1 tablet (10 mEq total) by mouth daily.   [DISCONTINUED] Probiotic Product (PROBIOTIC DAILY PO) Take 1 tablet by mouth daily. Unknown strength   [DISCONTINUED] traZODone (DESYREL) 50 MG tablet  Take 75 mg by mouth at bedtime.   [DISCONTINUED] triamcinolone cream (KENALOG) 0.1 % Apply 1 application topically daily as needed (skin irritation).      Allergies:   Hydrocodone   Social History   Socioeconomic History   Marital status: Married    Spouse name: Not on file   Number of children: 2   Years of education: some college   Highest education level: Not on file  Occupational History   Occupation: retired Social research officer, government  Tobacco Use   Smoking status: Never   Smokeless tobacco: Never  Vaping Use   Vaping Use: Never used  Substance and Sexual Activity   Alcohol use: No   Drug use: No   Sexual activity: Yes  Other Topics Concern   Not on file  Social History Narrative   Lives at home with his wife.   Right-handed.   Caffeine use:  32-40 ounces caffeine per day.   Social Determinants of Health   Financial Resource Strain: Not on file  Food Insecurity: Not on file  Transportation Needs: Not on file  Physical Activity: Not on file  Stress: Not on file  Social Connections: Not on file     Family History: The patient's family history includes Breast cancer in his mother; Cirrhosis in his father; Colon cancer in his maternal aunt. There is no history of Esophageal cancer. ROS:   Please see the history of present illness.    All 14 point review of systems negative except as described per history of present illness  EKGs/Labs/Other Studies Reviewed:      Recent Labs: 08/28/2021: BUN 24; Creatinine, Ser 0.94; Potassium 3.9; Sodium 146  Recent Lipid Panel No results found for: "CHOL", "TRIG", "HDL", "CHOLHDL", "VLDL", "LDLCALC", "LDLDIRECT"  Physical Exam:    VS:  BP 126/76 (BP Location: Left Arm, Patient Position: Sitting)   Pulse 92   Ht 5' 8"$  (1.727 m)   Wt 188 lb (85.3 kg)   SpO2 96%   BMI 28.59 kg/m     Wt Readings from Last 3 Encounters:  03/09/22 188 lb (85.3 kg)  08/15/21 187 lb 6.4 oz (85 kg)  03/12/21 187 lb 6.4 oz (85 kg)     GEN:  Well  nourished, well developed in no acute distress HEENT: Normal NECK: No JVD; No carotid bruits LYMPHATICS: No lymphadenopathy CARDIAC: Irregularly irregular, no murmurs, no rubs, no gallops RESPIRATORY:  Clear to auscultation without rales, wheezing or rhonchi  ABDOMEN: Soft, non-tender, non-distended MUSCULOSKELETAL:  No edema; No deformity  SKIN: Warm and dry LOWER EXTREMITIES: no swelling NEUROLOGIC:  Alert and oriented x 3 PSYCHIATRIC:  Normal affect  ASSESSMENT:    1. Shortness of breath   2. Coronary artery disease involving native coronary artery of native heart without angina pectoris   3. Permanent atrial fibrillation ([place])   4. Left ventricular diastolic dysfunction   5. OSA on CPAP   6. Dyslipidemia    PLAN:    In order of problems listed above:  Coronary disease stable from that point to be on appropriate medications which I will continue. Permanent atrial fibrillation, rate controlled, he is anticoag with Eliquis 5 mg twice daily which I will continue. Dyslipidemia I did review his K PN which show me his LDL of 67 HDL 46.  He is on Lipitor 20 which is moderate intense statin which I will continue. Obstructive sleep apnea status post recent Inspira implantation.    I was chaperoned by my nurses assistant Jerl Santos during entire visit   Medication Adjustments/Labs and Tests Ordered: Current medicines are reviewed at length with the patient today.  Concerns regarding medicines are outlined above.  Orders Placed This Encounter  Procedures   EKG 12-Lead   ECHOCARDIOGRAM COMPLETE   Medication changes: No orders of the defined types were placed in this encounter.   Signed, Park Liter, MD, [facility] 03/09/2022 4:59 PM    [facility]

## 2022-03-09 NOTE — Patient Instructions (Addendum)

## 2022-03-16 DIAGNOSIS — L570 Actinic keratosis: Secondary | ICD-10-CM | POA: Diagnosis not present

## 2022-03-16 DIAGNOSIS — L821 Other seborrheic keratosis: Secondary | ICD-10-CM | POA: Diagnosis not present

## 2022-03-16 DIAGNOSIS — L578 Other skin changes due to chronic exposure to nonionizing radiation: Secondary | ICD-10-CM | POA: Diagnosis not present

## 2022-03-19 ENCOUNTER — Other Ambulatory Visit: Payer: Self-pay | Admitting: Cardiology

## 2022-03-19 DIAGNOSIS — E785 Hyperlipidemia, unspecified: Secondary | ICD-10-CM | POA: Diagnosis not present

## 2022-03-19 DIAGNOSIS — K219 Gastro-esophageal reflux disease without esophagitis: Secondary | ICD-10-CM | POA: Diagnosis not present

## 2022-03-19 DIAGNOSIS — Z7901 Long term (current) use of anticoagulants: Secondary | ICD-10-CM | POA: Diagnosis not present

## 2022-03-19 DIAGNOSIS — I4891 Unspecified atrial fibrillation: Secondary | ICD-10-CM | POA: Diagnosis not present

## 2022-03-19 DIAGNOSIS — K5730 Diverticulosis of large intestine without perforation or abscess without bleeding: Secondary | ICD-10-CM | POA: Diagnosis not present

## 2022-03-19 DIAGNOSIS — I10 Essential (primary) hypertension: Secondary | ICD-10-CM | POA: Diagnosis not present

## 2022-03-19 DIAGNOSIS — D649 Anemia, unspecified: Secondary | ICD-10-CM | POA: Diagnosis not present

## 2022-03-19 DIAGNOSIS — I110 Hypertensive heart disease with heart failure: Secondary | ICD-10-CM | POA: Diagnosis not present

## 2022-03-19 DIAGNOSIS — R195 Other fecal abnormalities: Secondary | ICD-10-CM | POA: Diagnosis not present

## 2022-03-19 DIAGNOSIS — D509 Iron deficiency anemia, unspecified: Secondary | ICD-10-CM | POA: Diagnosis not present

## 2022-03-19 DIAGNOSIS — Z79899 Other long term (current) drug therapy: Secondary | ICD-10-CM | POA: Diagnosis not present

## 2022-03-19 DIAGNOSIS — K449 Diaphragmatic hernia without obstruction or gangrene: Secondary | ICD-10-CM | POA: Diagnosis not present

## 2022-03-31 DIAGNOSIS — Z9682 Presence of neurostimulator: Secondary | ICD-10-CM | POA: Diagnosis not present

## 2022-03-31 DIAGNOSIS — G4733 Obstructive sleep apnea (adult) (pediatric): Secondary | ICD-10-CM | POA: Diagnosis not present

## 2022-04-03 ENCOUNTER — Other Ambulatory Visit: Payer: Self-pay | Admitting: Cardiology

## 2022-04-03 DIAGNOSIS — I482 Chronic atrial fibrillation, unspecified: Secondary | ICD-10-CM | POA: Diagnosis not present

## 2022-04-03 DIAGNOSIS — E782 Mixed hyperlipidemia: Secondary | ICD-10-CM | POA: Diagnosis not present

## 2022-04-11 ENCOUNTER — Other Ambulatory Visit: Payer: Self-pay | Admitting: Cardiology

## 2022-04-13 ENCOUNTER — Telehealth: Payer: Self-pay | Admitting: Cardiology

## 2022-04-13 NOTE — Telephone Encounter (Signed)
  Pt c/o medication issue:  1. Name of Medication:   benazepril (LOTENSIN) 20 MG tablet    2. How are you currently taking this medication (dosage and times per day)?   Take 1 tablet (20 mg total) by mouth daily.    3. Are you having a reaction (difficulty breathing--STAT)? No   4. What is your medication issue? Pt said, per pharmacy they need prior auth to fill this medication

## 2022-04-13 NOTE — Telephone Encounter (Signed)
Spoke with [facility] regarding Benazepril. They stated that they received the request and it has been filled. Pt notified.

## 2022-04-22 DIAGNOSIS — G4733 Obstructive sleep apnea (adult) (pediatric): Secondary | ICD-10-CM | POA: Diagnosis not present

## 2022-04-22 DIAGNOSIS — Z9682 Presence of neurostimulator: Secondary | ICD-10-CM | POA: Diagnosis not present

## 2022-04-28 ENCOUNTER — Ambulatory Visit: Payer: Medicare PPO | Admitting: Podiatry

## 2022-05-04 ENCOUNTER — Ambulatory Visit (INDEPENDENT_AMBULATORY_CARE_PROVIDER_SITE_OTHER): Payer: Medicare PPO | Admitting: Podiatry

## 2022-05-04 DIAGNOSIS — M79674 Pain in right toe(s): Secondary | ICD-10-CM

## 2022-05-04 DIAGNOSIS — M79675 Pain in left toe(s): Secondary | ICD-10-CM

## 2022-05-04 DIAGNOSIS — B351 Tinea unguium: Secondary | ICD-10-CM | POA: Diagnosis not present

## 2022-05-04 DIAGNOSIS — E119 Type 2 diabetes mellitus without complications: Secondary | ICD-10-CM

## 2022-05-04 DIAGNOSIS — I739 Peripheral vascular disease, unspecified: Secondary | ICD-10-CM

## 2022-05-04 NOTE — Progress Notes (Signed)
  Subjective:  Patient ID: Anguel Leamy, male    DOB: [birth year],  MRN: [id number]  Chief Complaint  Patient presents with   Nail Problem    Routine foot care, nail trim     77 y.o. male presents with the above complaint. History confirmed with patient. Patient presenting with pain related to dystrophic thickened elongated nails. Patient is unable to trim own nails related to nail dystrophy and/or mobility issues. Patient does not have a history of T2DM.   Objective:  Physical Exam: warm, good capillary refill nail exam onychomycosis of the toenails, onycholysis, and dystrophic nails DP pulses palpable, PT pulses palpable, and protective sensation intact Left Foot:  Pain with palpation of nails due to elongation and dystrophic growth.  Right Foot: Pain with palpation of nails due to elongation and dystrophic growth.   Assessment:   1. Pain due to onychomycosis of toenails of both feet   2. Diabetes mellitus without complication   3. PVD (peripheral vascular disease)       Plan:  Patient was evaluated and treated and all questions answered.    #Onychomycosis with pain  -Nails palliatively debrided as below. -Educated on self-care  Procedure: Nail Debridement Rationale: Pain Type of Debridement: manual, sharp debridement. Instrumentation: Nail nipper, rotary burr. Number of Nails: 10  Return in about 3 months (around 08/03/2022) for [facility].         Everitt Amber, DPM [facility]

## 2022-05-11 ENCOUNTER — Ambulatory Visit: Payer: Medicare PPO

## 2022-05-12 ENCOUNTER — Telehealth: Payer: Self-pay | Admitting: *Deleted

## 2022-05-12 DIAGNOSIS — M19012 Primary osteoarthritis, left shoulder: Secondary | ICD-10-CM | POA: Diagnosis not present

## 2022-05-12 NOTE — Telephone Encounter (Signed)
   Pre-operative Risk Assessment    Patient Name: Spencer Cochran  DOB: Nov 10, 1945 MRN: [id number]      Request for Surgical Clearance    Procedure:   Total Shoulder Replacement  Date of Surgery:  Clearance TBD                                 Surgeon:  Linda Hedges, MD Surgeon's Group or Practice Name:  [facility] Phone number:  [phone] Fax number:  [phone]   Type of Clearance Requested:   - Pharmacy:  Hold Apixaban (Eliquis)     Type of Anesthesia:  General    Additional requests/questions:    [name]   05/12/2022, 3:52 PM

## 2022-05-13 ENCOUNTER — Telehealth: Payer: Self-pay | Admitting: *Deleted

## 2022-05-13 NOTE — Telephone Encounter (Signed)
Pt has been scheduled for tele pre op appt 05/28/22 @ 9 am. Med rec and consent are done.

## 2022-05-13 NOTE — Telephone Encounter (Signed)
Patient with diagnosis of A Fib on Eliquis for anticoagulation.     Procedure: total shoulder replacement  Date of procedure: TBD     CHA2DS2-VASc Score = 5  This indicates a 7.2% annual risk of stroke. The patient's score is based upon: CHF History: 1 HTN History: 1 Diabetes History: 0 Stroke History: 0 Vascular Disease History: 1 Age Score: 2 Gender Score: 0     CrCl 79 mL/min Platelet count 209K   Per office protocol, patient can hold Eliquis for 3 days prior to procedure.     **This guidance is not considered finalized until pre-operative APP has relayed final recommendations.**

## 2022-05-13 NOTE — Telephone Encounter (Signed)
   Name: Spencer Cochran  DOB: 1945/05/23  MRN: [id number]  Primary Cardiologist: Gypsy Balsam, MD   Preoperative team, please contact this patient and set up a phone call appointment for further preoperative risk assessment. Please obtain consent and complete medication review. Thank you for your help.  I confirm that guidance regarding antiplatelet and oral anticoagulation therapy has been completed and, if necessary, noted below.  Per Pharm D: Patient with diagnosis of A Fib on Eliquis for anticoagulation.     Procedure: total shoulder replacement  Date of procedure: TBD     CHA2DS2-VASc Score = 5  This indicates a 7.2% annual risk of stroke. The patient's score is based upon: CHF History: 1 HTN History: 1 Diabetes History: 0 Stroke History: 0 Vascular Disease History: 1 Age Score: 2 Gender Score: 0     CrCl 79 mL/min Platelet count 209K   Per office protocol, patient can hold Eliquis for 3 days prior to procedure.     Carlos Levering, NP 05/13/2022, 10:09 AM [facility]

## 2022-05-13 NOTE — Telephone Encounter (Signed)
Pt has been scheduled for tele pre op appt 05/28/22 @ 9 am. Med rec and consent are done.     Patient Consent for Virtual Visit        Spencer Cochran has provided verbal consent on 05/13/2022 for a virtual visit (video or telephone).   CONSENT FOR VIRTUAL VISIT FOR:  Spencer Cochran  By participating in this virtual visit I agree to the following:  I hereby voluntarily request, consent and authorize [facility] and its employed or contracted physicians, physician assistants, nurse practitioners or other licensed health care professionals (the Practitioner), to provide me with telemedicine health care services (the "Services") as deemed necessary by the treating Practitioner. I acknowledge and consent to receive the Services by the Practitioner via telemedicine. I understand that the telemedicine visit will involve communicating with the Practitioner through live audiovisual communication technology and the disclosure of certain medical information by electronic transmission. I acknowledge that I have been given the opportunity to request an in-person assessment or other available alternative prior to the telemedicine visit and am voluntarily participating in the telemedicine visit.  I understand that I have the right to withhold or withdraw my consent to the use of telemedicine in the course of my care at any time, without affecting my right to future care or treatment, and that the Practitioner or I may terminate the telemedicine visit at any time. I understand that I have the right to inspect all information obtained and/or recorded in the course of the telemedicine visit and may receive copies of available information for a reasonable fee.  I understand that some of the potential risks of receiving the Services via telemedicine include:  Delay or interruption in medical evaluation due to technological equipment failure or disruption; Information transmitted may not be sufficient (e.g.  poor resolution of images) to allow for appropriate medical decision making by the Practitioner; and/or  In rare instances, security protocols could fail, causing a breach of personal health information.  Furthermore, I acknowledge that it is my responsibility to provide information about my medical history, conditions and care that is complete and accurate to the best of my ability. I acknowledge that Practitioner's advice, recommendations, and/or decision may be based on factors not within their control, such as incomplete or inaccurate data provided by me or distortions of diagnostic images or specimens that may result from electronic transmissions. I understand that the practice of medicine is not an exact science and that Practitioner makes no warranties or guarantees regarding treatment outcomes. I acknowledge that a copy of this consent can be made available to me via my patient portal [facility] MyChart), or I can request a printed copy by calling the office of [facility].    I understand that my insurance will be billed for this visit.   I have read or had this consent read to me. I understand the contents of this consent, which adequately explains the benefits and risks of the Services being provided via telemedicine.  I have been provided ample opportunity to ask questions regarding this consent and the Services and have had my questions answered to my satisfaction. I give my informed consent for the services to be provided through the use of telemedicine in my medical care

## 2022-05-21 DIAGNOSIS — R7303 Prediabetes: Secondary | ICD-10-CM | POA: Diagnosis not present

## 2022-05-21 DIAGNOSIS — D649 Anemia, unspecified: Secondary | ICD-10-CM | POA: Diagnosis not present

## 2022-05-21 DIAGNOSIS — E782 Mixed hyperlipidemia: Secondary | ICD-10-CM | POA: Diagnosis not present

## 2022-05-22 DIAGNOSIS — M19012 Primary osteoarthritis, left shoulder: Secondary | ICD-10-CM | POA: Diagnosis not present

## 2022-05-25 ENCOUNTER — Ambulatory Visit: Payer: Medicare PPO | Attending: Cardiology

## 2022-05-25 DIAGNOSIS — R0602 Shortness of breath: Secondary | ICD-10-CM

## 2022-05-25 LAB — ECHOCARDIOGRAM COMPLETE
AR max vel: 1.5 cm2
AV Area VTI: 1.7 cm2
AV Area mean vel: 1.54 cm2
AV Mean grad: 10 mmHg
AV Peak grad: 18.7 mmHg
Ao pk vel: 2.16 m/s
Area-P 1/2: 4.49 cm2
Calc EF: 52.9 %
MV M vel: 5.25 m/s
MV Peak grad: 110.3 mmHg
P 1/2 time: 745 msec
Radius: 0.7 cm
S' Lateral: 4.5 cm
Single Plane A2C EF: 48.8 %
Single Plane A4C EF: 56.5 %

## 2022-05-28 ENCOUNTER — Ambulatory Visit: Payer: Medicare PPO | Attending: Cardiology | Admitting: Nurse Practitioner

## 2022-05-28 ENCOUNTER — Telehealth: Payer: Self-pay

## 2022-05-28 DIAGNOSIS — Z01810 Encounter for preprocedural cardiovascular examination: Secondary | ICD-10-CM

## 2022-05-28 NOTE — Telephone Encounter (Signed)
Pre op APP Bernadene Person, NP has tried x 2 to reach the pt beginning at 9 am for tele appt though goes straight to vm. I then just called and went straight to vm . I left message that we have tried x 3 to reach him and that he will need to call back.

## 2022-05-28 NOTE — Telephone Encounter (Signed)
Left message to call back ASAP, just need to clarify they are needing cardiac clearance as well. Request was placed as only needing pharm clearance, though pt is having general anesthesia for his surgery. Pt has a tele appt today at 9 am.

## 2022-05-28 NOTE — Telephone Encounter (Signed)
   Patient Name: Spencer Cochran  DOB: May 16, 1945 MRN: [id number]  Primary Cardiologist: Gypsy Balsam, MD  Clinical pharmacists have reviewed the patient's past medical history, labs, and current medications as part of preoperative protocol coverage. The following recommendations have been made:  Patient with diagnosis of A Fib on Eliquis for anticoagulation.     Procedure: total shoulder replacement  Date of procedure: TBD     CHA2DS2-VASc Score = 5  This indicates a 7.2% annual risk of stroke. The patient's score is based upon: CHF History: 1 HTN History: 1 Diabetes History: 0 Stroke History: 0 Vascular Disease History: 1 Age Score: 2 Gender Score: 0     CrCl 79 mL/min Platelet count 209K   Per office protocol, patient can hold Eliquis for 3 days prior to procedure.  Please resume Eliquis as soon as possible postprocedure, at the discretion of the surgeon.   I will route this recommendation to the requesting party via Epic fax function and remove from pre-op pool.  Please call with questions.  Joylene Grapes, NP 05/28/2022, 8:14 AM

## 2022-05-28 NOTE — Progress Notes (Signed)
Virtual Visit via Telephone Note   Because of Spencer Cochran's co-morbid illnesses, he is at least at moderate risk for complications without adequate follow up.  This format is felt to be most appropriate for this patient at this time.  The patient did not have access to video technology/had technical difficulties with video requiring transitioning to audio format only (telephone).  All issues noted in this document were discussed and addressed.  No physical exam could be performed with this format.  Please refer to the patient's chart for his consent to telehealth for [facility].  Evaluation Performed:  Preoperative cardiovascular risk assessment _____________   Date:  05/28/2022   Patient ID:  Spencer Cochran, DOB October 05, 1945, MRN [id number] Patient Location:  Home Provider location:   Office  Primary Care Provider:  Charlott Rakes, MD Primary Cardiologist:  Gypsy Balsam, MD  Chief Complaint / Patient Profile   77 y.o. y/o male with a h/o CAD, permanent atrial fibrillation, hyperlipidemia, and OSA who is pending total shoulder replacement with Dr. Linda Hedges of [facility] and presents today for telephonic preoperative cardiovascular risk assessment.  History of Present Illness    Spencer Cochran is a 77 y.o. male who presents via audio/video conferencing for a telehealth visit today.  Pt was last seen in cardiology clinic on 03/09/2022 by Dr. Bing Matter.  At that time Spencer Cochran was doing well.  The patient is now pending procedure as outlined above. Since his last visit, he has done well from a cardiac standpoint.   He denies chest pain, palpitations, dyspnea, pnd, orthopnea, n, v, dizziness, syncope, edema, weight gain, or early satiety. All other systems reviewed and are otherwise negative except as noted above.   Past Medical History    Past Medical History:  Diagnosis Date   Arthritis knees and ankle   Asymptomatic  gallstones    Atrial fibrillation (HCC) 03/08/2014   Benign neoplasm of colon 06/13/2020   Benign prostatic hypertrophy with incomplete bladder emptying 04/20/2013   Biliary obstruction    Cancer (HCC)    skin    Cataract immature BILATERAL EYES   Choledocholithiasis    Coronary artery disease CARDIOLOGIST-  DR Bing Matter  Rosalita Levan)---  LAST VISIT 04-29-2011   DENIES CARDIAC SYMPTOMS   Coronary artery disease involving native coronary artery of native heart without angina pectoris 10/09/2014   Luminal disease but cardiac catheterization from 2014, stress test in the summer of 2018 showed no evidence of ischemia  Formatting of this note might be different from the original. 20% circumflex 20% RCA in July 2014   COVID-19 virus infection 02/2020   DDD (degenerative disc disease), lumbar    Dyslipidemia 10/01/2016   Dysrhythmia    A Fib    Endocarditis 06/13/2020   Apr 25, 2007 Entered By: Elissa Hefty C Comment: AORTIC STENOSIS, MITRAL AND TRICUSPID REGURGITATION   Frequency of urination    Gait abnormality 08/03/2018   GERD (gastroesophageal reflux disease)    Glaucoma    both eyes   History of colon polyps    History of kidney stones    Hypertension    Impaired hearing has bilateral aids--  but does not wear at all times   Kidney stone 06/13/2020   Apr 25, 2007 Entered By: Elissa Hefty C Comment: TREATED WITH LITHOTRIPSY   Left ventricular diastolic dysfunction PER ECHO [id number]  W/ CHART   Low back pain, unspecified 03/11/2021   Nocturia    Obstructive jaundice    Obstructive  sleep apnea 06/05/2019   OSA on CPAP    wears cpap   Palpitations 08/10/2019   Paresthesia 07/18/2018   Pre-diabetes    last hemaglobin a 1 c was 5.6   Unsteadiness    Urethral stricture    Past Surgical History:  Procedure Laterality Date   APPENDECTOMY  1972   BALLOON DILATION N/A 04/13/2019   Procedure: BALLOON DILATION;  Surgeon: Lynann Bologna, MD;  [facility];  Service: Endoscopy;  Laterality:  N/A;   CARDIOVASCULAR STRESS TEST  04-29-2010   DR KRASAWSKI   NO EVIDENCE ISCHEMIA/ NORMAL LVSF AND WALL MOTION/ EF 63%   CERVICAL FUSION  2006   C3 - 6   COLONOSCOPY  03/23/2012   Small colonic polyps, status post polypectomy. Pancolonic diverticulosis predominantly in the left colon.Small internal and external hemorrhoids   COLONOSCOPY  2019   Dr Rayfield Citizen. Diverticulosis. Doesn't think he removed polpys    CYSTO/ URETHRAL DILATION/ TRANSURETHRAL INCISIONOF PROSTATE  01-13-2007   CYSTOSCOPY  2005   CYSTOSCOPY W/ RETROGRADES  06/12/2011   Procedure: CYSTOSCOPY WITH RETROGRADE PYELOGRAM;  Surgeon: Kathi Ludwig, MD;  Location: [facility];  Service: Urology;  Laterality: N/A;  cysto, urethral dilation, right retrograde pyelogram     CYSTOSCOPY WITH RETROGRADE PYELOGRAM, URETEROSCOPY AND STENT PLACEMENT Left 03/08/2014   Procedure: CYSTOSCOPY WITH LEFT RETROGRADE PYELOGRAM/LEFT  URETEROSCOPY;  Surgeon: Kathi Ludwig, MD;  [facility];  Service: Urology;  Laterality: Left;   CYSTOSCOPY WITH URETHRAL DILATATION N/A 04/20/2013   Procedure: CYSTOSCOPY WITH URETHRAL DILATATION;  Surgeon: Kathi Ludwig, MD;  [facility];  Service: Urology;  Laterality: N/A;   CYSTOSCOPY/URETEROSCOPY/HOLMIUM LASER/STENT PLACEMENT Left 06/10/2017   Procedure: CYSTOSCOPY/RETROGRADE/URETEROSCOPY/HOLMIUM LASER/STENT PLACEMENT;  Surgeon: Heloise Purpura, MD;  [facility];  Service: Urology;  Laterality: Left;   CYSTOSCOPY/URETEROSCOPY/HOLMIUM LASER/STENT PLACEMENT Right 03/09/2019   Procedure: CYSTOSCOPY/RETROGRADE/URETEROSCOPY/HOLMIUM LASER/STENT PLACEMENT;  Surgeon: Heloise Purpura, MD;  Location: [facility];  Service: Urology;  Laterality: Right;  ONLY NEEDS 60 MIN   ENDOSCOPIC RETROGRADE CHOLANGIOPANCREATOGRAPHY (ERCP) WITH PROPOFOL N/A 04/13/2019   Procedure: ENDOSCOPIC RETROGRADE CHOLANGIOPANCREATOGRAPHY (ERCP) WITH PROPOFOL;  Surgeon: Lynann Bologna, MD;   [facility];  Service: Endoscopy;  Laterality: N/A;   EXTRACORPOREAL SHOCK WAVE LITHOTRIPSY  01-17-2007   LEFT   HERNIA REPAIR  01/30/2019   [facility] Left inguinal hernia repair   HOLMIUM LASER APPLICATION Left 03/08/2014   Procedure: LASER OF LEFT RENAL PELVIC STONE;  Surgeon: Kathi Ludwig, MD;  [facility];  Service: Urology;  Laterality: Left;   INGUINAL HERNIA REPAIR  2003   JOINT REPLACEMENT     total knee right 03-01-17   LEFT ACHILLES TENDON REPAIR  1992   MASS EXCISION N/A 11/14/2018   Procedure: EXCISIONAL BIOPSY OF GLANS PENIS;  Surgeon: Heloise Purpura, MD;  [facility];  Service: Urology;  Laterality: N/A;  ONLY NEEDS 60 MIN   NASAL SINUS SURGERY  2012   PENILE SURGERY  OF MEATUS  1955   REMOVAL OF STONES  04/13/2019   Procedure: REMOVAL OF STONES;  Surgeon: Lynann Bologna, MD;  [facility];  Service: Endoscopy;;   SPHINCTEROTOMY  04/13/2019   Procedure: Dennison Mascot;  Surgeon: Lynann Bologna, MD;  [facility];  Service: Endoscopy;;   TRANSTHORACIC ECHOCARDIOGRAM  03-31-2011   NORMAL LVEF  59%/ TRIVIAL MR/ DIASTOLIC DYSFUNCTION/ MODERATE LVH   TRANSURETHRAL RESECTION OF PROSTATE N/A 04/20/2013   Procedure: TRANSURETHRAL RESECTION OF THE PROSTATE WITH GYRUS INSTRUMENTS;  Surgeon:  Kathi Ludwig, MD;  [facility];  Service: Urology;  Laterality: N/A;    Allergies  Allergies  Allergen Reactions   Hydrocodone Other (See Comments)    insomnia "awake for 48 hours"    Home Medications    Prior to Admission medications   Medication Sig Start Date End Date Taking? Authorizing Provider  apixaban (ELIQUIS) 5 MG TABS tablet Take 1 tablet (5 mg total) by mouth 2 (two) times daily. 10/24/21   Georgeanna Lea, MD  atorvastatin (LIPITOR) 20 MG tablet Take 1 tablet (20 mg total) by mouth at bedtime. Taking 20 MG Daily 06/12/21   Georgeanna Lea, MD  benazepril (LOTENSIN) 20 MG tablet Take 1 tablet (20 mg total) by  mouth daily. 04/13/22   Georgeanna Lea, MD  carvedilol (COREG) 12.5 MG tablet Take 1 tablet (12.5 mg total) by mouth 2 (two) times daily. 04/03/22   Georgeanna Lea, MD  ferrous sulfate 325 (65 FE) MG tablet Take 325 mg by mouth daily.    [provider]  furosemide (LASIX) 40 MG tablet Take 1 tablet (40 mg total) by mouth daily. 08/18/21   Georgeanna Lea, MD  omeprazole (PRILOSEC) 20 MG capsule Take 20 mg by mouth daily.     [provider]  timolol (TIMOPTIC) 0.5 % ophthalmic solution Place 1 drop into both eyes 2 (two) times daily.     [provider]  hydrochlorothiazide (HYDRODIURIL) 12.5 MG tablet Take 1 tablet (12.5 mg total) by mouth daily. 06/16/21 08/18/21  Georgeanna Lea, MD    Physical Exam    Vital Signs:  Amarri Satterly does not have vital signs available for review today.  Given telephonic nature of communication, physical exam is limited. AAOx3. NAD. Normal affect.  Speech and respirations are unlabored.  Accessory Clinical Findings    None  Assessment & Plan    1.  Preoperative Cardiovascular Risk Assessment: According to the Revised Cardiac Risk Index (RCRI), his Perioperative Risk of Major Cardiac Event is (%): 0.9. His Functional Capacity in METs is: 6.36 according to the Duke Activity Status Index (DASI). Therefore, based on ACC/AHA guidelines, patient would be at acceptable risk for the planned procedure without further cardiovascular testing.  The patient was advised that if he develops new symptoms prior to surgery to contact our office to arrange for a follow-up visit, and he verbalized understanding.  Per office protocol, patient can hold Eliquis for 3 days prior to procedure. Please resume Eliquis as soon as possible postprocedure, at the discretion of the surgeon.   A copy of this note will be routed to requesting surgeon.  Time:   Today, I have spent 5 minutes with the patient with telehealth technology discussing  medical history, symptoms, and management plan.     Joylene Grapes, NP  05/28/2022, 10:29 AM

## 2022-05-28 NOTE — Telephone Encounter (Signed)
Results reviewed with pt as per Dr. Krasowski's note.  Pt verbalized understanding and had no additional questions. Routed to PCP  

## 2022-06-03 DIAGNOSIS — I482 Chronic atrial fibrillation, unspecified: Secondary | ICD-10-CM | POA: Diagnosis not present

## 2022-06-03 DIAGNOSIS — E782 Mixed hyperlipidemia: Secondary | ICD-10-CM | POA: Diagnosis not present

## 2022-06-08 DIAGNOSIS — R7303 Prediabetes: Secondary | ICD-10-CM | POA: Diagnosis not present

## 2022-06-08 DIAGNOSIS — Z6828 Body mass index (BMI) 28.0-28.9, adult: Secondary | ICD-10-CM | POA: Diagnosis not present

## 2022-06-08 DIAGNOSIS — E782 Mixed hyperlipidemia: Secondary | ICD-10-CM | POA: Diagnosis not present

## 2022-06-08 DIAGNOSIS — I482 Chronic atrial fibrillation, unspecified: Secondary | ICD-10-CM | POA: Diagnosis not present

## 2022-06-08 DIAGNOSIS — I10 Essential (primary) hypertension: Secondary | ICD-10-CM | POA: Diagnosis not present

## 2022-06-10 ENCOUNTER — Telehealth: Payer: Self-pay | Admitting: *Deleted

## 2022-06-10 ENCOUNTER — Telehealth: Payer: Self-pay | Admitting: Cardiology

## 2022-06-10 NOTE — Telephone Encounter (Signed)
See 4/09 encounter.  Patient is returning call regarding clearance. He requested that the call be returned to his wife's phone at [phone] because sometimes he doesn't hear the calls.

## 2022-06-10 NOTE — Telephone Encounter (Signed)
S/w pt is set up for telephone clearance.    Patient Consent for Virtual Visit         Spencer Cochran has provided verbal consent on 06/10/2022 for a virtual visit (video or telephone).   CONSENT FOR VIRTUAL VISIT FOR:  Spencer Cochran  By participating in this virtual visit I agree to the following:  I hereby voluntarily request, consent and authorize [facility] and its employed or contracted physicians, physician assistants, nurse practitioners or other licensed health care professionals (the Practitioner), to provide me with telemedicine health care services (the "Services") as deemed necessary by the treating Practitioner. I acknowledge and consent to receive the Services by the Practitioner via telemedicine. I understand that the telemedicine visit will involve communicating with the Practitioner through live audiovisual communication technology and the disclosure of certain medical information by electronic transmission. I acknowledge that I have been given the opportunity to request an in-person assessment or other available alternative prior to the telemedicine visit and am voluntarily participating in the telemedicine visit.  I understand that I have the right to withhold or withdraw my consent to the use of telemedicine in the course of my care at any time, without affecting my right to future care or treatment, and that the Practitioner or I may terminate the telemedicine visit at any time. I understand that I have the right to inspect all information obtained and/or recorded in the course of the telemedicine visit and may receive copies of available information for a reasonable fee.  I understand that some of the potential risks of receiving the Services via telemedicine include:  Delay or interruption in medical evaluation due to technological equipment failure or disruption; Information transmitted may not be sufficient (e.g. poor resolution of images) to allow for appropriate  medical decision making by the Practitioner; and/or  In rare instances, security protocols could fail, causing a breach of personal health information.  Furthermore, I acknowledge that it is my responsibility to provide information about my medical history, conditions and care that is complete and accurate to the best of my ability. I acknowledge that Practitioner's advice, recommendations, and/or decision may be based on factors not within their control, such as incomplete or inaccurate data provided by me or distortions of diagnostic images or specimens that may result from electronic transmissions. I understand that the practice of medicine is not an exact science and that Practitioner makes no warranties or guarantees regarding treatment outcomes. I acknowledge that a copy of this consent can be made available to me via my patient portal [facility] MyChart), or I can request a printed copy by calling the office of [facility].    I understand that my insurance will be billed for this visit.   I have read or had this consent read to me. I understand the contents of this consent, which adequately explains the benefits and risks of the Services being provided via telemedicine.  I have been provided ample opportunity to ask questions regarding this consent and the Services and have had my questions answered to my satisfaction. I give my informed consent for the services to be provided through the use of telemedicine in my medical care

## 2022-06-22 DIAGNOSIS — D500 Iron deficiency anemia secondary to blood loss (chronic): Secondary | ICD-10-CM | POA: Diagnosis not present

## 2022-06-22 DIAGNOSIS — R195 Other fecal abnormalities: Secondary | ICD-10-CM | POA: Diagnosis not present

## 2022-06-22 DIAGNOSIS — K5904 Chronic idiopathic constipation: Secondary | ICD-10-CM | POA: Diagnosis not present

## 2022-06-30 ENCOUNTER — Ambulatory Visit: Payer: Medicare PPO | Attending: Cardiology

## 2022-06-30 DIAGNOSIS — L03011 Cellulitis of right finger: Secondary | ICD-10-CM | POA: Diagnosis not present

## 2022-06-30 DIAGNOSIS — Z6828 Body mass index (BMI) 28.0-28.9, adult: Secondary | ICD-10-CM | POA: Diagnosis not present

## 2022-06-30 DIAGNOSIS — Z01810 Encounter for preprocedural cardiovascular examination: Secondary | ICD-10-CM | POA: Diagnosis not present

## 2022-06-30 NOTE — Progress Notes (Signed)
Virtual Visit via Telephone Note   Because of Spencer Cochran's co-morbid illnesses, he is at least at moderate risk for complications without adequate follow up.  This format is felt to be most appropriate for this patient at this time.  The patient did not have access to video technology/had technical difficulties with video requiring transitioning to audio format only (telephone).  All issues noted in this document were discussed and addressed.  No physical exam could be performed with this format.  Please refer to the patient's chart for his consent to telehealth for [facility].  Evaluation Performed:  Preoperative cardiovascular risk assessment _____________   Date:  06/30/2022   Patient ID:  Spencer Cochran, DOB [birth year], MRN [id number] Patient Location:  Home Provider location:   Office  Primary Care Provider:  Charlott Rakes, MD Primary Cardiologist:  Gypsy Balsam, MD  Chief Complaint / Patient Profile   77 y.o. y/o male with a h/o CAD, permanent atrial fibrillation, hyperlipidemia, and OSA  who is pending total shoulder replacement and presents today for telephonic preoperative cardiovascular risk assessment.  History of Present Illness    Spencer Cochran is a 77 y.o. male who presents via audio/video conferencing for a telehealth visit today.  Pt was last seen in cardiology clinic on 03/09/2022 by Dr. Bing Matter.  At that time Spencer Cochran was doing well with no new cardiac complaints. The patient is now pending procedure as outlined above. Since his last visit, he is experienced no new cardiac complaints since his follow-up visit.  He reports staying very active and is able to do ADLs and work in his yard  He denies chest pain, shortness of breath, lower extremity edema, fatigue, palpitations, melena, hematuria, hemoptysis, diaphoresis, weakness, presyncope, syncope, orthopnea, and PND.    Past Medical History    Past Medical History:  Diagnosis Date    Arthritis knees and ankle   Asymptomatic gallstones    Atrial fibrillation (HCC) 03/08/2014   Benign neoplasm of colon 06/13/2020   Benign prostatic hypertrophy with incomplete bladder emptying 04/20/2013   Biliary obstruction    Cancer (HCC)    skin    Cataract immature BILATERAL EYES   Choledocholithiasis    Coronary artery disease CARDIOLOGIST-  DR Bing Matter  Rosalita Levan)---  LAST VISIT 04-29-2011   DENIES CARDIAC SYMPTOMS   Coronary artery disease involving native coronary artery of native heart without angina pectoris 10/09/2014   Luminal disease but cardiac catheterization from 2014, stress test in the summer of 2018 showed no evidence of ischemia  Formatting of this note might be different from the original. 20% circumflex 20% RCA in July 2014   COVID-19 virus infection 02/2020   DDD (degenerative disc disease), lumbar    Dyslipidemia 10/01/2016   Dysrhythmia    A Fib    Endocarditis 06/13/2020   Apr 25, 2007 Entered By: Elissa Hefty C Comment: AORTIC STENOSIS, MITRAL AND TRICUSPID REGURGITATION   Frequency of urination    Gait abnormality 08/03/2018   GERD (gastroesophageal reflux disease)    Glaucoma    both eyes   History of colon polyps    History of kidney stones    Hypertension    Impaired hearing has bilateral aids--  but does not wear at all times   Kidney stone 06/13/2020   Apr 25, 2007 Entered By: Elissa Hefty C Comment: TREATED WITH LITHOTRIPSY   Left ventricular diastolic dysfunction PER ECHO [id number]  W/ CHART   Low back pain, unspecified 03/11/2021   Nocturia  Obstructive jaundice    Obstructive sleep apnea 06/05/2019   OSA on CPAP    wears cpap   Palpitations 08/10/2019   Paresthesia 07/18/2018   Pre-diabetes    last hemaglobin a 1 c was 5.6   Unsteadiness    Urethral stricture    Past Surgical History:  Procedure Laterality Date   APPENDECTOMY  1972   BALLOON DILATION N/A 04/13/2019   Procedure: BALLOON DILATION;  Surgeon: Lynann Bologna, MD;  [facility];  Service: Endoscopy;  Laterality: N/A;   CARDIOVASCULAR STRESS TEST  04-29-2010   DR KRASAWSKI   NO EVIDENCE ISCHEMIA/ NORMAL LVSF AND WALL MOTION/ EF 63%   CERVICAL FUSION  2006   C3 - 6   COLONOSCOPY  03/23/2012   Small colonic polyps, status post polypectomy. Pancolonic diverticulosis predominantly in the left colon.Small internal and external hemorrhoids   COLONOSCOPY  2019   Dr Rayfield Citizen. Diverticulosis. Doesn't think he removed polpys    CYSTO/ URETHRAL DILATION/ TRANSURETHRAL INCISIONOF PROSTATE  01-13-2007   CYSTOSCOPY  2005   CYSTOSCOPY W/ RETROGRADES  06/12/2011   Procedure: CYSTOSCOPY WITH RETROGRADE PYELOGRAM;  Surgeon: Kathi Ludwig, MD;  [facility];  Service: Urology;  Laterality: N/A;  cysto, urethral dilation, right retrograde pyelogram     CYSTOSCOPY WITH RETROGRADE PYELOGRAM, URETEROSCOPY AND STENT PLACEMENT Left 03/08/2014   Procedure: CYSTOSCOPY WITH LEFT RETROGRADE PYELOGRAM/LEFT  URETEROSCOPY;  Surgeon: Kathi Ludwig, MD;  [facility];  Service: Urology;  Laterality: Left;   CYSTOSCOPY WITH URETHRAL DILATATION N/A 04/20/2013   Procedure: CYSTOSCOPY WITH URETHRAL DILATATION;  Surgeon: Kathi Ludwig, MD;  [facility];  Service: Urology;  Laterality: N/A;   CYSTOSCOPY/URETEROSCOPY/HOLMIUM LASER/STENT PLACEMENT Left 06/10/2017   Procedure: CYSTOSCOPY/RETROGRADE/URETEROSCOPY/HOLMIUM LASER/STENT PLACEMENT;  Surgeon: Heloise Purpura, MD;  [facility];  Service: Urology;  Laterality: Left;   CYSTOSCOPY/URETEROSCOPY/HOLMIUM LASER/STENT PLACEMENT Right 03/09/2019   Procedure: CYSTOSCOPY/RETROGRADE/URETEROSCOPY/HOLMIUM LASER/STENT PLACEMENT;  Surgeon: Heloise Purpura, MD;  Location: [facility];  Service: Urology;  Laterality: Right;  ONLY NEEDS 60 MIN   ENDOSCOPIC RETROGRADE CHOLANGIOPANCREATOGRAPHY (ERCP) WITH PROPOFOL N/A 04/13/2019   Procedure: ENDOSCOPIC RETROGRADE CHOLANGIOPANCREATOGRAPHY (ERCP) WITH  PROPOFOL;  Surgeon: Lynann Bologna, MD;  [facility];  Service: Endoscopy;  Laterality: N/A;   EXTRACORPOREAL SHOCK WAVE LITHOTRIPSY  01-17-2007   LEFT   HERNIA REPAIR  01/30/2019   [facility] Left inguinal hernia repair   HOLMIUM LASER APPLICATION Left 03/08/2014   Procedure: LASER OF LEFT RENAL PELVIC STONE;  Surgeon: Kathi Ludwig, MD;  [facility];  Service: Urology;  Laterality: Left;   INGUINAL HERNIA REPAIR  2003   JOINT REPLACEMENT     total knee right 03-01-17   LEFT ACHILLES TENDON REPAIR  1992   MASS EXCISION N/A 11/14/2018   Procedure: EXCISIONAL BIOPSY OF GLANS PENIS;  Surgeon: Heloise Purpura, MD;  [facility];  Service: Urology;  Laterality: N/A;  ONLY NEEDS 60 MIN   NASAL SINUS SURGERY  2012   PENILE SURGERY  OF MEATUS  1955   REMOVAL OF STONES  04/13/2019   Procedure: REMOVAL OF STONES;  Surgeon: Lynann Bologna, MD;  [facility];  Service: Endoscopy;;   SPHINCTEROTOMY  04/13/2019   Procedure: Dennison Mascot;  Surgeon: Lynann Bologna, MD;  [facility];  Service: Endoscopy;;   TRANSTHORACIC ECHOCARDIOGRAM  03-31-2011   NORMAL LVEF  59%/ TRIVIAL MR/ DIASTOLIC DYSFUNCTION/ MODERATE LVH   TRANSURETHRAL RESECTION OF PROSTATE N/A 04/20/2013   Procedure: TRANSURETHRAL RESECTION OF THE  PROSTATE WITH GYRUS INSTRUMENTS;  Surgeon: Kathi Ludwig, MD;  [facility];  Service: Urology;  Laterality: N/A;    Allergies  Allergies  Allergen Reactions   Hydrocodone Other (See Comments)    insomnia "awake for 48 hours"    Home Medications    Prior to Admission medications   Medication Sig Start Date End Date Taking? Authorizing Provider  apixaban (ELIQUIS) 5 MG TABS tablet Take 1 tablet (5 mg total) by mouth 2 (two) times daily. 10/24/21   Georgeanna Lea, MD  atorvastatin (LIPITOR) 20 MG tablet Take 1 tablet (20 mg total) by mouth at bedtime. Taking 20 MG Daily 06/12/21   Georgeanna Lea, MD  b complex vitamins capsule  Take 1 capsule by mouth daily.    [provider]  carvedilol (COREG) 12.5 MG tablet Take 1 tablet (12.5 mg total) by mouth 2 (two) times daily. 04/03/22   Georgeanna Lea, MD  ferrous sulfate 325 (65 FE) MG tablet Take 325 mg by mouth daily.    [provider]  furosemide (LASIX) 40 MG tablet Take 1 tablet (40 mg total) by mouth daily. 08/18/21   Georgeanna Lea, MD  NON FORMULARY Take 1 Units by mouth in the morning and at bedtime. I health/macular    [provider]  omeprazole (PRILOSEC) 20 MG capsule Take 20 mg by mouth daily.     [provider]  tadalafil (CIALIS) 5 MG tablet Take 5 mg by mouth daily as needed for erectile dysfunction. 06/06/22   [provider]  timolol (TIMOPTIC) 0.5 % ophthalmic solution Place 1 drop into both eyes 2 (two) times daily.     [provider]  traZODone (DESYREL) 50 MG tablet Take 50 mg by mouth at bedtime. 06/01/22   [provider]  hydrochlorothiazide (HYDRODIURIL) 12.5 MG tablet Take 1 tablet (12.5 mg total) by mouth daily. 06/16/21 08/18/21  Georgeanna Lea, MD    Physical Exam    Vital Signs:  Spencer Cochran does not have vital signs available for review today.  Given telephonic nature of communication, physical exam is limited. AAOx3. NAD. Normal affect.  Speech and respirations are unlabored.  Accessory Clinical Findings    None  Assessment & Plan    1.  Preoperative Cardiovascular Risk Assessment:  RCRI: 0.9%  The patient affirms he has been doing well without any new cardiac symptoms. They are able to achieve 5 METS without cardiac limitations. Therefore, based on ACC/AHA guidelines, the patient would be at acceptable risk for the planned procedure without further cardiovascular testing. The patient was advised that if he develops new symptoms prior to surgery to contact our office to arrange for a follow-up visit, and he verbalized understanding.   The patient was  advised that if he develops new symptoms prior to surgery to contact our office to arrange for a follow-up visit, and he verbalized understanding.  Per office protocol, patient can hold Eliquis for 3 days prior to procedure.    A copy of this note will be routed to requesting surgeon.  Time:   Today, I have spent 6 minutes with the patient with telehealth technology discussing medical history, symptoms, and management plan.     Napoleon Form, Leodis Rains, NP  06/30/2022, 7:51 AM

## 2022-07-04 DIAGNOSIS — E782 Mixed hyperlipidemia: Secondary | ICD-10-CM | POA: Diagnosis not present

## 2022-07-04 DIAGNOSIS — I10 Essential (primary) hypertension: Secondary | ICD-10-CM | POA: Diagnosis not present

## 2022-07-08 DIAGNOSIS — Z6828 Body mass index (BMI) 28.0-28.9, adult: Secondary | ICD-10-CM | POA: Diagnosis not present

## 2022-07-08 DIAGNOSIS — L03011 Cellulitis of right finger: Secondary | ICD-10-CM | POA: Diagnosis not present

## 2022-07-16 DIAGNOSIS — Z79899 Other long term (current) drug therapy: Secondary | ICD-10-CM | POA: Diagnosis not present

## 2022-07-16 DIAGNOSIS — Z01818 Encounter for other preprocedural examination: Secondary | ICD-10-CM | POA: Diagnosis not present

## 2022-07-16 DIAGNOSIS — I454 Nonspecific intraventricular block: Secondary | ICD-10-CM | POA: Diagnosis not present

## 2022-07-16 DIAGNOSIS — E559 Vitamin D deficiency, unspecified: Secondary | ICD-10-CM | POA: Diagnosis not present

## 2022-07-16 DIAGNOSIS — I491 Atrial premature depolarization: Secondary | ICD-10-CM | POA: Diagnosis not present

## 2022-07-16 DIAGNOSIS — M79609 Pain in unspecified limb: Secondary | ICD-10-CM | POA: Diagnosis not present

## 2022-07-16 DIAGNOSIS — I4891 Unspecified atrial fibrillation: Secondary | ICD-10-CM | POA: Diagnosis not present

## 2022-07-20 ENCOUNTER — Telehealth: Payer: Self-pay | Admitting: Cardiology

## 2022-07-20 NOTE — Telephone Encounter (Signed)
Patient states he is returning a call, but he doesn't know who called.

## 2022-07-20 NOTE — Telephone Encounter (Signed)
Called pt and advised that we had not called him from the clinical side of the office and if the caller did not leave a message they would call back.

## 2022-07-23 ENCOUNTER — Telehealth: Payer: Self-pay | Admitting: Cardiology

## 2022-07-23 NOTE — Telephone Encounter (Signed)
Patient is requesting call back to discuss echo results that were to be sent to a requesting office for shoulder surgery on 07/11.

## 2022-07-24 NOTE — Telephone Encounter (Signed)
Spoke with pt. He stated that he needed the Echocardiogram sent to Dr. Deberah Castle for his upcoming surgery. Routed Ecxho to Dr. Deberah Castle per pt request.

## 2022-07-27 ENCOUNTER — Ambulatory Visit: Payer: Medicare PPO | Admitting: Podiatry

## 2022-07-28 DIAGNOSIS — K2100 Gastro-esophageal reflux disease with esophagitis, without bleeding: Secondary | ICD-10-CM | POA: Diagnosis not present

## 2022-07-28 DIAGNOSIS — Z9682 Presence of neurostimulator: Secondary | ICD-10-CM | POA: Diagnosis not present

## 2022-07-28 DIAGNOSIS — I482 Chronic atrial fibrillation, unspecified: Secondary | ICD-10-CM | POA: Diagnosis not present

## 2022-07-28 DIAGNOSIS — G4733 Obstructive sleep apnea (adult) (pediatric): Secondary | ICD-10-CM | POA: Diagnosis not present

## 2022-07-29 ENCOUNTER — Encounter: Payer: Self-pay | Admitting: Orthopedic Surgery

## 2022-07-30 DIAGNOSIS — Z6828 Body mass index (BMI) 28.0-28.9, adult: Secondary | ICD-10-CM | POA: Diagnosis not present

## 2022-07-30 DIAGNOSIS — Z01818 Encounter for other preprocedural examination: Secondary | ICD-10-CM | POA: Diagnosis not present

## 2022-08-03 ENCOUNTER — Ambulatory Visit (INDEPENDENT_AMBULATORY_CARE_PROVIDER_SITE_OTHER): Payer: Medicare PPO | Admitting: Podiatry

## 2022-08-03 DIAGNOSIS — M79674 Pain in right toe(s): Secondary | ICD-10-CM | POA: Diagnosis not present

## 2022-08-03 DIAGNOSIS — I10 Essential (primary) hypertension: Secondary | ICD-10-CM | POA: Diagnosis not present

## 2022-08-03 DIAGNOSIS — M79675 Pain in left toe(s): Secondary | ICD-10-CM

## 2022-08-03 DIAGNOSIS — I739 Peripheral vascular disease, unspecified: Secondary | ICD-10-CM

## 2022-08-03 DIAGNOSIS — M19012 Primary osteoarthritis, left shoulder: Secondary | ICD-10-CM | POA: Diagnosis not present

## 2022-08-03 DIAGNOSIS — B351 Tinea unguium: Secondary | ICD-10-CM | POA: Diagnosis not present

## 2022-08-03 DIAGNOSIS — E782 Mixed hyperlipidemia: Secondary | ICD-10-CM | POA: Diagnosis not present

## 2022-08-03 NOTE — Progress Notes (Signed)
  Subjective:  Patient ID: Spencer Cochran, male    DOB: 1945/05/14,  MRN: [id number]  Chief Complaint  Patient presents with   Nail Problem    Routine Foot Care- nail trim     77 y.o. male presents with the above complaint. History confirmed with patient. Patient presenting with pain related to dystrophic thickened elongated nails. Patient is unable to trim own nails related to nail dystrophy and/or mobility issues. Patient does not have a history of T2DM but is pre DM.   Objective:  Physical Exam: warm, good capillary refill nail exam onychomycosis of the toenails, onycholysis, and dystrophic nails DP pulses palpable, PT pulses palpable, and protective sensation intact Left Foot:  Pain with palpation of nails due to elongation and dystrophic growth.  Right Foot: Pain with palpation of nails due to elongation and dystrophic growth.   Assessment:   1. Pain due to onychomycosis of toenails of both feet   2. PVD (peripheral vascular disease) (HCC)        Plan:  Patient was evaluated and treated and all questions answered.    #Onychomycosis with pain  -Nails palliatively debrided as below. -Educated on self-care  Procedure: Nail Debridement Rationale: Pain Type of Debridement: manual, sharp debridement. Instrumentation: Nail nipper, rotary burr. Number of Nails: 10  Return in about 3 months (around 11/03/2022) for RFC.         Corinna Gab, DPM [facility] / [facility]

## 2022-08-13 DIAGNOSIS — I5032 Chronic diastolic (congestive) heart failure: Secondary | ICD-10-CM | POA: Diagnosis not present

## 2022-08-13 DIAGNOSIS — G8918 Other acute postprocedural pain: Secondary | ICD-10-CM | POA: Diagnosis not present

## 2022-08-13 DIAGNOSIS — I10 Essential (primary) hypertension: Secondary | ICD-10-CM | POA: Diagnosis not present

## 2022-08-13 DIAGNOSIS — I110 Hypertensive heart disease with heart failure: Secondary | ICD-10-CM | POA: Diagnosis not present

## 2022-08-13 DIAGNOSIS — M19012 Primary osteoarthritis, left shoulder: Secondary | ICD-10-CM | POA: Diagnosis not present

## 2022-08-13 DIAGNOSIS — Z96612 Presence of left artificial shoulder joint: Secondary | ICD-10-CM | POA: Diagnosis not present

## 2022-08-13 DIAGNOSIS — E785 Hyperlipidemia, unspecified: Secondary | ICD-10-CM | POA: Diagnosis not present

## 2022-08-13 DIAGNOSIS — Z471 Aftercare following joint replacement surgery: Secondary | ICD-10-CM | POA: Diagnosis not present

## 2022-08-13 DIAGNOSIS — I2510 Atherosclerotic heart disease of native coronary artery without angina pectoris: Secondary | ICD-10-CM | POA: Diagnosis not present

## 2022-08-13 DIAGNOSIS — M75101 Unspecified rotator cuff tear or rupture of right shoulder, not specified as traumatic: Secondary | ICD-10-CM | POA: Diagnosis not present

## 2022-08-13 DIAGNOSIS — I4821 Permanent atrial fibrillation: Secondary | ICD-10-CM | POA: Diagnosis not present

## 2022-08-13 DIAGNOSIS — R2689 Other abnormalities of gait and mobility: Secondary | ICD-10-CM | POA: Diagnosis not present

## 2022-08-14 DIAGNOSIS — R2689 Other abnormalities of gait and mobility: Secondary | ICD-10-CM | POA: Diagnosis not present

## 2022-08-14 DIAGNOSIS — I2510 Atherosclerotic heart disease of native coronary artery without angina pectoris: Secondary | ICD-10-CM | POA: Diagnosis not present

## 2022-08-14 DIAGNOSIS — G8918 Other acute postprocedural pain: Secondary | ICD-10-CM | POA: Diagnosis not present

## 2022-08-14 DIAGNOSIS — M75101 Unspecified rotator cuff tear or rupture of right shoulder, not specified as traumatic: Secondary | ICD-10-CM | POA: Diagnosis not present

## 2022-08-14 DIAGNOSIS — E785 Hyperlipidemia, unspecified: Secondary | ICD-10-CM | POA: Diagnosis not present

## 2022-08-14 DIAGNOSIS — I4821 Permanent atrial fibrillation: Secondary | ICD-10-CM | POA: Diagnosis not present

## 2022-08-14 DIAGNOSIS — I110 Hypertensive heart disease with heart failure: Secondary | ICD-10-CM | POA: Diagnosis not present

## 2022-08-14 DIAGNOSIS — I5032 Chronic diastolic (congestive) heart failure: Secondary | ICD-10-CM | POA: Diagnosis not present

## 2022-08-14 DIAGNOSIS — M19012 Primary osteoarthritis, left shoulder: Secondary | ICD-10-CM | POA: Diagnosis not present

## 2022-08-19 ENCOUNTER — Telehealth: Payer: Self-pay | Admitting: Cardiology

## 2022-08-19 NOTE — Telephone Encounter (Signed)
Picture of pts arm showed to Dr. Bing Matter who states for the pt to use warm compresses to area. No need to stop Eliquis unless area is getting larger. Pt verbalized understanding and had no additional questions.

## 2022-08-19 NOTE — Telephone Encounter (Signed)
Caller stated patient has been holding his Eliquis prior to surgery and has a f/u visit with his surgeon tomorrow.  Caller stated patient resumed Eliquis and has been having some internal bleeding.  Caller wants to know if patient should hold medication.  Caller noted can also leave voice message.

## 2022-08-20 DIAGNOSIS — M25512 Pain in left shoulder: Secondary | ICD-10-CM | POA: Diagnosis not present

## 2022-08-20 DIAGNOSIS — M25612 Stiffness of left shoulder, not elsewhere classified: Secondary | ICD-10-CM | POA: Diagnosis not present

## 2022-08-24 DIAGNOSIS — M25512 Pain in left shoulder: Secondary | ICD-10-CM | POA: Diagnosis not present

## 2022-08-24 DIAGNOSIS — M25612 Stiffness of left shoulder, not elsewhere classified: Secondary | ICD-10-CM | POA: Diagnosis not present

## 2022-08-26 DIAGNOSIS — M25512 Pain in left shoulder: Secondary | ICD-10-CM | POA: Diagnosis not present

## 2022-08-26 DIAGNOSIS — M25612 Stiffness of left shoulder, not elsewhere classified: Secondary | ICD-10-CM | POA: Diagnosis not present

## 2022-08-31 DIAGNOSIS — M25512 Pain in left shoulder: Secondary | ICD-10-CM | POA: Diagnosis not present

## 2022-08-31 DIAGNOSIS — M25612 Stiffness of left shoulder, not elsewhere classified: Secondary | ICD-10-CM | POA: Diagnosis not present

## 2022-09-01 ENCOUNTER — Other Ambulatory Visit: Payer: Self-pay | Admitting: Cardiology

## 2022-09-02 DIAGNOSIS — M25512 Pain in left shoulder: Secondary | ICD-10-CM | POA: Diagnosis not present

## 2022-09-02 DIAGNOSIS — M25612 Stiffness of left shoulder, not elsewhere classified: Secondary | ICD-10-CM | POA: Diagnosis not present

## 2022-09-03 DIAGNOSIS — I10 Essential (primary) hypertension: Secondary | ICD-10-CM | POA: Diagnosis not present

## 2022-09-03 DIAGNOSIS — Z1389 Encounter for screening for other disorder: Secondary | ICD-10-CM | POA: Diagnosis not present

## 2022-09-03 DIAGNOSIS — Z1339 Encounter for screening examination for other mental health and behavioral disorders: Secondary | ICD-10-CM | POA: Diagnosis not present

## 2022-09-03 DIAGNOSIS — Z1331 Encounter for screening for depression: Secondary | ICD-10-CM | POA: Diagnosis not present

## 2022-09-03 DIAGNOSIS — E782 Mixed hyperlipidemia: Secondary | ICD-10-CM | POA: Diagnosis not present

## 2022-09-03 DIAGNOSIS — Z0000 Encounter for general adult medical examination without abnormal findings: Secondary | ICD-10-CM | POA: Diagnosis not present

## 2022-09-03 DIAGNOSIS — Z136 Encounter for screening for cardiovascular disorders: Secondary | ICD-10-CM | POA: Diagnosis not present

## 2022-09-03 DIAGNOSIS — Z139 Encounter for screening, unspecified: Secondary | ICD-10-CM | POA: Diagnosis not present

## 2022-09-07 DIAGNOSIS — M25512 Pain in left shoulder: Secondary | ICD-10-CM | POA: Diagnosis not present

## 2022-09-07 DIAGNOSIS — M25612 Stiffness of left shoulder, not elsewhere classified: Secondary | ICD-10-CM | POA: Diagnosis not present

## 2022-09-09 DIAGNOSIS — N529 Male erectile dysfunction, unspecified: Secondary | ICD-10-CM | POA: Diagnosis not present

## 2022-09-09 DIAGNOSIS — N200 Calculus of kidney: Secondary | ICD-10-CM | POA: Diagnosis not present

## 2022-09-09 DIAGNOSIS — N390 Urinary tract infection, site not specified: Secondary | ICD-10-CM | POA: Diagnosis not present

## 2022-09-09 DIAGNOSIS — N401 Enlarged prostate with lower urinary tract symptoms: Secondary | ICD-10-CM | POA: Diagnosis not present

## 2022-09-11 ENCOUNTER — Other Ambulatory Visit: Payer: Self-pay | Admitting: Cardiology

## 2022-09-14 DIAGNOSIS — M25512 Pain in left shoulder: Secondary | ICD-10-CM | POA: Diagnosis not present

## 2022-09-14 DIAGNOSIS — M25612 Stiffness of left shoulder, not elsewhere classified: Secondary | ICD-10-CM | POA: Diagnosis not present

## 2022-09-16 DIAGNOSIS — M25512 Pain in left shoulder: Secondary | ICD-10-CM | POA: Diagnosis not present

## 2022-09-16 DIAGNOSIS — M25612 Stiffness of left shoulder, not elsewhere classified: Secondary | ICD-10-CM | POA: Diagnosis not present

## 2022-09-21 DIAGNOSIS — M25512 Pain in left shoulder: Secondary | ICD-10-CM | POA: Diagnosis not present

## 2022-09-21 DIAGNOSIS — M25612 Stiffness of left shoulder, not elsewhere classified: Secondary | ICD-10-CM | POA: Diagnosis not present

## 2022-09-23 DIAGNOSIS — M25612 Stiffness of left shoulder, not elsewhere classified: Secondary | ICD-10-CM | POA: Diagnosis not present

## 2022-09-23 DIAGNOSIS — M25512 Pain in left shoulder: Secondary | ICD-10-CM | POA: Diagnosis not present

## 2022-09-28 ENCOUNTER — Telehealth: Payer: Self-pay | Admitting: Cardiology

## 2022-09-28 DIAGNOSIS — M19012 Primary osteoarthritis, left shoulder: Secondary | ICD-10-CM | POA: Diagnosis not present

## 2022-09-28 DIAGNOSIS — M25512 Pain in left shoulder: Secondary | ICD-10-CM | POA: Diagnosis not present

## 2022-09-28 DIAGNOSIS — M25612 Stiffness of left shoulder, not elsewhere classified: Secondary | ICD-10-CM | POA: Diagnosis not present

## 2022-09-28 NOTE — Telephone Encounter (Signed)
Pt c/o Shortness Of Breath: STAT if SOB developed within the last 24 hours or pt is noticeably SOB on the phone  1. Are you currently SOB (can you hear that pt is SOB on the phone)?  No   2. How long have you been experiencing SOB?  About 1 month, after having shoulder replacement  3. Are you SOB when sitting or when up moving around?  When up and moving around   4. Are you currently experiencing any other symptoms?  No

## 2022-09-28 NOTE — Telephone Encounter (Signed)
Spoke with pt who reports shortness of breath after surgery. Advised to see PCP for possible PE. Pt states he will call once he is finished with his PT. Denies chest pain. Shortness of breath is not audible on the phone.

## 2022-09-30 DIAGNOSIS — M25512 Pain in left shoulder: Secondary | ICD-10-CM | POA: Diagnosis not present

## 2022-09-30 DIAGNOSIS — M25612 Stiffness of left shoulder, not elsewhere classified: Secondary | ICD-10-CM | POA: Diagnosis not present

## 2022-10-04 DIAGNOSIS — I482 Chronic atrial fibrillation, unspecified: Secondary | ICD-10-CM | POA: Diagnosis not present

## 2022-10-04 DIAGNOSIS — E782 Mixed hyperlipidemia: Secondary | ICD-10-CM | POA: Diagnosis not present

## 2022-10-06 DIAGNOSIS — M25612 Stiffness of left shoulder, not elsewhere classified: Secondary | ICD-10-CM | POA: Diagnosis not present

## 2022-10-06 DIAGNOSIS — M25512 Pain in left shoulder: Secondary | ICD-10-CM | POA: Diagnosis not present

## 2022-10-08 DIAGNOSIS — M25612 Stiffness of left shoulder, not elsewhere classified: Secondary | ICD-10-CM | POA: Diagnosis not present

## 2022-10-08 DIAGNOSIS — M25512 Pain in left shoulder: Secondary | ICD-10-CM | POA: Diagnosis not present

## 2022-10-12 DIAGNOSIS — M25612 Stiffness of left shoulder, not elsewhere classified: Secondary | ICD-10-CM | POA: Diagnosis not present

## 2022-10-12 DIAGNOSIS — M25512 Pain in left shoulder: Secondary | ICD-10-CM | POA: Diagnosis not present

## 2022-10-14 DIAGNOSIS — Z9682 Presence of neurostimulator: Secondary | ICD-10-CM | POA: Diagnosis not present

## 2022-10-14 DIAGNOSIS — G4733 Obstructive sleep apnea (adult) (pediatric): Secondary | ICD-10-CM | POA: Diagnosis not present

## 2022-10-14 DIAGNOSIS — M25512 Pain in left shoulder: Secondary | ICD-10-CM | POA: Diagnosis not present

## 2022-10-14 DIAGNOSIS — K2100 Gastro-esophageal reflux disease with esophagitis, without bleeding: Secondary | ICD-10-CM | POA: Diagnosis not present

## 2022-10-14 DIAGNOSIS — M25612 Stiffness of left shoulder, not elsewhere classified: Secondary | ICD-10-CM | POA: Diagnosis not present

## 2022-10-19 DIAGNOSIS — M25612 Stiffness of left shoulder, not elsewhere classified: Secondary | ICD-10-CM | POA: Diagnosis not present

## 2022-10-19 DIAGNOSIS — M25512 Pain in left shoulder: Secondary | ICD-10-CM | POA: Diagnosis not present

## 2022-10-21 DIAGNOSIS — M25512 Pain in left shoulder: Secondary | ICD-10-CM | POA: Diagnosis not present

## 2022-10-21 DIAGNOSIS — M25612 Stiffness of left shoulder, not elsewhere classified: Secondary | ICD-10-CM | POA: Diagnosis not present

## 2022-10-26 DIAGNOSIS — M25512 Pain in left shoulder: Secondary | ICD-10-CM | POA: Diagnosis not present

## 2022-10-26 DIAGNOSIS — M25612 Stiffness of left shoulder, not elsewhere classified: Secondary | ICD-10-CM | POA: Diagnosis not present

## 2022-11-02 DIAGNOSIS — M25512 Pain in left shoulder: Secondary | ICD-10-CM | POA: Diagnosis not present

## 2022-11-02 DIAGNOSIS — M25612 Stiffness of left shoulder, not elsewhere classified: Secondary | ICD-10-CM | POA: Diagnosis not present

## 2022-11-04 DIAGNOSIS — M25512 Pain in left shoulder: Secondary | ICD-10-CM | POA: Diagnosis not present

## 2022-11-04 DIAGNOSIS — M25612 Stiffness of left shoulder, not elsewhere classified: Secondary | ICD-10-CM | POA: Diagnosis not present

## 2022-11-09 ENCOUNTER — Encounter: Payer: Self-pay | Admitting: Podiatry

## 2022-11-09 ENCOUNTER — Ambulatory Visit (INDEPENDENT_AMBULATORY_CARE_PROVIDER_SITE_OTHER): Payer: Medicare PPO | Admitting: Podiatry

## 2022-11-09 DIAGNOSIS — M25612 Stiffness of left shoulder, not elsewhere classified: Secondary | ICD-10-CM | POA: Diagnosis not present

## 2022-11-09 DIAGNOSIS — I739 Peripheral vascular disease, unspecified: Secondary | ICD-10-CM

## 2022-11-09 DIAGNOSIS — M79675 Pain in left toe(s): Secondary | ICD-10-CM

## 2022-11-09 DIAGNOSIS — M79674 Pain in right toe(s): Secondary | ICD-10-CM | POA: Diagnosis not present

## 2022-11-09 DIAGNOSIS — B351 Tinea unguium: Secondary | ICD-10-CM | POA: Diagnosis not present

## 2022-11-09 DIAGNOSIS — M25512 Pain in left shoulder: Secondary | ICD-10-CM | POA: Diagnosis not present

## 2022-11-09 NOTE — Progress Notes (Signed)
Subjective:  Patient ID: Spencer Cochran, male    DOB: 1945-06-06,  MRN: [id number]  Chief Complaint  Patient presents with   Routine foot care    Nail care. Some 4th toe pain right side with toes pressing against each other vs pressure in shoes. Pre diabetic A1c <6    77 y.o. male presents with the above complaint. History confirmed with patient. Patient presenting with pain related to dystrophic thickened elongated nails. Patient is unable to trim own nails related to nail dystrophy and/or mobility issues. Patient does not have a history of T2DM but is pre DM.   Objective:  Physical Exam: warm, good capillary refill nail exam onychomycosis of the toenails, onycholysis, and dystrophic nails DP pulses palpable, PT pulses palpable, and protective sensation intact Left Foot:  Pain with palpation of nails due to elongation and dystrophic growth.  Right Foot: Pain with palpation of nails due to elongation and dystrophic growth.   Assessment:   1. Pain due to onychomycosis of toenails of both feet   2. PVD (peripheral vascular disease) (HCC)         Plan:  Patient was evaluated and treated and all questions answered.    #Onychomycosis with pain  -Nails palliatively debrided as below. -Educated on self-care  Procedure: Nail Debridement Rationale: Pain Type of Debridement: manual, sharp debridement. Instrumentation: Nail nipper, rotary burr. Number of Nails: 10  Return in about 3 months (around 02/09/2023) for RFC.         Corinna Gab, DPM [facility] / [facility]

## 2022-11-11 ENCOUNTER — Encounter: Payer: Self-pay | Admitting: Gastroenterology

## 2022-11-11 DIAGNOSIS — M25512 Pain in left shoulder: Secondary | ICD-10-CM | POA: Diagnosis not present

## 2022-11-11 DIAGNOSIS — M25612 Stiffness of left shoulder, not elsewhere classified: Secondary | ICD-10-CM | POA: Diagnosis not present

## 2022-11-27 DIAGNOSIS — Z23 Encounter for immunization: Secondary | ICD-10-CM | POA: Diagnosis not present

## 2022-12-04 DIAGNOSIS — I482 Chronic atrial fibrillation, unspecified: Secondary | ICD-10-CM | POA: Diagnosis not present

## 2022-12-04 DIAGNOSIS — D500 Iron deficiency anemia secondary to blood loss (chronic): Secondary | ICD-10-CM | POA: Diagnosis not present

## 2022-12-04 DIAGNOSIS — E782 Mixed hyperlipidemia: Secondary | ICD-10-CM | POA: Diagnosis not present

## 2022-12-07 DIAGNOSIS — E782 Mixed hyperlipidemia: Secondary | ICD-10-CM | POA: Diagnosis not present

## 2022-12-07 DIAGNOSIS — R7303 Prediabetes: Secondary | ICD-10-CM | POA: Diagnosis not present

## 2022-12-14 DIAGNOSIS — Z20822 Contact with and (suspected) exposure to covid-19: Secondary | ICD-10-CM | POA: Diagnosis not present

## 2022-12-14 DIAGNOSIS — R7303 Prediabetes: Secondary | ICD-10-CM | POA: Diagnosis not present

## 2022-12-14 DIAGNOSIS — I10 Essential (primary) hypertension: Secondary | ICD-10-CM | POA: Diagnosis not present

## 2022-12-14 DIAGNOSIS — I5030 Unspecified diastolic (congestive) heart failure: Secondary | ICD-10-CM | POA: Diagnosis not present

## 2022-12-14 DIAGNOSIS — Z1331 Encounter for screening for depression: Secondary | ICD-10-CM | POA: Diagnosis not present

## 2022-12-14 DIAGNOSIS — Z6828 Body mass index (BMI) 28.0-28.9, adult: Secondary | ICD-10-CM | POA: Diagnosis not present

## 2022-12-14 DIAGNOSIS — R059 Cough, unspecified: Secondary | ICD-10-CM | POA: Diagnosis not present

## 2022-12-14 DIAGNOSIS — E782 Mixed hyperlipidemia: Secondary | ICD-10-CM | POA: Diagnosis not present

## 2022-12-25 ENCOUNTER — Other Ambulatory Visit: Payer: Self-pay | Admitting: Cardiology

## 2022-12-25 DIAGNOSIS — I4821 Permanent atrial fibrillation: Secondary | ICD-10-CM

## 2022-12-25 NOTE — Telephone Encounter (Signed)
Pt last saw Dr Bing Matter 03/09/22, last labs 01/23/22 Creat 0.83, age 77, weight 85.3kg, based on specified criteria pt is on appropriate dosage of Eliquis 5mg  BID for afib.  Will refill rx.

## 2023-01-02 DIAGNOSIS — J209 Acute bronchitis, unspecified: Secondary | ICD-10-CM | POA: Diagnosis not present

## 2023-01-12 DIAGNOSIS — H3530 Unspecified macular degeneration: Secondary | ICD-10-CM | POA: Diagnosis not present

## 2023-01-12 DIAGNOSIS — H52223 Regular astigmatism, bilateral: Secondary | ICD-10-CM | POA: Diagnosis not present

## 2023-01-12 DIAGNOSIS — H401132 Primary open-angle glaucoma, bilateral, moderate stage: Secondary | ICD-10-CM | POA: Diagnosis not present

## 2023-01-12 DIAGNOSIS — Z961 Presence of intraocular lens: Secondary | ICD-10-CM | POA: Diagnosis not present

## 2023-01-12 DIAGNOSIS — Z9849 Cataract extraction status, unspecified eye: Secondary | ICD-10-CM | POA: Diagnosis not present

## 2023-01-12 DIAGNOSIS — E119 Type 2 diabetes mellitus without complications: Secondary | ICD-10-CM | POA: Diagnosis not present

## 2023-01-12 DIAGNOSIS — H353121 Nonexudative age-related macular degeneration, left eye, early dry stage: Secondary | ICD-10-CM | POA: Diagnosis not present

## 2023-01-12 DIAGNOSIS — H5213 Myopia, bilateral: Secondary | ICD-10-CM | POA: Diagnosis not present

## 2023-01-12 DIAGNOSIS — H47233 Glaucomatous optic atrophy, bilateral: Secondary | ICD-10-CM | POA: Diagnosis not present

## 2023-01-15 DIAGNOSIS — Z6828 Body mass index (BMI) 28.0-28.9, adult: Secondary | ICD-10-CM | POA: Diagnosis not present

## 2023-01-15 DIAGNOSIS — J209 Acute bronchitis, unspecified: Secondary | ICD-10-CM | POA: Diagnosis not present

## 2023-01-15 DIAGNOSIS — R059 Cough, unspecified: Secondary | ICD-10-CM | POA: Diagnosis not present

## 2023-01-15 DIAGNOSIS — Z20822 Contact with and (suspected) exposure to covid-19: Secondary | ICD-10-CM | POA: Diagnosis not present

## 2023-02-02 DIAGNOSIS — G4733 Obstructive sleep apnea (adult) (pediatric): Secondary | ICD-10-CM | POA: Diagnosis not present

## 2023-02-02 DIAGNOSIS — I482 Chronic atrial fibrillation, unspecified: Secondary | ICD-10-CM | POA: Diagnosis not present

## 2023-02-02 DIAGNOSIS — Z9682 Presence of neurostimulator: Secondary | ICD-10-CM | POA: Diagnosis not present

## 2023-02-02 DIAGNOSIS — Z789 Other specified health status: Secondary | ICD-10-CM

## 2023-02-02 DIAGNOSIS — I519 Heart disease, unspecified: Secondary | ICD-10-CM | POA: Diagnosis not present

## 2023-02-02 HISTORY — DX: Presence of neurostimulator: Z96.82

## 2023-02-02 HISTORY — DX: Other specified health status: Z78.9

## 2023-02-03 DIAGNOSIS — E782 Mixed hyperlipidemia: Secondary | ICD-10-CM | POA: Diagnosis not present

## 2023-02-03 DIAGNOSIS — I509 Heart failure, unspecified: Secondary | ICD-10-CM | POA: Diagnosis not present

## 2023-02-08 ENCOUNTER — Encounter: Payer: Self-pay | Admitting: Cardiology

## 2023-02-09 ENCOUNTER — Ambulatory Visit: Payer: Medicare PPO | Attending: Cardiology | Admitting: Cardiology

## 2023-02-09 ENCOUNTER — Encounter: Payer: Self-pay | Admitting: Cardiology

## 2023-02-09 VITALS — BP 132/74 | HR 95 | Ht 68.0 in | Wt 193.4 lb

## 2023-02-09 DIAGNOSIS — I4821 Permanent atrial fibrillation: Secondary | ICD-10-CM | POA: Diagnosis not present

## 2023-02-09 DIAGNOSIS — E785 Hyperlipidemia, unspecified: Secondary | ICD-10-CM

## 2023-02-09 DIAGNOSIS — R7303 Prediabetes: Secondary | ICD-10-CM | POA: Diagnosis not present

## 2023-02-09 DIAGNOSIS — I2510 Atherosclerotic heart disease of native coronary artery without angina pectoris: Secondary | ICD-10-CM | POA: Diagnosis not present

## 2023-02-09 DIAGNOSIS — G4733 Obstructive sleep apnea (adult) (pediatric): Secondary | ICD-10-CM

## 2023-02-09 DIAGNOSIS — Z9682 Presence of neurostimulator: Secondary | ICD-10-CM

## 2023-02-09 NOTE — Progress Notes (Signed)
 Cardiology Office Note:    Date:  02/09/2023   ID:  Spencer Cochran, DOB [birth year], MRN [id number]  PCP:  Spencer Glisson, MD  Cardiologist:  Spencer Fitch, MD    Referring MD: Spencer Glisson, MD   No chief complaint on file.   History of Present Illness:    Spencer Cochran is a 78 y.o. male  with past medical history significant for coronary artery disease, luminal disease based on cardiac catheterization done years ago recent stress test negative, permanent atrial fibrillation, anticoagulated, diastolic dysfunction, obstructive sleep apnea  Comes today to months for follow-up.  Overall she is doing fine few things happen since I seen him last time he did have Inspira device implanted for his severe obstructive sleep apnea he is very happy and satisfied with the effect of it.  He also had a reverse shoulder surgeon left side recovering from it that happened in December cardiac wise doing well.  Denies have any chest pain tightness squeezing pressure mid chest he does what he wants to do.  Since December he did not go to gym because of shoulder surgery but he is planning to go the next month.  Past Medical History:  Diagnosis Date   Arthritis knees and ankle   Asymptomatic gallstones    Atrial fibrillation (HCC) 03/08/2014   Benign neoplasm of colon 06/13/2020   Benign prostatic hyperplasia with incomplete bladder emptying 04/20/2013   IMO SNOMED Dx Update Oct 2024     Benign prostatic hypertrophy with incomplete bladder emptying 04/20/2013   Biliary obstruction    Blood in urine 06/13/2020   Cancer (HCC)    skin    Cataract immature BILATERAL EYES   Choledocholithiasis    Coronary artery disease CARDIOLOGIST-  DR Cochran  JENNYE)---  LAST VISIT 04-29-2011   DENIES CARDIAC SYMPTOMS   Coronary artery disease involving native coronary artery of native heart without angina pectoris 10/09/2014   Luminal disease but cardiac catheterization from 2014, stress test in the  summer of 2018 showed no evidence of ischemia  Formatting of this note might be different from the original. 20% circumflex 20% RCA in July 2014   COVID-19 virus infection 02/2020   DDD (degenerative disc disease), lumbar    Dyslipidemia 10/01/2016   Dysrhythmia    A Fib    Encounter for fitting and adjustment of hearing aid 06/13/2020   Endocarditis 06/13/2020   Apr 25, 2007 Entered By: Spencer MILLION C Comment: AORTIC STENOSIS, MITRAL AND TRICUSPID REGURGITATION   Flat foot (pes planus) (acquired), unspecified foot 07/02/2021   Frequency of urination    Gait abnormality 08/03/2018   GERD (gastroesophageal reflux disease)    Glaucoma    both eyes   History of colon polyps    History of kidney stones    Hyperlipidemia 01/21/2022   Hypertension    Impacted cerumen, left ear 10/09/2020   Impaired hearing has bilateral aids--  but does not wear at all times   Intolerance of continuous positive airway pressure (CPAP) ventilation 02/02/2023   Iron deficiency anemia 03/02/2022   Kidney stone 06/13/2020   Apr 25, 2007 Entered By: Spencer MILLION C Comment: TREATED WITH LITHOTRIPSY   Left ventricular diastolic dysfunction PER ECHO [id number]  W/ CHART   Low back pain, unspecified 03/11/2021   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   Nocturia    Obesity 06/13/2020   Apr 25, 2007 Entered By: Spencer MILLION C Comment: REPAIR OF HYPOSPADIASMar 23, 2009 Entered By: Spencer Cochran Comment: APPENDECTOMYMar 23, 2009 Entered By: Spencer Cochran Comment:  LEFT ACHILLES TENDONE RUPTURE AND REPAIRMar 23, 2009 Entered By: Spencer MILLION C Comment: RIGHT INGUINAL HERNIA REPAIR     Obstructive jaundice    Obstructive sleep apnea 06/05/2019   OSA on CPAP    wears cpap   Other specified counseling 10/09/2020   Palpitations 08/10/2019   Paresthesia 07/18/2018   Polyp of colon 01/21/2022   Positive fecal occult blood test 03/02/2022   Pre-diabetes    last hemaglobin a 1 c was 5.6   S/P insertion of hypoglossal nerve  stimulator 02/02/2023   Sensorineural hearing loss, bilateral 06/13/2020   Tinnitus, bilateral 10/09/2020   Unsteadiness    Urethral stricture     Past Surgical History:  Procedure Laterality Date   APPENDECTOMY  1972   BALLOON DILATION N/A 04/13/2019   Procedure: MERRILL DILATION;  Surgeon: Charlanne Groom, MD;  Location: [facility];  Service: Endoscopy;  Laterality: N/A;   CARDIOVASCULAR STRESS TEST  04-29-2010   DR KRASAWSKI   NO EVIDENCE ISCHEMIA/ NORMAL LVSF AND WALL MOTION/ EF 63%   CERVICAL FUSION  2006   C3 - 6   COLONOSCOPY  03/23/2012   Small colonic polyps, status post polypectomy. Pancolonic diverticulosis predominantly in the left colon.Small internal and external hemorrhoids   COLONOSCOPY  2019   Dr Anette. Diverticulosis. Doesn't think he removed polpys    CYSTO/ URETHRAL DILATION/ TRANSURETHRAL INCISIONOF PROSTATE  01-13-2007   CYSTOSCOPY  2005   CYSTOSCOPY W/ RETROGRADES  06/12/2011   Procedure: CYSTOSCOPY WITH RETROGRADE PYELOGRAM;  Surgeon: Arlena LILLETTE Gal, MD;  Location: [facility];  Service: Urology;  Laterality: N/A;  cysto, urethral dilation, right retrograde pyelogram     CYSTOSCOPY WITH RETROGRADE PYELOGRAM, URETEROSCOPY AND STENT PLACEMENT Left 03/08/2014   Procedure: CYSTOSCOPY WITH LEFT RETROGRADE PYELOGRAM/LEFT  URETEROSCOPY;  Surgeon: Arlena LILLETTE Gal, MD;  Location: [facility];  Service: Urology;  Laterality: Left;   CYSTOSCOPY WITH URETHRAL DILATATION N/A 04/20/2013   Procedure: CYSTOSCOPY WITH URETHRAL DILATATION;  Surgeon: Arlena LILLETTE Gal, MD;  Location: [facility];  Service: Urology;  Laterality: N/A;   CYSTOSCOPY/URETEROSCOPY/HOLMIUM LASER/STENT PLACEMENT Left 06/10/2017   Procedure: CYSTOSCOPY/RETROGRADE/URETEROSCOPY/HOLMIUM LASER/STENT PLACEMENT;  Surgeon: Renda Glance, MD;  Location: [facility];  Service: Urology;  Laterality: Left;   CYSTOSCOPY/URETEROSCOPY/HOLMIUM LASER/STENT PLACEMENT Right 03/09/2019   Procedure:  CYSTOSCOPY/RETROGRADE/URETEROSCOPY/HOLMIUM LASER/STENT PLACEMENT;  Surgeon: Renda Glance, MD;  Location: [facility];  Service: Urology;  Laterality: Right;  ONLY NEEDS 60 MIN   ENDOSCOPIC RETROGRADE CHOLANGIOPANCREATOGRAPHY (ERCP) WITH PROPOFOL  N/A 04/13/2019   Procedure: ENDOSCOPIC RETROGRADE CHOLANGIOPANCREATOGRAPHY (ERCP) WITH PROPOFOL ;  Surgeon: Charlanne Groom, MD;  Location: [facility];  Service: Endoscopy;  Laterality: N/A;   EXTRACORPOREAL SHOCK WAVE LITHOTRIPSY  01-17-2007   LEFT   HERNIA REPAIR  01/30/2019   [facility] Left inguinal hernia repair   HOLMIUM LASER APPLICATION Left 03/08/2014   Procedure: LASER OF LEFT RENAL PELVIC STONE;  Surgeon: Arlena LILLETTE Gal, MD;  Location: [facility];  Service: Urology;  Laterality: Left;   INGUINAL HERNIA REPAIR  2003   JOINT REPLACEMENT     total knee right 03-01-17   LEFT ACHILLES TENDON REPAIR  1992   MASS EXCISION N/A 11/14/2018   Procedure: EXCISIONAL BIOPSY OF GLANS PENIS;  Surgeon: Renda Glance, MD;  Location: [facility];  Service: Urology;  Laterality: N/A;  ONLY NEEDS 60 MIN   NASAL SINUS SURGERY  2012   PENILE SURGERY  OF MEATUS  1955   REMOVAL OF STONES  04/13/2019   Procedure: REMOVAL OF STONES;  Surgeon: Charlanne,  Lynnie, MD;  Location: [facility];  Service: Endoscopy;;   SPHINCTEROTOMY  04/13/2019   Procedure: ANNETT;  Surgeon: Charlanne Lynnie, MD;  Location: [facility];  Service: Endoscopy;;   TRANSTHORACIC ECHOCARDIOGRAM  03-31-2011   NORMAL LVEF  59%/ TRIVIAL MR/ DIASTOLIC DYSFUNCTION/ MODERATE LVH   TRANSURETHRAL RESECTION OF PROSTATE N/A 04/20/2013   Procedure: TRANSURETHRAL RESECTION OF THE PROSTATE WITH GYRUS INSTRUMENTS;  Surgeon: Arlena LILLETTE Gal, MD;  Location: [facility];  Service: Urology;  Laterality: N/A;    Current Medications: Current Meds  Medication Sig   apixaban  (ELIQUIS ) 5 MG TABS tablet TAKE 1 TABLET(5 MG) BY MOUTH TWICE DAILY   atorvastatin  (LIPITOR) 20 MG tablet Take 1 tablet  (20 mg total) by mouth at bedtime. Taking 20 MG Daily   b complex vitamins capsule Take 1 capsule by mouth daily.   benazepril  (LOTENSIN ) 20 MG tablet Take 20 mg by mouth daily.   carvedilol  (COREG ) 12.5 MG tablet Take 1 tablet (12.5 mg total) by mouth 2 (two) times daily.   ferrous sulfate 325 (65 FE) MG tablet Take 325 mg by mouth daily.   furosemide  (LASIX ) 40 MG tablet Take 1 tablet (40 mg total) by mouth daily.   NON FORMULARY Take 1 Units by mouth in the morning and at bedtime. I health/macular   omeprazole (PRILOSEC) 20 MG capsule Take 20 mg by mouth daily.    oxyCODONE  (OXY IR/ROXICODONE ) 5 MG immediate release tablet Take 5 mg by mouth every 4 (four) hours as needed.   sulfamethoxazole-trimethoprim  (BACTRIM DS) 800-160 MG tablet Take 1 tablet by mouth once.   tadalafil (CIALIS) 5 MG tablet Take 5 mg by mouth daily as needed for erectile dysfunction.   timolol  (TIMOPTIC ) 0.5 % ophthalmic solution Place 1 drop into both eyes 2 (two) times daily.    traZODone (DESYREL) 50 MG tablet Take 50 mg by mouth at bedtime.   Vitamin D , Ergocalciferol , (DRISDOL) 1.25 MG (50000 UNIT) CAPS capsule Take 50,000 Units by mouth once a week.   Vitamin E (VITAMIN E/D-ALPHA NATURAL) 268 MG (400 UNIT) CAPS Take 1 capsule by mouth daily.   zinc sulfate, 50mg  elemental zinc, 220 (50 Zn) MG capsule Take 1 capsule by mouth daily.     Allergies:   Hydrocodone   Social History   Socioeconomic History   Marital status: Married    Spouse name: Not on file   Number of children: 2   Years of education: some college   Highest education level: Not on file  Occupational History   Occupation: retired Company Secretary  Tobacco Use   Smoking status: Never   Smokeless tobacco: Never  Vaping Use   Vaping status: Never Used  Substance and Sexual Activity   Alcohol  use: No   Drug use: No   Sexual activity: Yes  Other Topics Concern   Not on file  Social History Narrative   Lives at home with his wife.    Right-handed.   Caffeine use:  32-40 ounces caffeine per day.   Social Drivers of Corporate Investment Banker Strain: Not on file  Food Insecurity: Not on file  Transportation Needs: Not on file  Physical Activity: Not on file  Stress: Not on file  Social Connections: Not on file     Family History: The patient's family history includes Breast cancer in his mother; Cirrhosis in his father; Colon cancer in his maternal aunt. There is no history of Esophageal cancer. ROS:   Please see the history of present  illness.    All 14 point review of systems negative except as described per history of present illness  EKGs/Labs/Other Studies Reviewed:         Recent Labs: No results found for requested labs within last 365 days.  Recent Lipid Panel No results found for: CHOL, TRIG, HDL, CHOLHDL, VLDL, LDLCALC, LDLDIRECT  Physical Exam:    VS:  BP 132/74   Pulse 95   Ht 5' 8 (1.727 m)   Wt 193 lb 6.4 oz (87.7 kg)   SpO2 96%   BMI 29.41 kg/m     Wt Readings from Last 3 Encounters:  02/09/23 193 lb 6.4 oz (87.7 kg)  03/09/22 188 lb (85.3 kg)  08/15/21 187 lb 6.4 oz (85 kg)     GEN:  Well nourished, well developed in no acute distress HEENT: Normal NECK: No JVD; No carotid bruits LYMPHATICS: No lymphadenopathy CARDIAC: Irregularly irregular, no murmurs, no rubs, no gallops RESPIRATORY:  Clear to auscultation without rales, wheezing or rhonchi  ABDOMEN: Soft, non-tender, non-distended MUSCULOSKELETAL:  No edema; No deformity  SKIN: Warm and dry LOWER EXTREMITIES: no swelling NEUROLOGIC:  Alert and oriented x 3 PSYCHIATRIC:  Normal affect   ASSESSMENT:    1. Permanent atrial fibrillation (HCC)   2. Coronary artery disease involving native coronary artery of native heart without angina pectoris   3. Obstructive sleep apnea   4. Dyslipidemia   5. Pre-diabetes   6. S/P insertion of hypoglossal nerve stimulator    PLAN:    In order of problems listed  above:  Permanent atrial fibrillation, rate controlled, anticoagulated with Eliquis  which I will continue Coronary disease stable denies have any symptoms continue present management. Obstructive sleep apnea treated with this.  Device.  Happy and satisfied results of it. Dyslipidemia followed by primary care physician I did review his last fasting lipid profile from November of last year LDL 51 HDL 38 continue present management. Prediabetes: Followed by internal medicine team   Medication Adjustments/Labs and Tests Ordered: Current medicines are reviewed at length with the patient today.  Concerns regarding medicines are outlined above.  No orders of the defined types were placed in this encounter.  Medication changes: No orders of the defined types were placed in this encounter.   Signed, Spencer DOROTHA Fitch, MD, [facility] 02/09/2023 1:30 PM    [facility]

## 2023-02-09 NOTE — Patient Instructions (Signed)

## 2023-02-16 ENCOUNTER — Other Ambulatory Visit: Payer: Self-pay | Admitting: Cardiology

## 2023-02-16 NOTE — Telephone Encounter (Signed)
 Rx refill sent to pharmacy.

## 2023-02-19 ENCOUNTER — Other Ambulatory Visit: Payer: Self-pay | Admitting: Cardiology

## 2023-03-06 DIAGNOSIS — E782 Mixed hyperlipidemia: Secondary | ICD-10-CM | POA: Diagnosis not present

## 2023-03-06 DIAGNOSIS — I509 Heart failure, unspecified: Secondary | ICD-10-CM | POA: Diagnosis not present

## 2023-03-09 DIAGNOSIS — M19012 Primary osteoarthritis, left shoulder: Secondary | ICD-10-CM | POA: Diagnosis not present

## 2023-03-10 ENCOUNTER — Other Ambulatory Visit: Payer: Self-pay

## 2023-03-10 ENCOUNTER — Telehealth: Payer: Self-pay | Admitting: Cardiology

## 2023-03-10 MED ORDER — BENAZEPRIL HCL 20 MG PO TABS: 20.0000 mg | ORAL_TABLET | Freq: Every day | ORAL | 3 refills | Status: DC

## 2023-03-10 NOTE — Telephone Encounter (Signed)
Benazepril 20mg  1 tablet daily #90 ref x 3 Sent to [facility]

## 2023-03-10 NOTE — Telephone Encounter (Signed)
*  STAT* If patient is at the pharmacy, call can be transferred to refill team.   1. Which medications need to be refilled? (please list name of each medication and dose if known)   benazepril  (LOTENSIN ) 20 MG tablet     2. Would you like to learn more about the convenience, safety, & potential cost savings by using the [facility]? No   3. Are you open to using the [facility] (Type [facility].) No   4. Which pharmacy/location (including street and city if local pharmacy) is medication to be sent to? [facility] [phone] - [place], [place] - [address]    5. Do they need a 30 day or 90 day supply? 90 day

## 2023-03-12 MED ORDER — BENAZEPRIL HCL 20 MG PO TABS: 20.0000 mg | ORAL_TABLET | Freq: Every day | ORAL | 3 refills | Status: DC

## 2023-03-12 NOTE — Telephone Encounter (Signed)
 Medication sent as instructed  Krasowski, Robert J, MD  You18 hours ago (4:32 PM)    Yes, it is fine to refill it    You  Krasowski, Robert J, MD2 days ago    Patient is requesting this medication but it was d/c(see below): Advise if ok to send.   benazepril  (LOTENSIN ) 20 MG tablet [[id number]]  DISCONTINUED   Order Details Dose: 20 mg Route: Oral Frequency: Daily Dispense Quantity: 90 tablet Refills: 2   Sig: Take 1 tablet (20 mg total) by mouth daily.   Start Date: 04/13/22 End Date: 06/10/22 Discontinued by: Tamea Edsel SAUNDERS on 06/10/2022 11:25 Reason: Completed Course   Written Date: 04/13/22 Expiration Date: 04/13/23 Original Order: benazepril  (LOTENSIN ) 20 MG tablet [[id number]

## 2023-03-24 DIAGNOSIS — N401 Enlarged prostate with lower urinary tract symptoms: Secondary | ICD-10-CM | POA: Diagnosis not present

## 2023-03-24 DIAGNOSIS — Z125 Encounter for screening for malignant neoplasm of prostate: Secondary | ICD-10-CM | POA: Diagnosis not present

## 2023-03-24 DIAGNOSIS — N529 Male erectile dysfunction, unspecified: Secondary | ICD-10-CM | POA: Diagnosis not present

## 2023-03-24 DIAGNOSIS — R310 Gross hematuria: Secondary | ICD-10-CM | POA: Diagnosis not present

## 2023-04-03 DIAGNOSIS — E782 Mixed hyperlipidemia: Secondary | ICD-10-CM | POA: Diagnosis not present

## 2023-04-03 DIAGNOSIS — I509 Heart failure, unspecified: Secondary | ICD-10-CM | POA: Diagnosis not present

## 2023-04-05 DIAGNOSIS — E782 Mixed hyperlipidemia: Secondary | ICD-10-CM | POA: Diagnosis not present

## 2023-04-05 DIAGNOSIS — R7303 Prediabetes: Secondary | ICD-10-CM | POA: Diagnosis not present

## 2023-04-06 DIAGNOSIS — R310 Gross hematuria: Secondary | ICD-10-CM | POA: Diagnosis not present

## 2023-04-06 DIAGNOSIS — N200 Calculus of kidney: Secondary | ICD-10-CM | POA: Diagnosis not present

## 2023-04-07 DIAGNOSIS — N200 Calculus of kidney: Secondary | ICD-10-CM | POA: Diagnosis not present

## 2023-04-12 DIAGNOSIS — Z1389 Encounter for screening for other disorder: Secondary | ICD-10-CM | POA: Diagnosis not present

## 2023-04-12 DIAGNOSIS — R7303 Prediabetes: Secondary | ICD-10-CM | POA: Diagnosis not present

## 2023-04-12 DIAGNOSIS — Z1331 Encounter for screening for depression: Secondary | ICD-10-CM | POA: Diagnosis not present

## 2023-04-12 DIAGNOSIS — Z1339 Encounter for screening examination for other mental health and behavioral disorders: Secondary | ICD-10-CM | POA: Diagnosis not present

## 2023-04-12 DIAGNOSIS — Z0000 Encounter for general adult medical examination without abnormal findings: Secondary | ICD-10-CM | POA: Diagnosis not present

## 2023-04-12 DIAGNOSIS — E782 Mixed hyperlipidemia: Secondary | ICD-10-CM | POA: Diagnosis not present

## 2023-04-12 DIAGNOSIS — Z6828 Body mass index (BMI) 28.0-28.9, adult: Secondary | ICD-10-CM | POA: Diagnosis not present

## 2023-04-12 DIAGNOSIS — Z139 Encounter for screening, unspecified: Secondary | ICD-10-CM | POA: Diagnosis not present

## 2023-04-12 DIAGNOSIS — I10 Essential (primary) hypertension: Secondary | ICD-10-CM | POA: Diagnosis not present

## 2023-04-26 DIAGNOSIS — T148XXA Other injury of unspecified body region, initial encounter: Secondary | ICD-10-CM | POA: Diagnosis not present

## 2023-04-26 DIAGNOSIS — L570 Actinic keratosis: Secondary | ICD-10-CM | POA: Diagnosis not present

## 2023-04-26 DIAGNOSIS — L821 Other seborrheic keratosis: Secondary | ICD-10-CM | POA: Diagnosis not present

## 2023-04-26 DIAGNOSIS — R233 Spontaneous ecchymoses: Secondary | ICD-10-CM | POA: Diagnosis not present

## 2023-04-26 DIAGNOSIS — L578 Other skin changes due to chronic exposure to nonionizing radiation: Secondary | ICD-10-CM | POA: Diagnosis not present

## 2023-05-04 DIAGNOSIS — K2100 Gastro-esophageal reflux disease with esophagitis, without bleeding: Secondary | ICD-10-CM | POA: Diagnosis not present

## 2023-05-04 DIAGNOSIS — R1032 Left lower quadrant pain: Secondary | ICD-10-CM | POA: Diagnosis not present

## 2023-05-13 DIAGNOSIS — R310 Gross hematuria: Secondary | ICD-10-CM | POA: Diagnosis not present

## 2023-05-13 DIAGNOSIS — N401 Enlarged prostate with lower urinary tract symptoms: Secondary | ICD-10-CM | POA: Diagnosis not present

## 2023-05-13 DIAGNOSIS — Z125 Encounter for screening for malignant neoplasm of prostate: Secondary | ICD-10-CM | POA: Diagnosis not present

## 2023-05-17 ENCOUNTER — Other Ambulatory Visit: Payer: Self-pay | Admitting: Cardiology

## 2023-05-17 DIAGNOSIS — I4821 Permanent atrial fibrillation: Secondary | ICD-10-CM

## 2023-05-17 NOTE — Telephone Encounter (Signed)
 Prescription refill request for Eliquis received. Indication:afib Last office visit:1/25 Scr:0.9  3/25 Age: 78 Weight:87.7  kg  Prescription refilled

## 2023-05-18 ENCOUNTER — Other Ambulatory Visit: Payer: Self-pay | Admitting: Cardiology

## 2023-07-27 DIAGNOSIS — Z9682 Presence of neurostimulator: Secondary | ICD-10-CM | POA: Diagnosis not present

## 2023-07-27 DIAGNOSIS — Z789 Other specified health status: Secondary | ICD-10-CM | POA: Diagnosis not present

## 2023-07-27 DIAGNOSIS — I519 Heart disease, unspecified: Secondary | ICD-10-CM | POA: Diagnosis not present

## 2023-07-27 DIAGNOSIS — I482 Chronic atrial fibrillation, unspecified: Secondary | ICD-10-CM | POA: Diagnosis not present

## 2023-07-27 DIAGNOSIS — G4733 Obstructive sleep apnea (adult) (pediatric): Secondary | ICD-10-CM | POA: Diagnosis not present

## 2023-08-10 ENCOUNTER — Other Ambulatory Visit: Payer: Self-pay

## 2023-08-11 ENCOUNTER — Encounter: Payer: Self-pay | Admitting: Cardiology

## 2023-08-11 ENCOUNTER — Ambulatory Visit: Attending: Cardiology | Admitting: Cardiology

## 2023-08-11 ENCOUNTER — Ambulatory Visit

## 2023-08-11 VITALS — BP 114/80 | HR 90 | Ht 68.0 in | Wt 182.0 lb

## 2023-08-11 DIAGNOSIS — I2510 Atherosclerotic heart disease of native coronary artery without angina pectoris: Secondary | ICD-10-CM | POA: Diagnosis not present

## 2023-08-11 DIAGNOSIS — I4821 Permanent atrial fibrillation: Secondary | ICD-10-CM | POA: Diagnosis not present

## 2023-08-11 DIAGNOSIS — I10 Essential (primary) hypertension: Secondary | ICD-10-CM | POA: Diagnosis not present

## 2023-08-11 DIAGNOSIS — E785 Hyperlipidemia, unspecified: Secondary | ICD-10-CM | POA: Diagnosis not present

## 2023-08-11 MED ORDER — CARVEDILOL 12.5 MG PO TABS: 12.5000 mg | ORAL_TABLET | Freq: Three times a day (TID) | ORAL | 3 refills | Status: DC

## 2023-08-11 NOTE — Addendum Note (Signed)
 Addended by: ONEITA BERLINER on: 08/11/2023 10:17 AM   Modules accepted: Orders

## 2023-08-11 NOTE — Patient Instructions (Signed)
 Medication Instructions:  Your physician has recommended you make the following change in your medication:   Increase your Carvedilol  to 3 times daily  *If you need a refill on your cardiac medications before your next appointment, please call your pharmacy*   Lab Work: None ordered If you have labs (blood work) drawn today and your tests are completely normal, you will receive your results only by: MyChart Message (if you have MyChart) OR A paper copy in the mail If you have any lab test that is abnormal or we need to change your treatment, we will call you to review the results.  Testing/Procedures: Your physician has requested that you have an echocardiogram in 3 months. Echocardiography is a painless test that uses sound waves to create images of your heart. It provides your doctor with information about the size and shape of your heart and how well your heart's chambers and valves are working. This procedure takes approximately one hour. There are no restrictions for this procedure. Please do NOT wear cologne, perfume, aftershave, or lotions (deodorant is allowed). Please arrive 15 minutes prior to your appointment time.  Please note: We ask at that you not bring children with you during ultrasound (echo/ vascular) testing. Due to room size and safety concerns, children are not allowed in the ultrasound rooms during exams. Our front office staff cannot provide observation of children in our lobby area while testing is being conducted. An adult accompanying a patient to their appointment will only be allowed in the ultrasound room at the discretion of the ultrasound technician under special circumstances. We apologize for any inconvenience.  A zio monitor was ordered today. It will remain on for 14 days. Remove 08/25/23. You will then return monitor and event diary in provided box. It takes 1-2 weeks for report to be downloaded and returned to us . We will call you with the results. If  monitor falls off or has orange flashing light, please call Zio for further instructions.  Follow-Up: At [facility], you and your health needs are our priority.  As part of our continuing mission to provide you with exceptional heart care, we have created designated Provider Care Teams.  These Care Teams include your primary Cardiologist (physician) and Advanced Practice Providers (APPs -  Physician Assistants and Nurse Practitioners) who all work together to provide you with the care you need, when you need it.  We recommend signing up for the patient portal called MyChart.  Sign up information is provided on this After Visit Summary.  MyChart is used to connect with patients for Virtual Visits (Telemedicine).  Patients are able to view lab/test results, encounter notes, upcoming appointments, etc.  Non-urgent messages can be sent to your provider as well.   To learn more about what you can do with MyChart, go to [email or web address].    Your next appointment:   3 month(s)  The format for your next appointment:   In Person  Provider:   Lamar Fitch, MD   Other Instructions Echocardiogram An echocardiogram is a test that uses sound waves (ultrasound) to produce images of the heart. Images from an echocardiogram can provide important information about: Heart size and shape. The size and thickness and movement of your heart's walls. Heart muscle function and strength. Heart valve function or if you have stenosis. Stenosis is when the heart valves are too narrow. If blood is flowing backward through the heart valves (regurgitation). A tumor or infectious growth around the heart valves. Areas of heart  muscle that are not working well because of poor blood flow or injury from a heart attack. Aneurysm detection. An aneurysm is a weak or damaged part of an artery wall. The wall bulges out from the normal force of blood pumping through the body. Tell a health care provider  about: Any allergies you have. All medicines you are taking, including vitamins, herbs, eye drops, creams, and over-the-counter medicines. Any blood disorders you have. Any surgeries you have had. Any medical conditions you have. Whether you are pregnant or may be pregnant. What are the risks? Generally, this is a safe test. However, problems may occur, including an allergic reaction to dye (contrast) that may be used during the test. What happens before the test? No specific preparation is needed. You may eat and drink normally. What happens during the test? You will take off your clothes from the waist up and put on a hospital gown. Electrodes or electrocardiogram (ECG)patches may be placed on your chest. The electrodes or patches are then connected to a device that monitors your heart rate and rhythm. You will lie down on a table for an ultrasound exam. A gel will be applied to your chest to help sound waves pass through your skin. A handheld device, called a transducer, will be pressed against your chest and moved over your heart. The transducer produces sound waves that travel to your heart and bounce back (or echo back) to the transducer. These sound waves will be captured in real-time and changed into images of your heart that can be viewed on a video monitor. The images will be recorded on a computer and reviewed by your health care provider. You may be asked to change positions or hold your breath for a short time. This makes it easier to get different views or better views of your heart. In some cases, you may receive contrast through an IV in one of your veins. This can improve the quality of the pictures from your heart. The procedure may vary among health care providers and hospitals.   What can I expect after the test? You may return to your normal, everyday life, including diet, activities, and medicines, unless your health care provider tells you not to do that. Follow these  instructions at home: It is up to you to get the results of your test. Ask your health care provider, or the department that is doing the test, when your results will be ready. Keep all follow-up visits. This is important. Summary An echocardiogram is a test that uses sound waves (ultrasound) to produce images of the heart. Images from an echocardiogram can provide important information about the size and shape of your heart, heart muscle function, heart valve function, and other possible heart problems. You do not need to do anything to prepare before this test. You may eat and drink normally. After the echocardiogram is completed, you may return to your normal, everyday life, unless your health care provider tells you not to do that. This information is not intended to replace advice given to you by your health care provider. Make sure you discuss any questions you have with your health care provider. Document Revised: 09/12/2019 Document Reviewed: 09/12/2019 Elsevier Patient Education  2021 Elsevier Inc.   Important Information About Sugar

## 2023-08-11 NOTE — Progress Notes (Signed)
 Cardiology Office Note:    Date:  08/11/2023   ID:  Spencer Cochran, DOB 09-Jun-1945, MRN [id number]  PCP:  Trinidad Glisson, MD  Cardiologist:  Lamar Fitch, MD    Referring MD: Trinidad Glisson, MD   Chief Complaint  Patient presents with   Follow-up    History of Present Illness:    Spencer Cochran is a 78 y.o. male past medical history significant for coronary artery disease, luminal based on cardiac catheterization 3 years ago, stress test negative, permanent atrial fibrillation, anticoagulated, diastolic dysfunction, obstructive sleep apnea.  Comes today to months for follow-up not doing well he described the fact that his heart rate seems to be elevated on top of that he does not have much energy.  Still can do what he wants but stopped going to the gym that make me worry he was always very religious about going to the gym.  No dizziness no passing out except sometimes when he gets up very quickly if he gets dizzy  Past Medical History:  Diagnosis Date   Arthritis knees and ankle   Asymptomatic gallstones    Atrial fibrillation (HCC) 03/08/2014   Benign neoplasm of colon 06/13/2020   Benign prostatic hyperplasia with incomplete bladder emptying 04/20/2013   IMO SNOMED Dx Update Oct 2024     Biliary obstruction    Blood in urine 06/13/2020   Cancer (HCC)    skin    Choledocholithiasis    Coronary artery disease CARDIOLOGIST-  DR FITCH  JENNYE)---  LAST VISIT 04-29-2011   DENIES CARDIAC SYMPTOMS   Coronary artery disease involving native coronary artery of native heart without angina pectoris 10/09/2014   Luminal disease but cardiac catheterization from 2014, stress test in the summer of 2018 showed no evidence of ischemia  Formatting of this note might be different from the original. 20% circumflex 20% RCA in July 2014   COVID-19 virus infection 02/2020   DDD (degenerative disc disease), lumbar    Dyslipidemia 10/01/2016   Dysrhythmia    A Fib    Encounter for  fitting and adjustment of hearing aid 06/13/2020   Endocarditis 06/13/2020   Apr 25, 2007 Entered By: SAVANNAH MILLION C Comment: AORTIC STENOSIS, MITRAL AND TRICUSPID REGURGITATION   Flat foot (pes planus) (acquired), unspecified foot 07/02/2021   Frequency of urination    Gait abnormality 08/03/2018   GERD (gastroesophageal reflux disease)    History of colon polyps    History of kidney stones    Hyperlipidemia 01/21/2022   Hypertension    Impacted cerumen, left ear 10/09/2020   Impaired hearing has bilateral aids--  but does not wear at all times   Intolerance of continuous positive airway pressure (CPAP) ventilation 02/02/2023   Iron deficiency anemia 03/02/2022   Kidney stone 06/13/2020   Apr 25, 2007 Entered By: SAVANNAH MILLION C Comment: TREATED WITH LITHOTRIPSY   Left ventricular diastolic dysfunction PER ECHO [id number]  W/ CHART   Low back pain, unspecified 03/11/2021   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   Nocturia    Obstructive jaundice    Obstructive sleep apnea 06/05/2019   Other specified counseling 10/09/2020   Palpitations 08/10/2019   Paresthesia 07/18/2018   Polyp of colon 01/21/2022   Positive fecal occult blood test 03/02/2022   Pre-diabetes    last hemaglobin a 1 c was 5.6   S/P insertion of hypoglossal nerve stimulator 02/02/2023   Sensorineural hearing loss, 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 Pancolonic diverticulosis predominantly in the left colon.Small internal and external  hemorrhoids   COLONOSCOPY  2019   Dr Anette. Diverticulosis. Doesn't think he removed polpys    CYSTO/ URETHRAL DILATION/ TRANSURETHRAL INCISIONOF PROSTATE  01-13-2007   CYSTOSCOPY  2005   CYSTOSCOPY W/ RETROGRADES  06/12/2011   Procedure: CYSTOSCOPY WITH RETROGRADE PYELOGRAM;  Surgeon: Arlena LILLETTE Gal, MD;  Location: [facility];  Service: Urology;  Laterality: N/A;  cysto, urethral dilation, right retrograde pyelogram     CYSTOSCOPY WITH RETROGRADE PYELOGRAM, URETEROSCOPY AND STENT PLACEMENT Left 03/08/2014   Procedure: CYSTOSCOPY WITH LEFT RETROGRADE PYELOGRAM/LEFT  URETEROSCOPY;  Surgeon: Arlena LILLETTE Gal, MD;  Location: [facility];  Service: Urology;  Laterality: Left;   CYSTOSCOPY WITH URETHRAL DILATATION N/A 04/20/2013   Procedure: CYSTOSCOPY WITH URETHRAL DILATATION;  Surgeon: Arlena LILLETTE Gal, MD;  Location: [facility];  Service: Urology;  Laterality: N/A;   CYSTOSCOPY/URETEROSCOPY/HOLMIUM LASER/STENT PLACEMENT Left 06/10/2017   Procedure: CYSTOSCOPY/RETROGRADE/URETEROSCOPY/HOLMIUM LASER/STENT PLACEMENT;  Surgeon: Renda Glance, MD;  Location: [facility];  Service: Urology;  Laterality: Left;   CYSTOSCOPY/URETEROSCOPY/HOLMIUM LASER/STENT PLACEMENT Right 03/09/2019   Procedure: CYSTOSCOPY/RETROGRADE/URETEROSCOPY/HOLMIUM LASER/STENT PLACEMENT;  Surgeon: Renda Glance, MD;  Location[facility]  Service: Urology;  Laterality: Right;  ONLY NEEDS 60 MIN   ENDOSCOPIC RETROGRADE CHOLANGIOPANCREATOGRAPHY (ERCP) WITH PROPOFOL  N/A 04/13/2019   Procedure: ENDOSCOPIC RETROGRADE CHOLANGIOPANCREATOGRAPHY (ERCP) WITH PROPOFOL ;  Surgeon: Charlanne Groom, MD;  Location: [facility];  Service: Endoscopy;  Laterality: N/A;   EXTRACORPOREAL SHOCK WAVE LITHOTRIPSY  01-17-2007   LEFT   HERNIA REPAIR  01/30/2019   [facility] Left inguinal hernia repair   HOLMIUM LASER APPLICATION Left 03/08/2014   Procedure: LASER OF LEFT RENAL PELVIC STONE;  Surgeon: Arlena LILLETTE Gal, MD;   Location: [facility];  Service: Urology;  Laterality: Left;   INGUINAL HERNIA REPAIR  2003   JOINT REPLACEMENT     total knee right 03-01-17   LEFT ACHILLES TENDON REPAIR  1992   MASS EXCISION N/A 11/14/2018   Procedure: EXCISIONAL BIOPSY OF GLANS PENIS;  Surgeon: Renda Glance, MD;  Location: [facility];  Service: Urology;  Laterality: N/A;  ONLY NEEDS 60 MIN   NASAL SINUS SURGERY  2012   PENILE SURGERY  OF MEATUS  1955   REMOVAL OF STONES  04/13/2019   Procedure: REMOVAL OF STONES;  Surgeon: Charlanne Groom, MD;  Location: [facility];  Service: Endoscopy;;   SPHINCTEROTOMY  04/13/2019   Procedure: ANNETT;  Surgeon: Charlanne Groom, MD;  Location: [facility];  Service: Endoscopy;;   TRANSTHORACIC ECHOCARDIOGRAM  03-31-2011   NORMAL LVEF  59%/ TRIVIAL MR/ DIASTOLIC DYSFUNCTION/ MODERATE LVH   TRANSURETHRAL RESECTION OF PROSTATE N/A 04/20/2013   Procedure: TRANSURETHRAL RESECTION OF THE PROSTATE WITH GYRUS INSTRUMENTS;  Surgeon: Arlena LILLETTE Gal, MD;  Location: [facility];  Service: Urology;  Laterality: N/A;    Current Medications: Current Meds  Medication Sig   atorvastatin  (LIPITOR) 20 MG tablet Take 1 tablet (20 mg total) by mouth at bedtime. Taking 20 MG Daily   b complex  vitamins capsule Take 1 capsule by mouth daily.   benazepril  (LOTENSIN ) 20 MG tablet Take 1 tablet (20 mg total) by mouth daily.   carvedilol  (COREG ) 12.5 MG tablet Take 1 tablet (12.5 mg total) by mouth 2 (two) times daily with a meal.   ELIQUIS  5 MG TABS tablet TAKE 1 TABLET(5 MG) BY MOUTH TWICE DAILY   ferrous sulfate 325 (65 FE) MG tablet Take 325 mg by mouth daily.   furosemide  (LASIX ) 40 MG tablet Take 1 tablet (40 mg total) by mouth daily.   NON FORMULARY Take 1 Units by mouth in the morning and at bedtime. I health/macular   omeprazole (PRILOSEC) 20 MG capsule Take 20 mg by mouth daily.    tadalafil (CIALIS) 5 MG tablet Take 5 mg by mouth daily as needed for erectile dysfunction.   tamsulosin (FLOMAX) 0.4 MG  CAPS capsule Take 0.4 mg by mouth daily.   timolol  (TIMOPTIC ) 0.5 % ophthalmic solution Place 1 drop into both eyes 2 (two) times daily.    traZODone (DESYREL) 50 MG tablet Take 50 mg by mouth at bedtime.   Vitamin E (VITAMIN E/D-ALPHA NATURAL) 268 MG (400 UNIT) CAPS Take 1 capsule by mouth daily.   zinc sulfate, 50mg  elemental zinc, 220 (50 Zn) MG capsule Take 1 capsule by mouth daily.     Allergies:   Hydrocodone   Social History   Socioeconomic History   Marital status: Married    Spouse name: Not on file   Number of children: 2   Years of education: some college   Highest education level: Not on file  Occupational History   Occupation: retired Company secretary  Tobacco Use   Smoking status: Never   Smokeless tobacco: Never  Vaping Use   Vaping status: Never Used  Substance and Sexual Activity   Alcohol  use: No   Drug use: No   Sexual activity: Yes  Other Topics Concern   Not on file  Social History Narrative   Lives at home with his wife.   Right-handed.   Caffeine use:  32-40 ounces caffeine per day.   Social Drivers of Corporate investment banker Strain: Not on file  Food Insecurity: Not on file  Transportation Needs: Not on file  Physical Activity: Not on file  Stress: Not on file  Social Connections: Not on file     Family History: The patient's family history includes Breast cancer in his mother; Cirrhosis in his father; Colon cancer in his maternal aunt. There is no history of Esophageal cancer. ROS:   Please see the history of present illness.    All 14 point review of systems negative except as described per history of present illness  EKGs/Labs/Other Studies Reviewed:    EKG Interpretation Date/Time:  Wednesday August 11 2023 09:39:13 EDT Ventricular Rate:  90 PR Interval:    QRS Duration:  124 QT Interval:  370 QTC Calculation: 452 R Axis:   -49  Text Interpretation: Atrial fibrillation with premature ventricular or aberrantly conducted complexes  Left anterior fascicular block Left ventricular hypertrophy with QRS widening and repolarization abnormality ( R in aVL , Cornell product , Romhilt-Estes ) When compared with ECG of 07-Nov-2018 11:17, QRS duration has increased QRS voltage has increased Nonspecific T wave abnormality no longer evident in Inferior leads T wave inversion now evident in Lateral leads Confirmed by Bernie Charleston [phone]) on 08/11/2023 9:46:48 AM    Recent Labs: No results found for requested labs within last 365 days.  Recent Lipid Panel No results found for: CHOL, TRIG, HDL, CHOLHDL, VLDL, LDLCALC, LDLDIRECT  Physical Exam:    VS:  BP 114/80   Pulse 90   Ht 5' 8 (1.727 m)   Wt 182 lb (82.6 kg)   SpO2 97%   BMI 27.67 kg/m     Wt Readings from Last 3 Encounters:  08/11/23 182 lb (82.6 kg)  02/09/23 193 lb 6.4 oz (87.7 kg)  03/09/22 188 lb (85.3 kg)     GEN:  Well nourished, well developed in no acute distress HEENT: Normal NECK: No JVD; No carotid bruits LYMPHATICS: No lymphadenopathy CARDIAC: Regularly irregular, no murmurs, no rubs, no gallops RESPIRATORY:  Clear to auscultation without rales, wheezing or rhonchi  ABDOMEN: Soft, non-tender, non-distended MUSCULOSKELETAL:  No edema; No deformity  SKIN: Warm and dry LOWER EXTREMITIES: no swelling NEUROLOGIC:  Alert and oriented x 3 PSYCHIATRIC:  Normal affect   ASSESSMENT:    1. Permanent atrial fibrillation (HCC)   2. Coronary artery disease involving native coronary artery of native heart without angina pectoris   3. Primary hypertension   4. Dyslipidemia    PLAN:    In order of problems listed above:  Permanent atrial fibrillation: Anticoagulated which I will continue.  Rate seems to be excessive asking to increase dose of carvedilol  from 12.5 twice daily to 3 times a day, will put Zio patch on him for 1 week to see heart rate variability. Fatigue tiredness.  Will schedule him to have echocardiogram to assess left  ventricle ejection fraction. Essential hypertension blood pressure seems to be controlled continue present management. Dyslipidemia I did review K PN and K PN show me total cholesterol 95 with LDL of 51 we will continue present management   Medication Adjustments/Labs and Tests Ordered: Current medicines are reviewed at length with the patient today.  Concerns regarding medicines are outlined above.  Orders Placed This Encounter  Procedures   EKG 12-Lead   Medication changes: No orders of the defined types were placed in this encounter.   Signed, Lamar DOROTHA Fitch, MD, [facility] 08/11/2023 9:56 AM    [facility]

## 2023-09-01 DIAGNOSIS — I4821 Permanent atrial fibrillation: Secondary | ICD-10-CM | POA: Diagnosis not present

## 2023-09-07 DIAGNOSIS — M19012 Primary osteoarthritis, left shoulder: Secondary | ICD-10-CM | POA: Diagnosis not present

## 2023-09-08 DIAGNOSIS — S0990XA Unspecified injury of head, initial encounter: Secondary | ICD-10-CM | POA: Diagnosis not present

## 2023-09-08 DIAGNOSIS — R519 Headache, unspecified: Secondary | ICD-10-CM | POA: Diagnosis not present

## 2023-09-08 DIAGNOSIS — S0003XA Contusion of scalp, initial encounter: Secondary | ICD-10-CM | POA: Diagnosis not present

## 2023-09-08 DIAGNOSIS — E860 Dehydration: Secondary | ICD-10-CM | POA: Diagnosis not present

## 2023-09-08 DIAGNOSIS — W19XXXA Unspecified fall, initial encounter: Secondary | ICD-10-CM | POA: Diagnosis not present

## 2023-09-08 DIAGNOSIS — S060X1A Concussion with loss of consciousness of 30 minutes or less, initial encounter: Secondary | ICD-10-CM | POA: Diagnosis not present

## 2023-09-08 DIAGNOSIS — S199XXA Unspecified injury of neck, initial encounter: Secondary | ICD-10-CM | POA: Diagnosis not present

## 2023-09-14 ENCOUNTER — Ambulatory Visit: Payer: Self-pay | Admitting: Cardiology

## 2023-09-14 DIAGNOSIS — I519 Heart disease, unspecified: Secondary | ICD-10-CM

## 2023-09-14 DIAGNOSIS — I4821 Permanent atrial fibrillation: Secondary | ICD-10-CM | POA: Diagnosis not present

## 2023-09-27 ENCOUNTER — Encounter: Payer: Self-pay | Admitting: Podiatry

## 2023-09-27 ENCOUNTER — Ambulatory Visit (INDEPENDENT_AMBULATORY_CARE_PROVIDER_SITE_OTHER): Admitting: Podiatry

## 2023-09-27 DIAGNOSIS — I739 Peripheral vascular disease, unspecified: Secondary | ICD-10-CM | POA: Diagnosis not present

## 2023-09-27 DIAGNOSIS — B351 Tinea unguium: Secondary | ICD-10-CM | POA: Diagnosis not present

## 2023-09-27 DIAGNOSIS — D689 Coagulation defect, unspecified: Secondary | ICD-10-CM | POA: Diagnosis not present

## 2023-09-27 DIAGNOSIS — M79675 Pain in left toe(s): Secondary | ICD-10-CM

## 2023-09-27 DIAGNOSIS — M79674 Pain in right toe(s): Secondary | ICD-10-CM

## 2023-09-27 NOTE — Progress Notes (Unsigned)
nails

## 2023-09-30 ENCOUNTER — Telehealth: Payer: Self-pay | Admitting: Cardiology

## 2023-09-30 NOTE — Telephone Encounter (Signed)
 Pt c/o BP issue: STAT if pt c/o blurred vision, one-sided weakness or slurred speech.  STAT if BP is GREATER than 180/120 TODAY.  STAT if BP is LESS than 90/60 and SYMPTOMATIC TODAY  1. What is your BP concern? BP is elevated   2. Have you taken any BP medication today?Yes  3. What are your last 5 BP readings?145/101 147/91 142/80 142/88 145/94  4. Are you having any other symptoms (ex. Dizziness, headache, blurred vision, passed out)? Dizzy when standing up at times

## 2023-09-30 NOTE — Telephone Encounter (Signed)
 Spoke with pt. He will be [facility] tomorrow. Encouraged him to have his orthostatic BP readings and have his head checked while he is there. He verbalized understanding and had no further questions.

## 2023-09-30 NOTE — Telephone Encounter (Signed)
 Pt called back to add that he fell and hit back of head on hard surface 3 weeks ago; went to ER and had mild concussion with internal bleeding - still a lot of bruising

## 2023-10-04 ENCOUNTER — Telehealth: Payer: Self-pay | Admitting: Cardiology

## 2023-10-04 NOTE — Telephone Encounter (Signed)
 Outpatient service line:  Patient called reporting concerns of blood pressure with systolics in the 140s.  No accompanying symptoms of chest pain, lightheadedness or shortness of breath.  He was concerned because he said he was 62 and had a recent fall.  I asked patient to log his blood pressure once a day for the next week or so and to send readings to primary cardiologist to adjust medications if needed.

## 2023-10-05 DIAGNOSIS — R7303 Prediabetes: Secondary | ICD-10-CM | POA: Diagnosis not present

## 2023-10-05 DIAGNOSIS — E782 Mixed hyperlipidemia: Secondary | ICD-10-CM | POA: Diagnosis not present

## 2023-10-20 ENCOUNTER — Telehealth: Payer: Self-pay

## 2023-10-20 DIAGNOSIS — I472 Ventricular tachycardia, unspecified: Secondary | ICD-10-CM

## 2023-10-20 NOTE — Telephone Encounter (Signed)
 Left message on My Chart with monitor results per Dr. Karry note. Routed to PCP

## 2023-10-22 ENCOUNTER — Telehealth: Payer: Self-pay | Admitting: Cardiology

## 2023-10-22 DIAGNOSIS — R7303 Prediabetes: Secondary | ICD-10-CM | POA: Diagnosis not present

## 2023-10-22 DIAGNOSIS — Z6827 Body mass index (BMI) 27.0-27.9, adult: Secondary | ICD-10-CM | POA: Diagnosis not present

## 2023-10-22 DIAGNOSIS — I10 Essential (primary) hypertension: Secondary | ICD-10-CM | POA: Diagnosis not present

## 2023-10-22 DIAGNOSIS — E782 Mixed hyperlipidemia: Secondary | ICD-10-CM | POA: Diagnosis not present

## 2023-10-22 DIAGNOSIS — I482 Chronic atrial fibrillation, unspecified: Secondary | ICD-10-CM | POA: Diagnosis not present

## 2023-10-22 DIAGNOSIS — Z23 Encounter for immunization: Secondary | ICD-10-CM | POA: Diagnosis not present

## 2023-10-22 DIAGNOSIS — Z139 Encounter for screening, unspecified: Secondary | ICD-10-CM | POA: Diagnosis not present

## 2023-10-22 NOTE — Telephone Encounter (Signed)
 Dr Trinidad PA  called in and would like for Dr MARLA. To give him a call about this patient.  She stated that Dr MARLA should have his number and would not give me contact info.

## 2023-10-22 NOTE — Telephone Encounter (Signed)
 Dr. Trinidad office is aware that Dr. Bernie is out of the office today and ask that he give him a callback upon your return.

## 2023-11-03 NOTE — Progress Notes (Signed)
 Electrophysiology Office Note:   Date:  11/08/2023  ID:  Spencer Cochran, DOB 14-Sep-1945, MRN [id number]  Primary Cardiologist: Lamar Fitch, MD Primary Heart Failure: None Electrophysiologist: None      History of Present Illness:   Spencer Cochran is a 78 y.o. male with h/o coronary artery disease, atrial fibrillation, sleep apnea seen today for  for Electrophysiology evaluation of nonsustained VT at the request of Lamar Fitch.    He presented to his primary cardiologist complaining of rapid heart rates and significant fatigue and lack of energy.  He can do his daily activities but has to stop at the gym.  He has no syncope, but does have orthostatic dizziness at times.  He wore a cardiac monitor that showed episodes of nonsustained VT.  Discussed the use of AI scribe software for clinical note transcription with the patient, who gave verbal consent to proceed.  History of Present Illness   Spencer Cochran is a 78 year old male with atrial fibrillation who presents for evaluation of heart rhythm abnormalities. He was referred by Dr. Krasowski for evaluation of heart rhythm abnormalities.  He has atrial fibrillation and recently wore a heart monitor, which did not reveal any noticeable symptoms like irregular heartbeats, dizziness, or fatigue, except during physical activity. He takes carvedilol , 12.5 mg three times daily, and manages the dosing schedule with a timer. He experiences dizziness upon standing quickly, especially after prolonged sitting, which has improved with the current medication regimen. Approximately two months ago, he fell and sustained a mild concussion, but this incident does not appear to affect his current cardiac symptoms.       Review of systems complete and found to be negative unless listed in HPI.   EP Information / Studies Reviewed:    EKG is not ordered today. EKG from 08/11/2023 reviewed which showed atrial fibrillation        Risk  Assessment/Calculations:    CHA2DS2-VASc Score = 4   This indicates a 4.8% annual risk of stroke. The patient's score is based upon: CHF History: 0 HTN History: 1 Diabetes History: 0 Stroke History: 0 Vascular Disease History: 1 Age Score: 2 Gender Score: 0            Physical Exam:   VS:  BP 122/70   Pulse 84   Ht 5' 8 (1.727 m)   Wt 184 lb 3.2 oz (83.6 kg)   SpO2 96%   BMI 28.01 kg/m    Wt Readings from Last 3 Encounters:  11/08/23 184 lb 3.2 oz (83.6 kg)  08/11/23 182 lb (82.6 kg)  02/09/23 193 lb 6.4 oz (87.7 kg)     GEN: Well nourished, well developed in no acute distress NECK: No JVD; No carotid bruits CARDIAC: Irregularly irregular rate and rhythm, no murmurs, rubs, gallops RESPIRATORY:  Clear to auscultation without rales, wheezing or rhonchi  ABDOMEN: Soft, non-tender, non-distended EXTREMITIES:  No edema; No deformity   ASSESSMENT AND PLAN:    1.  Permanent atrial fibrillation: On carvedilol .  Rate is well-controlled.  2.  Secondary hypercoagulable state: On Eliquis   3.  Hypertension: Well-controlled  4.  Coronary artery disease: No current chest pain.  Plan per primary cardiology  5.  Nonsustained ventricular tachycardia: Has short episodes of ventricular tachycardia, longest 28 beats.  Patient's carvedilol  has been increased.  Lachina Salsberry adjust to 25 mg twice daily.  He has an upcoming echo.  If echo is without abnormality, would continue with this management.  Case discussed with primary cardiology  Follow up with Dr. Inocencio pending echo   Signed, Cheyne Boulden Gladis Inocencio, MD

## 2023-11-08 ENCOUNTER — Encounter: Payer: Self-pay | Admitting: Cardiology

## 2023-11-08 ENCOUNTER — Ambulatory Visit: Attending: Cardiology | Admitting: Cardiology

## 2023-11-08 VITALS — BP 122/70 | HR 84 | Ht 68.0 in | Wt 184.2 lb

## 2023-11-08 DIAGNOSIS — I10 Essential (primary) hypertension: Secondary | ICD-10-CM

## 2023-11-08 DIAGNOSIS — I4821 Permanent atrial fibrillation: Secondary | ICD-10-CM | POA: Diagnosis not present

## 2023-11-08 DIAGNOSIS — I472 Ventricular tachycardia, unspecified: Secondary | ICD-10-CM | POA: Diagnosis not present

## 2023-11-08 DIAGNOSIS — D6869 Other thrombophilia: Secondary | ICD-10-CM | POA: Diagnosis not present

## 2023-11-08 MED ORDER — CARVEDILOL 25 MG PO TABS: 25.0000 mg | ORAL_TABLET | Freq: Two times a day (BID) | ORAL | 3 refills | Status: AC

## 2023-11-08 NOTE — Patient Instructions (Signed)
 Medication Instructions:  Your physician has recommended you make the following change in your medication: CHANGE how you take Carvedilol  -- take 25 mg twice a day  *If you need a refill on your cardiac medications before your next appointment, please call your pharmacy*  Lab Work: None ordered  If you have any lab test that is abnormal or we need to change your treatment, we will call you to review the results.  Testing/Procedures: None ordered  Follow-Up: At [facility], you and your health needs are our priority.  As part of our continuing mission to provide you with exceptional heart care, our providers are all part of one team.  This team includes your primary Cardiologist (physician) and Advanced Practice Providers or APPs (Physician Assistants and Nurse Practitioners) who all work together to provide you with the care you need, when you need it.  Your next appointment:   to be determined after echocardiogram  Provider:   Soyla Norton, MD    Thank you for choosing [facility]!!   Maeola Domino, RN [phone]

## 2023-11-10 ENCOUNTER — Ambulatory Visit: Attending: Cardiology

## 2023-11-10 DIAGNOSIS — I4821 Permanent atrial fibrillation: Secondary | ICD-10-CM

## 2023-11-10 DIAGNOSIS — I2510 Atherosclerotic heart disease of native coronary artery without angina pectoris: Secondary | ICD-10-CM

## 2023-11-10 LAB — ECHOCARDIOGRAM COMPLETE
AR max vel: 2.11 cm2
AV Area VTI: 2.01 cm2
AV Area mean vel: 2.11 cm2
AV Mean grad: 8.8 mmHg
AV Peak grad: 15.4 mmHg
AV Vena cont: 0.2 cm
Ao pk vel: 1.97 m/s
Area-P 1/2: 4.24 cm2
MV M vel: 5.42 m/s
MV Peak grad: 117.5 mmHg
P 1/2 time: 731 ms
Radius: 0.7 cm
S' Lateral: 4.2 cm

## 2023-11-11 ENCOUNTER — Other Ambulatory Visit: Payer: Self-pay | Admitting: Cardiology

## 2023-11-11 DIAGNOSIS — N401 Enlarged prostate with lower urinary tract symptoms: Secondary | ICD-10-CM | POA: Diagnosis not present

## 2023-11-11 DIAGNOSIS — N529 Male erectile dysfunction, unspecified: Secondary | ICD-10-CM | POA: Diagnosis not present

## 2023-11-11 DIAGNOSIS — I4821 Permanent atrial fibrillation: Secondary | ICD-10-CM

## 2023-11-11 NOTE — Telephone Encounter (Signed)
 Prescription refill request for Eliquis  received. Indication:afib Last office visit:10/25 Scr:0.91  2025 Age: 78 Weight:83.6  kg  Prescription refilled

## 2023-11-17 ENCOUNTER — Ambulatory Visit: Attending: Cardiology | Admitting: Cardiology

## 2023-11-17 DIAGNOSIS — H52223 Regular astigmatism, bilateral: Secondary | ICD-10-CM | POA: Diagnosis not present

## 2023-11-17 DIAGNOSIS — Z9849 Cataract extraction status, unspecified eye: Secondary | ICD-10-CM | POA: Diagnosis not present

## 2023-11-17 DIAGNOSIS — H401132 Primary open-angle glaucoma, bilateral, moderate stage: Secondary | ICD-10-CM | POA: Diagnosis not present

## 2023-11-17 DIAGNOSIS — H5213 Myopia, bilateral: Secondary | ICD-10-CM | POA: Diagnosis not present

## 2023-11-17 DIAGNOSIS — H43813 Vitreous degeneration, bilateral: Secondary | ICD-10-CM | POA: Diagnosis not present

## 2023-11-17 DIAGNOSIS — H47233 Glaucomatous optic atrophy, bilateral: Secondary | ICD-10-CM | POA: Diagnosis not present

## 2023-11-17 DIAGNOSIS — Z961 Presence of intraocular lens: Secondary | ICD-10-CM | POA: Diagnosis not present

## 2023-11-17 DIAGNOSIS — H353121 Nonexudative age-related macular degeneration, left eye, early dry stage: Secondary | ICD-10-CM | POA: Diagnosis not present

## 2023-11-17 DIAGNOSIS — H3530 Unspecified macular degeneration: Secondary | ICD-10-CM | POA: Diagnosis not present

## 2023-11-26 ENCOUNTER — Ambulatory Visit: Attending: Cardiology | Admitting: Cardiology

## 2023-11-26 ENCOUNTER — Encounter: Payer: Self-pay | Admitting: Cardiology

## 2023-11-26 VITALS — BP 120/80 | HR 92 | Ht 68.0 in | Wt 188.0 lb

## 2023-11-26 DIAGNOSIS — I10 Essential (primary) hypertension: Secondary | ICD-10-CM

## 2023-11-26 DIAGNOSIS — E785 Hyperlipidemia, unspecified: Secondary | ICD-10-CM | POA: Diagnosis not present

## 2023-11-26 DIAGNOSIS — I2510 Atherosclerotic heart disease of native coronary artery without angina pectoris: Secondary | ICD-10-CM

## 2023-11-26 DIAGNOSIS — Z79899 Other long term (current) drug therapy: Secondary | ICD-10-CM | POA: Diagnosis not present

## 2023-11-26 DIAGNOSIS — I4821 Permanent atrial fibrillation: Secondary | ICD-10-CM

## 2023-11-26 NOTE — Patient Instructions (Signed)
Medication Instructions:  Your physician recommends that you continue on your current medications as directed. Please refer to the Current Medication list given to you today.  *If you need a refill on your cardiac medications before your next appointment, please call your pharmacy*   Lab Work: BMP today If you have labs (blood work) drawn today and your tests are completely normal, you will receive your results only by: MyChart Message (if you have MyChart) OR A paper copy in the mail If you have any lab test that is abnormal or we need to change your treatment, we will call you to review the results.   Testing/Procedures: None Ordered   Follow-Up: At [facility], you and your health needs are our priority.  As part of our continuing mission to provide you with exceptional heart care, we have created designated Provider Care Teams.  These Care Teams include your primary Cardiologist (physician) and Advanced Practice Providers (APPs -  Physician Assistants and Nurse Practitioners) who all work together to provide you with the care you need, when you need it.  We recommend signing up for the patient portal called "MyChart".  Sign up information is provided on this After Visit Summary.  MyChart is used to connect with patients for Virtual Visits (Telemedicine).  Patients are able to view lab/test results, encounter notes, upcoming appointments, etc.  Non-urgent messages can be sent to your provider as well.   To learn more about what you can do with MyChart, go to [email or web address].    Your next appointment:   3 month(s)  The format for your next appointment:   In Person  Provider:   Robert Krasowski, MD    Other Instructions NA  

## 2023-11-26 NOTE — Progress Notes (Signed)
 Cardiology Office Note:    Date:  11/26/2023   ID:  Spencer Cochran, DOB 02/09/1945, MRN [id number]  PCP:  Trinidad Glisson, MD  Cardiologist:  Lamar Fitch, MD    Referring MD: Trinidad Glisson, MD   Chief Complaint  Patient presents with   Follow-up    History of Present Illness:    Spencer Cochran is a 78 y.o. male past medical history significant for coronary artery disease luminal based on cardiac catheterization 3 years ago, stress test negative, permanent atrial fibrillation, anticoagulated, diastolic dysfunction, obstructive sleep apnea comes today to months for follow-up overall doing fine.  He said he has been very active he is awake sleeps in clinic he is gardening but he does not go to gym anymore.  Described to have some weakness fatigue.  He fell down few days ago and up going to the emergency room likely no subdural hematoma, he tripped on the cart and went back hitting his head on the table  Past Medical History:  Diagnosis Date   Arthritis knees and ankle   Asymptomatic gallstones    Atrial fibrillation (HCC) 03/08/2014   Benign neoplasm of colon 06/13/2020   Benign prostatic hyperplasia with incomplete bladder emptying 04/20/2013   IMO SNOMED Dx Update Oct 2024     Biliary obstruction (HCC)    Blood in urine 06/13/2020   Cancer (HCC)    skin    Choledocholithiasis    Coronary artery disease CARDIOLOGIST-  DR FITCH  JENNYE)---  LAST VISIT 04-29-2011   DENIES CARDIAC SYMPTOMS   Coronary artery disease involving native coronary artery of native heart without angina pectoris 10/09/2014   Luminal disease but cardiac catheterization from 2014, stress test in the summer of 2018 showed no evidence of ischemia  Formatting of this note might be different from the original. 20% circumflex 20% RCA in July 2014   COVID-19 virus infection 02/2020   DDD (degenerative disc disease), lumbar    Dyslipidemia 10/01/2016   Dysrhythmia    A Fib    Encounter for  fitting and adjustment of hearing aid 06/13/2020   Endocarditis 06/13/2020   Apr 25, 2007 Entered By: SAVANNAH MILLION C Comment: AORTIC STENOSIS, MITRAL AND TRICUSPID REGURGITATION   Flat foot (pes planus) (acquired), unspecified foot 07/02/2021   Frequency of urination    Gait abnormality 08/03/2018   GERD (gastroesophageal reflux disease)    History of colon polyps    History of kidney stones    Hyperlipidemia 01/21/2022   Hypertension    Impacted cerumen, left ear 10/09/2020   Impaired hearing has bilateral aids--  but does not wear at all times   Intolerance of continuous positive airway pressure (CPAP) ventilation 02/02/2023   Iron deficiency anemia 03/02/2022   Kidney stone 06/13/2020   Apr 25, 2007 Entered By: SAVANNAH MILLION C Comment: TREATED WITH LITHOTRIPSY   Left ventricular diastolic dysfunction PER ECHO [id number]  W/ CHART   Low back pain, unspecified 03/11/2021   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   Nocturia    Obstructive jaundice (HCC)    Obstructive sleep apnea 06/05/2019   Other specified counseling 10/09/2020   Palpitations 08/10/2019   Paresthesia 07/18/2018   Polyp of colon 01/21/2022   Positive fecal occult blood test 03/02/2022   Pre-diabetes    last hemaglobin a 1 c was 5.6   S/P insertion of hypoglossal nerve stimulator 02/02/2023   Sensorineural hearing loss, bilateral 06/13/2020   Tinnitus, bilateral 10/09/2020   Unsteadiness     Past Surgical History:  Procedure Laterality  Date   APPENDECTOMY  1972   BALLOON DILATION N/A 04/13/2019   Procedure: BALLOON DILATION;  Surgeon: Charlanne Groom, MD;  Location: [facility];  Service: Endoscopy;  Laterality: N/A;   CARDIOVASCULAR STRESS TEST  04-29-2010   DR KRASAWSKI   NO EVIDENCE ISCHEMIA/ NORMAL LVSF AND WALL MOTION/ EF 63%   CERVICAL FUSION  2006   C3 - 6   COLONOSCOPY  03/23/2012   Small colonic polyps, status post polypectomy. Pancolonic diverticulosis predominantly in the left colon.Small internal and  external hemorrhoids   COLONOSCOPY  2019   Dr Anette. Diverticulosis. Doesn't think he removed polpys    CYSTO/ URETHRAL DILATION/ TRANSURETHRAL INCISIONOF PROSTATE  01-13-2007   CYSTOSCOPY  2005   CYSTOSCOPY W/ RETROGRADES  06/12/2011   Procedure: CYSTOSCOPY WITH RETROGRADE PYELOGRAM;  Surgeon: Arlena LILLETTE Gal, MD;  Location: [facility];  Service: Urology;  Laterality: N/A;  cysto, urethral dilation, right retrograde pyelogram     CYSTOSCOPY WITH RETROGRADE PYELOGRAM, URETEROSCOPY AND STENT PLACEMENT Left 03/08/2014   Procedure: CYSTOSCOPY WITH LEFT RETROGRADE PYELOGRAM/LEFT  URETEROSCOPY;  Surgeon: Arlena LILLETTE Gal, MD;  Location: [facility];  Service: Urology;  Laterality: Left;   CYSTOSCOPY WITH URETHRAL DILATATION N/A 04/20/2013   Procedure: CYSTOSCOPY WITH URETHRAL DILATATION;  Surgeon: Arlena LILLETTE Gal, MD;  Location: [facility];  Service: Urology;  Laterality: N/A;   CYSTOSCOPY/URETEROSCOPY/HOLMIUM LASER/STENT PLACEMENT Left 06/10/2017   Procedure: CYSTOSCOPY/RETROGRADE/URETEROSCOPY/HOLMIUM LASER/STENT PLACEMENT;  Surgeon: Renda Glance, MD;  Location: [facility];  Service: Urology;  Laterality: Left;   CYSTOSCOPY/URETEROSCOPY/HOLMIUM LASER/STENT PLACEMENT Right 03/09/2019   Procedure: CYSTOSCOPY/RETROGRADE/URETEROSCOPY/HOLMIUM LASER/STENT PLACEMENT;  Surgeon: Renda Glance, MD;  Location: [facility];  Service: Urology;  Laterality: Right;  ONLY NEEDS 60 MIN   ENDOSCOPIC RETROGRADE CHOLANGIOPANCREATOGRAPHY (ERCP) WITH PROPOFOL  N/A 04/13/2019   Procedure: ENDOSCOPIC RETROGRADE CHOLANGIOPANCREATOGRAPHY (ERCP) WITH PROPOFOL ;  Surgeon: Charlanne Groom, MD;  Location: [facility];  Service: Endoscopy;  Laterality: N/A;   EXTRACORPOREAL SHOCK WAVE LITHOTRIPSY  01-17-2007   LEFT   HERNIA REPAIR  01/30/2019   [facility] Left inguinal hernia repair   HOLMIUM LASER APPLICATION Left 03/08/2014   Procedure: LASER OF LEFT RENAL PELVIC STONE;  Surgeon: Arlena LILLETTE Gal, MD;  Location: [facility];  Service: Urology;  Laterality: Left;   INGUINAL HERNIA REPAIR  2003   JOINT REPLACEMENT     total knee right 03-01-17   LEFT ACHILLES TENDON REPAIR  1992   MASS EXCISION N/A 11/14/2018   Procedure: EXCISIONAL BIOPSY OF GLANS PENIS;  Surgeon: Renda Glance, MD;  Location: [facility];  Service: Urology;  Laterality: N/A;  ONLY NEEDS 60 MIN   NASAL SINUS SURGERY  2012   PENILE SURGERY  OF MEATUS  1955   REMOVAL OF STONES  04/13/2019   Procedure: REMOVAL OF STONES;  Surgeon: Charlanne Groom, MD;  Location: [facility];  Service: Endoscopy;;   SPHINCTEROTOMY  04/13/2019   Procedure: ANNETT;  Surgeon: Charlanne Groom, MD;  Location: [facility];  Service: Endoscopy;;   TRANSTHORACIC ECHOCARDIOGRAM  03-31-2011   NORMAL LVEF  59%/ TRIVIAL MR/ DIASTOLIC DYSFUNCTION/ MODERATE LVH   TRANSURETHRAL RESECTION OF PROSTATE N/A 04/20/2013   Procedure: TRANSURETHRAL RESECTION OF THE PROSTATE WITH GYRUS INSTRUMENTS;  Surgeon: Arlena LILLETTE Gal, MD;  Location: [facility];  Service: Urology;  Laterality: N/A;    Current Medications: Current Meds  Medication Sig   atorvastatin  (LIPITOR) 20 MG tablet Take 1 tablet (20 mg total) by mouth at bedtime. Taking 20 MG Daily   benazepril  (LOTENSIN ) 20 MG  tablet Take 1 tablet (20 mg total) by mouth daily.   carvedilol  (COREG ) 25 MG tablet Take 1 tablet (25 mg total) by mouth 2 (two) times daily.   ELIQUIS  5 MG TABS tablet TAKE 1 TABLET(5 MG) BY MOUTH TWICE DAILY   furosemide  (LASIX ) 40 MG tablet Take 1 tablet (40 mg total) by mouth daily.   NON FORMULARY Take 1 Units by mouth in the morning and at bedtime. I health/macular   omeprazole (PRILOSEC) 20 MG capsule Take 20 mg by mouth daily.    tadalafil (CIALIS) 5 MG tablet Take 5 mg by mouth daily as needed for erectile dysfunction.   tamsulosin (FLOMAX) 0.4 MG CAPS capsule Take 0.4 mg by mouth daily.   timolol  (TIMOPTIC ) 0.5 % ophthalmic solution Place 1 drop into both eyes 2 (two)  times daily.    traZODone (DESYREL) 50 MG tablet Take 50 mg by mouth at bedtime.   Vitamin E (VITAMIN E/D-ALPHA NATURAL) 268 MG (400 UNIT) CAPS Take 1 capsule by mouth daily.     Allergies:   Patient has no known allergies.   Social History   Socioeconomic History   Marital status: Married    Spouse name: Not on file   Number of children: 2   Years of education: some college   Highest education level: Not on file  Occupational History   Occupation: retired Company secretary  Tobacco Use   Smoking status: Never   Smokeless tobacco: Never  Vaping Use   Vaping status: Never Used  Substance and Sexual Activity   Alcohol  use: No   Drug use: No   Sexual activity: Yes  Other Topics Concern   Not on file  Social History Narrative   Lives at home with his wife.   Right-handed.   Caffeine use:  32-40 ounces caffeine per day.   Social Drivers of Corporate investment banker Strain: Not on file  Food Insecurity: Not on file  Transportation Needs: Not on file  Physical Activity: Not on file  Stress: Not on file  Social Connections: Not on file     Family History: The patient's family history includes Breast cancer in his mother; Cirrhosis in his father; Colon cancer in his maternal aunt. There is no history of Esophageal cancer. ROS:   Please see the history of present illness.    All 14 point review of systems negative except as described per history of present illness  EKGs/Labs/Other Studies Reviewed:         Recent Labs: No results found for requested labs within last 365 days.  Recent Lipid Panel No results found for: CHOL, TRIG, HDL, CHOLHDL, VLDL, LDLCALC, LDLDIRECT  Physical Exam:    VS:  BP 120/80   Pulse 92   Ht 5' 8 (1.727 m)   Wt 188 lb (85.3 kg)   SpO2 98%   BMI 28.59 kg/m     Wt Readings from Last 3 Encounters:  11/26/23 188 lb (85.3 kg)  11/08/23 184 lb 3.2 oz (83.6 kg)  08/11/23 182 lb (82.6 kg)     GEN:  Well nourished, well  developed in no acute distress HEENT: Normal NECK: No JVD; No carotid bruits LYMPHATICS: No lymphadenopathy CARDIAC: RRR, no murmurs, no rubs, no gallops RESPIRATORY:  Clear to auscultation without rales, wheezing or rhonchi  ABDOMEN: Soft, non-tender, non-distended MUSCULOSKELETAL:  No edema; No deformity  SKIN: Warm and dry LOWER EXTREMITIES: no swelling NEUROLOGIC:  Alert and oriented x 3 PSYCHIATRIC:  Normal affect  ASSESSMENT:    1. Permanent atrial fibrillation (HCC)   2. Essential hypertension   3. Coronary artery disease involving native coronary artery of native heart without angina pectoris   4. Medication management   5. Dyslipidemia    PLAN:    In order of problems listed above:  Permanent atrial fibrillation rate controlled continue present management. Essential hypertension blood pressure well-controlled Coronary disease stable doing well from that point we will continue present management. His echocardiogram last time showed diminished ejection fraction I would like to switch him from ACE inhibitor to Entresto we will check Chem-7 if Chem-7 is fine we will switch him to [facility]   Medication Adjustments/Labs and Tests Ordered: Current medicines are reviewed at length with the patient today.  Concerns regarding medicines are outlined above.  Orders Placed This Encounter  Procedures   Basic metabolic panel with GFR   Medication changes: No orders of the defined types were placed in this encounter.   Signed, Lamar DOROTHA Fitch, MD, [facility] 11/26/2023 4:46 PM    [facility]

## 2023-11-27 LAB — BASIC METABOLIC PANEL WITH GFR
BUN/Creatinine Ratio: 26 — ABNORMAL HIGH (ref 10–24)
BUN: 21 mg/dL (ref 8–27)
CO2: 29 mmol/L (ref 20–29)
Calcium: 9.1 mg/dL (ref 8.6–10.2)
Chloride: 104 mmol/L (ref 96–106)
Creatinine, Ser: 0.82 mg/dL (ref 0.76–1.27)
Glucose: 107 mg/dL — ABNORMAL HIGH (ref 70–99)
Potassium: 3.4 mmol/L — ABNORMAL LOW (ref 3.5–5.2)
Sodium: 144 mmol/L (ref 134–144)
eGFR: 89 mL/min/1.73 (ref >59)

## 2023-11-29 ENCOUNTER — Ambulatory Visit: Payer: Self-pay | Admitting: Cardiology

## 2023-11-30 ENCOUNTER — Telehealth: Payer: Self-pay | Admitting: *Deleted

## 2023-11-30 NOTE — Telephone Encounter (Addendum)
 Pt made aware we will see him in 6 months. He is agreeable to plan.  Recall placed in epic

## 2023-11-30 NOTE — Telephone Encounter (Signed)
-----   Message from Will [place] Camnitz sent at 11/17/2023  8:55 AM EDT ----- Regarding: RE: Echo complete He is feeling okay on his higher dose of carvedilol , would make no further changes.  Can see him back in 6 months. ----- Message ----- From: Gretel Maeola CROME, RN Sent: 11/16/2023   8:05 AM EDT To: Soyla Gladis Norton, MD Subject: Echo complete                                  Echo complete -- please advise on follow up...SABRASABRA

## 2023-12-02 DIAGNOSIS — E785 Hyperlipidemia, unspecified: Secondary | ICD-10-CM | POA: Diagnosis not present

## 2023-12-02 DIAGNOSIS — K219 Gastro-esophageal reflux disease without esophagitis: Secondary | ICD-10-CM | POA: Diagnosis not present

## 2023-12-02 DIAGNOSIS — I771 Stricture of artery: Secondary | ICD-10-CM | POA: Diagnosis not present

## 2023-12-02 DIAGNOSIS — R32 Unspecified urinary incontinence: Secondary | ICD-10-CM | POA: Diagnosis not present

## 2023-12-02 DIAGNOSIS — E1122 Type 2 diabetes mellitus with diabetic chronic kidney disease: Secondary | ICD-10-CM | POA: Diagnosis not present

## 2023-12-02 DIAGNOSIS — I509 Heart failure, unspecified: Secondary | ICD-10-CM | POA: Diagnosis not present

## 2023-12-02 DIAGNOSIS — I4891 Unspecified atrial fibrillation: Secondary | ICD-10-CM | POA: Diagnosis not present

## 2023-12-02 DIAGNOSIS — E1151 Type 2 diabetes mellitus with diabetic peripheral angiopathy without gangrene: Secondary | ICD-10-CM | POA: Diagnosis not present

## 2023-12-02 DIAGNOSIS — I2510 Atherosclerotic heart disease of native coronary artery without angina pectoris: Secondary | ICD-10-CM | POA: Diagnosis not present

## 2023-12-09 MED ORDER — SACUBITRIL-VALSARTAN 24-26 MG PO TABS: 1.0000 | ORAL_TABLET | Freq: Two times a day (BID) | ORAL | 3 refills | Status: DC

## 2023-12-09 NOTE — Addendum Note (Signed)
 Addended by: ONEITA BERLINER on: 12/09/2023 09:48 AM   Modules accepted: Orders

## 2023-12-22 DIAGNOSIS — H401131 Primary open-angle glaucoma, bilateral, mild stage: Secondary | ICD-10-CM | POA: Diagnosis not present

## 2023-12-22 DIAGNOSIS — Z961 Presence of intraocular lens: Secondary | ICD-10-CM | POA: Diagnosis not present

## 2023-12-22 DIAGNOSIS — H43311 Vitreous membranes and strands, right eye: Secondary | ICD-10-CM | POA: Diagnosis not present

## 2023-12-22 DIAGNOSIS — H18413 Arcus senilis, bilateral: Secondary | ICD-10-CM | POA: Diagnosis not present

## 2023-12-28 ENCOUNTER — Ambulatory Visit (INDEPENDENT_AMBULATORY_CARE_PROVIDER_SITE_OTHER): Admitting: Podiatry

## 2023-12-28 DIAGNOSIS — M79674 Pain in right toe(s): Secondary | ICD-10-CM

## 2023-12-28 DIAGNOSIS — M79675 Pain in left toe(s): Secondary | ICD-10-CM

## 2023-12-28 DIAGNOSIS — B351 Tinea unguium: Secondary | ICD-10-CM | POA: Diagnosis not present

## 2023-12-28 DIAGNOSIS — D689 Coagulation defect, unspecified: Secondary | ICD-10-CM | POA: Diagnosis not present

## 2023-12-28 NOTE — Progress Notes (Unsigned)
  Subjective:  Patient ID: Spencer Cochran, male    DOB: Feb 12, 1945,  MRN: [id number]  Chief Complaint  Patient presents with   RFC    RFC no callous. Not diabetic and Eliquis .     78 y.o. male presents with the above complaint. History confirmed with patient. Patient presenting with pain related to dystrophic thickened elongated nails. Patient is unable to trim own nails related to nail dystrophy and/or mobility issues. Patient does not have a history of T2DM but is pre DM.  He is anticoagulated on Eliquis .  Objective:  Physical Exam: warm, good capillary refill nail exam onychomycosis of the toenails, onycholysis, and dystrophic nails DP pulses palpable, PT pulses palpable, and protective sensation intact Left Foot:  Pain with palpation of nails due to elongation and dystrophic growth.  Right Foot: Pain with palpation of nails due to elongation and dystrophic growth.   Assessment:   1. Pain due to onychomycosis of toenails of both feet   2. Coagulation defect         Plan:  Patient was evaluated and treated and all questions answered.  #Onychomycosis with pain  -Nails palliatively debrided as below. -Educated on self-care -Anticoagulated on eliquis   Procedure: Nail Debridement Rationale: Pain Type of Debridement: manual, sharp debridement. Instrumentation: Nail nipper, rotary burr. Number of Nails: 10   Return in about 3 months (around 03/29/2024) for Routine Foot Care.         Anatalia Kronk L. Lamount, DPM [facility] / [facility]

## 2023-12-30 ENCOUNTER — Encounter: Payer: Self-pay | Admitting: Podiatry

## 2024-01-03 ENCOUNTER — Telehealth: Payer: Self-pay

## 2024-01-03 DIAGNOSIS — H47233 Glaucomatous optic atrophy, bilateral: Secondary | ICD-10-CM | POA: Diagnosis not present

## 2024-01-03 DIAGNOSIS — H401132 Primary open-angle glaucoma, bilateral, moderate stage: Secondary | ICD-10-CM | POA: Diagnosis not present

## 2024-01-03 NOTE — Telephone Encounter (Signed)
 Pt viewed lab results on My Chart per Dr. Karry note. Routed to PCP.

## 2024-01-12 ENCOUNTER — Telehealth: Payer: Self-pay | Admitting: *Deleted

## 2024-01-12 MED ORDER — SACUBITRIL-VALSARTAN 24-26 MG PO TABS: 1.0000 | ORAL_TABLET | Freq: Two times a day (BID) | ORAL | 2 refills | Status: DC

## 2024-01-12 NOTE — Telephone Encounter (Signed)
 Rx refill sent to pharmacy.

## 2024-01-29 ENCOUNTER — Other Ambulatory Visit: Payer: Self-pay | Admitting: Cardiology

## 2024-02-23 ENCOUNTER — Encounter: Payer: Self-pay | Admitting: Cardiology

## 2024-02-23 ENCOUNTER — Ambulatory Visit: Attending: Cardiology | Admitting: Cardiology

## 2024-02-23 VITALS — BP 112/74 | HR 90 | Ht 68.0 in | Wt 188.0 lb

## 2024-02-23 DIAGNOSIS — I4821 Permanent atrial fibrillation: Secondary | ICD-10-CM

## 2024-02-23 DIAGNOSIS — E782 Mixed hyperlipidemia: Secondary | ICD-10-CM | POA: Diagnosis not present

## 2024-02-23 DIAGNOSIS — I2510 Atherosclerotic heart disease of native coronary artery without angina pectoris: Secondary | ICD-10-CM

## 2024-02-23 DIAGNOSIS — I10 Essential (primary) hypertension: Secondary | ICD-10-CM | POA: Diagnosis not present

## 2024-02-23 MED ORDER — SACUBITRIL-VALSARTAN 24-26 MG PO TABS: 1.0000 | ORAL_TABLET | Freq: Two times a day (BID) | ORAL | 2 refills | Status: AC

## 2024-02-23 NOTE — Patient Instructions (Addendum)
 Medication Instructions:  Your physician has recommended you make the following change in your medication:  Stop taking Benazapril  Cardiac Medications to continue: Eliquis  5 mg twice daily Atorvastatin  20 mg daily Carvedilol  25 mg twice daily Furosemide  40 mg daily Entresto  24-26 mg twice daily   *If you need a refill on your cardiac medications before your next appointment, please call your pharmacy*   Lab Work: Your physician recommends that you return for lab work in: 1 week BMP You need to have labs done when you are fasting.  You can come Monday through Friday 8:30 am to 12:00 pm and 1:15 to 4:30. You do not need to make an appointment as the order has already been placed.   If you have labs (blood work) drawn today and your tests are completely normal, you will receive your results only by: MyChart Message (if you have MyChart) OR A paper copy in the mail If you have any lab test that is abnormal or we need to change your treatment, we will call you to review the results.   Testing/Procedures: None ordered   Follow-Up: At [facility], you and your health needs are our priority.  As part of our continuing mission to provide you with exceptional heart care, we have created designated Provider Care Teams.  These Care Teams include your primary Cardiologist (physician) and Advanced Practice Providers (APPs -  Physician Assistants and Nurse Practitioners) who all work together to provide you with the care you need, when you need it.  We recommend signing up for the patient portal called MyChart.  Sign up information is provided on this After Visit Summary.  MyChart is used to connect with patients for Virtual Visits (Telemedicine).  Patients are able to view lab/test results, encounter notes, upcoming appointments, etc.  Non-urgent messages can be sent to your provider as well.   To learn more about what you can do with MyChart, go to [email or web address].    Your  next appointment:   6 month(s)  The format for your next appointment:   In Person  Provider:   Lamar Fitch, MD    Other Instructions none  Important Information About Sugar

## 2024-02-23 NOTE — Progress Notes (Signed)
 " Cardiology Office Note:    Date:  02/23/2024   ID:  Spencer Cochran, DOB Feb 26, 1945, MRN [id number]  PCP:  Trinidad Glisson, MD  Cardiologist:  Lamar Fitch, MD    Referring MD: Trinidad Glisson, MD   Chief Complaint  Patient presents with   Follow-up  Doing fine  History of Present Illness:     Spencer Cochran is a 79 y.o. male past medical history significant for coronary artery disease only luminal based on cardiac catheterization done 4 years ago, after that stress test negative, permanent atrial fibrillation, anticoagulated, diastolic dysfunction with recent echocardiogram also shows some diminished ejection fraction 4045%, he was switched to Entresto , obstructive sleep apnea.  Comes today to my office for follow-up overall doing well however he does admit that he does not go to gym on the regular basis because where is very cold he tried to exercise at home doing well  Past Medical History:  Diagnosis Date   Arthritis knees and ankle   Asymptomatic gallstones    Atrial fibrillation (HCC) 03/08/2014   Benign neoplasm of colon 06/13/2020   Benign prostatic hyperplasia with incomplete bladder emptying 04/20/2013   IMO SNOMED Dx Update Oct 2024     Biliary obstruction (HCC)    Blood in urine 06/13/2020   Cancer (HCC)    skin    Choledocholithiasis    Coronary artery disease CARDIOLOGIST-  DR FITCH  JENNYE)---  LAST VISIT 04-29-2011   DENIES CARDIAC SYMPTOMS   Coronary artery disease involving native coronary artery of native heart without angina pectoris 10/09/2014   Luminal disease but cardiac catheterization from 2014, stress test in the summer of 2018 showed no evidence of ischemia  Formatting of this note might be different from the original. 20% circumflex 20% RCA in July 2014   COVID-19 virus infection 02/2020   DDD (degenerative disc disease), lumbar    Dyslipidemia 10/01/2016   Dysrhythmia    A Fib    Encounter for fitting and adjustment of hearing aid  06/13/2020   Endocarditis 06/13/2020   Apr 25, 2007 Entered By: SAVANNAH MILLION C Comment: AORTIC STENOSIS, MITRAL AND TRICUSPID REGURGITATION   Flat foot (pes planus) (acquired), unspecified foot 07/02/2021   Frequency of urination    Gait abnormality 08/03/2018   GERD (gastroesophageal reflux disease)    History of colon polyps    History of kidney stones    Hyperlipidemia 01/21/2022   Hypertension    Impacted cerumen, left ear 10/09/2020   Impaired hearing has bilateral aids--  but does not wear at all times   Intolerance of continuous positive airway pressure (CPAP) ventilation 02/02/2023   Iron deficiency anemia 03/02/2022   Kidney stone 06/13/2020   Apr 25, 2007 Entered By: SAVANNAH MILLION C Comment: TREATED WITH LITHOTRIPSY   Left ventricular diastolic dysfunction PER ECHO [id number]  W/ CHART   Low back pain, unspecified 03/11/2021   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   Nocturia    Obstructive jaundice (HCC)    Obstructive sleep apnea 06/05/2019   Other specified counseling 10/09/2020   Palpitations 08/10/2019   Paresthesia 07/18/2018   Polyp of colon 01/21/2022   Positive fecal occult blood test 03/02/2022   Pre-diabetes    last hemaglobin a 1 c was 5.6   S/P insertion of hypoglossal nerve stimulator 02/02/2023   Sensorineural hearing loss, bilateral 06/13/2020   Tinnitus, bilateral 10/09/2020   Unsteadiness     Past Surgical History:  Procedure Laterality Date   APPENDECTOMY  1972   BALLOON DILATION N/A 04/13/2019  Procedure: BALLOON DILATION;  Surgeon: Charlanne Groom, MD;  Location: [facility];  Service: Endoscopy;  Laterality: N/A;   CARDIOVASCULAR STRESS TEST  04-29-2010   DR KRASAWSKI   NO EVIDENCE ISCHEMIA/ NORMAL LVSF AND WALL MOTION/ EF 63%   CERVICAL FUSION  2006   C3 - 6   COLONOSCOPY  03/23/2012   Small colonic polyps, status post polypectomy. Pancolonic diverticulosis predominantly in the left colon.Small internal and external hemorrhoids   COLONOSCOPY  2019    Dr Anette. Diverticulosis. Doesn't think he removed polpys    CYSTO/ URETHRAL DILATION/ TRANSURETHRAL INCISIONOF PROSTATE  01-13-2007   CYSTOSCOPY  2005   CYSTOSCOPY W/ RETROGRADES  06/12/2011   Procedure: CYSTOSCOPY WITH RETROGRADE PYELOGRAM;  Surgeon: Arlena LILLETTE Gal, MD;  Location[facility];  Service: Urology;  Laterality: N/A;  cysto, urethral dilation, right retrograde pyelogram     CYSTOSCOPY WITH RETROGRADE PYELOGRAM, URETEROSCOPY AND STENT PLACEMENT Left 03/08/2014   Procedure: CYSTOSCOPY WITH LEFT RETROGRADE PYELOGRAM/LEFT  URETEROSCOPY;  Surgeon: Arlena LILLETTE Gal, MD;  Location: [facility];  Service: Urology;  Laterality: Left;   CYSTOSCOPY WITH URETHRAL DILATATION N/A 04/20/2013   Procedure: CYSTOSCOPY WITH URETHRAL DILATATION;  Surgeon: Arlena LILLETTE Gal, MD;  Location: [facility];  Service: Urology;  Laterality: N/A;   CYSTOSCOPY/URETEROSCOPY/HOLMIUM LASER/STENT PLACEMENT Left 06/10/2017   Procedure: CYSTOSCOPY/RETROGRADE/URETEROSCOPY/HOLMIUM LASER/STENT PLACEMENT;  Surgeon: Renda Glance, MD;  Location: [facility];  Service: Urology;  Laterality: Left;   CYSTOSCOPY/URETEROSCOPY/HOLMIUM LASER/STENT PLACEMENT Right 03/09/2019   Procedure: CYSTOSCOPY/RETROGRADE/URETEROSCOPY/HOLMIUM LASER/STENT PLACEMENT;  Surgeon: Renda Glance, MD;  Location: [facility];  Service: Urology;  Laterality: Right;  ONLY NEEDS 60 MIN   ENDOSCOPIC RETROGRADE CHOLANGIOPANCREATOGRAPHY (ERCP) WITH PROPOFOL  N/A 04/13/2019   Procedure: ENDOSCOPIC RETROGRADE CHOLANGIOPANCREATOGRAPHY (ERCP) WITH PROPOFOL ;  Surgeon: Charlanne Groom, MD;  Location: [facility];  Service: Endoscopy;  Laterality: N/A;   EXTRACORPOREAL SHOCK WAVE LITHOTRIPSY  01-17-2007   LEFT   HERNIA REPAIR  01/30/2019   [facility] Left inguinal hernia repair   HOLMIUM LASER APPLICATION Left 03/08/2014   Procedure: LASER OF LEFT RENAL PELVIC STONE;  Surgeon: Arlena LILLETTE Gal, MD;  Location: [facility];  Service:  Urology;  Laterality: Left;   INGUINAL HERNIA REPAIR  2003   JOINT REPLACEMENT     total knee right 03-01-17   LEFT ACHILLES TENDON REPAIR  1992   MASS EXCISION N/A 11/14/2018   Procedure: EXCISIONAL BIOPSY OF GLANS PENIS;  Surgeon: Renda Glance, MD;  Location: [facility];  Service: Urology;  Laterality: N/A;  ONLY NEEDS 60 MIN   NASAL SINUS SURGERY  2012   PENILE SURGERY  OF MEATUS  1955   REMOVAL OF STONES  04/13/2019   Procedure: REMOVAL OF STONES;  Surgeon: Charlanne Groom, MD;  Location: [facility];  Service: Endoscopy;;   SPHINCTEROTOMY  04/13/2019   Procedure: ANNETT;  Surgeon: Charlanne Groom, MD;  Location: [facility];  Service: Endoscopy;;   TRANSTHORACIC ECHOCARDIOGRAM  03-31-2011   NORMAL LVEF  59%/ TRIVIAL MR/ DIASTOLIC DYSFUNCTION/ MODERATE LVH   TRANSURETHRAL RESECTION OF PROSTATE N/A 04/20/2013   Procedure: TRANSURETHRAL RESECTION OF THE PROSTATE WITH GYRUS INSTRUMENTS;  Surgeon: Arlena LILLETTE Gal, MD;  Location: [facility];  Service: Urology;  Laterality: N/A;    Current Medications: Active Medications[1]   Allergies:   Patient has no known allergies.   Social History   Socioeconomic History   Marital status: Married    Spouse name: Not on file   Number of children: 2   Years of education: some college  Highest education level: Not on file  Occupational History   Occupation: retired Company Secretary  Tobacco Use   Smoking status: Never   Smokeless tobacco: Never  Vaping Use   Vaping status: Never Used  Substance and Sexual Activity   Alcohol  use: No   Drug use: No   Sexual activity: Yes  Other Topics Concern   Not on file  Social History Narrative   Lives at home with his wife.   Right-handed.   Caffeine use:  32-40 ounces caffeine per day.   Social Drivers of Health   Tobacco Use: Low Risk (02/23/2024)   Patient History    Smoking Tobacco Use: Never    Smokeless Tobacco Use: Never    Passive Exposure: Not on file  Financial Resource Strain: Not on  file  Food Insecurity: Not on file  Transportation Needs: Not on file  Physical Activity: Not on file  Stress: Not on file  Social Connections: Not on file  Depression (EYV7-0): Not on file  Alcohol  Screen: Not on file  Housing: Not on file  Utilities: Not on file  Health Literacy: Not on file     Family History: The patient's family history includes Breast cancer in his mother; Cirrhosis in his father; Colon cancer in his maternal aunt. There is no history of Esophageal cancer. ROS:   Please see the history of present illness.    All 14 point review of systems negative except as described per history of present illness  EKGs/Labs/Other Studies Reviewed:         Recent Labs: 11/26/2023: BUN 21; Creatinine, Ser 0.82; Potassium 3.4; Sodium 144  Recent Lipid Panel No results found for: CHOL, TRIG, HDL, CHOLHDL, VLDL, LDLCALC, LDLDIRECT  Physical Exam:    VS:  BP 112/74   Pulse 90   Ht 5' 8 (1.727 m)   Wt 188 lb (85.3 kg)   SpO2 97%   BMI 28.59 kg/m     Wt Readings from Last 3 Encounters:  02/23/24 188 lb (85.3 kg)  11/26/23 188 lb (85.3 kg)  11/08/23 184 lb 3.2 oz (83.6 kg)     GEN:  Well nourished, well developed in no acute distress HEENT: Normal NECK: No JVD; No carotid bruits LYMPHATICS: No lymphadenopathy CARDIAC: RRR, no murmurs, no rubs, no gallops RESPIRATORY:  Clear to auscultation without rales, wheezing or rhonchi  ABDOMEN: Soft, non-tender, non-distended MUSCULOSKELETAL:  No edema; No deformity  SKIN: Warm and dry LOWER EXTREMITIES: no swelling NEUROLOGIC:  Alert and oriented x 3 PSYCHIATRIC:  Normal affect   ASSESSMENT:    1. Coronary artery disease involving native coronary artery of native heart without angina pectoris   2. Permanent atrial fibrillation (HCC)   3. Primary hypertension   4. Mixed hyperlipidemia    PLAN:    In order of problems listed above:  Coronary disease: Stable from that point review, denies have any  chest pain tightness squeezing pressure burning chest. Permanent atrial fibrillation rate controlled continue anticoagulation. Essential hypertension blood pressure well-controlled. Mixed dyslipidemia, he is taking atorvastatin  20 mg daily I did review [facility] which show me data from September of last year LDL 68 HDL 42. Medications issues he is confused about what medication he takes we gave him a list of medication he need to be taken today   Medication Adjustments/Labs and Tests Ordered: Current medicines are reviewed at length with the patient today.  Concerns regarding medicines are outlined above.  No orders of the defined types were placed in  this encounter.  Medication changes: No orders of the defined types were placed in this encounter.   Signed, Lamar DOROTHA Fitch, MD, [facility] 02/23/2024 11:00 AM    [facility]    [1]  Current Meds  Medication Sig   atorvastatin  (LIPITOR) 20 MG tablet Take 1 tablet (20 mg total) by mouth at bedtime. Taking 20 MG Daily   benazepril  (LOTENSIN ) 20 MG tablet Take 20 mg by mouth daily.   carvedilol  (COREG ) 25 MG tablet Take 1 tablet (25 mg total) by mouth 2 (two) times daily.   ELIQUIS  5 MG TABS tablet TAKE 1 TABLET(5 MG) BY MOUTH TWICE DAILY   furosemide  (LASIX ) 40 MG tablet Take 1 tablet (40 mg total) by mouth daily.   NON FORMULARY Take 1 Units by mouth in the morning and at bedtime. I health/macular   omeprazole (PRILOSEC) 20 MG capsule Take 20 mg by mouth daily.    tadalafil (CIALIS) 5 MG tablet Take 5 mg by mouth daily as needed for erectile dysfunction.   tamsulosin (FLOMAX) 0.4 MG CAPS capsule Take 0.4 mg by mouth daily.   timolol  (TIMOPTIC ) 0.5 % ophthalmic solution Place 1 drop into both eyes 2 (two) times daily.    traZODone (DESYREL) 50 MG tablet Take 50 mg by mouth at bedtime.   Vitamin E (VITAMIN E/D-ALPHA NATURAL) 268 MG (400 UNIT) CAPS Take 1 capsule by mouth daily.   "

## 2024-02-23 NOTE — Addendum Note (Signed)
 Addended by: GLENFORD ALAN CROME on: 02/23/2024 11:09 AM   Modules accepted: Orders

## 2024-02-25 ENCOUNTER — Ambulatory Visit: Admitting: Cardiology

## 2024-03-09 ENCOUNTER — Telehealth

## 2024-03-29 ENCOUNTER — Ambulatory Visit: Admitting: Podiatry
